# Patient Record
Sex: Female | Born: 1970
Health system: Southern US, Community
[De-identification: ages and names within clinical notes are randomized; demographics above are authoritative.]

## PROBLEM LIST (undated history)

## (undated) DIAGNOSIS — F419 Anxiety disorder, unspecified: Secondary | ICD-10-CM

## (undated) DIAGNOSIS — M797 Fibromyalgia: Secondary | ICD-10-CM

## (undated) DIAGNOSIS — B2790 Infectious mononucleosis, unspecified without complication: Secondary | ICD-10-CM

## (undated) DIAGNOSIS — Z8616 Personal history of COVID-19: Secondary | ICD-10-CM

## (undated) DIAGNOSIS — D849 Immunodeficiency, unspecified: Secondary | ICD-10-CM

## (undated) DIAGNOSIS — Z9889 Other specified postprocedural states: Secondary | ICD-10-CM

## (undated) DIAGNOSIS — N83209 Unspecified ovarian cyst, unspecified side: Secondary | ICD-10-CM

## (undated) DIAGNOSIS — I809 Phlebitis and thrombophlebitis of unspecified site: Secondary | ICD-10-CM

## (undated) DIAGNOSIS — A498 Other bacterial infections of unspecified site: Secondary | ICD-10-CM

## (undated) DIAGNOSIS — A6920 Lyme disease, unspecified: Secondary | ICD-10-CM

## (undated) DIAGNOSIS — J45909 Unspecified asthma, uncomplicated: Secondary | ICD-10-CM

## (undated) DIAGNOSIS — E079 Disorder of thyroid, unspecified: Secondary | ICD-10-CM

## (undated) DIAGNOSIS — N3010 Interstitial cystitis (chronic) without hematuria: Secondary | ICD-10-CM

## (undated) DIAGNOSIS — R011 Cardiac murmur, unspecified: Secondary | ICD-10-CM

## (undated) DIAGNOSIS — R112 Nausea with vomiting, unspecified: Secondary | ICD-10-CM

## (undated) DIAGNOSIS — I10 Essential (primary) hypertension: Secondary | ICD-10-CM

## (undated) DIAGNOSIS — Z77120 Contact with and (suspected) exposure to mold (toxic): Secondary | ICD-10-CM

## (undated) DIAGNOSIS — K219 Gastro-esophageal reflux disease without esophagitis: Secondary | ICD-10-CM

## (undated) DIAGNOSIS — R0602 Shortness of breath: Secondary | ICD-10-CM

## (undated) DIAGNOSIS — I499 Cardiac arrhythmia, unspecified: Secondary | ICD-10-CM

## (undated) DIAGNOSIS — E039 Hypothyroidism, unspecified: Secondary | ICD-10-CM

## (undated) HISTORY — DX: Contact with and (suspected) exposure to mold (toxic): Z77.120

## (undated) HISTORY — DX: Disorder of thyroid, unspecified: E07.9

## (undated) HISTORY — DX: Infectious mononucleosis, unspecified without complication: B27.90

## (undated) HISTORY — DX: Cardiac arrhythmia, unspecified: I49.9

## (undated) HISTORY — DX: Phlebitis and thrombophlebitis of unspecified site: I80.9

## (undated) HISTORY — PX: CHOLECYSTECTOMY: SHX55

## (undated) HISTORY — DX: Unspecified asthma, uncomplicated: J45.909

## (undated) HISTORY — DX: Immunodeficiency, unspecified: D84.9

## (undated) HISTORY — PX: BLADDER SURGERY: SHX569

## (undated) HISTORY — DX: Other bacterial infections of unspecified site: A49.8

## (undated) HISTORY — DX: Cardiac murmur, unspecified: R01.1

## (undated) HISTORY — DX: Anxiety disorder, unspecified: F41.9

## (undated) HISTORY — DX: Essential (primary) hypertension: I10

## (undated) HISTORY — DX: Gastro-esophageal reflux disease without esophagitis: K21.9

## (undated) HISTORY — PX: OTHER SURGICAL HISTORY: SHX169

## (undated) HISTORY — PX: PELVIC LAPAROSCOPY: SHX162

## (undated) HISTORY — DX: Personal history of COVID-19: Z86.16

## (undated) HISTORY — DX: Interstitial cystitis (chronic) without hematuria: N30.10

## (undated) HISTORY — DX: Unspecified ovarian cyst, unspecified side: N83.209

---

## 2000-02-23 ENCOUNTER — Other Ambulatory Visit: Admission: RE | Admit: 2000-02-23 | Discharge: 2000-02-23 | Payer: Self-pay | Admitting: Internal Medicine

## 2000-02-23 ENCOUNTER — Encounter (INDEPENDENT_AMBULATORY_CARE_PROVIDER_SITE_OTHER): Payer: Self-pay | Admitting: Specialist

## 2000-03-03 ENCOUNTER — Emergency Department (HOSPITAL_COMMUNITY): Admission: EM | Admit: 2000-03-03 | Discharge: 2000-03-03 | Payer: Self-pay | Admitting: Emergency Medicine

## 2000-03-03 ENCOUNTER — Encounter: Payer: Self-pay | Admitting: Emergency Medicine

## 2000-03-20 ENCOUNTER — Ambulatory Visit (HOSPITAL_BASED_OUTPATIENT_CLINIC_OR_DEPARTMENT_OTHER): Admission: RE | Admit: 2000-03-20 | Discharge: 2000-03-20 | Payer: Self-pay | Admitting: *Deleted

## 2000-05-21 ENCOUNTER — Encounter: Payer: Self-pay | Admitting: Surgery

## 2000-05-21 ENCOUNTER — Encounter: Admission: RE | Admit: 2000-05-21 | Discharge: 2000-05-21 | Payer: Self-pay | Admitting: Surgery

## 2001-08-26 ENCOUNTER — Ambulatory Visit (HOSPITAL_COMMUNITY): Admission: RE | Admit: 2001-08-26 | Discharge: 2001-08-26 | Payer: Self-pay | Admitting: Oral & Maxillofacial Surgery

## 2001-08-26 ENCOUNTER — Encounter: Payer: Self-pay | Admitting: Oral & Maxillofacial Surgery

## 2001-11-08 ENCOUNTER — Ambulatory Visit (HOSPITAL_BASED_OUTPATIENT_CLINIC_OR_DEPARTMENT_OTHER): Admission: RE | Admit: 2001-11-08 | Discharge: 2001-11-08 | Payer: Self-pay | Admitting: Family Medicine

## 2002-07-30 ENCOUNTER — Other Ambulatory Visit: Admission: RE | Admit: 2002-07-30 | Discharge: 2002-07-30 | Payer: Self-pay | Admitting: Obstetrics and Gynecology

## 2002-08-04 ENCOUNTER — Encounter: Payer: Self-pay | Admitting: Family Medicine

## 2002-08-04 LAB — CONVERTED CEMR LAB

## 2003-03-30 ENCOUNTER — Ambulatory Visit (HOSPITAL_BASED_OUTPATIENT_CLINIC_OR_DEPARTMENT_OTHER): Admission: RE | Admit: 2003-03-30 | Discharge: 2003-03-30 | Payer: Self-pay | Admitting: Otolaryngology

## 2003-03-30 ENCOUNTER — Encounter (INDEPENDENT_AMBULATORY_CARE_PROVIDER_SITE_OTHER): Payer: Self-pay | Admitting: Specialist

## 2004-03-14 ENCOUNTER — Encounter: Payer: Self-pay | Admitting: Internal Medicine

## 2004-12-02 ENCOUNTER — Ambulatory Visit: Payer: Self-pay | Admitting: Internal Medicine

## 2004-12-06 ENCOUNTER — Ambulatory Visit: Payer: Self-pay | Admitting: Internal Medicine

## 2004-12-07 ENCOUNTER — Ambulatory Visit: Payer: Self-pay | Admitting: Family Medicine

## 2004-12-08 ENCOUNTER — Encounter: Admission: RE | Admit: 2004-12-08 | Discharge: 2004-12-08 | Payer: Self-pay | Admitting: Family Medicine

## 2004-12-22 ENCOUNTER — Ambulatory Visit (HOSPITAL_COMMUNITY): Admission: RE | Admit: 2004-12-22 | Discharge: 2004-12-22 | Payer: Self-pay | Admitting: Family Medicine

## 2005-06-26 ENCOUNTER — Ambulatory Visit: Payer: Self-pay | Admitting: Family Medicine

## 2005-08-30 ENCOUNTER — Ambulatory Visit: Payer: Self-pay | Admitting: Internal Medicine

## 2005-09-05 ENCOUNTER — Ambulatory Visit: Payer: Self-pay | Admitting: Internal Medicine

## 2005-10-16 ENCOUNTER — Ambulatory Visit: Payer: Self-pay | Admitting: Internal Medicine

## 2005-11-14 ENCOUNTER — Ambulatory Visit: Payer: Self-pay | Admitting: Family Medicine

## 2005-12-15 ENCOUNTER — Ambulatory Visit: Payer: Self-pay | Admitting: Internal Medicine

## 2005-12-20 ENCOUNTER — Ambulatory Visit: Payer: Self-pay | Admitting: Internal Medicine

## 2006-04-04 ENCOUNTER — Ambulatory Visit: Payer: Self-pay | Admitting: Family Medicine

## 2006-05-03 ENCOUNTER — Ambulatory Visit: Payer: Self-pay | Admitting: Family Medicine

## 2006-05-07 ENCOUNTER — Ambulatory Visit: Payer: Self-pay | Admitting: Family Medicine

## 2006-05-08 ENCOUNTER — Ambulatory Visit: Payer: Self-pay | Admitting: Family Medicine

## 2006-05-22 ENCOUNTER — Ambulatory Visit: Payer: Self-pay | Admitting: Family Medicine

## 2006-07-20 ENCOUNTER — Ambulatory Visit: Payer: Self-pay | Admitting: Family Medicine

## 2006-08-30 ENCOUNTER — Ambulatory Visit: Payer: Self-pay | Admitting: Urology

## 2006-08-31 ENCOUNTER — Ambulatory Visit: Payer: Self-pay | Admitting: Urology

## 2006-09-10 ENCOUNTER — Other Ambulatory Visit: Admission: RE | Admit: 2006-09-10 | Discharge: 2006-09-10 | Payer: Self-pay | Admitting: Obstetrics and Gynecology

## 2006-10-18 ENCOUNTER — Ambulatory Visit (HOSPITAL_COMMUNITY): Admission: RE | Admit: 2006-10-18 | Discharge: 2006-10-18 | Payer: Self-pay | Admitting: Surgery

## 2006-11-30 ENCOUNTER — Encounter: Payer: Self-pay | Admitting: Family Medicine

## 2006-11-30 DIAGNOSIS — IMO0001 Reserved for inherently not codable concepts without codable children: Secondary | ICD-10-CM | POA: Insufficient documentation

## 2006-11-30 DIAGNOSIS — J45909 Unspecified asthma, uncomplicated: Secondary | ICD-10-CM | POA: Insufficient documentation

## 2006-11-30 DIAGNOSIS — B369 Superficial mycosis, unspecified: Secondary | ICD-10-CM | POA: Insufficient documentation

## 2006-11-30 DIAGNOSIS — R519 Headache, unspecified: Secondary | ICD-10-CM | POA: Insufficient documentation

## 2006-11-30 DIAGNOSIS — R5382 Chronic fatigue, unspecified: Secondary | ICD-10-CM | POA: Insufficient documentation

## 2006-11-30 DIAGNOSIS — E785 Hyperlipidemia, unspecified: Secondary | ICD-10-CM | POA: Insufficient documentation

## 2006-11-30 DIAGNOSIS — N3010 Interstitial cystitis (chronic) without hematuria: Secondary | ICD-10-CM | POA: Insufficient documentation

## 2006-11-30 DIAGNOSIS — K589 Irritable bowel syndrome without diarrhea: Secondary | ICD-10-CM | POA: Insufficient documentation

## 2006-11-30 DIAGNOSIS — K219 Gastro-esophageal reflux disease without esophagitis: Secondary | ICD-10-CM | POA: Insufficient documentation

## 2006-11-30 DIAGNOSIS — F410 Panic disorder [episodic paroxysmal anxiety] without agoraphobia: Secondary | ICD-10-CM | POA: Insufficient documentation

## 2006-11-30 DIAGNOSIS — G9332 Myalgic encephalomyelitis/chronic fatigue syndrome: Secondary | ICD-10-CM | POA: Insufficient documentation

## 2006-11-30 DIAGNOSIS — R51 Headache: Secondary | ICD-10-CM | POA: Insufficient documentation

## 2007-03-11 ENCOUNTER — Encounter (INDEPENDENT_AMBULATORY_CARE_PROVIDER_SITE_OTHER): Payer: Self-pay | Admitting: Surgery

## 2007-03-11 ENCOUNTER — Ambulatory Visit (HOSPITAL_COMMUNITY): Admission: RE | Admit: 2007-03-11 | Discharge: 2007-03-12 | Payer: Self-pay | Admitting: Surgery

## 2007-03-11 ENCOUNTER — Encounter: Payer: Self-pay | Admitting: Family Medicine

## 2007-03-19 ENCOUNTER — Telehealth (INDEPENDENT_AMBULATORY_CARE_PROVIDER_SITE_OTHER): Payer: Self-pay | Admitting: *Deleted

## 2007-04-29 ENCOUNTER — Ambulatory Visit: Payer: Self-pay | Admitting: Internal Medicine

## 2007-09-05 HISTORY — PX: TONSILLECTOMY: SUR1361

## 2007-09-30 ENCOUNTER — Other Ambulatory Visit: Admission: RE | Admit: 2007-09-30 | Discharge: 2007-09-30 | Payer: Self-pay | Admitting: Obstetrics and Gynecology

## 2007-12-05 DIAGNOSIS — D126 Benign neoplasm of colon, unspecified: Secondary | ICD-10-CM | POA: Insufficient documentation

## 2008-08-31 ENCOUNTER — Ambulatory Visit (HOSPITAL_BASED_OUTPATIENT_CLINIC_OR_DEPARTMENT_OTHER): Admission: RE | Admit: 2008-08-31 | Discharge: 2008-08-31 | Payer: Self-pay | Admitting: Otolaryngology

## 2008-11-30 ENCOUNTER — Ambulatory Visit: Payer: Self-pay | Admitting: Obstetrics and Gynecology

## 2008-11-30 ENCOUNTER — Encounter: Payer: Self-pay | Admitting: Obstetrics and Gynecology

## 2008-11-30 ENCOUNTER — Other Ambulatory Visit: Admission: RE | Admit: 2008-11-30 | Discharge: 2008-11-30 | Payer: Self-pay | Admitting: Obstetrics and Gynecology

## 2009-01-02 DIAGNOSIS — B2790 Infectious mononucleosis, unspecified without complication: Secondary | ICD-10-CM

## 2009-01-02 HISTORY — DX: Infectious mononucleosis, unspecified without complication: B27.90

## 2009-01-03 ENCOUNTER — Ambulatory Visit: Payer: Self-pay | Admitting: Diagnostic Radiology

## 2009-01-03 ENCOUNTER — Encounter: Payer: Self-pay | Admitting: Emergency Medicine

## 2009-01-04 ENCOUNTER — Inpatient Hospital Stay (HOSPITAL_COMMUNITY): Admission: EM | Admit: 2009-01-04 | Discharge: 2009-01-08 | Payer: Self-pay | Admitting: Internal Medicine

## 2009-01-08 ENCOUNTER — Encounter: Payer: Self-pay | Admitting: Physician Assistant

## 2009-01-22 ENCOUNTER — Encounter: Admission: RE | Admit: 2009-01-22 | Discharge: 2009-01-22 | Payer: Self-pay | Admitting: Internal Medicine

## 2009-02-02 ENCOUNTER — Encounter: Payer: Self-pay | Admitting: *Deleted

## 2009-02-18 ENCOUNTER — Encounter (INDEPENDENT_AMBULATORY_CARE_PROVIDER_SITE_OTHER): Payer: Self-pay | Admitting: *Deleted

## 2009-03-09 ENCOUNTER — Encounter: Payer: Self-pay | Admitting: Physician Assistant

## 2009-03-10 ENCOUNTER — Encounter: Payer: Self-pay | Admitting: Infectious Diseases

## 2009-03-10 DIAGNOSIS — B2790 Infectious mononucleosis, unspecified without complication: Secondary | ICD-10-CM | POA: Insufficient documentation

## 2009-03-10 DIAGNOSIS — R000 Tachycardia, unspecified: Secondary | ICD-10-CM | POA: Insufficient documentation

## 2009-03-11 ENCOUNTER — Encounter: Admission: RE | Admit: 2009-03-11 | Discharge: 2009-03-11 | Payer: Self-pay | Admitting: Allergy

## 2009-04-13 ENCOUNTER — Telehealth: Payer: Self-pay | Admitting: Internal Medicine

## 2009-04-15 ENCOUNTER — Encounter: Payer: Self-pay | Admitting: Internal Medicine

## 2009-04-15 ENCOUNTER — Ambulatory Visit: Payer: Self-pay | Admitting: Gastroenterology

## 2009-04-15 DIAGNOSIS — R1013 Epigastric pain: Secondary | ICD-10-CM | POA: Insufficient documentation

## 2009-04-15 DIAGNOSIS — R110 Nausea: Secondary | ICD-10-CM | POA: Insufficient documentation

## 2009-04-15 LAB — CONVERTED CEMR LAB
Lipase: 14 units/L (ref 11.0–59.0)
Tissue Transglutaminase Ab, IgA: 0.5 units (ref <7)

## 2009-04-19 ENCOUNTER — Ambulatory Visit: Payer: Self-pay | Admitting: Infectious Diseases

## 2009-04-19 LAB — CONVERTED CEMR LAB
ALT: 13 units/L (ref 0–35)
AST: 14 units/L (ref 0–37)
Albumin: 3.6 g/dL (ref 3.5–5.2)
Alkaline Phosphatase: 56 units/L (ref 39–117)
Anti Nuclear Antibody(ANA): NEGATIVE
BUN: 11 mg/dL (ref 6–23)
Basophils Absolute: 0 10*3/uL (ref 0.0–0.1)
Basophils Relative: 0 % (ref 0–1)
CO2: 24 meq/L (ref 19–32)
Calcium: 8.8 mg/dL (ref 8.4–10.5)
Chloride: 107 meq/L (ref 96–112)
Creatinine, Ser: 0.67 mg/dL (ref 0.40–1.20)
EBV NA IgG: 0.24
EBV VCA IgG: 4.08 — ABNORMAL HIGH
EBV VCA IgM: 0.14
Eosinophils Absolute: 0.3 10*3/uL (ref 0.0–0.7)
Eosinophils Relative: 3 % (ref 0–5)
Glucose, Bld: 90 mg/dL (ref 70–99)
HCT: 36.4 % (ref 36.0–46.0)
Hemoglobin: 11.2 g/dL — ABNORMAL LOW (ref 12.0–15.0)
Lymphocytes Relative: 28 % (ref 12–46)
Lymphs Abs: 2.9 10*3/uL (ref 0.7–4.0)
MCHC: 30.8 g/dL (ref 30.0–36.0)
MCV: 82.9 fL (ref >=78.0)
Monocytes Absolute: 0.5 10*3/uL (ref 0.1–1.0)
Monocytes Relative: 5 % (ref 3–12)
Neutro Abs: 6.5 10*3/uL (ref 1.7–7.7)
Neutrophils Relative %: 64 % (ref 43–77)
Platelets: 353 10*3/uL (ref 150–400)
Potassium: 3.8 meq/L (ref 3.5–5.3)
RBC: 4.39 M/uL (ref 3.87–5.11)
RDW: 14.8 % (ref 11.5–15.5)
Sed Rate: 13 mm/hr (ref 0–22)
Sodium: 142 meq/L (ref 135–145)
TSH: 0.892 microintl units/mL (ref 0.350–4.5)
Total Bilirubin: 0.3 mg/dL (ref 0.3–1.2)
Total Protein: 6.9 g/dL (ref 6.0–8.3)
WBC: 10.2 10*3/uL (ref 4.0–10.5)

## 2009-04-22 ENCOUNTER — Ambulatory Visit (HOSPITAL_COMMUNITY): Admission: RE | Admit: 2009-04-22 | Discharge: 2009-04-22 | Payer: Self-pay | Admitting: Gastroenterology

## 2009-04-27 ENCOUNTER — Telehealth: Payer: Self-pay | Admitting: Physician Assistant

## 2009-04-28 ENCOUNTER — Encounter: Payer: Self-pay | Admitting: Physician Assistant

## 2009-04-30 ENCOUNTER — Encounter: Payer: Self-pay | Admitting: Internal Medicine

## 2009-04-30 ENCOUNTER — Telehealth: Payer: Self-pay | Admitting: Internal Medicine

## 2009-04-30 ENCOUNTER — Ambulatory Visit: Payer: Self-pay | Admitting: Internal Medicine

## 2009-05-03 ENCOUNTER — Encounter: Payer: Self-pay | Admitting: Internal Medicine

## 2009-05-04 ENCOUNTER — Telehealth: Payer: Self-pay | Admitting: Internal Medicine

## 2009-05-06 ENCOUNTER — Telehealth (INDEPENDENT_AMBULATORY_CARE_PROVIDER_SITE_OTHER): Payer: Self-pay | Admitting: *Deleted

## 2009-05-12 ENCOUNTER — Ambulatory Visit: Payer: Self-pay | Admitting: Infectious Diseases

## 2009-05-12 DIAGNOSIS — G4700 Insomnia, unspecified: Secondary | ICD-10-CM | POA: Insufficient documentation

## 2009-06-15 ENCOUNTER — Ambulatory Visit: Payer: Self-pay | Admitting: Internal Medicine

## 2009-06-15 DIAGNOSIS — K3184 Gastroparesis: Secondary | ICD-10-CM | POA: Insufficient documentation

## 2009-06-22 ENCOUNTER — Telehealth: Payer: Self-pay | Admitting: Internal Medicine

## 2009-06-30 ENCOUNTER — Telehealth: Payer: Self-pay | Admitting: Internal Medicine

## 2009-07-12 ENCOUNTER — Ambulatory Visit: Payer: Self-pay | Admitting: Urology

## 2009-09-05 ENCOUNTER — Ambulatory Visit: Payer: Self-pay | Admitting: Diagnostic Radiology

## 2009-09-05 ENCOUNTER — Emergency Department (HOSPITAL_BASED_OUTPATIENT_CLINIC_OR_DEPARTMENT_OTHER): Admission: EM | Admit: 2009-09-05 | Discharge: 2009-09-05 | Payer: Self-pay | Admitting: Emergency Medicine

## 2009-09-28 ENCOUNTER — Encounter: Payer: Self-pay | Admitting: Internal Medicine

## 2010-01-05 ENCOUNTER — Encounter: Payer: Self-pay | Admitting: Internal Medicine

## 2010-02-09 ENCOUNTER — Ambulatory Visit: Payer: Self-pay | Admitting: Obstetrics and Gynecology

## 2010-02-09 ENCOUNTER — Other Ambulatory Visit: Admission: RE | Admit: 2010-02-09 | Discharge: 2010-02-09 | Payer: Self-pay | Admitting: Obstetrics and Gynecology

## 2010-02-10 ENCOUNTER — Encounter: Payer: Self-pay | Admitting: Internal Medicine

## 2010-02-16 ENCOUNTER — Ambulatory Visit: Payer: Self-pay | Admitting: Obstetrics and Gynecology

## 2010-05-26 ENCOUNTER — Encounter: Payer: Self-pay | Admitting: Internal Medicine

## 2010-06-22 ENCOUNTER — Ambulatory Visit: Payer: Self-pay | Admitting: Obstetrics and Gynecology

## 2010-08-01 ENCOUNTER — Encounter: Payer: Self-pay | Admitting: Internal Medicine

## 2010-10-04 NOTE — Letter (Signed)
Summary: Recall Colonoscopy Letter  [facility]  [address] [place], [place] [address]   Phone: [phone]  Fax: [phone]      February 18, 2009 MRN: [id number]   JOURDIN GENS [address] [place], [place]  [address]   Dear Ms. WESTERHOLD,   According to your medical record, it is time for you to schedule a Colonoscopy. The American Cancer Society recommends this procedure as a method to detect early colon cancer. Patients with a family history of colon cancer, or a personal history of colon polyps or inflammatory bowel disease are at increased risk.  This letter has beeen generated based on the recommendations made at the time of your procedure. If you feel that in your particular situation this may no longer apply, please contact our office.  Please call our office at [phone] to schedule this appointment or to update your records at your earliest convenience.  Thank you for cooperating with [place] to provide you with the very best care possible.   Sincerely,  Wilhemina Bonito. Marina Goodell, M.D.  [facility] [phone]

## 2010-10-04 NOTE — Op Note (Signed)
Summary: Operative Report  Operative Report   Imported By: Eleonore Chiquito 03/27/2007 14:04:25  _____________________________________________________________________  External Attachment:    Type:   Image     Comment:   External Document

## 2010-10-04 NOTE — Letter (Signed)
Summary: [facility]  GI/[facility]   Imported By: Lester Lajas 06/09/2010 08:40:59  _____________________________________________________________________  External Attachment:    Type:   Image     Comment:   External Document

## 2010-10-04 NOTE — Procedures (Signed)
Summary: Endoscopy   EGD  Procedure date:  04/30/2009  Findings:      Location: [facility]   OPERATIVE PROCEDURE REPORT  PATIENT: [name], [name] MR#: [id number] BIRTHDATE:  [birth year]  GENDER:  female  ENDOSCOPIST:  Wilhemina Bonito. Eda Keys, MD ASSISTANTMarylene Land, RN  PROCEDURE DATE:  04/30/2009  PROCEDURE: EGD with biopsy ASA CLASS:  Class II INDICATIONS: 1) nausea and vomiting ; 2)adbominal pain  MEDICATIONS:  Fentanyl 100 mcg IV, Versed 10 mg IV TOPICAL ANESTHETIC:  Exactacain Spray  DESCRIPTION OF PROCEDURE:   After the risks benefits and alternatives of the procedure were thoroughly explained, informed consent was obtained.  The LB [id number] endoscope was introduced through the mouth and advanced to the second portion of the duodenum, without limitations.  The instrument was slowly withdrawn as the mucosa was fully examined. <<PROCEDUREIMAGES>>          <<OLD IMAGES>>  The upper, middle, and distal third of the esophagus were carefully inspected and no abnormalities were noted. The z-line was well seen at the GEJ. The endoscope was pushed into the fundus which was normal including a retroflexed view. The antrum,gastric body, first and second part of the duodenum were unremarkable except for mild erythema of the distal antrum. Bx taken..    Retroflexed views revealed no abnormalities.    The scope was then withdrawn from the patient and the procedure terminated.  COMPLICATIONS:  None  IMPRESSION: 1) Normal EGD  2) Gerd  3) Distal gastritis 4) Gastroparesis  RECOMMENDATIONS: 1) continue current meds 2) Refill phenergan 25mg ; #60; 1/2 - 1 po q6 hrs prn (6 refills) 3) Refill zofran 4mg  ; #90 ; 1 po q 4 hrs prn; (6 refills) 4) Small meals 5) Office follow up in 6-8 weeks   _______________________________ Wilhemina Bonito. Eda Keys, MD      CC: Beth Murphy. Renne Crigler, M.D.; The Patient     REPORT OF SURGICAL PATHOLOGY   Case #: [id number] Patient Name:  [name], [name]. Office Chart Number:  N/A [id number] MRN: [id number] Pathologist: Ferd Hibbs. Colonel Bald, MD DOB/Age  May 30, 1971 (Age: 40)    Gender: F Date Taken:  04/30/2009 Date Received: 04/30/2009   FINAL DIAGNOSIS   ***MICROSCOPIC EXAMINATION AND DIAGNOSIS***   STOMACH, BIOPSY: - REACTIVE GASTROPATHY.   - THERE IS NO EVIDENCE OF "EOSINOPHILIC GASTRITIS", HELICOBACTER PYLORI, INTESTINAL  METAPLASIA, DYSPLASIA, OR MALIGNANCY. - SEE COMMENT.    COMMENT This pattern can be associated with nonsteroidal anti-inflammatory drugs (NSAIDS), alcohol or with other causes of chemical gastropathy.  Helicobacter pylori are not identified with Warthin-Starry stain. The control stained appropriately.   gdt Date Reported:  05/03/2009     Ferd Hibbs. Colonel Bald, MD *** Electronically Signed Out By [facility] ***    May 03, 2009 MRN: [id number]   [name] [address] [place], [place]  [address]   [name],  I am pleased to inform you that the biopsies taken during your recent endoscopic examination did not show infection or any significant problem with the lining of the stomach  Additional information/recommendations:    __ Continue with the treatment plan as outlined on the day of your      exam.     Please call us if you are having persistent problems or have questions about your condition that have not been fully answered at this time.  Sincerely,  Hilarie Fredrickson MD  This letter has been electronically signed by your physician. This report was created from  the original endoscopy report, which was reviewed and signed by the above listed endoscopist.

## 2010-10-04 NOTE — Progress Notes (Signed)
Summary: Test results   Phone Note Call from Patient Call back at Home Phone [phone]   Call For: DR Kenon Delashmit Reason for Call: Lab or Test Results Summary of Call: Had procedure last week. Is sick-wonders if we have any results or advice so she can feel better. Initial call taken by: Leanor Kail [facility],  May 04, 2009 1:10 PM  Follow-up for Phone Call        Still has burning pain in epigastric area and remains very nauseated. Asking if symptoms related to gastroparesis and what med. canshe take? Follow-up by: Teryl Lucy RN,  May 04, 2009 2:19 PM  Additional Follow-up for Phone Call Additional follow up Details #1::        Biopsies were negative. I sent a letter out yesterday. She is on a number of medications as outlined on her endoscopy report. She is to continue on those medications and followup in 6-8 weeks. She is to use smaller meals more frequently. No other therapy is advised at this time. Additional Follow-up by: Hilarie Fredrickson MD,  May 04, 2009 2:44 PM    Additional Follow-up for Phone Call Additional follow up Details #2::    Discussed Dr.Shulamit Donofrio's recommedations with pt. Follow-up by: Teryl Lucy RN,  May 04, 2009 3:07 PM

## 2010-10-04 NOTE — Procedures (Signed)
Summary: Gastroenterology Colon  Gastroenterology [facility]   Imported By: Christie Nottingham 12/06/2007 08:31:24  _____________________________________________________________________  External Attachment:    Type:   Image     Comment:   External Document

## 2010-10-04 NOTE — Assessment & Plan Note (Signed)
Summary: FOLLOWUP AFTER EGD    History of Present Illness Visit Type: Initial Visit Primary GI MD: Yancey Flemings MD Primary Provider: Dory Peru Requesting Provider: n/a Chief Complaint: Patient following up after EGD. She is following the gastroparesis diet and it is helping but still having severe diarrhea and nausea. States that she is having anywhere from 6-15 loose stools per day, she can not take her meds for the diarrhea becuase it is slow moving and her gastroparesis will not allow them to work.  History of Present Illness:   40 her old white female with diarrhea predominant irritable bowel syndrome, GERD, hyperlipidemia, fibromyalgia, anxiety/depression, exercise-induced asthma, and prior cholecystectomy. She is accompanied by her mother.She was seen by the GI physician assistant 2 months ago for abdominal pain and nausea. Laboratory workup was negative. Gastric emptying scan was performed August 19. This revealed 93% retention at one hour and 72% retention at 2 hours. Upper endoscopy was performed August 27. This was normal. Mild erythema of the gastric antrum noted. Biopsies were obtained and revealed no significant pathologic abnormality she was continued on proton pump inhibitor as well as p.r.n. Phenergan and p.r.n. Zofran. Small more frequent meals recommended. Follow up at this time scheduled. Because of the gastroparesis, her Levbid was held. She reports worsening diarrhea since. She's had no vomiting since last March. She continues with intermittent nausea. She is accompanied by her mother.   GI Review of Systems    Reports abdominal pain, acid reflux, bloating, and  nausea.     Location of  Abdominal pain: generalized.    Denies belching, chest pain, dysphagia with liquids, dysphagia with solids, heartburn, loss of appetite, vomiting, vomiting blood, weight loss, and  weight gain.      Reports diarrhea and  irritable bowel syndrome.     Denies anal fissure, black tarry  stools, change in bowel habit, constipation, diverticulosis, fecal incontinence, heme positive stool, hemorrhoids, jaundice, light color stool, liver problems, rectal bleeding, and  rectal pain.    Current Medications (verified): 1)  Lexapro 10 Mg Tabs (Escitalopram Oxalate) .... One Tablet By Mouth Once Daily 2)  Yasmin 28 3-0.03 Mg Tabs (Drospirenone-Ethinyl Estradiol) .... As Directed 3)  Nexium 40 Mg Cpdr (Esomeprazole Magnesium) .... One Tablet By Mouth Two Times A Day 4)  Zyrtec Allergy 10 Mg Caps (Cetirizine Hcl) .... One Tablet By Mouth Once Daily 5)  Vitamin D3 400 Unit Tabs (Cholecalciferol) .... One Tablet By Mouth Two Times A Day 6)  Probiotic Formula  Caps (Probiotic Product) .... One Tablet By Mouth Once Daily 7)  Flexeril 10 Mg Tabs (Cyclobenzaprine Hcl) .... One Tablet By Mouth At Bedtime 8)  Xanax Xr 0.5 Mg Xr24h-Tab (Alprazolam) .... Two Times A Day 9)  Transderm-Scop 1.5 Mg Pt72 (Scopolamine Base) .... Change Every 72 Hours 10)  Promethazine Hcl 25 Mg Tabs (Promethazine Hcl) .... Take 1/2 To 1 Tab Every 6 Hours As Needed For Nausea 11)  Promethazine Hcl 25 Mg Tabs (Promethazine Hcl) .... 1/2 - 1 By Mouth Q6h As Needed 12)  Zofran 4 Mg Tabs (Ondansetron Hcl) .Marland Kitchen.. 1 By Mouth Q4h As Needed 13)  Coats Aloe Vera  Liqd (Aloe) 14)  Ginger 500 Mg Caps (Ginger (Zingiber Officinalis)) .... Take 3 Tabs By Mouth Once Daily 15)  L-Arginine  Powd (Arginine) .... Mix 5 Mg Powder in Water Drink Once A Day 16)  Tylenol 325 Mg Tabs (Acetaminophen) .... Take 4-6 Per Day As Needed 17)  Trimethoprim 100 Mg Tabs (Trimethoprim) .Marland KitchenMarland KitchenMarland Kitchen  Take One By Mouth Once Daily  Allergies (verified): 1)  ! Vancomycin 2)  ! Penicillin 3)  ! Sulfa 4)  ! Doxycycline 5)  ! * Tamiflu 6)  ! Benadryl 7)  ! Morphine  Past History:  Past Medical History: Asthma- exercise induced INFECTIOUS MONONUCLEOSIS/HOSPITALIZED 5/10 WITH PNEUMONIA AND HEPATITIS HX LYME DISEASE ADENOMATOUS COLON POLYPS(LAST COLON 2008  DRORR -REPORT REQUESTED) GERD IBS Headache Hyperlipidemia note-hx of panic disorder, not hospitalized (that was an error) Anemia Fibromyalgia Irritable Bowel Syndrome Urinary Tract Infection  Past Surgical History: Reviewed history from 04/15/2009 and no changes required. Ovarian cyst- drained (1990) Dysplastic nevus removal (03/1999) Pilonidal cyst removal (03/2000) Colonoscopy-ADENOMATOUS  polyp (02/2000) Colonoscopy- neg (03/2004)  EGD- GERD (09/2005) ? ovarian cyst (08/2006) CCY 7/08 Tonsillectomy 08/31/09 Cholecystectomy7/08  Family History: skin ca in grandparents Family History of Stomach Cancer:great grandfather paternal  No FH of Colon Cancer Family History of Diabetes: mother Family History of Prostate Cancer: paternal grandfather Family History of Breast Cancer:paternal grandmother, maternal aunt Family History of Pancreatic Cancer:maternal grandmother Family History of Heart Disease: paternal grandfathermaternal grandmother Family History of Irritable Bowel Syndrome:mother  Social History: Reviewed history from 04/15/2009 and no changes required. 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  Alcohol Use - no Illicit Drug Use - no Patient gets regular exercise.  Vital Signs:  Patient profile:   40 year old female Height:      60 inches Weight:      179 pounds Pulse rate:   140 / minute Pulse rhythm:   regular BP sitting:   150 / 90  (right arm) Cuff size:   regular  Vitals Entered By: Harlow Mares CMA Duncan Dull) (June 15, 2009 2:21 PM)  Physical Exam  General:  Well developed, well nourished, no acute distress. Head:  Normocephalic and atraumatic. Eyes:  PERRLA, no icterus. Nose:  No deformity, discharge,  or lesions. Mouth:  No deformity or lesions, dentition normal. Lungs:  Clear throughout to auscultation. Heart:  Regular rate and rhythm; no murmurs, rubs,  or bruits. Abdomen:  obese,Soft, nontender and nondistended. No masses,  hepatosplenomegaly or hernias noted. Normal bowel sounds.no succussion splash Pulses:  regular and 96 Extremities:  no edema Neurologic:  Alert and  oriented x4;  grossly normal neurologically. Skin:  Intact without significant lesions or rashes. Psych:  Alert and cooperative. Normal mood and affect.   Impression & Recommendations:  Problem # 1:  NAUSEA (ICD-787.02) Chronic nausea without vomiting or weight loss. Abnormal solid-phase gastric emptying scan consistent with gastroparesis. Etiology may be medication related or idiopathic or combination. Possible functional overlay contributing to nausea as well.  Plan: #1. Continue antiemetics p.r.n. Phenergan refilled per her request excellent  #2. Continue smaller more frequent meals #3. Offer the opportunity to be seen at [facility], division of gastroenterology, motility disorder section. She is interested. We will refer to Dr. Iantha Fallen Koch/colleagues  Problem # 2:  GERD (ICD-530.81) ongoing. Continue PPI therapy, reflux precautions with attention to weight loss.Negative endoscopy as noted  Problem # 3:  Hx of IRRITABLE BOWEL SYNDROME (ICD-564.1) diarrhea predominant irritable bowel syndrome. Prior history of postcholecystectomy diarrhea. Plan to treat her with Colestid 1 g b.i.d. This has worked in the past.  Other Orders: [facility])  Patient Instructions: 1)  Rx. for Colestid 1 gram by mouth two times a day #60 x 11 RFs. 2)  Rx. for Phenergan 25 mg 1 q 6 hours as needed #60 NRFs. 3)  Nexium samples given to  patient to take 1 by mouth once daily 4)  Will make referral to [facility] Dr. Gerri Lins  for Gastroparesis. 5)  The medication list was reviewed and reconciled.  All changed / newly prescribed medications were explained.  A complete medication list was provided to the patient / caregiver. 6)  Copy: Dr. Merri Brunette, Dr. Beverly Gust Prescriptions: PROMETHAZINE HCL 25 MG TABS  (PROMETHAZINE HCL) 1 tablet by mouth q 6 hours as needed  #60 x 0   Entered by:   Milford Cage NCMA   Authorized by:   Hilarie Fredrickson MD   Signed by:   Milford Cage NCMA on 06/15/2009   Method used:   Electronically to        [facility]  [address] (retail)       [address]       [place], [place]  [address]       Ph: [phone] or [phone]       Fax: [phone]   RxID:   [id number] COLESTID 1 GM TABS (COLESTIPOL HCL) 1 tablet by mouth two times a day  #60 x 11   Entered by:   Milford Cage NCMA   Authorized by:   Hilarie Fredrickson MD   Signed by:   Milford Cage NCMA on 06/15/2009   Method used:   [facility]  [id number] [facility]       [address]       [place], [place]  [address]       Ph: [phone] or [phone]       Fax: [phone]   RxID:   [id number]   Appended Document: Orders Update    Clinical Lists Changes  Orders: Added new Test order of [facility]) - Signed

## 2010-10-04 NOTE — Consult Note (Signed)
Summary: G[facility]enterology/[facility]  Gastroenterology[facility]   Imported By: Lester Brown Deer 01/28/2010 08:37:02  _____________________________________________________________________  External Attachment:    Type:   Image     Comment:   External Document

## 2010-10-04 NOTE — Assessment & Plan Note (Signed)
Summary: 3 week checkup/ch   Referring Provider:  n/a Primary Provider:  Dory Peru  CC:  Headache X 1 week  no relied with tylenol  and having problems falling asleep  at night   2. Ov with Dr Marina Goodell 8-27 .  History of Present Illness: 40 yo F with hx of hospitalization in May 2010 with hepatitis, thrombocytopenia and was diagnosed with acute mono. She had monospot+, CMV IgG/M -, RMSF -. BCx -.  She had a UTI with Kleb pneumonia as well as CXR consistent with atypical pneumonia. She was treated with levaquin.   States she had Lyme in 2007 which went on forever because they wouldn't  test for it. Was eventually treated with amoxicillin. Contracted from being in [place] 8-07.  2009 had multiple illnesses which culminated in tonsilectomy 08-31-08.   See in ID clinic in August with non-specific syndrome- mild temp, ? night sweats, myalgias, fatigue.  All lab testing negative. Had EGD showing non-specific gastritis, also swallowing study showing gastroparesis.  Cont to have bad headache and difficulty sleeping. still having low grade temps (99.5) occas night sweats but improving.   Preventive Screening-Counseling & Management  Alcohol-Tobacco     Alcohol drinks/day: 0     Smoking Status: never   Updated Prior Medication List: LEXAPRO 10 MG TABS (ESCITALOPRAM OXALATE) one tablet by mouth once daily YASMIN 28 3-0.03 MG TABS (DROSPIRENONE-ETHINYL ESTRADIOL) as directed NEXIUM 40 MG CPDR (ESOMEPRAZOLE MAGNESIUM) one tablet by mouth two times a day ZYRTEC ALLERGY 10 MG CAPS (CETIRIZINE HCL) one tablet by mouth once daily VITAMIN D3 400 UNIT TABS (CHOLECALCIFEROL) one tablet by mouth two times a day PROBIOTIC FORMULA  CAPS (PROBIOTIC PRODUCT) one tablet by mouth once daily FLEXERIL 10 MG TABS (CYCLOBENZAPRINE HCL) one tablet by mouth at bedtime XANAX XR 0.5 MG XR24H-TAB (ALPRAZOLAM) two times a day TRANSDERM-SCOP 1.5 MG PT72 (SCOPOLAMINE BASE) Change every 72 hours PROMETHAZINE HCL 25 MG  TABS (PROMETHAZINE HCL) Take 1/2 to 1 tab every 6 hours as needed for nausea PROMETHAZINE HCL 25 MG TABS (PROMETHAZINE HCL) 1/2 - 1 by mouth q6h as needed ZOFRAN 4 MG TABS (ONDANSETRON HCL) 1 by mouth q4h as needed COATS ALOE VERA  LIQD (ALOE)   Current Allergies: ! VANCOMYCIN ! PENICILLIN ! SULFA ! DOXYCYCLINE ! * TAMIFLU ! BENADRYL Past History:  Past medical, surgical, family and social histories (including risk factors) reviewed, and no changes noted (except as noted below).  Past Medical History: Reviewed history from 04/15/2009 and no changes required. Asthma- exercisE induced INFECTIOUS MONONUCLEOSIS/HOSPITALIZED 5/10 WITH PNEUMONIA AND HEPATITIS HX LYME DISEASE ADENOMATOUS COLON POLYPS(LAST COLON 2008 DRORR -REPORT REQUESTED) GERD IBS Headache Hyperlipidemia note-hx of panic disorder, not hospitalized (that was an error)  Past Surgical History: Reviewed history from 04/15/2009 and no changes required. Ovarian cyst- drained (1990) Dysplastic nevus removal (03/1999) Pilonidal cyst removal (03/2000) Colonoscopy-ADENOMATOUS  polyp (02/2000) Colonoscopy- neg (03/2004)  EGD- GERD (09/2005) ? ovarian cyst (08/2006) CCY 7/08 Tonsillectomy 08/31/09 Cholecystectomy7/08  Family History: Reviewed history from 04/15/2009 and no changes required. skin ca in grandparents Family History of Stomach Cancer:great grandfather No FH of Colon Cancer: Family History of Diabetes: mother  Social History: Reviewed history from 04/15/2009 and no changes required. 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  Alcohol Use - no Illicit Drug Use - no Patient gets regular exercise.  Review of Systems       recently had diflucan for thrush in mouth. completed 1 week ago. lost 6 #. now on soft-diet.  Vital Signs:  Patient profile:   40 year old female Height:      60 inches Weight:      182.3 pounds BMI:     35.73 BSA:     1.80 Temp:     97.6 degrees F oral Pulse rate:    128 / minute BP sitting:   141 / 98  (left arm)  Vitals Entered By: Tomasita Morrow RN (May 12, 2009 11:29 AM) CC: Headache X 1 week  no relied with tylenol , having problems falling asleep  at night   2. Ov with Dr Marina Goodell 8-27  Pain Assessment Patient in pain? yes     Location: headache  Intensity: 7 Type: aching Onset of pain  X 2 weeks  Nutritional Status BMI of > 30 = obese Nutritional Status Detail soft liquid diet   Have you ever been in a relationship where you felt threatened, hurt or afraid?No  Domestic Violence Intervention none  Does patient need assistance? Functional Status Self care Ambulation Normal   Physical Exam  General:  well-developed, well-nourished, and well-hydrated.   Eyes:  pupils equal, pupils round, and pupils reactive to light.   Mouth:  pharynx pink and moist and no exudates.   Neck:  no masses.   Lungs:  normal respiratory effort and normal breath sounds.   Heart:  normal rate, regular rhythm, and no murmur.   Abdomen:  mild diffuse tenderness. soft, normal bowel sounds, no guarding, and no rebound tenderness.     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 her other serologies are negative. have asked her to call if she has temps . 100.8 and will have her back prn .   Orders: Est. Patient Level IV ([address])  Problem # 2:  INSOMNIA (ICD-780.52)  has tried [place]. will give her a trial of resotril.  Her updated medication list for this problem includes:    Restoril 15 Mg Caps (Temazepam) .Marland Kitchen... At bedtime as needed insomnia  Orders: Est. Patient Level IV ([address])  Medications Added to Medication List This Visit: 1)  Xanax Xr 0.5 Mg Xr24h-tab (Alprazolam) .... Two times a day 2)  Coats Aloe Vera Liqd (Aloe) 3)  Restoril 15 Mg Caps (Temazepam) .... At bedtime as needed insomnia Prescriptions: RESTORIL 15 MG  CAPS (TEMAZEPAM) at bedtime as needed insomnia  #20 x 1   Entered and Authorized by:   Johny Sax MD   Signed by:   Johny Sax MD on 05/12/2009   Method used:   Print then Give to Patient   RxID:   [id number]

## 2010-10-04 NOTE — Letter (Signed)
Summary: Discharge Summary  Discharge Summary   Imported By: Lowry Ram [facility] 04/16/2009 08:01:04  _____________________________________________________________________  External Attachment:    Type:   Image     Comment:   External Document

## 2010-10-04 NOTE — Progress Notes (Signed)
Summary: results   Phone Note Call from Patient Call back at Home Phone [phone]   Caller: Patient Call For: perry Reason for Call: Talk to Nurse, Lab or Test Results Summary of Call: Patient wants test results form last week Initial call taken by: Tawni Levy,  April 27, 2009 3:18 PM  Follow-up for Phone Call        Gave pt Gastric Emptying scan results.  Pt is not feeling well at all.  Her epigstric area is sore.  She is nauseous .  Asked for [place] to refill her promethazine 25 mg.  Can we do this?  She is ready for her EGD on Fri 04-30-09.  Follow-up by: Joselyn Glassman,  April 28, 2009 9:08 AM  Additional Follow-up for Phone Call Additional follow up Details #1::        Yes we can refill the phenergan 12.5-25 mg q 6 hours as needed.#50/0 refill Additional Follow-up by: Sammuel Cooper PA-c,  April 28, 2009 11:28 AM

## 2010-10-04 NOTE — Miscellaneous (Signed)
Summary: refill phenergan and zofran  Clinical Lists Changes  Medications: Added new medication of PROMETHAZINE HCL 25 MG TABS (PROMETHAZINE HCL) 1/2 - 1 by mouth q6h as needed - Signed Added new medication of ZOFRAN 4 MG TABS (ONDANSETRON HCL) 1 by mouth q4h as needed - Signed Rx of PROMETHAZINE HCL 25 MG TABS (PROMETHAZINE HCL) 1/2 - 1 by mouth q6h as needed;  #60 x 6;  Signed;  Entered by: Eual Fines RN;  Authorized by: Hilarie Fredrickson MD;  Method used: [facility]  [address] [place], [place]  [address], Ph: [phone] or [phone], Fax: [phone] Rx of ZOFRAN 4 MG TABS (ONDANSETRON HCL) 1 by mouth q4h as needed;  #90 x 6;  Signed;  Entered by: Eual Fines RN;  Authorized by: Hilarie Fredrickson MD;  Method used: [facility]ically [facility]  [address], [place], [place]  [address], Ph: [phone] or [phone], Fax: [phone] Observations: Added new observation of ALLERGY REV: Done (04/30/2009 9:23)    Prescriptions: ZOFRAN 4 MG TABS (ONDANSETRON HCL) 1 by mouth q4h as needed  #90 x 6   Entered by:   Eual Fines RN   Authorized by:   Hilarie Fredrickson MD   Signed by:   Eual Fines RN on 04/30/2009   Method used:   Electronically to        [facility] (retail)       [address]       [place], [place]  [address]       Ph: [phone] or [phone]       Fax: [phone]   RxID:   [id number] PROMETHAZINE HCL 25 MG TABS (PROMETHAZINE HCL) 1/2 - 1 by mouth q6h as needed  #60 x 6   Entered by:   Eual Fines RN   Authorized by:   Hilarie Fredrickson MD   Signed by:   Eual Fines RN on 04/30/2009   Method used:   Electronically to        [facility] (retail)       [address]       [place], [place]  [address]       Ph: [phone] or [phone]       Fax: [phone]   RxID:   [id number]

## 2010-10-04 NOTE — Procedures (Signed)
Summary: Gastroenterology EGD  Gastroenterology EGD   Imported By: Christie Nottingham 12/06/2007 08:31:55  _____________________________________________________________________  External Attachment:    Type:   Image     Comment:   External Document

## 2010-10-04 NOTE — Miscellaneous (Signed)
Summary: RX Promethazine  Clinical Lists Changes  Medications: Added new medication of PROMETHAZINE HCL 25 MG TABS (PROMETHAZINE HCL) Take 1/2 to 1 tab every 6 hours as needed for nausea - Signed Rx of PROMETHAZINE HCL 25 MG TABS (PROMETHAZINE HCL) Take 1/2 to 1 tab every 6 hours as needed for nausea;  #50 x 0;  Signed;  Entered by: Lowry Ram NCMA;  Authorized by: Sammuel Cooper PA-c;  Method used: [facility]ically [facility]  [address], [place], [place]  [address], Ph: [phone] or [phone], Fax: [phone]    Prescriptions: PROMETHAZINE HCL 25 MG TABS (PROMETHAZINE HCL) Take 1/2 to 1 tab every 6 hours as needed for nausea  #50 x 0   Entered by:   Lowry Ram NCMA   Authorized by:   Sammuel Cooper PA-c   Signed by:   Lowry Ram NCMA on 04/28/2009   Method used:   Electronically to        [facility] (retail)       [address]       [place], [place]  [address]       Ph: [phone] or [phone]       Fax: [phone]   RxID:   [id number]

## 2010-10-04 NOTE — Progress Notes (Signed)
Summary: ? re appt   Phone Note Call from Patient Call back at [phone]   Caller: Patient Call For: Marina Goodell Reason for Call: Talk to Nurse Summary of Call: Patient wants to know status on appt at [facility] Initial call taken by: Tawni Levy,  June 22, 2009 3:43 PM  Follow-up for Phone Call         message left for pt. that info was sent on 06/16/09 and they do not have Jan.2011 schedule yet and they will call us and her when appt. is made. Follow-up by: Teryl Lucy RN,  June 23, 2009 8:31 AM

## 2010-10-04 NOTE — Assessment & Plan Note (Signed)
Summary: nausea/abd.pain    History of Present Illness Visit Type: Follow-up Visit Primary GI MD: Yancey Flemings MD Primary Provider: Dory Peru Requesting Provider: n/a Chief Complaint: abdominal pain with nausea neg for vomiting but is taking phenergan History of Present Illness:   40 YO FEMALE KNOWN TO DR Marina Goodell WITH HX OF GERD AND IBS. SHE IS S/P CHOLECYSTECTOMY7/08.SHE ALSO HAS HX OF ADENOMATOUS COLON POLYPS/2000; SHE HAD FOLLOW UP COLONOSCOPY IN 2005 WHICH  WAS NORMAL.SHE WAS IN A DRUG STUDY THROUGH DR ORRS OFFICE  FOR IBS AND HAD ACOLONOSCOPY AS PART OF THAT IN 2008 REPORTED AS NORMAL.SHE HAS HAD A ROUGH YEAR. SHE STARTED WITH SEVERAL THROAT INFECTIONS IN THE FALL,ENDED UP HAVING A TONSILLECTOMY 12/09. SHE SAYS SHE NEVER REALLY GO T COMPLETELY BETTER AFTER THAT AND WAS HOSPITALIZED IN 5/10 WITH MONO,ASSOCIATED WITH AN ATYPICAL PNEUMONIA AND HEPATITS, SHE HAS ALSO HAD A PERSISTENT TACHYCARDIA WITH THE ILLNESS. SHE HAD A UTI DURING THAT ADMIT AS WELL. SINCE PRIOR TO THE DIAGNOSIS OF MONO SHE DEVELOPED NAUSEA WHICH HAS PROGRESSIVELY WORSENED TO THE POINT SHE HAS BEEN NAUSEATED 24 HOURS A DAY AND HAVING REGULAR VOMITING,AND INCREASED REFLUX SXS. NO WT LOSS. SHE HAS SUBXYPHOID DISCOMFORT,PRESSURE,NO DYSHPHAGIA OR ODYNOPHAGIA. SHE HAS BEEN ON NEXIUM 40 MG TWICE DAILY OVER THE PAST WEEK,USING MYLANTA AS NEEDED,AND PHENERGAN AND A SCOPALAMINE PATCH.Marland KitchenWITH ADDITION OF THIS REGIMEN SHE IS NOT VOMITING. NO REAL EARLY SATIETY SXS.HAD BEEN ON RELAFEN A WHILE BACK WHICH WAS STOPPED,NO OTHER NSAIDS.   GI Review of Systems    Reports abdominal pain, acid reflux, bloating, heartburn, nausea, and  vomiting.     Location of  Abdominal pain: epigastric area.    Denies chest pain, dysphagia with liquids, dysphagia with solids, loss of appetite, vomiting blood, and  weight loss.      Reports irritable bowel syndrome.     Denies anal fissure, black tarry stools, change in bowel habit, constipation, diarrhea,  diverticulosis, fecal incontinence, heme positive stool, hemorrhoids, jaundice, light color stool, liver problems, rectal bleeding, and  rectal pain.    Current Medications (verified): 1)  Levbid 0.375 Mg  Tb12 (Hyoscyamine Sulfate) .Marland Kitchen.. 1 By Mouth Two Times A Day As Needed Abd Cramping 2)  Lexapro 10 Mg Tabs (Escitalopram Oxalate) .... One Tablet By Mouth Once Daily 3)  Ativan(Dosage Unknown) .... Three Tablets By Mouth Daily 4)  Yasmin 28 3-0.03 Mg Tabs (Drospirenone-Ethinyl Estradiol) .... As Directed 5)  Nexium 40 Mg Cpdr (Esomeprazole Magnesium) .... One Tablet By Mouth Two Times A Day 6)  Zyrtec Allergy 10 Mg Caps (Cetirizine Hcl) .... One Tablet By Mouth Once Daily 7)  Vitamin D3 400 Unit Tabs (Cholecalciferol) .... One Tablet By Mouth Two Times A Day 8)  Probiotic Formula  Caps (Probiotic Product) .... One Tablet By Mouth Once Daily 9)  Flexeril 10 Mg Tabs (Cyclobenzaprine Hcl) .... One Tablet By Mouth At Bedtime 10)  Promethazine Hcl 25 Mg Tabs (Promethazine Hcl) .... One Tablet By Mouth Two Times A Day 11)  Xanax 1 Mg Tabs (Alprazolam) .... One Tablet By Mouth At Bedtime 12)  Transderm-Scop 1.5 Mg Pt72 (Scopolamine Base) .... Change Every 72 Hours  Allergies (verified): 1)  ! Vancomycin 2)  ! Penicillin 3)  ! Sulfa 4)  ! Doxycycline 5)  ! * Tamiflu 6)  ! Benadryl  Past History:  Past Medical History: Asthma- exercisE induced INFECTIOUS MONONUCLEOSIS/HOSPITALIZED 5/10 WITH PNEUMONIA AND HEPATITIS HX LYME DISEASE ADENOMATOUS COLON POLYPS(LAST COLON 2008 DR[facility]R -REPORT REQUESTED) GERD IBS Headache Hyperlipidemia note-hx  of panic disorder, not hospitalized (that was an error)  Past Surgical History: Ovarian cyst- drained (1990) Dysplastic nevus removal (03/1999) Pilonidal cyst removal (03/2000) Colonoscopy-ADENOMATOUS  polyp (02/2000) Colonoscopy- neg (03/2004)  EGD- GERD (09/2005) ? ovarian cyst (08/2006) CCY 7/08 Tonsillectomy 08/31/09 Cholecystectomy7/08   Family History: skin ca in grandparents Family History of Stomach Cancer:great grandfather No FH of Colon Cancer: Family History of Diabetes: mother  Social History: Occupation: Runner, broadcasting/film/video Patient has never smoked.  Alcohol Use - no Illicit Drug Use - no Patient gets regular exercise. Drug Use:  no Does Patient Exercise:  yes  Review of Systems       The patient complains of allergy/sinus, arthritis/joint pain, fatigue, fever, shortness of breath, sleeping problems, sore throat, and urination changes/pain.  The patient denies anemia, anxiety-new, back pain, blood in urine, breast changes/lumps, change in vision, confusion, cough, coughing up blood, depression-new, fainting, headaches-new, hearing problems, heart murmur, heart rhythm changes, itching, menstrual pain, muscle pains/cramps, night sweats, nosebleeds, pregnancy symptoms, skin rash, swelling of feet/legs, swollen lymph glands, thirst - excessive , urination - excessive , urine leakage, vision changes, and voice change.         UTI  Vital Signs:  Patient profile:   40 year old female Height:      60 inches Weight:      182 pounds BMI:     35.67 BSA:     1.79 Pulse rate:   120 / minute Pulse rhythm:   regular BP sitting:   100 / 80  (left arm)  Vitals Entered By: Merri Ray CMA (AAMA) (April 15, 2009 1:33 PM)  Physical Exam  General:  Well developed, well nourished, no acute distress. Head:  Normocephalic and atraumatic. Eyes:  PERRLA, no icterus. Lungs:  Clear throughout to auscultation. Heart:  Regular rate and rhythm; no murmurs, rubs,  or bruits. Abdomen:  SOFT,MILD TENDERNESSEPIGASTRIUM,AND OVER SPLENIC TIP,NO MASS OR HSM FELT,BS+ Rectal:  NOT DONE Msk:  TENDER OVER LEFT LOWE RIB MARGIN Extremities:  No clubbing, cyanosis, edema or deformities noted. Neurologic:  Alert and  oriented x4;  grossly normal neurologically. Psych:  Alert and cooperative. Normal mood and affect.   Impression &  Recommendations:  Problem # 1:  NAUSEA (ICD-787.02) Assessment New 40 YO FEMALE WITH SEVERAL MONTH HX OF NAUSEA,INTERMITTENT VOMITING,,EPIGASTRIC DISCOMFORT SUSPECT A POST VIRAL GASTROPARESIS,R/O GASTRITIS,PUD,EXACERBATION OF GERD  CONTINUE TWICE DAILY NEXIUM STOP EVENING DOSE OF PRILOSEC CONTINUE PHENERGAN AND SCOPALAMINE TRIAL OF STEP3  GASTROPARESIS DIET SCHEDULE FOR GASTRIC EMPTYING SCAN SCHEDULE FOR EGD WITH DR PERRY PT HAS AN APPT WITH DR HATCHER FOR ID CONSULT NEXT WEEK AS WELL. CELIAC PANEL,LIPASE ,OTHER LABS NORMAL 7/10. Orders: T-Sprue Panel (Celiac Disease Aby Eval) (83516x3/[address]) EGD (EGD) TLB-Lipase (83690-LIPASE)  Problem # 2:  ADENOMATOUS COLONIC POLYP (ICD-211.3) Assessment: Comment Only WILL OBTAIN DR ORRS REPORT FROM 2008,CONSIDER FOLLOW UP AT FIVE YEAR INTERVAL.  Problem # 3:  GERD (ICD-530.81) Assessment: Comment Only  Orders: T-Sprue Panel (Celiac Disease Aby Eval) (83516x3/[id number]) EGD (EGD) TLB-Lipase (83690-LIPASE)  Patient Instructions: 1)  Please go to our basement level lab. 2)  We have scheduled the Gastric Emptying scan for 04-22-09 at 9AM.  3)  Directions provided. 4)  Follow the Gastroparesis Step 3 diet. 5)  We scheduled the Endo with Dr. Marina Goodell for 04-30-09 at [facility]. 6)  Endoscopy and conscous sedation brochure provided. 7)  Copy sent to : Merri [place], MD 8)  The medication list was reviewed and reconciled.  All changed / newly prescribed medications  were explained.  A complete medication list was provided to the patient / caregiver.

## 2010-10-04 NOTE — Letter (Signed)
Summary: [facility]  [facility]   Imported By: Maryln Gottron 01/11/2010 15:33:57  _____________________________________________________________________  External Attachment:    Type:   Image     Comment:   External Document

## 2010-11-20 LAB — COMPREHENSIVE METABOLIC PANEL
ALT: 11 U/L (ref 0–35)
AST: 43 U/L — ABNORMAL HIGH (ref 0–37)
Albumin: 4.6 g/dL (ref 3.5–5.2)
Alkaline Phosphatase: 65 U/L (ref 39–117)
BUN: 6 mg/dL (ref 6–23)
CO2: 24 mEq/L (ref 19–32)
Calcium: 9.2 mg/dL (ref 8.4–10.5)
Chloride: 100 mEq/L (ref 96–112)
Creatinine, Ser: 0.8 mg/dL (ref 0.4–1.2)
GFR calc Af Amer: >60 mL/min (ref >60)
GFR calc non Af Amer: >60 mL/min (ref >60)
Glucose, Bld: 102 mg/dL — ABNORMAL HIGH (ref 70–99)
Potassium: 4.1 mEq/L (ref 3.5–5.1)
Sodium: 143 mEq/L (ref 135–145)
Total Bilirubin: 0.8 mg/dL (ref 0.3–1.2)
Total Protein: 8.6 g/dL — ABNORMAL HIGH (ref 6.0–8.3)

## 2010-11-20 LAB — CBC
HCT: 39.5 % (ref 36.0–46.0)
Hemoglobin: 13 g/dL (ref 12.0–15.0)
MCHC: 32.9 g/dL (ref 30.0–36.0)
MCV: 81.6 fL (ref 78.0–100.0)
Platelets: 309 10*3/uL (ref 150–400)
RBC: 4.84 MIL/uL (ref 3.87–5.11)
RDW: 12.9 % (ref 11.5–15.5)
WBC: 6.9 10*3/uL (ref 4.0–10.5)

## 2010-11-20 LAB — URINALYSIS, ROUTINE W REFLEX MICROSCOPIC
Bilirubin Urine: NEGATIVE
Glucose, UA: NEGATIVE mg/dL
Ketones, ur: NEGATIVE mg/dL
Leukocytes, UA: NEGATIVE
Nitrite: NEGATIVE
Protein, ur: NEGATIVE mg/dL
Specific Gravity, Urine: 1.018 (ref 1.005–1.030)
Urobilinogen, UA: 0.2 mg/dL (ref 0.0–1.0)
pH: 7 (ref 5.0–8.0)

## 2010-11-20 LAB — DIFFERENTIAL
Basophils Absolute: 0 10*3/uL (ref 0.0–0.1)
Basophils Relative: 0 % (ref 0–1)
Eosinophils Absolute: 0.1 10*3/uL (ref 0.0–0.7)
Eosinophils Relative: 2 % (ref 0–5)
Lymphocytes Relative: 11 % — ABNORMAL LOW (ref 12–46)
Lymphs Abs: 0.8 10*3/uL (ref 0.7–4.0)
Monocytes Absolute: 0.6 10*3/uL (ref 0.1–1.0)
Monocytes Relative: 8 % (ref 3–12)
Neutro Abs: 5.4 10*3/uL (ref 1.7–7.7)
Neutrophils Relative %: 79 % — ABNORMAL HIGH (ref 43–77)

## 2010-11-20 LAB — CULTURE, BLOOD (ROUTINE X 2)
Culture: NO GROWTH
Culture: NO GROWTH

## 2010-11-20 LAB — URINE MICROSCOPIC-ADD ON

## 2010-11-20 LAB — PREGNANCY, URINE: Preg Test, Ur: NEGATIVE

## 2010-12-13 LAB — IRON AND TIBC
Iron: 41 ug/dL — ABNORMAL LOW (ref 42–135)
Saturation Ratios: 12 % — ABNORMAL LOW (ref 20–55)
TIBC: 333 ug/dL (ref 250–470)
UIBC: 292 ug/dL

## 2010-12-13 LAB — CBC
HCT: 28.2 % — ABNORMAL LOW (ref 36.0–46.0)
HCT: 30.7 % — ABNORMAL LOW (ref 36.0–46.0)
HCT: 31.1 % — ABNORMAL LOW (ref 36.0–46.0)
HCT: 31.4 % — ABNORMAL LOW (ref 36.0–46.0)
HCT: 41.4 % (ref 36.0–46.0)
Hemoglobin: 10.4 g/dL — ABNORMAL LOW (ref 12.0–15.0)
Hemoglobin: 10.5 g/dL — ABNORMAL LOW (ref 12.0–15.0)
Hemoglobin: 10.5 g/dL — ABNORMAL LOW (ref 12.0–15.0)
Hemoglobin: 9.9 g/dL — ABNORMAL LOW (ref 12.0–15.0)
Hemoglobin: 13.2 g/dL (ref 12.0–15.0)
MCHC: 33.5 g/dL (ref 30.0–36.0)
MCHC: 33.8 g/dL (ref 30.0–36.0)
MCHC: 33.8 g/dL (ref 30.0–36.0)
MCHC: 35 g/dL (ref 30.0–36.0)
MCHC: 31.8 g/dL (ref 30.0–36.0)
MCV: 80.1 fL (ref 78.0–100.0)
MCV: 81.1 fL (ref 78.0–100.0)
MCV: 81.1 fL (ref 78.0–100.0)
MCV: 81.4 fL (ref 78.0–100.0)
MCV: 81 fL (ref 78.0–100.0)
Platelets: 130 10*3/uL — ABNORMAL LOW (ref 150–400)
Platelets: 181 10*3/uL (ref 150–400)
Platelets: 80 10*3/uL — ABNORMAL LOW (ref 150–400)
Platelets: 91 10*3/uL — ABNORMAL LOW (ref 150–400)
Platelets: 112 10*3/uL — ABNORMAL LOW (ref 150–400)
RBC: 3.52 MIL/uL — ABNORMAL LOW (ref 3.87–5.11)
RBC: 3.77 MIL/uL — ABNORMAL LOW (ref 3.87–5.11)
RBC: 3.83 MIL/uL — ABNORMAL LOW (ref 3.87–5.11)
RBC: 3.87 MIL/uL (ref 3.87–5.11)
RBC: 5.11 MIL/uL (ref 3.87–5.11)
RDW: 13.6 % (ref 11.5–15.5)
RDW: 13.8 % (ref 11.5–15.5)
RDW: 14.2 % (ref 11.5–15.5)
RDW: 15 % (ref 11.5–15.5)
RDW: 13.3 % (ref 11.5–15.5)
WBC: 5 10*3/uL (ref 4.0–10.5)
WBC: 8.5 10*3/uL (ref 4.0–10.5)
WBC: 8.7 10*3/uL (ref 4.0–10.5)
WBC: 9.9 10*3/uL (ref 4.0–10.5)
WBC: 5 10*3/uL (ref 4.0–10.5)

## 2010-12-13 LAB — URINALYSIS, ROUTINE W REFLEX MICROSCOPIC
Glucose, UA: NEGATIVE mg/dL
Hgb urine dipstick: NEGATIVE
Ketones, ur: 15 mg/dL — AB
Nitrite: POSITIVE — AB
Protein, ur: 100 mg/dL — AB
Specific Gravity, Urine: 1.03 (ref 1.005–1.030)
Urobilinogen, UA: 1 mg/dL (ref 0.0–1.0)
pH: 6 (ref 5.0–8.0)

## 2010-12-13 LAB — URINE MICROSCOPIC-ADD ON

## 2010-12-13 LAB — FERRITIN: Ferritin: 29 ng/mL (ref 10–291)

## 2010-12-13 LAB — DIFFERENTIAL
Basophils Absolute: 0 10*3/uL (ref 0.0–0.1)
Basophils Absolute: 0.1 10*3/uL (ref 0.0–0.1)
Basophils Absolute: 0 10*3/uL (ref 0.0–0.1)
Basophils Relative: 0 % (ref 0–1)
Basophils Relative: 1 % (ref 0–1)
Basophils Relative: 1 % (ref 0–1)
Eosinophils Absolute: 0.2 10*3/uL (ref 0.0–0.7)
Eosinophils Absolute: 0.2 10*3/uL (ref 0.0–0.7)
Eosinophils Absolute: 0.1 10*3/uL (ref 0.0–0.7)
Eosinophils Relative: 2 % (ref 0–5)
Eosinophils Relative: 3 % (ref 0–5)
Eosinophils Relative: 1 % (ref 0–5)
Lymphocytes Relative: 54 % — ABNORMAL HIGH (ref 12–46)
Lymphocytes Relative: 57 % — ABNORMAL HIGH (ref 12–46)
Lymphocytes Relative: 47 % — ABNORMAL HIGH (ref 12–46)
Lymphs Abs: 2.5 10*3/uL (ref 0.7–4.0)
Lymphs Abs: 4.9 10*3/uL — ABNORMAL HIGH (ref 0.7–4.0)
Lymphs Abs: 2.3 10*3/uL (ref 0.7–4.0)
Monocytes Absolute: 0.5 10*3/uL (ref 0.1–1.0)
Monocytes Absolute: 0.9 10*3/uL (ref 0.1–1.0)
Monocytes Absolute: 0.7 10*3/uL (ref 0.1–1.0)
Monocytes Relative: 10 % (ref 3–12)
Monocytes Relative: 9 % (ref 3–12)
Monocytes Relative: 15 % — ABNORMAL HIGH (ref 3–12)
Neutro Abs: 1.7 10*3/uL (ref 1.7–7.7)
Neutro Abs: 2.7 10*3/uL (ref 1.7–7.7)
Neutro Abs: 1.9 10*3/uL (ref 1.7–7.7)
Neutrophils Relative %: 31 % — ABNORMAL LOW (ref 43–77)
Neutrophils Relative %: 33 % — ABNORMAL LOW (ref 43–77)
Neutrophils Relative %: 37 % — ABNORMAL LOW (ref 43–77)

## 2010-12-13 LAB — POCT CARDIAC MARKERS
CKMB, poc: <1 ng/mL — ABNORMAL LOW (ref 1.0–8.0)
Myoglobin, poc: 35.9 ng/mL (ref 12–200)
Troponin i, poc: <0.05 ng/mL (ref 0.00–0.09)

## 2010-12-13 LAB — COMPREHENSIVE METABOLIC PANEL
ALT: 59 U/L — ABNORMAL HIGH (ref 0–35)
ALT: 69 U/L — ABNORMAL HIGH (ref 0–35)
ALT: 93 U/L — ABNORMAL HIGH (ref 0–35)
ALT: 96 U/L — ABNORMAL HIGH (ref 0–35)
ALT: 130 U/L — ABNORMAL HIGH (ref 0–35)
AST: 106 U/L — ABNORMAL HIGH (ref 0–37)
AST: 107 U/L — ABNORMAL HIGH (ref 0–37)
AST: 57 U/L — ABNORMAL HIGH (ref 0–37)
AST: 69 U/L — ABNORMAL HIGH (ref 0–37)
AST: 161 U/L — ABNORMAL HIGH (ref 0–37)
Albumin: 2.1 g/dL — ABNORMAL LOW (ref 3.5–5.2)
Albumin: 2.2 g/dL — ABNORMAL LOW (ref 3.5–5.2)
Albumin: 2.3 g/dL — ABNORMAL LOW (ref 3.5–5.2)
Albumin: 2.4 g/dL — ABNORMAL LOW (ref 3.5–5.2)
Albumin: 3.8 g/dL (ref 3.5–5.2)
Alkaline Phosphatase: 250 U/L — ABNORMAL HIGH (ref 39–117)
Alkaline Phosphatase: 265 U/L — ABNORMAL HIGH (ref 39–117)
Alkaline Phosphatase: 268 U/L — ABNORMAL HIGH (ref 39–117)
Alkaline Phosphatase: 274 U/L — ABNORMAL HIGH (ref 39–117)
Alkaline Phosphatase: 404 U/L — ABNORMAL HIGH (ref 39–117)
BUN: 1 mg/dL — ABNORMAL LOW (ref 6–23)
BUN: 2 mg/dL — ABNORMAL LOW (ref 6–23)
BUN: 3 mg/dL — ABNORMAL LOW (ref 6–23)
BUN: 6 mg/dL (ref 6–23)
BUN: 7 mg/dL (ref 6–23)
CO2: 25 mEq/L (ref 19–32)
CO2: 26 mEq/L (ref 19–32)
CO2: 26 mEq/L (ref 19–32)
CO2: 28 mEq/L (ref 19–32)
CO2: 29 mEq/L (ref 19–32)
Calcium: 7.9 mg/dL — ABNORMAL LOW (ref 8.4–10.5)
Calcium: 8 mg/dL — ABNORMAL LOW (ref 8.4–10.5)
Calcium: 8 mg/dL — ABNORMAL LOW (ref 8.4–10.5)
Calcium: 8.4 mg/dL (ref 8.4–10.5)
Calcium: 9 mg/dL (ref 8.4–10.5)
Chloride: 103 mEq/L (ref 96–112)
Chloride: 104 mEq/L (ref 96–112)
Chloride: 107 mEq/L (ref 96–112)
Chloride: 108 mEq/L (ref 96–112)
Chloride: 99 mEq/L (ref 96–112)
Creatinine, Ser: 0.66 mg/dL (ref 0.4–1.2)
Creatinine, Ser: 0.66 mg/dL (ref 0.4–1.2)
Creatinine, Ser: 0.68 mg/dL (ref 0.4–1.2)
Creatinine, Ser: 0.76 mg/dL (ref 0.4–1.2)
Creatinine, Ser: 0.7 mg/dL (ref 0.4–1.2)
GFR calc Af Amer: >60 mL/min (ref >60)
GFR calc Af Amer: >60 mL/min (ref >60)
GFR calc Af Amer: >60 mL/min (ref >60)
GFR calc Af Amer: >60 mL/min (ref >60)
GFR calc Af Amer: >60 mL/min (ref >60)
GFR calc non Af Amer: >60 mL/min (ref >60)
GFR calc non Af Amer: >60 mL/min (ref >60)
GFR calc non Af Amer: >60 mL/min (ref >60)
GFR calc non Af Amer: >60 mL/min (ref >60)
GFR calc non Af Amer: >60 mL/min (ref >60)
Glucose, Bld: 103 mg/dL — ABNORMAL HIGH (ref 70–99)
Glucose, Bld: 135 mg/dL — ABNORMAL HIGH (ref 70–99)
Glucose, Bld: 86 mg/dL (ref 70–99)
Glucose, Bld: 93 mg/dL (ref 70–99)
Glucose, Bld: 130 mg/dL — ABNORMAL HIGH (ref 70–99)
Potassium: 3.3 mEq/L — ABNORMAL LOW (ref 3.5–5.1)
Potassium: 3.4 mEq/L — ABNORMAL LOW (ref 3.5–5.1)
Potassium: 3.5 mEq/L (ref 3.5–5.1)
Potassium: 3.9 mEq/L (ref 3.5–5.1)
Potassium: 3.8 mEq/L (ref 3.5–5.1)
Sodium: 136 mEq/L (ref 135–145)
Sodium: 137 mEq/L (ref 135–145)
Sodium: 139 mEq/L (ref 135–145)
Sodium: 142 mEq/L (ref 135–145)
Sodium: 139 mEq/L (ref 135–145)
Total Bilirubin: 1.2 mg/dL (ref 0.3–1.2)
Total Bilirubin: 1.6 mg/dL — ABNORMAL HIGH (ref 0.3–1.2)
Total Bilirubin: 2.6 mg/dL — ABNORMAL HIGH (ref 0.3–1.2)
Total Bilirubin: 2.7 mg/dL — ABNORMAL HIGH (ref 0.3–1.2)
Total Bilirubin: 3.4 mg/dL — ABNORMAL HIGH (ref 0.3–1.2)
Total Protein: 5.3 g/dL — ABNORMAL LOW (ref 6.0–8.3)
Total Protein: 5.4 g/dL — ABNORMAL LOW (ref 6.0–8.3)
Total Protein: 5.6 g/dL — ABNORMAL LOW (ref 6.0–8.3)
Total Protein: 5.8 g/dL — ABNORMAL LOW (ref 6.0–8.3)
Total Protein: 8.1 g/dL (ref 6.0–8.3)

## 2010-12-13 LAB — CULTURE, BLOOD (ROUTINE X 2)
Culture: NO GROWTH
Culture: NO GROWTH
Culture: NO GROWTH
Culture: NO GROWTH

## 2010-12-13 LAB — FOLATE: Folate: 12.6 ng/mL

## 2010-12-13 LAB — LEGIONELLA ANTIGEN, URINE: Legionella Antigen, Urine: NEGATIVE

## 2010-12-13 LAB — RETICULOCYTES
RBC.: 3.89 MIL/uL (ref 3.87–5.11)
Retic Count, Absolute: 62.2 10*3/uL (ref 19.0–186.0)
Retic Ct Pct: 1.6 % (ref 0.4–3.1)

## 2010-12-13 LAB — URINE CULTURE
Colony Count: 75000
Special Requests: NEGATIVE

## 2010-12-13 LAB — HEPATITIS PANEL, ACUTE
HCV Ab: NEGATIVE
Hep A IgM: NEGATIVE
Hep B C IgM: NEGATIVE
Hepatitis B Surface Ag: NEGATIVE

## 2010-12-13 LAB — CMV ABS, IGG+IGM (CYTOMEGALOVIRUS)
CMV IgM: <8 AU/mL (ref <30.0)
Cytomegalovirus Ab-IgG: <0.2 U/mL (ref <0.4)

## 2010-12-13 LAB — ROCKY MTN SPOTTED FVR AB, IGM-BLOOD: RMSF IgM: 0.14 IV (ref 0.00–0.89)

## 2010-12-13 LAB — STREP PNEUMONIAE URINARY ANTIGEN: Strep Pneumo Urinary Antigen: NEGATIVE

## 2010-12-13 LAB — MONONUCLEOSIS SCREEN: Mono Screen: POSITIVE — AB

## 2010-12-13 LAB — ROCKY MTN SPOTTED FVR AB, IGG-BLOOD: RMSF IgG: 0.14 IV

## 2010-12-13 LAB — PATHOLOGIST SMEAR REVIEW

## 2010-12-13 LAB — VITAMIN B12: Vitamin B-12: 422 pg/mL (ref 211–911)

## 2011-01-17 NOTE — Discharge Summary (Signed)
[name], [name] NO.:  [id number]   MEDICAL RECORD NO.:  [id number]          PATIENT TYPE:  INP   LOCATION:  1521                         FACILITY:  [facility]   PHYSICIAN:  Hillery Aldo, M.D.   DATE OF BIRTH:  01-21-1971   DATE OF ADMISSION:  01/04/2009  DATE OF DISCHARGE:  01/08/2009                               DISCHARGE SUMMARY   ADDENDUM:   PRIMARY CARE PHYSICIAN:  Dr. Merri [place].   DISCHARGE DIAGNOSES:  1. Acute infectious mononucleosis.  2. Atypical pneumonia.  3. Hepatitis secondary to acute mononucleosis.  4. Thrombocytopenia, resolved.  5. Irritable bowel syndrome.  6. Gastroesophageal reflux disease.  7. Generalized anxiety disorder.  8. Hypokalemia.  9. Normocytic anemia, iron deficiency.  10.Severe headache.  11.Candidal vulvovaginitis.  12.Klebsiella pneumonia urinary tract infection.  13.Sepsis, likely due to urinary pathogen, blood cultures negative.  14.Hypertension.   DISCHARGE MEDICATIONS:  1. Cymbalta 60 mg p.o. daily.  2. Xanax XR 0.5 mg p.o. daily.  3. Phenergan 25 mg p.o. b.i.d. p.r.n. nausea.  4. Hyoscyamine 0.125 mg p.o. daily.  5. Zyrtec 10 mg p.o. daily.  6. Zofran 4 mg p.o. q.6 hours p.r.n. nausea/vomiting.  7. Azithromycin 250 mg p.o. daily x4 more days.  8. Levaquin 750 mg p.o. daily x4 more days.  9. Vicodin 5/500 one tablet q.6 hours p.r.n. pain.  10.Diflucan 100 mg daily and q. week p.r.n. vulvovaginitis.   CONSULTATIONS:  None.   For brief admission HPI, procedures and diagnostic studies, and hospital  course through Jan 05, 2009, please see my previously dictated discharge  summary.   ADDITIONAL PROCEDURES AND DIAGNOSTIC STUDIES:  Chest x-ray on Jan 07, 2009 showed no significant change.  Bibasilar atelectasis versus  infiltrates.  Bilateral effusions.   DISCHARGE LABORATORY VALUES:  [place] Spotted Fever titers were  negative.  Anemia panel showed an iron level of 41, TIBC 333, 12%  saturation,  vitamin B12 - 422, serum folate 12.6, ferritin 29.  Sodium  was 142, potassium 3.9, chloride 108, bicarb 28, BUN 1, creatinine 0.66,  glucose 86, total bilirubin 1.2, alkaline phosphatase 265, AST 57, ALT  59, total protein 5.8, albumin 2.2, calcium 8.4.  White blood cell count  was 9.9, hemoglobin at 10.4, hematocrit 30.7, platelets 181.   HOSPITAL COURSE BY PROBLEM:  1. Infectious mononucleosis:  The patient did have a presenting      illness consistent with infectious mononucleosis which was      confirmed with a monospot.  At this time, she has been afebrile      since Jan 06, 2009 but continues to have episodes of diaphoresis.      The patient has been advised regarding the importance of avoiding      heavy lifting and the importance of rest in her recovery.  2. Atypical pneumonia:  The patient did have some hypoxia and chest      radiograph findings consistent with an atypical pneumonia.  She was      initially started on azithromycin.  Levaquin was added due to      ongoing temperature spikes.  She will complete a total course of      therapy of 7 days.  3. Acute hepatitis:  The patient's liver function studies have begun      to trend down.  Her bilirubin reached a high value of 2.7.  Her      other liver function studies are also trending down.  4. Sepsis/Klebsiella pneumonia urinary tract infection:  The patient's      ongoing symptoms were likely due to sepsis from a urinary source.      Cultures did confirm Klebsiella pneumonia.  This organism was found      to be quinolone sensitive.  5. Thrombocytopenia:  This likely was reflective of acute splenomegaly      from mononucleosis versus sepsis.  Her discharge platelet count is      normal.  6. History of irritable bowel syndrome:  The patient has been stable.  7. Gastroesophageal reflux disease:  The patient was maintained on      proton pump inhibitor therapy.  8. Anxiety:  The patient was maintained on her usual dose of  Xanax.      She did require Valium at bedtime to help with sleep.  9. Hypokalemia:  The patient was appropriately repleted.  10.Normocytic anemia:  The patient did have an anemia panel done which      does show low iron and is consistent with a menstruating female.  11.Headache:  The patient did develop a severe headache and there was      some concern for mononucleosis, aseptic meningitis versus the      possibility of [facility] Spotted Fever given her known history      of Lyme disease in the past.  The patient could not recall any      specific tick exposures and did not have any rash.  Nevertheless,      [facility] Spotted Fever titers were sent and were negative.      The patient's headache resolved with supportive measures.  12.Hypertension:  The patient has developed some elevated systolic      blood pressures in the mid 90s.  She has no known history of      hypertensive disease.  The patient is instructed to follow up with      Dr. Renne Crigler for a recheck of her blood pressure to ensure that her      blood pressure has normalized.  If not, the patient may need to be      started on antihypertensive medications.   DISPOSITION:  The patient is medically stable and will be discharged.  She is encouraged to follow up with Dr. Renne Crigler early next week and at  that time, she should have a repeat of her liver function studies and  electrolytes as well as her blood counts to ensure that they remain  stable.  She should have a check of her blood pressure to ensure that it  is stable as well.  Her discharge blood pressure is 136/96.  If she  remains elevated, Dr. Renne Crigler should consider treatment with an  antihypertensive at that time.   Time spent coordinating care for discharge and discharge instructions  equals approximately 35 minutes.      Hillery Aldo, M.D.  Electronically Signed     CR/MEDQ  D:  01/08/2009  T:  01/09/2009  Job:  [id number]   cc:   Soyla Murphy. Renne Crigler, M.D.   Fax: [phone]

## 2011-01-17 NOTE — Op Note (Signed)
NAME:  [name], [name]               ACCOUNT NO.:  [id number]   MEDICAL RECORD NO.:  [id number]          PATIENT TYPE:  OIB   LOCATION:  1538                         [facility]ACILITY:  [facility]   PHYSICIAN:  Daniel L. Gottsegen, M.D.DATE OF BIRTH:  01-10-1971   DATE OF PROCEDURE:  03/11/2007  DATE OF DISCHARGE:                               OPERATIVE REPORT   PREOPERATIVE DIAGNOSIS:  Left paratubal cyst.   POSTOPERATIVE DIAGNOSIS:  Serous cyst adenofibroma of left ovary and  corpus luteal cyst of left ovary.   OPERATION:  Laparoscopy with multiple ovarian cystectomies.   SURGEON:  Daniel L. Eda Paschal, M.D.   FIRST ASSISTANT:  Dr. Daphine Deutscher.   FINDINGS:  At the time of laparoscopy, the patient had a normal mobile  uterus.  There was nothing abnormal in the cul-de-sac.  Right fallopian  tube and ovary were completely normal.  Left adnexa was enlarged by what  initially appeared to be a paraovarian cyst, near the fimbriated end of  the left fallopian tube.  The patient had normal fimbria.  The fimbria  themselves were slightly adherent to the mesentery of the sigmoid colon.  Once this area had been opened there were excrescences present on the  inside of this cyst as well as a serous fluid that drained from the  cyst.   PROCEDURE:  After adequate general endotracheal anesthesia the patient  was placed in the dorsal lithotomy position, prepped and draped in the  usual sterile manner.  A Foley catheter was inserted into the patient's  bladder.  A Hulka catheter was inserted into the uterus Dr. Daphine Deutscher  placed the trocars so that they would be appropriately placed for him  when he did his laparoscopic cholecystectomy.  At this point I inspected  the pelvis; and all the abnormal findings were limited to the left  adnexa.  The left adnexal cyst which initially was thought to be a  cystic paratubal cyst was grasped.  Using sharp scissors it was entered.  Serous fluid drained, and then as the cyst was  opened more to see how  much of it could be removed without injuring the tube, excrescences were  encountered on the inner part of the wall.  As a result of this, a  portion of the cyst wall was excised and sent for frozen section.  While  we are waiting for the frozen section the entire cyst could be dissected  free from both the tube and the ovary.  Bipolar coagulation was used to  stop all bleeding.  Copious irrigation was done with sterile saline; and  at this point the frozen section came back a benign serous cyst  adenofibroma of the left ovary.   There was now an adjacent small 1 to 1.5 cm cyst to it.  It was felt  that this also should be excised while we were there.  It was excised by  sharp dissection.  It was placed in an Endopouch because it appeared to  be more solid than the other lesion; and we did not want to tear it in  removing it; and then it  was removed through the Endopouch through one  of the lower incisions.  It was sent for pathology and frozen section  came back benign corpus luteal cyst.  The area was carefully inspected  all bleeding was controlled with bipolar.  All cul-de-sac fluid was  removed.  The ovary itself appeared to come together nicely; and it was  felt that the patient would have a nice normal left ovary.  An attempt  was not made to free up the fimbria of the tube from the mesentery for  fear of causing either more adhesions or bleeding.  It was felt that it  was so situated that normal oviductal transfer could take place.   At this point in the procedure Dr. Eda Paschal scrubbed out and Dr. Daphine Deutscher  did his laparoscopic cholecystectomy which is dictated in a separate  operative note.      Daniel L. Eda Paschal, M.D.  Electronically Signed     DLG/MEDQ  D:  03/11/2007  T:  03/11/2007  Job:  [id number]

## 2011-01-17 NOTE — Discharge Summary (Signed)
[name], [name] NO.:  [id number]   MEDICAL RECORD NO.:  [id number]          PATIENT TYPE:  INP   LOCATION:  1521                         FACILITY:  [facility]   PHYSICIAN:  Hillery Aldo, M.D.   DATE OF BIRTH:  [birth year]   DATE OF ADMISSION:  01/04/2009  DATE OF DISCHARGE:  01/05/2009                               DISCHARGE SUMMARY   PRIMARY CARE PHYSICIAN:  Soyla Murphy. Renne Crigler, M.D.   DISCHARGE DIAGNOSES:  1. Acute mononucleosis.  2. Hepatitis secondary to acute mononucleosis.  3. Thrombocytopenia likely due to splenomegaly.  4. Irritable bowel syndrome.  5. Gastroesophageal reflux disease.  6. History of generalized anxiety disorder.  7. Hypokalemia.  8. Normocytic anemia.   DISCHARGE MEDICATIONS:  1. Cymbalta 60 mg p.o. daily.  2. Xanax XR 0.5 mg p.o. daily.  3. Phenergan 25 mg p.o. b.i.d. p.r.n. nausea.  4. Hyoscyamine 0.125 mg p.o. daily.  5. Zyrtec 10 mg p.o. daily.  6. Zofran 4 mg p.o. every 6 hours p.r.n. nausea/vomiting.  7. Azithromycin 250 mg daily x4 days.  8. Vicodin 5/500 one tablet every 6 hours p.r.n. pain.   CONSULTATIONS:  None.   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  She had been seen several times at an urgent care  center and was treated with an outpatient course of amoxicillin.  She  reported fevers up to 102.5 and subsequently presented to the ER for  evaluation.  Upon initial presentation, she was noted to have evidence  of acute hepatitis and atypical pneumonia on chest radiography and,  therefore, was admitted for further evaluation and workup.  For full  details, please see the dictated report done by Dr. Lovell Sheehan.   PROCEDURES AND DIAGNOSTIC STUDIES.:  Chest x-ray on Jan 03, 2009, showed  low lung volumes with small bilateral  pleural effusions and bibasilar  patchy opacities.  Favor acute bilateral pulmonary infection including  viral and atypical etiologies.   DISCHARGE LABORATORY VALUES:  Sodium was 139, potassium 3.4 (repleted),  chloride 107, bicarbonate 25, BUN 2, creatinine 0.66, glucose 103,  calcium 8.0, total bilirubin 2.6, alkaline phosphatase 274, AST 107, ALT  96, total protein 5.4, albumin 2.3.  White blood cell count was 8.5,  hemoglobin 10.5, hematocrit 31.1, and platelets 91,000.   HOSPITAL COURSE BY PROBLEM:  1. Acute mononucleosis:  Given the patient's history of present      illness and exposure to school children, there was a high index of      suspicion for infectious mononucleosis.  A Monospot screen was      subsequently checked and returned positive.  At this point, care is      supportive with an emphasis on rest, adequate fluid and nutritional      intake, and no heavy lifting given her likely liver and spleen      involvement.  2. Atypical pneumonia:  Given the patient's fever and findings on  chest radiography, the patient was empirically started on      azithromycin.  We will continue azithromycin for an additional 4      days of treatment.  3. Acute hepatitis:  The patient's abnormal liver function studies      were likely due to mononucleosis with liver and she likely also has      spleen involvement.  At this point, the patient has been advised to      do no heavy lifting for the next several weeks and to return to her      primary care physician on a weekly basis for routine laboratory      testing to ensure that her liver function studies are improving      over time.  4. Thrombocytopenia:  This is most likely due to splenic sequestration      and splenomegaly related to acute mononucleosis.  The patient has      not had any signs of active bleeding.  5. History of irritable bowel syndrome:  The patient will continue on      her usual home medications including  hyoscyamine.  6. Gastroesophageal reflux disease:  The patient was maintained on      proton pump inhibitor therapy.  7. Anxiety:  The patient was maintained on her usual dose of Xanax.  8. Hypokalemia:  The patient was appropriately repleted prior to      discharge.  9. Normocytic anemia:  The patient is a 40 year old menstruating      female and no further diagnostic workup is indicated at this time.   DISPOSITION:  The patient is medically stable for discharge home.  She  is instructed to follow up with Dr. Renne Crigler within one for repeat of the  lab tests, to maintain a low level of activity and rest, and to stay out  of work for the next several weeks until cleared to return by Dr. Renne Crigler.  She is to do no heavy lifting over 5 pounds until cleared to do so by  Dr. Renne Crigler.   Time spent coordinating care for discharge and discharge instructions  equals 35 minutes.      Hillery Aldo, M.D.  Electronically Signed     CR/MEDQ  D:  01/05/2009  T:  01/05/2009  Job:  [id number]   cc:   Soyla Murphy. Renne Crigler, M.D.  Fax: [phone]

## 2011-01-17 NOTE — H&P (Signed)
NAME:  [name], ROMITO NO.:  [id number]   MEDICAL RECORD NO.:  [id number]          PATIENT TYPE:  INP   LOCATION:  1521                         FACILITY:  [facility]   PHYSICIAN:  Della Goo, M.D. DATE OF BIRTH:  08-01-1971   DATE OF ADMISSION:  01/03/2009  DATE OF DISCHARGE:                              HISTORY & PHYSICAL   PRIMARY CARE PHYSICIAN:  Soyla Murphy. Renne Crigler, MD.   CHIEF COMPLAINT:  Fever, chills.   HISTORY OF PRESENT ILLNESS:  This is a 40 year old female, who presented  to the emergency department at the [facility] for evaluation  secondary to continued illness for the past 2 weeks.  She states that  she has had fever, chills, myalgias, nausea.  She states that she had  been seen multiple times at an urgent care center and had been placed on  amoxicillin initially and given a diagnosis of a viral illness.  She  states that her symptoms continued to worsen, and she has had fevers up  to 102.5.  She states that she returned to the Urgent Care Center in  [place] and was given a prescription for Relenza therapy 2 days ago.  She states that she has continued to have symptoms and went to a  different urgent care center where she was advised to go to the  emergency department.   The patient does report 2 weeks ago being bitten by an insect behind her  right ear.  She does not know what type of insect and reports that this  area had swollen up.  She states that the area did come down.  She  denies any discomfort at the area currently.  She does state that she  did have a history of Lyme disease diagnosed 2 years ago.   PAST MEDICAL HISTORY:  Significant for:  1. Irritable bowel syndrome.  2. Gastroesophageal reflux disease.  3. Past history of Lyme disease.  4. Vitamin D deficiency.  5. Anxiety disorder.   PAST SURGICAL HISTORY:  1. History of a tonsillectomy.  2. History of a cholecystectomy.   MEDICATIONS:  At this time include  hyoscyamine, Cymbalta, Xanax Extended  Release, and Phenergan p.r.n.  Medications she had been prescribed at  the urgent care centers had been amoxicillin then Relenza and Tylenol  with Codeine.   Patient states that she has allergies to:  1. PENICILLIN, which causes swelling.  2. SULFA, which causes itching.  3. DOXYCYCLINE, which causes nausea and vomiting.  4. TAMIFLU.  5. BENADRYL THERAPY.   SOCIAL HISTORY:  The patient is a nonsmoker, nondrinker.  She denies any  illicit drug usage.   FAMILY HISTORY:  Positive for coronary artery disease in her maternal  grandfather, positive for hypertension in her mother, positive for  diabetes in her mother and her maternal grandmother, and positive for  cancer.  Father had pancreatic cancer.   REVIEW OF SYSTEMS:  Pertinents are mentioned above.  All other systems  are negative.   PHYSICAL EXAMINATION FINDINGS:  GENERAL:  This is a morbidly obese, 80-  year-old female in discomfort, but  no acute distress.  VITAL SIGNS:  Her temperature is 97.6, blood pressure 111/83, heart rate  132, respirations 20, O2 saturation is 100% on room air.  HEENT:  Normocephalic, atraumatic.  There is no scleral icterus, pupils  are equally round and reactive to light,  extraocular movements are  intact, funduscopic benign.  Nares are patent bilaterally.  Oropharynx  is clear.  NECK:  Supple, full range of motion, no thyromegaly, adenopathy, or  jugulovenous distention.  CARDIOVASCULAR:  Tachycardiac rate, rhythm, no  murmurs, gallops, rubs.  LUNGS:  Clear to auscultation bilaterally.  ABDOMEN:  Positive bowel sounds, soft, nontender, and nondistended.  EXTREMITIES:  Without cyanosis, clubbing, or edema.  NEUROLOGIC:  Examination nonfocal.   LABORATORY STUDIES:  White blood cell count 5.0, hemoglobin 13.2,  hematocrit 41.4, platelets 112, neutrophils 37%, lymphocytes 47%.  Sodium 139, potassium 3.8, chloride 99, bicarb 29, BUN 7, creatinine  0.7, and  glucose 130.  Point-of-care cardiac markers with a myoglobin of  35.9, CK-MB less than 1.0, troponin less than 0.05.  Chest x-ray reveals  decreased lung volumes and bilateral pleural effusions consistent with  an atypical pneumonia.   ASSESSMENT:  A 40 year old female being admitted with:  1. Atypical pneumonia.  2. Thrombocytopenia.  3. Nausea.   PLAN:  Patient will be admitted and placed on empiric antibiotic  therapy.  Cultures have been done at the emergency department.  Vancomycin therapy had been started; however, patient suffered redness  and itching.  It is not clear whether this is an allergic reaction  versus redman syndrome from the rapid administration of vancomycin  therapy, which is not an allergic reaction..  No further vancomycin  therapy will be given at this time .  Patient also has an ALLERGY TO  BENADRYL THERAPY and IV Pepcid therapy has been ordered for the possible  allergic reaction.  Azithromycin therapy has been ordered now IV for  coverage of an atypical pneumonia and patient will continue on Relenza  therapy as well in the event that this is possibly H1N1 flu.  The  patient has been placed on droplet precautions.  Her regular medications  will be continued, and IV fluids have been ordered for fluid  resuscitation.  Antiemetics have been ordered, and patient's liver  function tests will be monitored.      Della Goo, M.D.  Electronically Signed     HJ/MEDQ  D:  01/04/2009  T:  01/04/2009  Job:  [id number]

## 2011-01-17 NOTE — Assessment & Plan Note (Signed)
[facility]                         GASTROENTEROLOGY OFFICE NOTE   NAME:JOHNSONSheelah, Ritacco                      MRN:          [id number]  DATE:04/29/2007                            DOB:          07/07/1971    REASON FOR EVALUATION:  1. Diarrhea.  2. Reflux disease.  3. Requests medication refill.   HISTORY:  This is a 40 year old female with a history of reflux disease  and irritable bowel syndrome.  She was initially evaluated in this  office August 30, 2005 for abdominal pain, nausea, vomiting, and  diarrhea.  She was last seen in the office October 16, 2005.  At that  time her symptoms were improved on Nexium and Reglan p.r.n.  Upper  endoscopy in January of 2007 was normal.  She also has a history of  adenomatous colon polyps.  Her last colonoscopy in July of 2005 was  normal.  Followup in 5 years recommended.  Since being seen in February  of 2007, the patient tells me that she was diagnosed with Lyme disease.  She was treated with several rounds of antibiotics.  At one point she  was sent for a urologic evaluation and was found to have ovarian cysts.  She was also having some problems with chest pain and felt this may  represent gallbladder disease.  She requested to be seen by Dr. Daphine Deutscher  who performed laparoscopic cholecystectomy concurrent with multiple  ovarian cystectomies by Dr. Eda Paschal on March 11, 2007.  Post-  cholecystectomy, the patient has had severe diarrhea up to 10 times per  day.  She saw Dr. Daphine Deutscher and was placed on Questran 4 g daily.  She does  not like the taste, though the diarrhea has improved to 2-3 times per  day which is near her baseline.  She is off Nexium for unclear reasons.  She has nausea intermittently.  She requests a refill of Nexium and  Reglan.   CURRENT MEDICATIONS:  1. Acidophilus.  2. Lexapro 10 mg daily.  3. Cholestyramine 4 g daily.  4. Alprazolam 0.5 mg p.r.n.   PHYSICAL EXAMINATION:  GENERAL:   Well-appearing female in no acute  distress.  VITAL SIGNS:  Blood pressure 122/72, heart rate 76, weight 179.2 pounds  (no change).  HEENT:  Sclerae anicteric.  Conjunctivae pink.  Oral mucosa is intact.  No adenopathy.  LUNGS:  Clear.  HEART:  Regular.  ABDOMEN:  Soft, nontender, nondistended, with good bowel sounds.  Or  organomegaly, mass, or hernia.  Recent surgical incisions are well-  healed.   IMPRESSION:  1. Bile salt-induced diarrhea post-cholecystectomy.  Improved on      Questran.  2. Gastroesophageal reflux disease.  Some symptoms off Nexium.  3. Irritable bowel.  Stable.   RECOMMENDATIONS:  1. Resume Nexium 40 mg daily.  2. Reglan 10 mg q.6h. p.r.n.  3. Change from Questran to Colestid tablets 1 g b.i.d.  Adjust as      needed.  4. GI followup p.r.n.     Wilhemina Bonito. Marina Goodell, MD  Electronically Signed    JNP/MedQ  DD: 04/29/2007  DT: 04/30/2007  Job #: [id number]   cc:   Marne A. Milinda Antis, MD  Rande Brunt. Eda Paschal, M.D.  Th[place] Park Daphine Deutscher, MD

## 2011-01-17 NOTE — Op Note (Signed)
NAMECASSIOPEIA, FLORENTINO               ACCOUNT NO.:  [id number]   MEDICAL RECORD NO.:  [id number]          PATIENT TYPE:  OIB   LOCATION:  1538                         FACILITY:  [facility]   PHYSICIAN:  Thornton Park. Daphine Deutscher, MD  DATE OF BIRTH:  20-Sep-1970   DATE OF PROCEDURE:  03/11/2007  DATE OF DISCHARGE:                               OPERATIVE REPORT   PREOPERATIVE DIAGNOSIS:  Right upper quadrant abdominal pain, biliary  dyskinesia.   POSTOPERATIVE DIAGNOSIS:  Right upper quadrant abdominal pain, biliary  dyskinesia.   PROCEDURE:  Laparoscopic cholecystectomy with intraoperative  cholangiogram.   SURGEON:  Dr. Daphine Deutscher   ASSISTANT:  Leonie Man, M.D.   ANESTHESIA:  General.   SPECIMEN:  Gallbladder.   DESCRIPTION OF PROCEDURE:  The patient is a 36-year lady known we had  previously placed an umbilical trocar using a Hassan technique, two 5  laterally and Daniel L. Gottsegen, M.D. had done a left oophorectomy.  We added two more trocars in the upper abdomen and then we grasped  gallbladder elevated.  Calot's triangle was dissected free and critical  view was achieved.  A clip was placed on the gallbladder and the cystic  duct was incised and a Reddick catheter inserted.  There was small  cystic duct on the cholangiogram which went down into the small common  duct with retrograde filling intrahepatically and then free flow in the  duodenum.  No stones were seen.  The cystic duct was triple clipped,  divided. The cystic artery was triple clipped, divided. The gallbladder  was removed the gallbladder bed with hook electrocautery and placed in a  bag and brought out through the umbilicus.  We then  irrigated gallbladder bed.  No bleeding or bile leaks noted.  The wounds  were injected with 0.5% Marcaine and the umbilical site was repaired  under laparoscopic vision with a single 0 Vicryl.  The wounds were  closed 4-0 Vicryl, Benzoin, and Steri-Strips.  The patient was taken to  recovery room in satisfactory condition.      Th[place] Park Daphine Deutscher, MD  Electronically Signed     MBM/MEDQ  D:  03/11/2007  T:  03/12/2007  Job:  [id number]   cc:   Rande Brunt. Eda Paschal, M.D.  Fax: [phone]   Marne A. Tower, MD  [address] [place], [place] [address]

## 2011-01-17 NOTE — Op Note (Signed)
NAME:  [name], [name]               ACCOUNT NO.:  [id number]   MEDICAL RECORD NO.:  [id number]          PATIENT TYPE:  AMB   LOCATION:  DSC                          FACILITY:  [facility]   PHYSICIAN:  Zola Button T. Lazarus Salines, M.D. DATE OF BIRTH:  [birth year]   DATE OF PROCEDURE:  08/31/2008  DATE OF DISCHARGE:                               OPERATIVE REPORT   PREOPERATIVE DIAGNOSIS:  Chronic cryptic tonsillitis with tonsillolith  debris.   POSTOPERATIVE DIAGNOSIS:  Chronic cryptic tonsillitis with tonsillolith  debris.   PROCEDURE PERFORMED:  Tonsillectomy.   SURGEON:  Gloris Manchester. Lazarus Salines, MD   ANESTHESIA:  General orotracheal.   BLOOD LOSS:  None.   COMPLICATIONS:  None.   FINDINGS:  1+ embedded slightly fibrotic cryptic tonsils.  Normal soft  palate.  No significant residual adenoids.  Clear anterior nose.   PROCEDURE:  With the patient in a comfortable supine position, general  orotracheal anesthesia was induced without difficulty.  At an  appropriate level, the table was turned 90 degrees and the patient  placed in Trendelenburg.  A clean preparation and draping was  accomplished.  Taking care to protect lips, teeth, and endotracheal  tube, the Crowe-Davis mouth gag was introduced, expanded for  visualization, and suspended from the Mayo stand in the standard  fashion.  The findings were as described above.  Palate retractor and  mirror were used to visualize the nasopharynx with the findings as  described above.  The anterior nose was examined with a nasal speculum  with the findings as described above.  Xylocaine 0.5% with 1:200,000  epinephrine, 8 mL total was infiltrated into the peritonsillar planes  for intraoperative hemostasis.  Several minutes were allowed for this to  take effect.   Beginning on the left side, the tonsil was grasped and retracted  medially.  The mucosa overlying the anterior and superior poles was  coagulated and then cut down to the capsule of the tonsil.   Using the  cautery tip as a blunt dissector, lysing fibrous bands, and coagulating  crossing vessels as identified, the tonsil was dissected from its  muscular fossa from anterior to posterior and from inferior to superior.  The tonsil was removed in its entirety as determined by examination of  both tonsil and fossa.  A small additional quantity of cautery rendered  the fossa hemostatic.  After completing the left side, the right side  was done in identical fashion.  The right tonsil was slightly more  cryptic and slightly more fibrotic.   After completing both tonsillectomies, the oropharynx was observed to be  hemostatic.  At this point, the mouth gag was relaxed for several  minutes.  Upon re-expansion, hemostasis was persistent.  At this point,  the procedure was completed.  The mouth gag was relaxed and removed.  The dental status was intact.  The patient was returned to Anesthesia,  awakened, extubated, and transferred to recovery in stable condition.   COMMENT:  A 40 year old white female with a long history of cryptic  tonsillolith debris, halitosis and recurrent infections was the  indication for today's procedure.  Anticipated routine postoperative  recovery with attention to elevation, analgesia, antibiosis, hydration,  and observation for bleeding, emesis, or airway compromise.  Given  geographic distance, we will observe her 23 hours observation.      Gloris Manchester. Lazarus Salines, M.D.  Electronically Signed     KTW/MEDQ  D:  08/31/2008  T:  08/31/2008  Job:  [id number]   cc:   Soyla Murphy. Renne Crigler, M.D.

## 2011-01-20 NOTE — Op Note (Signed)
[facility]  Patient:    [name], [name]                      MRN: [id number] Proc. Date: 03/20/00 Adm. Date:  [id number] Attending:  Katha Cabal CC:         Marlise Eves, P.A. at [facility]                           Operative Report  CCS NUMBER:  [phone]  PREOPERATIVE DIAGNOSIS:  Discolored mass in the left groin, history of dysplastic nevi removed from periumbilical region.  POSTOPERATIVE DIAGNOSIS:  Probable "pilonidal" cyst, left groin.  PROCEDURE:  Excision of mass, left groin.  SURGEON:  Thorn[place] Park. Daphine Deutscher, M.D.  ANESTHESIA:  MAC.  DESCRIPTION OF PROCEDURE:  The patient was taken to OR #3 on March 20, 2000, and given some intravenous sedation, the area we had previously marked and infiltrated with 1% lidocaine.  I excised skin over the underlying mass and created small flaps above and below, and then went down and excised this mass in toto.  As I got it out, it appeared that there was some granulation tissue around it which I removed.  I found some hair which was consistent with a little pilonidal-type cyst.  I removed all this.  I sent the mass and what was the dark part appeared to be an area that involved hair.  It was concern that this was a superficial node, maybe carrying _______ from a nearby dysplastic nevus.  This was all sent for permanent section.  The wound was closed with 4-0 Vicryl and 5-0 Vicryl subcuticularly with Benzoin and Steri-Strips.  The patient tolerated the procedure well.  She will be given some Vicodin for pain and will be followed in the office in about two weeks. DD:  03/20/00 TD:  03/20/00 Job: [id number]

## 2011-01-20 NOTE — Assessment & Plan Note (Signed)
[facility]                                   ON-CALL NOTE   NAME:JOHNSONSanika, [name]                      MRN:          [id number]  DATE:05/07/2006                            DOB:          10-Dec-1970    TIME OF INTERACTION:  10:42 a.m.   PHONE NUMBER:  [phone].   OBJECTIVE:  The patient has a bladder infection and was seen Thursday at  [facility].  Had frequency with difficulty urinating but also has stomach  virus, fever to 100 or more, vomiting, diarrhea, and abdominal cramping,  with urgency and frequency.  Was found to have a urinary infection and was  treated for that with Levaquin 500 a day.  Since that time, her fever  improved but never went away.  Her nausea continues.  Her vomiting has  stopped.  Her diarrhea has improved.  Frequency continues with urination,  although she does not hurt as much and does not feel significantly better.   ASSESSMENT:  Urinary tract infection with what appears to be acute  gastroenteritis, possibly resistant to fluoroquinolone.   PLAN:  Come in to be seen at the office now.   PRIMARY CARE PHYSICIAN:  Marne A. Milinda Antis, M.D.  Home office is [facility].                                   Arta Silence, MD   RNS/MedQ  DD:  05/07/2006  DT:  05/07/2006  Job #:  [id number]
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
## 2011-02-15 ENCOUNTER — Other Ambulatory Visit (HOSPITAL_COMMUNITY)
Admission: RE | Admit: 2011-02-15 | Discharge: 2011-02-15 | Disposition: A | Discharge status: Home / Self Care | Payer: BC Managed Care – PPO | Source: Ambulatory Visit | Attending: Obstetrics and Gynecology | Admitting: Obstetrics and Gynecology

## 2011-02-15 ENCOUNTER — Ambulatory Visit (INDEPENDENT_AMBULATORY_CARE_PROVIDER_SITE_OTHER): Payer: BC Managed Care – PPO | Admitting: Obstetrics and Gynecology

## 2011-02-15 ENCOUNTER — Other Ambulatory Visit: Payer: Self-pay | Admitting: Obstetrics and Gynecology

## 2011-02-15 ENCOUNTER — Other Ambulatory Visit: Payer: BC Managed Care – PPO

## 2011-02-15 DIAGNOSIS — N854 Malposition of uterus: Secondary | ICD-10-CM

## 2011-02-15 DIAGNOSIS — Z13220 Encounter for screening for lipoid disorders: Secondary | ICD-10-CM

## 2011-02-15 DIAGNOSIS — R82998 Other abnormal findings in urine: Secondary | ICD-10-CM

## 2011-02-15 DIAGNOSIS — Z124 Encounter for screening for malignant neoplasm of cervix: Secondary | ICD-10-CM | POA: Insufficient documentation

## 2011-02-15 DIAGNOSIS — D3910 Neoplasm of uncertain behavior of unspecified ovary: Secondary | ICD-10-CM

## 2011-02-15 DIAGNOSIS — N8300 Follicular cyst of ovary, unspecified side: Secondary | ICD-10-CM

## 2011-02-15 DIAGNOSIS — Z01419 Encounter for gynecological examination (general) (routine) without abnormal findings: Secondary | ICD-10-CM

## 2011-05-18 ENCOUNTER — Ambulatory Visit (INDEPENDENT_AMBULATORY_CARE_PROVIDER_SITE_OTHER): Payer: BC Managed Care – PPO | Admitting: Obstetrics and Gynecology

## 2011-05-18 ENCOUNTER — Other Ambulatory Visit: Payer: Self-pay | Admitting: Obstetrics and Gynecology

## 2011-05-18 ENCOUNTER — Inpatient Hospital Stay
Admission: RE | Admit: 2011-05-18 | Discharge: 2011-05-18 | Disposition: A | Discharge status: Home / Self Care | Payer: BC Managed Care – PPO | Source: Ambulatory Visit | Attending: Obstetrics and Gynecology | Admitting: Obstetrics and Gynecology

## 2011-05-18 DIAGNOSIS — D3910 Neoplasm of uncertain behavior of unspecified ovary: Secondary | ICD-10-CM

## 2011-05-18 DIAGNOSIS — N83201 Unspecified ovarian cyst, right side: Secondary | ICD-10-CM

## 2011-05-18 DIAGNOSIS — M545 Low back pain, unspecified: Secondary | ICD-10-CM

## 2011-05-18 DIAGNOSIS — N83209 Unspecified ovarian cyst, unspecified side: Secondary | ICD-10-CM

## 2011-05-18 DIAGNOSIS — N949 Unspecified condition associated with female genital organs and menstrual cycle: Secondary | ICD-10-CM

## 2011-05-18 NOTE — Progress Notes (Signed)
The patient came back today for further followup of her ovarian cysts. On ultrasound she has an anteverted uterus with a very small fibroid. Her endometrial echo is 6.3 mm. On her right ovary there is a solid mass of 1.5 cm,  there is calcification in the wall of the cyst.there is no blood flow to the cyst. There is some fluid surrounding the ovary. This cyst was present in June and was slightly larger at 1.8 cm. On her left ovary she has a 1.5 cm solid mass with calcification associated with that. There is no blood flow to this cyst either. This cyst was also present in June and is unchanged in size. Patient CA 125 in June was normal. Patient does have some lower abdominal discomfort which is not severe. She is seeing a urologist did not think it was urological. Patient had a laparoscopy for a hemorrhagic cyst when she was 18. I did a laparoscopy on her in 2008 and removed bilateral serous cyst adenofibroma's which were benign. I have sent the patient to Dr. Noland Fordyce for second opinion. He sure her last November and recommended observation and not surgery.  Assessment: Bilateral ovarian cysts most consistent with dermoids, unchanged  Plan: I told the patient and her father I favored expectant management. I told her some the risk of her having surgery again. These include need for laparotomy if we cannot do it laparoscopically. I think she favors having surgery in spite of this. I suggested she also consider seeing Dr. Noland Fordyce again for a second opinion. She will think it over and if she decides to watch it she was putting computer recall for 6 months.

## 2011-06-09 LAB — POCT HEMOGLOBIN-HEMACUE: Hemoglobin: 12.1 g/dL (ref 12.0–15.0)

## 2011-06-20 LAB — HCG, SERUM, QUALITATIVE: Preg, Serum: NEGATIVE

## 2011-06-20 LAB — URINALYSIS, ROUTINE W REFLEX MICROSCOPIC
Bilirubin Urine: NEGATIVE
Glucose, UA: NEGATIVE
Hgb urine dipstick: NEGATIVE
Ketones, ur: NEGATIVE
Nitrite: NEGATIVE
Protein, ur: NEGATIVE
Specific Gravity, Urine: 1.021
Urobilinogen, UA: 0.2
pH: 7

## 2011-06-20 LAB — CBC
HCT: 37.6
Hemoglobin: 12.7
MCHC: 33.8
MCV: 81.7
Platelets: 261
RBC: 4.6
RDW: 13.9
WBC: 7.6

## 2011-11-12 ENCOUNTER — Other Ambulatory Visit: Payer: Self-pay | Admitting: Obstetrics and Gynecology

## 2012-01-10 ENCOUNTER — Encounter: Payer: Self-pay | Admitting: *Deleted

## 2012-01-31 ENCOUNTER — Other Ambulatory Visit: Payer: Self-pay | Admitting: Obstetrics and Gynecology

## 2012-02-16 ENCOUNTER — Encounter: Payer: Self-pay | Admitting: Gynecology

## 2012-02-16 DIAGNOSIS — A498 Other bacterial infections of unspecified site: Secondary | ICD-10-CM | POA: Insufficient documentation

## 2012-02-22 ENCOUNTER — Ambulatory Visit (INDEPENDENT_AMBULATORY_CARE_PROVIDER_SITE_OTHER): Payer: Self-pay | Admitting: Obstetrics and Gynecology

## 2012-02-22 ENCOUNTER — Ambulatory Visit (INDEPENDENT_AMBULATORY_CARE_PROVIDER_SITE_OTHER): Payer: Self-pay

## 2012-02-22 ENCOUNTER — Other Ambulatory Visit: Payer: Self-pay | Admitting: Obstetrics and Gynecology

## 2012-02-22 ENCOUNTER — Encounter: Payer: Self-pay | Admitting: Obstetrics and Gynecology

## 2012-02-22 DIAGNOSIS — N8300 Follicular cyst of ovary, unspecified side: Secondary | ICD-10-CM

## 2012-02-22 DIAGNOSIS — R1031 Right lower quadrant pain: Secondary | ICD-10-CM

## 2012-02-22 DIAGNOSIS — R1903 Right lower quadrant abdominal swelling, mass and lump: Secondary | ICD-10-CM

## 2012-02-22 DIAGNOSIS — N838 Other noninflammatory disorders of ovary, fallopian tube and broad ligament: Secondary | ICD-10-CM

## 2012-02-22 DIAGNOSIS — N83209 Unspecified ovarian cyst, unspecified side: Secondary | ICD-10-CM

## 2012-02-22 DIAGNOSIS — D3910 Neoplasm of uncertain behavior of unspecified ovary: Secondary | ICD-10-CM

## 2012-02-22 DIAGNOSIS — R1032 Left lower quadrant pain: Secondary | ICD-10-CM

## 2012-02-22 DIAGNOSIS — N839 Noninflammatory disorder of ovary, fallopian tube and broad ligament, unspecified: Secondary | ICD-10-CM

## 2012-02-22 NOTE — Progress Notes (Signed)
Patient came back today for followup ultrasound. On ultrasound today her uterus is retroverted. Her endometrial echo is 3.9 mm. She is on continuous birth control pills. On her right ovary there is a solid mass of 1.4 cm which is negative for PFD. We have been watching this and has not changed over 3 ultrasounds other than getting a little smaller. Her left ovary is difficult to image but the mass previously seen is  Gone. The patient initially had seen Dr. Noland Fordyce who had recommended observation. Now that she is amenorrheic most of the pain she was having before is gone and she is much happier. We've reassured her today. I offered to send her back for continued second opinion but she declined. She actually will see me next week for annual exam. We will probably  repeat her ultrasound in 6 months but we will discuss that next week.

## 2012-02-27 ENCOUNTER — Ambulatory Visit (INDEPENDENT_AMBULATORY_CARE_PROVIDER_SITE_OTHER): Payer: Self-pay | Admitting: Obstetrics and Gynecology

## 2012-02-27 ENCOUNTER — Encounter: Payer: Self-pay | Admitting: Obstetrics and Gynecology

## 2012-02-27 VITALS — BP 126/76 | Ht 61.0 in | Wt 206.0 lb

## 2012-02-27 DIAGNOSIS — N946 Dysmenorrhea, unspecified: Secondary | ICD-10-CM

## 2012-02-27 DIAGNOSIS — N83209 Unspecified ovarian cyst, unspecified side: Secondary | ICD-10-CM

## 2012-02-27 DIAGNOSIS — Z01419 Encounter for gynecological examination (general) (routine) without abnormal findings: Secondary | ICD-10-CM

## 2012-02-27 MED ORDER — DROSPIRENONE-ETHINYL ESTRADIOL 3-0.03 MG PO TABS: ORAL_TABLET | ORAL | Status: DC

## 2012-02-27 NOTE — Progress Notes (Signed)
The patient came back to see me today for an annual GYN exam. We have seen her last week for ultrasound followup. Please see that encounter. She remains on generic Yasmin which she takes continuously to avoid cycles. She is very happy on this regime. She no longer has dysmenorrhea or heavy periods. She is having no pelvic pain. She is having no vaginal bleeding. She is not sexually active. She has always had normal Pap smears. Her last Pap smear was June of 2012. She's never had a mammogram. She does her lab work elsewhere.  Physical examination:Kim Julian Reil present. HEENT within normal limits. Neck: Thyroid not large. No masses. Supraclavicular nodes: not enlarged. Breasts: Examined in both sitting and lying  position. No skin changes and no masses. Abdomen: Soft no guarding rebound or masses or hernia. Pelvic: External: Within normal limits. BUS: Within normal limits. Vaginal:within normal limits. Good estrogen effect. No evidence of cystocele rectocele or enterocele. Cervix: clean. Uterus: Normal size and shape. Adnexa: No masses. Rectovaginal exam: Confirmatory and negative. Extremities: Within normal limits.  Assessment: Persistent right ovarian cyst. Dysmenorrhea and menorrhagia controlled with continuous birth control pills.  Plan: Continue birth control pills as above. Mammogram at 42. Discussed a higher risk of DVT with Yasmin. Offered to change  her birth control pills. Patient has had trouble with many other birth control pills and declined. Ultrasound recall 6 months.

## 2012-02-28 LAB — URINALYSIS W MICROSCOPIC + REFLEX CULTURE
Bacteria, UA: NONE SEEN
Bilirubin Urine: NEGATIVE
Casts: NONE SEEN
Glucose, UA: NEGATIVE mg/dL
Hgb urine dipstick: NEGATIVE
Ketones, ur: NEGATIVE mg/dL
Leukocytes, UA: NEGATIVE
Nitrite: NEGATIVE
Protein, ur: NEGATIVE mg/dL
Specific Gravity, Urine: 1.03 (ref 1.005–1.030)
Urobilinogen, UA: 0.2 mg/dL (ref 0.0–1.0)
pH: 6 (ref 5.0–8.0)

## 2012-02-29 LAB — URINE CULTURE: Colony Count: 3000

## 2012-03-04 ENCOUNTER — Other Ambulatory Visit: Payer: BC Managed Care – PPO

## 2012-03-04 ENCOUNTER — Ambulatory Visit: Payer: BC Managed Care – PPO | Admitting: Obstetrics and Gynecology

## 2012-08-26 ENCOUNTER — Ambulatory Visit (INDEPENDENT_AMBULATORY_CARE_PROVIDER_SITE_OTHER): Payer: BC Managed Care – PPO

## 2012-08-26 ENCOUNTER — Ambulatory Visit (INDEPENDENT_AMBULATORY_CARE_PROVIDER_SITE_OTHER): Payer: BC Managed Care – PPO | Admitting: Obstetrics and Gynecology

## 2012-08-26 ENCOUNTER — Other Ambulatory Visit: Payer: Self-pay | Admitting: Obstetrics and Gynecology

## 2012-08-26 DIAGNOSIS — D259 Leiomyoma of uterus, unspecified: Secondary | ICD-10-CM

## 2012-08-26 DIAGNOSIS — N839 Noninflammatory disorder of ovary, fallopian tube and broad ligament, unspecified: Secondary | ICD-10-CM

## 2012-08-26 DIAGNOSIS — D3910 Neoplasm of uncertain behavior of unspecified ovary: Secondary | ICD-10-CM

## 2012-08-26 DIAGNOSIS — N854 Malposition of uterus: Secondary | ICD-10-CM

## 2012-08-26 DIAGNOSIS — N8300 Follicular cyst of ovary, unspecified side: Secondary | ICD-10-CM

## 2012-08-26 DIAGNOSIS — N83209 Unspecified ovarian cyst, unspecified side: Secondary | ICD-10-CM

## 2012-08-26 DIAGNOSIS — O34519 Maternal care for incarceration of gravid uterus, unspecified trimester: Secondary | ICD-10-CM

## 2012-08-26 DIAGNOSIS — D252 Subserosal leiomyoma of uterus: Secondary | ICD-10-CM

## 2012-08-26 NOTE — Patient Instructions (Signed)
Follow up ultrasound in 6 months.

## 2012-08-26 NOTE — Progress Notes (Signed)
Patient came to see me today for further followup of a right ovarian cyst. This cyst was first diagnosed on ultrasound in 2011. At that time it was solid and was 1.7 cm. It was smooth thin wall and was avascular. I told the patient that we should watch it. She saw her Dr. Soledad Gerlach at [facility] who agreed.Since then she's had four followup ultrasounds without significant change. Sometimes it'll measure a little larger but never greater than 2 cm. The last several ultrasounds there appears to be a cystic component. On ultrasound today her uterus continues to show small fibroids. Her endometrial echo is 4.4 mm. Her left ovary is normal except for microcalcifications. On her right ovary there is a 1.6 cm cyst which appears to have both a cystic and solid component. It is avascular. It is thin walled. Her cul-de-sac remains free of fluid.  We discussed this in detail with her mother present. I still think it is probably nonsuspicious. I offered to send her back to [facility] for repeat second opinion but she declined. She is asymptomatic. We decided to just do another ultrasound in 6 months.

## 2012-09-04 DIAGNOSIS — A498 Other bacterial infections of unspecified site: Secondary | ICD-10-CM

## 2012-09-04 HISTORY — DX: Other bacterial infections of unspecified site: A49.8

## 2013-01-09 ENCOUNTER — Other Ambulatory Visit: Payer: Self-pay | Admitting: Gynecology

## 2013-01-09 DIAGNOSIS — N83209 Unspecified ovarian cyst, unspecified side: Secondary | ICD-10-CM

## 2013-01-09 DIAGNOSIS — D259 Leiomyoma of uterus, unspecified: Secondary | ICD-10-CM

## 2013-01-16 ENCOUNTER — Other Ambulatory Visit: Payer: Self-pay | Admitting: Obstetrics and Gynecology

## 2013-02-26 ENCOUNTER — Encounter: Payer: Self-pay | Admitting: Gynecology

## 2013-02-26 ENCOUNTER — Ambulatory Visit (INDEPENDENT_AMBULATORY_CARE_PROVIDER_SITE_OTHER): Payer: BC Managed Care – PPO | Admitting: Gynecology

## 2013-02-26 ENCOUNTER — Ambulatory Visit (INDEPENDENT_AMBULATORY_CARE_PROVIDER_SITE_OTHER): Payer: BC Managed Care – PPO

## 2013-02-26 DIAGNOSIS — N83201 Unspecified ovarian cyst, right side: Secondary | ICD-10-CM | POA: Insufficient documentation

## 2013-02-26 DIAGNOSIS — N838 Other noninflammatory disorders of ovary, fallopian tube and broad ligament: Secondary | ICD-10-CM

## 2013-02-26 DIAGNOSIS — N83209 Unspecified ovarian cyst, unspecified side: Secondary | ICD-10-CM

## 2013-02-26 DIAGNOSIS — D252 Subserosal leiomyoma of uterus: Secondary | ICD-10-CM

## 2013-02-26 DIAGNOSIS — D259 Leiomyoma of uterus, unspecified: Secondary | ICD-10-CM

## 2013-02-26 DIAGNOSIS — N839 Noninflammatory disorder of ovary, fallopian tube and broad ligament, unspecified: Secondary | ICD-10-CM

## 2013-02-26 NOTE — Patient Instructions (Addendum)
Ovarian Cyst  The ovaries are small organs that are on each side of the uterus. The ovaries are the organs that produce the female hormones, estrogen and progesterone. An ovarian cyst is a sac filled with fluid that can vary in its size. It is normal for a small cyst to form in women who are in the childbearing age and who have menstrual periods. This type of cyst is called a follicle cyst that becomes an ovulation cyst (corpus luteum cyst) after it produces the women's egg. It later goes away on its own if the woman does not become pregnant. There are other kinds of ovarian cysts that may cause problems and may need to be treated. The most serious problem is a cyst with cancer. It should be noted that menopausal women who have an ovarian cyst are at a higher risk of it being a cancer cyst. They should be evaluated very quickly, thoroughly and followed closely. This is especially true in menopausal women because of the high rate of ovarian cancer in women in menopause.  CAUSES AND TYPES OF OVARIAN CYSTS:   FUNCTIONAL CYST: The follicle/corpus luteum cyst is a functional cyst that occurs every month during ovulation with the menstrual cycle. They go away with the next menstrual cycle if the woman does not get pregnant. Usually, there are no symptoms with a functional cyst.   ENDOMETRIOMA CYST: This cyst develops from the lining of the uterus tissue. This cyst gets in or on the ovary. It grows every month from the bleeding during the menstrual period. It is also called a "chocolate cyst" because it becomes filled with blood that turns brown. This cyst can cause pain in the lower abdomen during intercourse and with your menstrual period.   CYSTADENOMA CYST: This cyst develops from the cells on the outside of the ovary. They usually are not cancerous. They can get very big and cause lower abdomen pain and pain with intercourse. This type of cyst can twist on itself, cut off its blood supply and cause severe pain. It  also can easily rupture and cause a lot of pain.   DERMOID CYST: This type of cyst is sometimes found in both ovaries. They are found to have different kinds of body tissue in the cyst. The tissue includes skin, teeth, hair, and/or cartilage. They usually do not have symptoms unless they get very big. Dermoid cysts are rarely cancerous.   POLYCYSTIC OVARY: This is a rare condition with hormone problems that produces many small cysts on both ovaries. The cysts are follicle-like cysts that never produce an egg and become a corpus luteum. It can cause an increase in body weight, infertility, acne, increase in body and facial hair and lack of menstrual periods or rare menstrual periods. Many women with this problem develop type 2 diabetes. The exact cause of this problem is unknown. A polycystic ovary is rarely cancerous.   THECA LUTEIN CYST: Occurs when too much hormone (human chorionic gonadotropin) is produced and over-stimulates the ovaries to produce an egg. They are frequently seen when doctors stimulate the ovaries for invitro-fertilization (test tube babies).   LUTEOMA CYST: This cyst is seen during pregnancy. Rarely it can cause an obstruction to the birth canal during labor and delivery. They usually go away after delivery.  SYMPTOMS    Pelvic pain or pressure.   Pain during sexual intercourse.   Increasing girth (swelling) of the abdomen.   Abnormal menstrual periods.   Increasing pain with menstrual periods.     You stop having menstrual periods and you are not pregnant.  DIAGNOSIS   The diagnosis can be made during:   Routine or annual pelvic examination (common).   Ultrasound.   X-ray of the pelvis.   CT Scan.   MRI.   Blood tests.  TREATMENT    Treatment may only be to follow the cyst monthly for 2 to 3 months with your caregiver. Many go away on their own, especially functional cysts.   May be aspirated (drained) with a long needle with ultrasound, or by laparoscopy (inserting a tube into  the pelvis through a small incision).   The whole cyst can be removed by laparoscopy.   Sometimes the cyst may need to be removed through an incision in the lower abdomen.   Hormone treatment is sometimes used to help dissolve certain cysts.   Birth control pills are sometimes used to help dissolve certain cysts.  HOME CARE INSTRUCTIONS   Follow your caregiver's advice regarding:   Medicine.   Follow up visits to evaluate and treat the cyst.   You may need to come back or make an appointment with another caregiver, to find the exact cause of your cyst, if your caregiver is not a gynecologist.   Get your yearly and recommended pelvic examinations and Pap tests.   Let your caregiver know if you have had an ovarian cyst in the past.  SEEK MEDICAL CARE IF:    Your periods are late, irregular, they stop, or are painful.   Your stomach (abdomen) or pelvic pain does not go away.   Your stomach becomes larger or swollen.   You have pressure on your bladder or trouble emptying your bladder completely.   You have painful sexual intercourse.   You have feelings of fullness, pressure, or discomfort in your stomach.   You lose weight for no apparent reason.   You feel generally ill.   You become constipated.   You lose your appetite.   You develop acne.   You have an increase in body and facial hair.   You are gaining weight, without changing your exercise and eating habits.   You think you are pregnant.  SEEK IMMEDIATE MEDICAL CARE IF:    You have increasing abdominal pain.   You feel sick to your stomach (nausea) and/or vomit.   You develop a fever that comes on suddenly.   You develop abdominal pain during a bowel movement.   Your menstrual periods become heavier than usual.  Document Released: 08/21/2005 Document Revised: 11/13/2011 Document Reviewed: 06/24/2009  [employer] Patient Information 2014 [employer].

## 2013-02-26 NOTE — Progress Notes (Signed)
The patient is a 42 year old who is asked to followup with an ultrasound today by my previous partner Dr. Eda Paschal  who has  retired. This patient has a long-standing history of over the course of the past 3 years of a persistent right ovarian cyst that has fluctuated in size but never greater than 2 cm. There has never been in any of the previous ultrasounds  Any mention of blood flow to the cyst. Back in June of 2012 she had a normal CA 125. Patient had a laparoscopy for a hemorrhagic cyst when she was 18. Dr. Eda Paschal had done a laparoscopy on her in 2008 and removed bilateral serous cyst adenofibroma's which were benign. She had been referred to Dr. Lindaann Pascal GYN oncologist at [facility] for second opinion and he recommended observation and no surgery.  Patient is asymptomatic. She is on generic Yasmin   continuous for menstrual cramps and heavy periods and currently not menstruating. Her last Pap smear was normal in 2012. Patient will be scheduling  her first mammogram in  the next 2 weeks. Review of her record indicated she had colon polyps several years ago and her last colonoscopy with this year was normal she is followed by Dr. Adriana Simas gastroenterologist at [facility].  Patient was seen by Dr.Gottsegen  and again in  December 2013 and review of his note is as follows: "This cyst was first diagnosed on ultrasound in 2011. At that time it was solid and was 1.7 cm. It was smooth thin wall and was avascular. I told the patient that we should watch it. She saw her Dr. Soledad Gerlach at [facility] who agreed.Since then she's had four followup ultrasounds without significant change. Sometimes it'll measure a little larger but never greater than 2 cm. The last several ultrasounds there appears to be a cystic component. On ultrasound today her uterus continues to show small fibroids. Her endometrial echo is 4.4 mm. Her left ovary is normal except for microcalcifications. On her right ovary there is a  1.6 cm cyst which appears to have both a cystic and solid component. It is avascular. It is thin walled. Her cul-de-sac remains free of fluid."  She had been offered to return back to GYN oncologist for follow up decided to proceed with an ultrasound in 6 months which she had done today here in our office. Ultrasound today demonstrated the following: Uterus measures 7.0 x 4.3 x 4.5 cm with endometrial stripe of 2.3 mm. A small subserous fibroid measuring 14 x 16 mm was noted. Right ovary continued presence of solid/cystic mass measuring 19 x 19 x 18 mm average size 19 mm with calcification measuring 6 mm. Increased slightly in size from last ultrasound this time a feeder vessel was noted going to the mass. Left ovary appeared to be normal no fluid in the cul-de-sac.  I discussed with the patient the previous ultrasound scans but the only change this time that color flow Doppler demonstrated a feeder vessel going to the mass which made me uncomfortable and I have recommended that she get a second opinion with a different oncologist and we will make arrangements with the [facility] oncology group who will have access to this ultrasound report and these notes. We will obtain a CA 125 today its limitations were discussed with the patient. Once again patient then totally asymptomatic. We'll await further recommendation from a different oncologist.

## 2013-02-27 ENCOUNTER — Telehealth: Payer: Self-pay | Admitting: *Deleted

## 2013-02-27 LAB — CA 125: CA 125: 16.9 U/mL (ref 0.0–30.2)

## 2013-02-27 NOTE — Telephone Encounter (Signed)
Appointment 03/28/13 @ 2:30 pm

## 2013-02-27 NOTE — Telephone Encounter (Signed)
Victorino Dike, please schedule appointment for this patient with the [facility] GYN oncology group for consultation in reference to persistent right ovarian cyst. Please see my last entry in Epic    I spoke with Lady Gary at [facility] and she faxed a referral form to me and I sent that along with notes and labs. Katrina will contact me with time and date.

## 2013-03-03 ENCOUNTER — Other Ambulatory Visit: Payer: Self-pay | Admitting: Gynecology

## 2013-03-03 DIAGNOSIS — N83209 Unspecified ovarian cyst, unspecified side: Secondary | ICD-10-CM

## 2013-03-03 DIAGNOSIS — N838 Other noninflammatory disorders of ovary, fallopian tube and broad ligament: Secondary | ICD-10-CM

## 2013-03-28 ENCOUNTER — Ambulatory Visit: Payer: BC Managed Care – PPO | Attending: Gynecology | Admitting: Gynecology

## 2013-03-28 ENCOUNTER — Encounter: Payer: Self-pay | Admitting: Gynecology

## 2013-03-28 VITALS — BP 120/80 | HR 64 | Temp 98.2°F | Resp 16

## 2013-03-28 DIAGNOSIS — N83209 Unspecified ovarian cyst, unspecified side: Secondary | ICD-10-CM

## 2013-03-28 DIAGNOSIS — Z79899 Other long term (current) drug therapy: Secondary | ICD-10-CM | POA: Insufficient documentation

## 2013-03-28 DIAGNOSIS — I10 Essential (primary) hypertension: Secondary | ICD-10-CM | POA: Insufficient documentation

## 2013-03-28 NOTE — Patient Instructions (Signed)
Please contact Dr. Lily Peer to schedule surgery.

## 2013-03-28 NOTE — Progress Notes (Signed)
Consult Note: Gyn-Onc   [name] 42 y.o. female  Chief Complaint  Patient presents with  . Ovarian Cyst    New Consult     Assessment : Persistent ovarian cyst with nodules, calcification, and new evidence of vascularity.  Plan: I lengthy discussion with the patient and her mother regarding the ultrasound findings and CA 125 results. It's my opinion that this is a benign ovarian neoplasm probably similar to other neoplasms and she's had removed previously. In my opinion the ovarian cyst could be followed with serial ultrasounds every 6 months. On the other hand surgical removal the cyst would be reasonable should the patient have worsening symptoms or remaining psychologically uncomfortable with a surveillance program. On further discussion, it is clear that the patient does not like to worry about the presence of the cyst and they have continued ultrasound examinations and therefore would prefer to have surgical removal. This seems perfectly reasonable to me.  The patient's records are reviewed in detail.  Patient returned to the care of Dr. Lily Rush to schedule surgery.   HPI: 42 year old white female seen in consultation the request of Dr. Lily Rush regarding management of a persistent ovarian cyst. According to records the patient has had this cyst for approximately 3 years. Throughout that time she has had a series of ultrasounds which show minimal changes in fluctuation in size the cyst. A CA 125 was obtained in June 2014 which was 16 units per mL. The most recent ultrasound shows the cyst to be solid and cystic measuring 19 x 19 x 18 mm with some calcifications. Color flow Doppler shows a "feeder" vessel to the mass.  The patient admits to having intermittent right lower quadrant pain. She has been under suppression with continuous oral contraceptives.  She has a past history of a removal of a hemorrhagic cyst that she was 42 years of age. Further, she underwent laparoscopy in  2008 removing bilateral serous cyst adenofibromas.  Review of Systems:10 point review of systems is negative except as noted in interval history.   Vitals: Blood pressure 120/80, pulse 64, temperature 98.2 F (36.8 C), temperature source Oral, resp. rate 16, last menstrual period 04/05/2011.  Physical Exam: General : The patient is a healthy woman in no acute distress.   Abdominal and pelvic exam are deferred.  Lower extremities: No edema or varicosities. Normal range of motion      Allergies  Allergen Reactions  . Allegra (Fexofenadine Hcl)   . Diphenhydramine Hcl   . Doxycycline   . Morphine     REACTION: insomnia  . Oseltamivir Phosphate   . Other     NUT ALLERGY  . Penicillins   . Sulfonamide Derivatives   . Vancomycin     Past Medical History  Diagnosis Date  . Arrhythmia   . Mononucleosis 5-10  . Ovarian cyst     Serous Cystadenofibroma-Left ovary  . Clostridium difficile infection   . Mold contact confirmed     Toxic Mold syndrome  . Immune deficiency disorder   . Anxiety   . Asthma   . GERD (gastroesophageal reflux disease)   . Hypertension   . Thyroid disease     Past Surgical History  Procedure Laterality Date  . Tonsillectomy  2009  . Fecal transplant    . Groin cyst    . Nose cyst    . Pelvic laparoscopy      DL Ovarian cystectomy-Left  . Cholecystectomy      Current Outpatient Prescriptions  Medication Sig Dispense Refill  . ALPRAZolam (XANAX) 1 MG tablet Take 1 mg by mouth at bedtime as needed.      . bisoprolol (ZEBETA) 10 MG tablet Take 10 mg by mouth daily.      . Cholecalciferol (VITAMIN D PO) Take by mouth.      . citalopram (CELEXA) 40 MG tablet Take 40 mg by mouth daily.      . cyclobenzaprine (FLEXERIL) 10 MG tablet Take 10 mg by mouth at bedtime.      . fentaNYL (DURAGESIC - DOSED MCG/HR) 25 MCG/HR Place 1 patch onto the skin every 3 (three) days.      Marland Kitchen HYOSCYAMINE PO Take by mouth.      . OCELLA 3-0.03 MG tablet TAKE ONE  ACTIVE PILL CONTINUOUSLY WITH NO BREAK.  84 tablet  4  . oxyCODONE-acetaminophen (PERCOCET) 5-325 MG per tablet Take 0.5 tablets by mouth daily.      . pantoprazole (PROTONIX) 20 MG tablet Take 20 mg by mouth daily.      . Probiotic Product (ALIGN PO) Take by mouth as needed.      . prochlorperazine (COMPAZINE) 10 MG tablet Take 10 mg by mouth every 6 (six) hours as needed.      . promethazine (PHENERGAN) 25 MG tablet Take 25 mg by mouth every 6 (six) hours as needed.      . thyroid (ARMOUR) 30 MG tablet Take 30 mg by mouth daily before breakfast.      . vitamin B-12 (CYANOCOBALAMIN) 500 MCG tablet Take 500 mcg by mouth daily.      Marland Kitchen esomeprazole (NEXIUM) 40 MG capsule Take 40 mg by mouth as needed.      Marland Kitchen LORazepam (ATIVAN) 0.5 MG tablet Take 0.5 mg by mouth as needed.      . mupirocin nasal ointment (BACTROBAN) 2 % Place into the nose 2 (two) times daily. Use one-half of tube in each nostril twice daily for five (5) days. After application, press sides of nose together and gently massage.      . Naltrexone HCl (DEPADE PO) Take by mouth. 1.25mg   daily       No current facility-administered medications for this visit.    History   Social History  . Marital Status: Single    Spouse Name: N/A    Number of Children: N/A  . Years of Education: N/A   Occupational History  . Not on file.   Social History Main Topics  . Smoking status: Never Smoker   . Smokeless tobacco: Never Used  . Alcohol Use: No  . Drug Use: No  . Sexually Active: No   Other Topics Concern  . Not on file   Social History Narrative  . No narrative on file    Family History  Problem Relation Age of Onset  . Hypertension Mother   . Diabetes Mother   . Breast cancer Maternal Aunt     Age 40  . Pancreatic cancer Maternal Grandmother   . Heart disease Maternal Grandfather   . Breast cancer Paternal Grandmother     Age 26      CLARKE-PEARSON,Beth Zidek L, MD 03/28/2013, 3:53 PM

## 2013-05-01 ENCOUNTER — Encounter: Payer: Self-pay | Admitting: Gynecology

## 2013-05-01 ENCOUNTER — Ambulatory Visit (INDEPENDENT_AMBULATORY_CARE_PROVIDER_SITE_OTHER): Payer: BC Managed Care – PPO | Admitting: Gynecology

## 2013-05-01 VITALS — BP 128/86

## 2013-05-01 DIAGNOSIS — N83201 Unspecified ovarian cyst, right side: Secondary | ICD-10-CM

## 2013-05-01 DIAGNOSIS — R102 Pelvic and perineal pain: Secondary | ICD-10-CM

## 2013-05-01 DIAGNOSIS — N83209 Unspecified ovarian cyst, unspecified side: Secondary | ICD-10-CM

## 2013-05-01 DIAGNOSIS — Z01818 Encounter for other preprocedural examination: Secondary | ICD-10-CM

## 2013-05-01 DIAGNOSIS — N949 Unspecified condition associated with female genital organs and menstrual cycle: Secondary | ICD-10-CM

## 2013-05-01 NOTE — Progress Notes (Signed)
[name] is an 42 y.o. female. Patient was to see the office on 02/26/2013 before [name] had been referred to the GYN oncologist Dr. Kerri Perches. My last office note as follows:  "the patient is a 42 year old who was seen on June 25 for ultrasound.This patient has a long-standing history of over the course of the past 3 years of a persistent right ovarian cyst that has fluctuated in size but never greater than 2 cm. There has never been in any of the previous ultrasounds Any mention of blood flow to the cyst. Back in June of 2012 [name] had a normal CA 125. Patient had a laparoscopy for a hemorrhagic cyst when [name] was 18. Dr. Eda Paschal had done a laparoscopy on her in 2008 and removed bilateral serous cyst adenofibroma's which were benign. [name] had been referred to Dr. Hazle Coca  GYN oncologist at [facility] for second opinion and he recommended observation and no surgery.  Patient has been asymptomatic and has been on generic Yasmin although at times [name] does feel some right lower quadrant point and twinging sensations. [name] takes the oral contraceptive pill continuously and is not menstruating. Her Pap smears have been normal the last one in 2012.Review of her record indicated [name] had colon polyps several years ago and her last colonoscopy with this year was normal [name] is followed by Dr. Adriana Simas gastroenterologist at [facility]. "  Office visit with my partner Dr. Eda Paschal in December of 2013 he had described the following:  "This cyst was first diagnosed on ultrasound in 2011. At that time it was solid and was 1.7 cm. It was smooth thin wall and was avascular. I told the patient that we should watch it. [name] saw her Dr. Soledad Gerlach at [facility] who agreed.Since then [name]'s had four followup ultrasounds without significant change. Sometimes it'll measure a little larger but never greater than 2 cm. The last several ultrasounds there appears to be a cystic component. On ultrasound today  her uterus continues to show small fibroids. Her endometrial echo is 4.4 mm. Her left ovary is normal except for microcalcifications. On her right ovary there is a 1.6 cm cyst which appears to have both a cystic and solid component. It is avascular. It is thin walled. Her cul-de-sac remains free of fluid."  followup ultrasound in our office on 02/26/2013 for followup comparison to previous ultrasound of 6 month prior as follows: Uterus measures 7.0 x 4.3 x 4.5 cm with endometrial stripe of 2.3 mm. A small subserous fibroid measuring 14 x 16 mm was noted. Right ovary continued presence of solid/cystic mass measuring 19 x 19 x 18 mm average size 19 mm with calcification measuring 6 mm. Increased slightly in size from last ultrasound this time a feeder vessel was noted going to the mass. Left ovary appeared to be normal no fluid in the cul-de-sac.  Patient had a normal C1 25 and an office visit.  Patient was referred to [facility] GYN oncologists Dr. Kerri Perches for a second opinion and according from his last office note as follows:  "Persistent ovarian cyst with nodules, calcification, and new evidence of vascularity. lengthy discussion with the patient and her mother regarding the ultrasound findings and CA 125 results. It's my opinion that this is a benign ovarian neoplasm probably similar to other neoplasms and [name]'s had removed previously. In my opinion the ovarian cyst could be followed with serial ultrasounds every 6 months. On the other hand surgical removal the  cyst would be reasonable should the patient have worsening symptoms or remaining psychologically uncomfortable with a surveillance program. On further discussion, it is clear that the patient does not like to worry about the presence of the cyst and they have continued ultrasound examinations and therefore would prefer to have surgical removal. This seems perfectly reasonable to me."  Patient presented today to schedule her right ovary and  cystectomy possible right oophorectomy possible right salpingo-oophorectomy.[name] is decided that [name] does not want to wait any further or watching.  Patient with prior history of laparoscopic left ovarian cystectomy versus adenofibroma  Pertinent Gynecological History: Menses: No menses Bleeding: no menses Contraception: OCP (estrogen/progesterone) DES exposure: denies Blood transfusions: none Sexually transmitted diseases: no past history Previous GYN Procedures: Diagnostic laparoscopy and left ovarian cystectomy  Last mammogram: Niece to schedule Date: needs to schedule Last pap: normal Date: 2012 OB History: G0, P0   Menstrual History: Menarche age: 11  No LMP recorded. Patient is not currently having periods (Reason: Oral contraceptives).    Past Medical History  Diagnosis Date  . Arrhythmia   . Mononucleosis 5-10  . Ovarian cyst     Serous Cystadenofibroma-Left ovary  . Clostridium difficile infection   . Mold contact confirmed     Toxic Mold syndrome  . Immune deficiency disorder   . Anxiety   . Asthma   . GERD (gastroesophageal reflux disease)   . Hypertension   . Thyroid disease     Past Surgical History  Procedure Laterality Date  . Tonsillectomy  2009  . Fecal transplant    . Groin cyst    . Nose cyst    . Pelvic laparoscopy      DL Ovarian cystectomy-Left  . Cholecystectomy      Family History  Problem Relation Age of Onset  . Hypertension Mother   . Diabetes Mother   . Breast cancer Maternal Aunt     Age 43  . Pancreatic cancer Maternal Grandmother   . Heart disease Maternal Grandfather   . Breast cancer Paternal Grandmother     Age 63    Social History:  reports that [name] has never smoked. [name] has never used smokeless tobacco. [name] reports that [name] does not drink alcohol or use illicit drugs.  Allergies:  Allergies  Allergen Reactions  . Allegra [Fexofenadine Hcl]   . Diphenhydramine Hcl   . Doxycycline   . Morphine     REACTION:  insomnia  . Oseltamivir Phosphate   . Other     NUT ALLERGY  . Penicillins   . Sulfonamide Derivatives   . Vancomycin      (Not in a hospital admission)  REVIEW OF SYSTEMS: A ROS was performed and pertinent positives and negatives are included in the history.  GENERAL: No fevers or chills. HEENT: No change in vision, no earache, sore throat or sinus congestion. NECK: No pain or stiffness. CARDIOVASCULAR: No chest pain or pressure. No palpitations. PULMONARY: No shortness of breath, cough or wheeze. GASTROINTESTINAL: No abdominal pain, nausea, vomiting or diarrhea, melena or bright red blood per rectum. GENITOURINARY: No urinary frequency, urgency, hesitancy or dysuria. MUSCULOSKELETAL: No joint or muscle pain, no back pain, no recent trauma. DERMATOLOGIC: No rash, no itching, no lesions. ENDOCRINE: No polyuria, polydipsia, no heat or cold intolerance. No recent change in weight. HEMATOLOGICAL: No anemia or easy bruising or bleeding. NEUROLOGIC: No headache, seizures, numbness, tingling or weakness. PSYCHIATRIC: No depression, no loss of interest in normal activity or change in sleep  pattern.     Blood pressure 128/86.  Physical Exam:  HEENT:unremarkable Neck:Supple, midline, no thyroid megaly, no carotid bruits Lungs:  Clear to auscultation no rhonchi's or wheezes Heart:Regular rate and rhythm, no murmurs or gallops Breast Exam:not examine Abdomen:soft nontender no rebound or guarding Pelvic:BUSwithin normal limits Vagina:no lesions or discharge Cervix:no lesions or discharge Uterus:anteverted normal size shape and consistency Adnexa:no masses or tenderness Extremities: No cords, no edema Rectal:not examine  No results found for this or any previous visit (from the past 24 hour(s)).  No results found.  Assessment/Plan: Persistent right ovarian cyst with nodules, calcification and new evidence of vascularity will be scheduled to undergo a right ovarian cystectomy possible right  oophorectomy possible right salpingo-oophorectomy. Patient returned to the office a few days before her surgery to compare with previous scans 3 months prior to make sure that there has not been any resolution to this ovarian cyst.                        Patient was counseled as to the risk of surgery to include the following:  1. Infection (prohylactic antibiotics will be administered)  2. DVT/Pulmonary Embolism (prophylactic pneumo compression stockings will be used)  3.Trauma to internal organs requiring additional surgical procedure to repair any injury to     Internal organs requiring perhaps additional hospitalization days.  4.Hemmorhage requiring transfusion and blood products which carry risks such as   anaphylactic reaction, hepatitis and AIDS  Patient had received literature information on the procedure scheduled and all her questions were answered and fully accepts all risk.  [facility] HMD1:48 PMTD@  Gearlene Godsil H 05/01/2013, 1:36 PM

## 2013-05-01 NOTE — Patient Instructions (Signed)
Ovarian Cyst The ovaries are small organs that are on each side of the uterus. The ovaries are the organs that produce the female hormones, estrogen and progesterone. An ovarian cyst is a sac filled with fluid that can vary in its size. It is normal for a small cyst to form in women who are in the childbearing age and who have menstrual periods. This type of cyst is called a follicle cyst that becomes an ovulation cyst (corpus luteum cyst) after it produces the women's egg. It later goes away on its own if the woman does not become pregnant. There are other kinds of ovarian cysts that may cause problems and may need to be treated. The most serious problem is a cyst with cancer. It should be noted that menopausal women who have an ovarian cyst are at a higher risk of it being a cancer cyst. They should be evaluated very quickly, thoroughly and followed closely. This is especially true in menopausal women because of the high rate of ovarian cancer in women in menopause. CAUSES AND TYPES OF OVARIAN CYSTS:  FUNCTIONAL CYST: The follicle/corpus luteum cyst is a functional cyst that occurs every month during ovulation with the menstrual cycle. They go away with the next menstrual cycle if the woman does not get pregnant. Usually, there are no symptoms with a functional cyst.  ENDOMETRIOMA CYST: This cyst develops from the lining of the uterus tissue. This cyst gets in or on the ovary. It grows every month from the bleeding during the menstrual period. It is also called a "chocolate cyst" because it becomes filled with blood that turns brown. This cyst can cause pain in the lower abdomen during intercourse and with your menstrual period.  CYSTADENOMA CYST: This cyst develops from the cells on the outside of the ovary. They usually are not cancerous. They can get very big and cause lower abdomen pain and pain with intercourse. This type of cyst can twist on itself, cut off its blood supply and cause severe pain. It  also can easily rupture and cause a lot of pain.  DERMOID CYST: This type of cyst is sometimes found in both ovaries. They are found to have different kinds of body tissue in the cyst. The tissue includes skin, teeth, hair, and/or cartilage. They usually do not have symptoms unless they get very big. Dermoid cysts are rarely cancerous.  POLYCYSTIC OVARY: This is a rare condition with hormone problems that produces many small cysts on both ovaries. The cysts are follicle-like cysts that never produce an egg and become a corpus luteum. It can cause an increase in body weight, infertility, acne, increase in body and facial hair and lack of menstrual periods or rare menstrual periods. Many women with this problem develop type 2 diabetes. The exact cause of this problem is unknown. A polycystic ovary is rarely cancerous.  THECA LUTEIN CYST: Occurs when too much hormone (human chorionic gonadotropin) is produced and over-stimulates the ovaries to produce an egg. They are frequently seen when doctors stimulate the ovaries for invitro-fertilization (test tube babies).  LUTEOMA CYST: This cyst is seen during pregnancy. Rarely it can cause an obstruction to the birth canal during labor and delivery. They usually go away after delivery. SYMPTOMS   Pelvic pain or pressure.  Pain during sexual intercourse.  Increasing girth (swelling) of the abdomen.  Abnormal menstrual periods.  Increasing pain with menstrual periods.  You stop having menstrual periods and you are not pregnant. DIAGNOSIS  The diagnosis can   be made during:  Routine or annual pelvic examination (common).  Ultrasound.  X-ray of the pelvis.  CT Scan.  MRI.  Blood tests. TREATMENT   Treatment may only be to follow the cyst monthly for 2 to 3 months with your caregiver. Many go away on their own, especially functional cysts.  May be aspirated (drained) with a long needle with ultrasound, or by laparoscopy (inserting a tube into  the pelvis through a small incision).  The whole cyst can be removed by laparoscopy.  Sometimes the cyst may need to be removed through an incision in the lower abdomen.  Hormone treatment is sometimes used to help dissolve certain cysts.  Birth control pills are sometimes used to help dissolve certain cysts. HOME CARE INSTRUCTIONS  Follow your caregiver's advice regarding:  Medicine.  Follow up visits to evaluate and treat the cyst.  You may need to come back or make an appointment with another caregiver, to find the exact cause of your cyst, if your caregiver is not a gynecologist.  Get your yearly and recommended pelvic examinations and Pap tests.  Let your caregiver know if you have had an ovarian cyst in the past. SEEK MEDICAL CARE IF:   Your periods are late, irregular, they stop, or are painful.  Your stomach (abdomen) or pelvic pain does not go away.  Your stomach becomes larger or swollen.  You have pressure on your bladder or trouble emptying your bladder completely.  You have painful sexual intercourse.  You have feelings of fullness, pressure, or discomfort in your stomach.  You lose weight for no apparent reason.  You feel generally ill.  You become constipated.  You lose your appetite.  You develop acne.  You have an increase in body and facial hair.  You are gaining weight, without changing your exercise and eating habits.  You think you are pregnant. SEEK IMMEDIATE MEDICAL CARE IF:   You have increasing abdominal pain.  You feel sick to your stomach (nausea) and/or vomit.  You develop a fever that comes on suddenly.  You develop abdominal pain during a bowel movement.  Your menstrual periods become heavier than usual. Document Released: 08/21/2005 Document Revised: 11/13/2011 Document Reviewed: 06/24/2009 [facility] Patient Information 2014 [place], [place]. Diagnostic Laparoscopy Laparoscopy is a surgical procedure. It is used to diagnose  and treat diseases inside the belly(abdomen). It is usually a brief, common, and relatively simple procedure. The laparoscopeis a thin, lighted, pencil-sized instrument. It is like a telescope. It is inserted into your abdomen through a small cut (incision). Your caregiver can look at the organs inside your body through this instrument. He or she can see if there is anything abnormal. Laparoscopy can be done either in a hospital or outpatient clinic. You may be given a mild sedative to help you relax before the procedure. Once in the operating room, you will be given a drug to make you sleep (general anesthesia). Laparoscopy usually lasts less than 1 hour. After the procedure, you will be monitored in a recovery area until you are stable and doing well. Once you are home, it will take 2 to 3 days to fully recover. RISKS AND COMPLICATIONS  Laparoscopy has relatively few risks. Your caregiver will discuss the risks with you before the procedure. Some problems that can occur include:  Infection.  Bleeding.  Damage to other organs.  Anesthetic side effects. PROCEDURE Once you receive anesthesia, your surgeon inflates the abdomen with a harmless gas (carbon dioxide). This makes the organs  easier to see. The laparoscope is inserted into the abdomen through a small incision. This allows your surgeon to see into the abdomen. Other small instruments are also inserted into the abdomen through other small openings. Many surgeons attach a video camera to the laparoscope to enlarge the view. During a diagnostic laparoscopy, the surgeon may be looking for inflammation, infection, or cancer. Your surgeon may take tissue samples(biopsies). The samples are sent to a specialist in looking at cells and tissue samples (pathologist). The pathologist examines them under a microscope. Biopsies can help to diagnose or confirm a disease. AFTER THE PROCEDURE   The gas is released from inside the abdomen.  The incisions  are closed with stitches (sutures). Because these incisions are small (usually less than 1/2 inch), there is usually minimal discomfort after the procedure. There may be some mild discomfort in the throat. This is from the tube placed in the throat while you were sleeping. You may have some mild abdominal discomfort. There may also be discomfort from the instrument placement incisions in the abdomen.  The recovery time is shortened as long as there are no complications.  You will rest in a recovery room until stable and doing well. As long as there are no complications, you may be allowed to go home. FINDING OUT THE RESULTS OF YOUR TEST Not all test results are available during your visit. If your test results are not back during the visit, make an appointment with your caregiver to find out the results. Do not assume everything is normal if you have not heard from your caregiver or the medical facility. It is important for you to follow up on all of your test results. HOME CARE INSTRUCTIONS   Take all medicines as directed.  Only take over-the-counter or prescription medicines for pain, discomfort, or fever as directed by your caregiver.  Resume daily activities as directed.  Showers are preferred over baths.  You may resume sexual activities in 1 week or as directed.  Do not drive while taking narcotics. SEEK MEDICAL CARE IF:   There is increasing abdominal pain.  There is new pain in the shoulders (shoulder strap areas).  You feel lightheaded or faint.  You have the chills.  You or your child has an oral temperature above 102 F (38.9 C).  There is pus-like (purulent) drainage from any of the wounds.  You are unable to pass gas or have a bowel movement.  You feel sick to your stomach (nauseous) or throw up (vomit). MAKE SURE YOU:   Understand these instructions.  Will watch your condition.  Will get help right away if you are not doing well or get worse. Document  Released: 11/27/2000 Document Revised: 11/13/2011 Document Reviewed: 08/21/2007 [facility] Patient Information 2014 [place], [place].

## 2013-05-13 ENCOUNTER — Telehealth: Payer: Self-pay

## 2013-05-13 NOTE — Telephone Encounter (Signed)
I spoke with patient regarding scheduling her Lap R Ov Cystectomy.  She requested it to be after 06/03/13.  I scheduled her for 06/12/13 at `1:00pm at [facility].  U/S scheduled 06/09/13 per Dr. Glenetta Hew.  Patient knows to expect [facility] to be calling to schedule pre op at hospital as well.

## 2013-05-30 ENCOUNTER — Encounter (HOSPITAL_COMMUNITY): Payer: Self-pay | Admitting: Pharmacist

## 2013-06-04 ENCOUNTER — Encounter (HOSPITAL_COMMUNITY)
Admission: RE | Admit: 2013-06-04 | Discharge: 2013-06-04 | Disposition: A | Discharge status: Home / Self Care | Payer: BC Managed Care – PPO | Source: Ambulatory Visit | Attending: Gynecology | Admitting: Gynecology

## 2013-06-04 ENCOUNTER — Encounter (HOSPITAL_COMMUNITY): Payer: Self-pay

## 2013-06-04 DIAGNOSIS — Z01818 Encounter for other preprocedural examination: Secondary | ICD-10-CM | POA: Insufficient documentation

## 2013-06-04 DIAGNOSIS — Z01812 Encounter for preprocedural laboratory examination: Secondary | ICD-10-CM | POA: Insufficient documentation

## 2013-06-04 HISTORY — DX: Fibromyalgia: M79.7

## 2013-06-04 HISTORY — DX: Lyme disease, unspecified: A69.20

## 2013-06-04 HISTORY — DX: Shortness of breath: R06.02

## 2013-06-04 HISTORY — DX: Other specified postprocedural states: Z98.890

## 2013-06-04 HISTORY — DX: Hypothyroidism, unspecified: E03.9

## 2013-06-04 HISTORY — DX: Other specified postprocedural states: R11.2

## 2013-06-04 LAB — BASIC METABOLIC PANEL
BUN: 10 mg/dL (ref 6–23)
CO2: 29 mEq/L (ref 19–32)
Calcium: 8.8 mg/dL (ref 8.4–10.5)
Chloride: 102 mEq/L (ref 96–112)
Creatinine, Ser: 0.73 mg/dL (ref 0.50–1.10)
GFR calc Af Amer: >90 mL/min (ref >90)
GFR calc non Af Amer: >90 mL/min (ref >90)
Glucose, Bld: 98 mg/dL (ref 70–99)
Potassium: 3.9 mEq/L (ref 3.5–5.1)
Sodium: 139 mEq/L (ref 135–145)

## 2013-06-04 LAB — URINALYSIS, ROUTINE W REFLEX MICROSCOPIC
Bilirubin Urine: NEGATIVE
Glucose, UA: NEGATIVE mg/dL
Ketones, ur: NEGATIVE mg/dL
Leukocytes, UA: NEGATIVE
Nitrite: NEGATIVE
Protein, ur: NEGATIVE mg/dL
Specific Gravity, Urine: >1.03 — ABNORMAL HIGH (ref 1.005–1.030)
Urobilinogen, UA: 0.2 mg/dL (ref 0.0–1.0)
pH: 6 (ref 5.0–8.0)

## 2013-06-04 LAB — CBC
HCT: 37.8 % (ref 36.0–46.0)
Hemoglobin: 12.3 g/dL (ref 12.0–15.0)
MCH: 28.3 pg (ref 26.0–34.0)
MCHC: 32.5 g/dL (ref 30.0–36.0)
MCV: 87.1 fL (ref 78.0–100.0)
Platelets: 274 10*3/uL (ref 150–400)
RBC: 4.34 MIL/uL (ref 3.87–5.11)
RDW: 12.8 % (ref 11.5–15.5)
WBC: 9.1 10*3/uL (ref 4.0–10.5)

## 2013-06-04 LAB — URINE MICROSCOPIC-ADD ON

## 2013-06-04 NOTE — Patient Instructions (Signed)
Your procedure is scheduled on:06/12/13  Enter through the Main Entrance at :1130 am Pick up desk phone and dial [phone] and inform us of your arrival.  Please call [phone] if you have any problems the morning of surgery.  Remember: Do not eat food after midnight:WED. Clear liquids are ok until:9am Thursday am   You may brush your teeth the morning of surgery.  Take these meds the morning of surgery with a sip of water: usual am meds  but no vitamins. Bring inhaler to hospital  DO NOT wear jewelry, eye make-up, lipstick,body lotion, or dark fingernail polish.  (Polished toes are ok) You may wear deodorant.  If you are to be admitted after surgery, leave suitcase in car until your room has been assigned. Patients discharged on the day of surgery will not be allowed to drive home. Wear loose fitting, comfortable clothes for your ride home.

## 2013-06-08 NOTE — H&P (Signed)
[name] is an 42 y.o. female. Patient was to see the office on 02/26/2013 before she had been referred to the GYN oncologist Dr. Kerri Rush. My last office note as follows:  "the patient is a 42 year old who was seen on June 25 for ultrasound.This patient has a long-standing history of over the course of the past 3 years of a persistent right ovarian cyst that has fluctuated in size but never greater than 2 cm. There has never been in any of the previous ultrasounds Any mention of blood flow to the cyst. Back in June of 2012 she had a normal CA 125. Patient had a laparoscopy for a hemorrhagic cyst when she was 18. Dr. Eda Rush had done a laparoscopy on her in 2008 and removed bilateral serous cyst adenofibroma's which were benign. She had been referred to Dr. Hazle Rush GYN oncologist at [facility] for second opinion and he recommended observation and no surgery.  Patient has been asymptomatic and has been on generic Yasmin although at times she does feel some right lower quadrant point and twinging sensations. She takes the oral contraceptive pill continuously and is not menstruating. Her Pap smears have been normal the last one in 2012.Review of her record indicated she had colon polyps several years ago and her last colonoscopy with this year was normal she is followed by Dr. Adriana Rush gastroenterologist at [facility]. "  Office visit with my partner Dr. Eda Rush in December of 2013 he had described the following:  "This cyst was first diagnosed on ultrasound in 2011. At that time it was solid and was 1.7 cm. It was smooth thin wall and was avascular. I told the patient that we should watch it. She saw her Dr. Soledad Rush at [facility] who agreed.Since then she's had four followup ultrasounds without significant change. Sometimes it'll measure a little larger but never greater than 2 cm. The last several ultrasounds there appears to be a cystic component. On ultrasound today her  uterus continues to show small fibroids. Her endometrial echo is 4.4 mm. Her left ovary is normal except for microcalcifications. On her right ovary there is a 1.6 cm cyst which appears to have both a cystic and solid component. It is avascular. It is thin walled. Her cul-de-sac remains free of fluid."  followup ultrasound in our office on 02/26/2013 for followup comparison to previous ultrasound of 6 month prior as follows:  Uterus measures 7.0 x 4.3 x 4.5 cm with endometrial stripe of 2.3 mm. A small subserous fibroid measuring 14 x 16 mm was noted. Right ovary continued presence of solid/cystic mass measuring 19 x 19 x 18 mm average size 19 mm with calcification measuring 6 mm. Increased slightly in size from last ultrasound this time a feeder vessel was noted going to the mass. Left ovary appeared to be normal no fluid in the cul-de-sac.  Patient had a normal C1 25 and an office visit.  Patient was referred to [facility] GYN oncologists Dr. Kerri Rush for a second opinion and according from his last office note as follows:  "Persistent ovarian cyst with nodules, calcification, and new evidence of vascularity. lengthy discussion with the patient and her mother regarding the ultrasound findings and CA 125 results. It's my opinion that this is a benign ovarian neoplasm probably similar to other neoplasms and she's had removed previously. In my opinion the ovarian cyst could be followed with serial ultrasounds every 6 months. On the other hand surgical removal the  cyst would be reasonable should the patient have worsening symptoms or remaining psychologically uncomfortable with a surveillance program. On further discussion, it is clear that the patient does not like to worry about the presence of the cyst and they have continued ultrasound examinations and therefore would prefer to have surgical removal. This seems perfectly reasonable to me."  Patient presented today to schedule her right ovary and  cystectomy possible right oophorectomy possible right salpingo-oophorectomy.she is decided that she does not want to wait any further or watching.  Patient with prior history of laparoscopic left ovarian cystectomy versus adenofibroma  Pertinent Gynecological History:  Menses: No menses  Bleeding: no menses  Contraception: OCP (estrogen/progesterone)  DES exposure: denies  Blood transfusions: none  Sexually transmitted diseases: no past history  Previous GYN Procedures: Diagnostic laparoscopy and left ovarian cystectomy  Last mammogram: Niece to schedule Date: needs to schedule  Last pap: normal Date: 2012  OB History: G0, P0  Menstrual History:  Menarche age: 84  No LMP recorded. Patient is not currently having periods (Reason: Oral contraceptives).  Past Medical History   Diagnosis  Date   .  Arrhythmia    .  Mononucleosis  5-10   .  Ovarian cyst      Serous Cystadenofibroma-Left ovary   .  Clostridium difficile infection    .  Mold contact confirmed      Toxic Mold syndrome   .  Immune deficiency disorder    .  Anxiety    .  Asthma    .  GERD (gastroesophageal reflux disease)    .  Hypertension    .  Thyroid disease     Past Surgical History   Procedure  Laterality  Date   .  Tonsillectomy   2009   .  Fecal transplant     .  Groin cyst     .  Nose cyst     .  Pelvic laparoscopy       DL Ovarian cystectomy-Left   .  Cholecystectomy      Family History   Problem  Relation  Age of Onset   .  Hypertension  Mother    .  Diabetes  Mother    .  Breast cancer  Maternal Aunt      Age 9   .  Pancreatic cancer  Maternal Grandmother    .  Heart disease  Maternal Grandfather    .  Breast cancer  Paternal Grandmother      Age 1   Social History: reports that she has never smoked. She has never used smokeless tobacco. She reports that she does not drink alcohol or use illicit drugs.  Allergies:  Allergies   Allergen  Reactions   .  Allegra [Fexofenadine Hcl]    .   Diphenhydramine Hcl    .  Doxycycline    .  Morphine      REACTION: insomnia   .  Oseltamivir Phosphate    .  Other      NUT ALLERGY   .  Penicillins    .  Sulfonamide Derivatives    .  Vancomycin    (Not in a hospital admission)  REVIEW OF SYSTEMS: A ROS was performed and pertinent positives and negatives are included in the history.  GENERAL: No fevers or chills. HEENT: No change in vision, no earache, sore throat or sinus congestion. NECK: No pain or stiffness. CARDIOVASCULAR: No chest pain or pressure. No palpitations. PULMONARY: No  shortness of breath, cough or wheeze. GASTROINTESTINAL: No abdominal pain, nausea, vomiting or diarrhea, melena or bright red blood per rectum. GENITOURINARY: No urinary frequency, urgency, hesitancy or dysuria. MUSCULOSKELETAL: No joint or muscle pain, no back pain, no recent trauma. DERMATOLOGIC: No rash, no itching, no lesions. ENDOCRINE: No polyuria, polydipsia, no heat or cold intolerance. No recent change in weight. HEMATOLOGICAL: No anemia or easy bruising or bleeding. NEUROLOGIC: No headache, seizures, numbness, tingling or weakness. PSYCHIATRIC: No depression, no loss of interest in normal activity or change in sleep pattern.  Blood pressure 128/86.  Physical Exam:  HEENT:unremarkable  Neck:Supple, midline, no thyroid megaly, no carotid bruits  Lungs: Clear to auscultation no rhonchi's or wheezes  Heart:Regular rate and rhythm, no murmurs or gallops  Breast Exam:not examine  Abdomen:soft nontender no rebound or guarding  Pelvic:BUSwithin normal limits  Vagina:no lesions or discharge  Cervix:no lesions or discharge  Uterus:anteverted normal size shape and consistency  Adnexa:no masses or tenderness  Extremities: No cords, no edema  Rectal:not examine  No results found for this or any previous visit (from the past 24 hour(s)).  No results found.  Assessment/Plan:  Persistent right ovarian cyst with nodules, calcification and new evidence of  vascularity will be scheduled to undergo a right ovarian cystectomy possible right oophorectomy possible right salpingo-oophorectomy. Patient returned to the office a few days before her surgery to compare with previous scans 3 months prior to make sure that there has not been any resolution to this ovarian cyst.  Patient was counseled as to the risk of surgery to include the following:  1. Infection (prohylactic antibiotics will be administered)  2. DVT/Pulmonary Embolism (prophylactic pneumo compression stockings will be used)  3.Trauma to internal organs requiring additional surgical procedure to repair any injury to  Internal organs requiring perhaps additional hospitalization days.  4.Hemmorhage requiring transfusion and blood products which carry risks such as anaphylactic reaction, hepatitis and AIDS  Patient had received literature information on the procedure scheduled and all her questions were answered and fully accepts all risk.

## 2013-06-09 ENCOUNTER — Ambulatory Visit (INDEPENDENT_AMBULATORY_CARE_PROVIDER_SITE_OTHER): Payer: BC Managed Care – PPO | Admitting: Gynecology

## 2013-06-09 ENCOUNTER — Ambulatory Visit (INDEPENDENT_AMBULATORY_CARE_PROVIDER_SITE_OTHER): Payer: BC Managed Care – PPO

## 2013-06-09 DIAGNOSIS — N838 Other noninflammatory disorders of ovary, fallopian tube and broad ligament: Secondary | ICD-10-CM

## 2013-06-09 DIAGNOSIS — R1031 Right lower quadrant pain: Secondary | ICD-10-CM

## 2013-06-09 DIAGNOSIS — N83201 Unspecified ovarian cyst, right side: Secondary | ICD-10-CM

## 2013-06-09 DIAGNOSIS — N949 Unspecified condition associated with female genital organs and menstrual cycle: Secondary | ICD-10-CM

## 2013-06-09 DIAGNOSIS — N8300 Follicular cyst of ovary, unspecified side: Secondary | ICD-10-CM

## 2013-06-09 DIAGNOSIS — Z01818 Encounter for other preprocedural examination: Secondary | ICD-10-CM

## 2013-06-09 DIAGNOSIS — N839 Noninflammatory disorder of ovary, fallopian tube and broad ligament, unspecified: Secondary | ICD-10-CM

## 2013-06-09 DIAGNOSIS — R102 Pelvic and perineal pain: Secondary | ICD-10-CM

## 2013-06-09 DIAGNOSIS — N83209 Unspecified ovarian cyst, unspecified side: Secondary | ICD-10-CM

## 2013-06-09 MED ORDER — MEDROXYPROGESTERONE ACETATE 10 MG PO TABS: 10.0000 mg | ORAL_TABLET | Freq: Every day | ORAL | Status: DC

## 2013-06-09 NOTE — Patient Instructions (Addendum)
Ovarian Cyst The ovaries are small organs that are on each side of the uterus. The ovaries are the organs that produce the female hormones, estrogen and progesterone. An ovarian cyst is a sac filled with fluid that can vary in its size. It is normal for a small cyst to form in women who are in the childbearing age and who have menstrual periods. This type of cyst is called a follicle cyst that becomes an ovulation cyst (corpus luteum cyst) after it produces the women's egg. It later goes away on its own if the woman does not become pregnant. There are other kinds of ovarian cysts that may cause problems and may need to be treated. The most serious problem is a cyst with cancer. It should be noted that menopausal women who have an ovarian cyst are at a higher risk of it being a cancer cyst. They should be evaluated very quickly, thoroughly and followed closely. This is especially true in menopausal women because of the high rate of ovarian cancer in women in menopause. CAUSES AND TYPES OF OVARIAN CYSTS:  FUNCTIONAL CYST: The follicle/corpus luteum cyst is a functional cyst that occurs every month during ovulation with the menstrual cycle. They go away with the next menstrual cycle if the woman does not get pregnant. Usually, there are no symptoms with a functional cyst.  ENDOMETRIOMA CYST: This cyst develops from the lining of the uterus tissue. This cyst gets in or on the ovary. It grows every month from the bleeding during the menstrual period. It is also called a "chocolate cyst" because it becomes filled with blood that turns brown. This cyst can cause pain in the lower abdomen during intercourse and with your menstrual period.  CYSTADENOMA CYST: This cyst develops from the cells on the outside of the ovary. They usually are not cancerous. They can get very big and cause lower abdomen pain and pain with intercourse. This type of cyst can twist on itself, cut off its blood supply and cause severe pain. It  also can easily rupture and cause a lot of pain.  DERMOID CYST: This type of cyst is sometimes found in both ovaries. They are found to have different kinds of body tissue in the cyst. The tissue includes skin, teeth, hair, and/or cartilage. They usually do not have symptoms unless they get very big. Dermoid cysts are rarely cancerous.  POLYCYSTIC OVARY: This is a rare condition with hormone problems that produces many small cysts on both ovaries. The cysts are follicle-like cysts that never produce an egg and become a corpus luteum. It can cause an increase in body weight, infertility, acne, increase in body and facial hair and lack of menstrual periods or rare menstrual periods. Many women with this problem develop type 2 diabetes. The exact cause of this problem is unknown. A polycystic ovary is rarely cancerous.  THECA LUTEIN CYST: Occurs when too much hormone (human chorionic gonadotropin) is produced and over-stimulates the ovaries to produce an egg. They are frequently seen when doctors stimulate the ovaries for invitro-fertilization (test tube babies).  LUTEOMA CYST: This cyst is seen during pregnancy. Rarely it can cause an obstruction to the birth canal during labor and delivery. They usually go away after delivery. SYMPTOMS   Pelvic pain or pressure.  Pain during sexual intercourse.  Increasing girth (swelling) of the abdomen.  Abnormal menstrual periods.  Increasing pain with menstrual periods.  You stop having menstrual periods and you are not pregnant. DIAGNOSIS  The diagnosis can   be made during:  Routine or annual pelvic examination (common).  Ultrasound.  X-ray of the pelvis.  CT Scan.  MRI.  Blood tests. TREATMENT   Treatment may only be to follow the cyst monthly for 2 to 3 months with your caregiver. Many go away on their own, especially functional cysts.  May be aspirated (drained) with a long needle with ultrasound, or by laparoscopy (inserting a tube into  the pelvis through a small incision).  The whole cyst can be removed by laparoscopy.  Sometimes the cyst may need to be removed through an incision in the lower abdomen.  Hormone treatment is sometimes used to help dissolve certain cysts.  Birth control pills are sometimes used to help dissolve certain cysts. HOME CARE INSTRUCTIONS  Follow your caregiver's advice regarding:  Medicine.  Follow up visits to evaluate and treat the cyst.  You may need to come back or make an appointment with another caregiver, to find the exact cause of your cyst, if your caregiver is not a gynecologist.  Get your yearly and recommended pelvic examinations and Pap tests.  Let your caregiver know if you have had an ovarian cyst in the past. SEEK MEDICAL CARE IF:   Your periods are late, irregular, they stop, or are painful.  Your stomach (abdomen) or pelvic pain does not go away.  Your stomach becomes larger or swollen.  You have pressure on your bladder or trouble emptying your bladder completely.  You have painful sexual intercourse.  You have feelings of fullness, pressure, or discomfort in your stomach.  You lose weight for no apparent reason.  You feel generally ill.  You become constipated.  You lose your appetite.  You develop acne.  You have an increase in body and facial hair.  You are gaining weight, without changing your exercise and eating habits.  You think you are pregnant. SEEK IMMEDIATE MEDICAL CARE IF:   You have increasing abdominal pain.  You feel sick to your stomach (nausea) and/or vomit.  You develop a fever that comes on suddenly.  You develop abdominal pain during a bowel movement.  Your menstrual periods become heavier than usual. Document Released: 08/21/2005 Document Revised: 11/13/2011 Document Reviewed: 06/24/2009 [facility] Patient Information 2014 [place], [place]. Diagnostic Laparoscopy Laparoscopy is a surgical procedure. It is used to diagnose  and treat diseases inside the belly(abdomen). It is usually a brief, common, and relatively simple procedure. The laparoscopeis a thin, lighted, pencil-sized instrument. It is like a telescope. It is inserted into your abdomen through a small cut (incision). Your caregiver can look at the organs inside your body through this instrument. He or she can see if there is anything abnormal. Laparoscopy can be done either in a hospital or outpatient clinic. You may be given a mild sedative to help you relax before the procedure. Once in the operating room, you will be given a drug to make you sleep (general anesthesia). Laparoscopy usually lasts less than 1 hour. After the procedure, you will be monitored in a recovery area until you are stable and doing well. Once you are home, it will take 2 to 3 days to fully recover. RISKS AND COMPLICATIONS  Laparoscopy has relatively few risks. Your caregiver will discuss the risks with you before the procedure. Some problems that can occur include:  Infection.  Bleeding.  Damage to other organs.  Anesthetic side effects. PROCEDURE Once you receive anesthesia, your surgeon inflates the abdomen with a harmless gas (carbon dioxide). This makes the organs  easier to see. The laparoscope is inserted into the abdomen through a small incision. This allows your surgeon to see into the abdomen. Other small instruments are also inserted into the abdomen through other small openings. Many surgeons attach a video camera to the laparoscope to enlarge the view. During a diagnostic laparoscopy, the surgeon may be looking for inflammation, infection, or cancer. Your surgeon may take tissue samples(biopsies). The samples are sent to a specialist in looking at cells and tissue samples (pathologist). The pathologist examines them under a microscope. Biopsies can help to diagnose or confirm a disease. AFTER THE PROCEDURE   The gas is released from inside the abdomen.  The incisions  are closed with stitches (sutures). Because these incisions are small (usually less than 1/2 inch), there is usually minimal discomfort after the procedure. There may be some mild discomfort in the throat. This is from the tube placed in the throat while you were sleeping. You may have some mild abdominal discomfort. There may also be discomfort from the instrument placement incisions in the abdomen.  The recovery time is shortened as long as there are no complications.  You will rest in a recovery room until stable and doing well. As long as there are no complications, you may be allowed to go home. FINDING OUT THE RESULTS OF YOUR TEST Not all test results are available during your visit. If your test results are not back during the visit, make an appointment with your caregiver to find out the results. Do not assume everything is normal if you have not heard from your caregiver or the medical facility. It is important for you to follow up on all of your test results. HOME CARE INSTRUCTIONS   Take all medicines as directed.  Only take over-the-counter or prescription medicines for pain, discomfort, or fever as directed by your caregiver.  Resume daily activities as directed.  Showers are preferred over baths.  You may resume sexual activities in 1 week or as directed.  Do not drive while taking narcotics. SEEK MEDICAL CARE IF:   There is increasing abdominal pain.  There is new pain in the shoulders (shoulder strap areas).  You feel lightheaded or faint.  You have the chills.  You or your child has an oral temperature above 102 F (38.9 C).  There is pus-like (purulent) drainage from any of the wounds.  You are unable to pass gas or have a bowel movement.  You feel sick to your stomach (nauseous) or throw up (vomit). MAKE SURE YOU:   Understand these instructions.  Will watch your condition.  Will get help right away if you are not doing well or get worse. Document  Released: 11/27/2000 Document Revised: 11/13/2011 Document Reviewed: 08/21/2007 [facility] Patient Information 2014 [place], [place].

## 2013-06-09 NOTE — Progress Notes (Signed)
The [name] presented to the office today for final preoperative consultation and ultrasound before her planned laparoscopic right oophorectomy possible right salpingo-oophorectomy for persistent right ovarian cyst with calcification and feeder vessel. Her history is as follows:  [name] was to see the office on 02/26/2013 before she had been referred to the GYN oncologist Dr. Kerri Perches. My last office note as follows:   "the [name] is a 42 year old who was seen on June 25 for ultrasound.This [name] has a long-standing history of over the course of the past 3 years of a persistent right ovarian cyst that has fluctuated in size but never greater than 2 cm. There has never been in any of the previous ultrasounds Any mention of blood flow to the cyst. Back in June of 2012 she had a normal CA 125. [name] had a laparoscopy for a hemorrhagic cyst when she was 18. Dr. Eda Paschal had done a laparoscopy on her in 2008 and removed bilateral serous cyst adenofibroma's which were benign. She had been referred to Dr. Hazle Coca GYN oncologist at [facility] for second opinion and he recommended observation and no surgery.   [name] has been asymptomatic and has been on generic Yasmin although at times she does feel some right lower quadrant point and twinging sensations. She takes the oral contraceptive pill continuously and is not menstruating. She has stopped her oral contraceptive pill now for over a month.Her Pap smears have been normal the last one in 2012.Review of her record indicated she had colon polyps several years ago and her last colonoscopy with this year was normal she is followed by Dr. Adriana Simas gastroenterologist at [facility]. "  Office visit with my partner Dr. Eda Paschal in December of 2013 he had described the following:   "This cyst was first diagnosed on ultrasound in 2011. At that time it was solid and was 1.7 cm. It was smooth thin wall and was avascular. I told the [name]  that we should watch it. She saw her Dr. Soledad Gerlach at [facility] who agreed.Since then she's had four followup ultrasounds without significant change. Sometimes it'll measure a little larger but never greater than 2 cm. The last several ultrasounds there appears to be a cystic component. On ultrasound today her uterus continues to show small fibroids. Her endometrial echo is 4.4 mm. Her left ovary is normal except for microcalcifications. On her right ovary there is a 1.6 cm cyst which appears to have both a cystic and solid component. It is avascular. It is thin walled. Her cul-de-sac remains free of fluid."   followup ultrasound in our office on 02/26/2013 for followup comparison to previous ultrasound of 6 month prior as follows:   Uterus measures 7.0 x 4.3 x 4.5 cm with endometrial stripe of 2.3 mm. A small subserous fibroid measuring 14 x 16 mm was noted. Right ovary continued presence of solid/cystic mass measuring 19 x 19 x 18 mm average size 19 mm with calcification measuring 6 mm. Increased slightly in size from last ultrasound this time a feeder vessel was noted going to the mass. Left ovary appeared to be normal no fluid in the cul-de-sac.  [name] had a normal C1 25 and an office visit.  [name] was referred to [facility] GYN oncologists Dr. Kerri Perches for a second opinion and according from his last office note as follows:   "Persistent ovarian cyst with nodules, calcification, and new evidence of vascularity. lengthy discussion with the [name] and her mother regarding the ultrasound  findings and CA 125 results. It's my opinion that this is a benign ovarian neoplasm probably similar to other neoplasms and she's had removed previously. In my opinion the ovarian cyst could be followed with serial ultrasounds every 6 months. On the other hand surgical removal the cyst would be reasonable should the [name] have worsening symptoms or remaining psychologically uncomfortable with a surveillance  program. On further discussion, it is clear that the [name] does not like to worry about the presence of the cyst and they have continued ultrasound examinations and therefore would prefer to have surgical removal. This seems perfectly reasonable to me."   The [name]'s ultrasound today as follows: Uterus measures 8.0 x 4.5 x 4.2 cm with endometrial stripe 11 for 1 mm. Right ovary with solid focus with wall calcification measuring 19 x 17 x 15 mm every size 17 mm. Arterial feeder vessel with blood flow seen in the mass once again. No significant change inside from previous scan. Left ovary with 2 echo-free follicles less than 20 mm. Minimal fluid seen the cul-de-sac.   [name] with prior history of laparoscopic bilateral cystectomy .  Pertinent Gynecological History:  Menses: No menses  Bleeding: no menses  Contraception: OCP (estrogen/progesterone)  DES exposure: denies  Blood transfusions: none  Sexually transmitted diseases: no past history  Previous GYN Procedures: Diagnostic laparoscopy and left ovarian cystectomy  Last mammogram: Niece to schedule Date: needs to schedule  Last pap: normal Date: 2012  OB History: G0, P0  Menstrual History:  Menarche age: 55  No LMP recorded. [name] is not currently having periods (Reason: Oral contraceptives).  Past Medical History   Diagnosis  Date   .  Arrhythmia    .  Mononucleosis  5-10   .  Ovarian cyst      Serous Cystadenofibroma-Left ovary   .  Clostridium difficile infection    .  Mold contact confirmed      Toxic Mold syndrome   .  Immune deficiency disorder    .  Anxiety    .  Asthma    .  GERD (gastroesophageal reflux disease)    .  Hypertension    .  Thyroid disease     Past Surgical History   Procedure  Laterality  Date   .  Tonsillectomy   2009   .  Fecal transplant     .  Groin cyst     .  Nose cyst     .  Pelvic laparoscopy       DL Ovarian cystectomy-Left   .  Cholecystectomy      Family History   Problem   Relation  Age of Onset   .  Hypertension  Mother    .  Diabetes  Mother    .  Breast cancer  Maternal Aunt      Age 27   .  Pancreatic cancer  Maternal Grandmother    .  Heart disease  Maternal Grandfather    .  Breast cancer  Paternal Grandmother      Age 60   Social History: reports that she has never smoked. She has never used smokeless tobacco. She reports that she does not drink alcohol or use illicit drugs.  Allergies:  Allergies   Allergen  Reactions   .  Allegra [Fexofenadine Hcl]    .  Diphenhydramine Hcl    .  Doxycycline    .  Morphine      REACTION: insomnia   .  Oseltamivir Phosphate    .  Other      NUT ALLERGY   .  Penicillins    .  Sulfonamide Derivatives    .  Vancomycin    (Not in a hospital admission)  REVIEW OF SYSTEMS: A ROS was performed and pertinent positives and negatives are included in the history.  GENERAL: No fevers or chills. HEENT: No change in vision, no earache, sore throat or sinus congestion. NECK: No pain or stiffness. CARDIOVASCULAR: No chest pain or pressure. No palpitations. PULMONARY: No shortness of breath, cough or wheeze. GASTROINTESTINAL: No abdominal pain, nausea, vomiting or diarrhea, melena or bright red blood per rectum. GENITOURINARY: No urinary frequency, urgency, hesitancy or dysuria. MUSCULOSKELETAL: No joint or muscle pain, no back pain, no recent trauma. DERMATOLOGIC: No rash, no itching, no lesions. ENDOCRINE: No polyuria, polydipsia, no heat or cold intolerance. No recent change in weight. HEMATOLOGICAL: No anemia or easy bruising or bleeding. NEUROLOGIC: No headache, seizures, numbness, tingling or weakness. PSYCHIATRIC: No depression, no loss of interest in normal activity or change in sleep pattern.  Blood pressure 128/86.  Physical Exam:  HEENT:unremarkable  Neck:Supple, midline, no thyroid megaly, no carotid bruits  Lungs: Clear to auscultation no rhonchi's or wheezes  Heart:Regular rate and rhythm, no murmurs or gallops   Breast Exam:not examine  Abdomen:soft nontender no rebound or guarding  Pelvic:BUSwithin normal limits  Vagina:no lesions or discharge  Cervix:no lesions or discharge  Uterus:anteverted normal size shape and consistency  Adnexa:no masses or tenderness  Extremities: No cords, no edema  Rectal:not examine  No results found for this or any previous visit (from the past 24 hour(s)).  No results found.  Assessment/Plan:  Persistent right ovarian cyst with nodules, calcification and new evidence of vascularity will be scheduled to undergo a right ovarian cystectomy possible right oophorectomy possible right salpingo-oophorectomy. [name] returned to the office a few days before her surgery to compare with previous scans 3 months prior to make sure that there has not been any resolution to this ovarian cyst.  [name] was counseled as to the risk of surgery to include the following:  1. Infection (prohylactic antibiotics will be administered)  2. DVT/Pulmonary Embolism (prophylactic pneumo compression stockings will be used)  3.Trauma to internal organs requiring additional surgical procedure to repair any injury to  Internal organs requiring perhaps additional hospitalization days.  4.Hemmorhage requiring transfusion and blood products which carry risks such as anaphylactic reaction, hepatitis and AIDS  [name] had received literature information on the procedure scheduled and all her questions were answered and fully accepts all risk.

## 2013-06-11 MED ORDER — GENTAMICIN SULFATE 40 MG/ML IJ SOLN
INTRAVENOUS | Status: AC
  Administered 2013-06-12: 100 mL via INTRAVENOUS
  Filled 2013-06-11: qty 8.25

## 2013-06-12 ENCOUNTER — Ambulatory Visit (HOSPITAL_COMMUNITY)
Admission: RE | Admit: 2013-06-12 | Discharge: 2013-06-12 | Disposition: A | Discharge status: Home / Self Care | Payer: BC Managed Care – PPO | Source: Ambulatory Visit | Attending: Gynecology | Admitting: Gynecology

## 2013-06-12 ENCOUNTER — Ambulatory Visit (HOSPITAL_COMMUNITY): Payer: BC Managed Care – PPO | Admitting: Anesthesiology

## 2013-06-12 ENCOUNTER — Encounter (HOSPITAL_COMMUNITY): Payer: Self-pay | Admitting: Anesthesiology

## 2013-06-12 ENCOUNTER — Encounter (HOSPITAL_COMMUNITY): Payer: BC Managed Care – PPO | Admitting: Anesthesiology

## 2013-06-12 ENCOUNTER — Encounter (HOSPITAL_COMMUNITY)
Admission: RE | Disposition: A | Discharge status: Home / Self Care | Payer: Self-pay | Source: Ambulatory Visit | Attending: Gynecology

## 2013-06-12 DIAGNOSIS — N803 Endometriosis of pelvic peritoneum, unspecified: Secondary | ICD-10-CM | POA: Insufficient documentation

## 2013-06-12 DIAGNOSIS — N83209 Unspecified ovarian cyst, unspecified side: Secondary | ICD-10-CM | POA: Insufficient documentation

## 2013-06-12 DIAGNOSIS — N949 Unspecified condition associated with female genital organs and menstrual cycle: Secondary | ICD-10-CM

## 2013-06-12 DIAGNOSIS — N8000 Endometriosis of the uterus, unspecified: Secondary | ICD-10-CM

## 2013-06-12 DIAGNOSIS — Z9889 Other specified postprocedural states: Secondary | ICD-10-CM

## 2013-06-12 DIAGNOSIS — N854 Malposition of uterus: Secondary | ICD-10-CM | POA: Insufficient documentation

## 2013-06-12 HISTORY — PX: LAPAROSCOPY: SHX197

## 2013-06-12 HISTORY — PX: OOPHORECTOMY: SHX6387

## 2013-06-12 LAB — PREGNANCY, URINE: Preg Test, Ur: NEGATIVE

## 2013-06-12 SURGERY — LAPAROSCOPY OPERATIVE
Anesthesia: General | Site: Abdomen | Laterality: Right | Wound class: Clean Contaminated

## 2013-06-12 MED ORDER — LIDOCAINE HCL (CARDIAC) 20 MG/ML IV SOLN
INTRAVENOUS | Status: AC
  Filled 2013-06-12: qty 5

## 2013-06-12 MED ORDER — HEPARIN SODIUM (PORCINE) 5000 UNIT/ML IJ SOLN
INTRAMUSCULAR | Status: AC
  Filled 2013-06-12: qty 1

## 2013-06-12 MED ORDER — LIDOCAINE HCL (CARDIAC) 20 MG/ML IV SOLN
INTRAVENOUS | Status: DC | PRN
  Administered 2013-06-12: 30 mg via INTRAVENOUS

## 2013-06-12 MED ORDER — KETOROLAC TROMETHAMINE 30 MG/ML IJ SOLN
INTRAMUSCULAR | Status: AC
  Filled 2013-06-12: qty 1

## 2013-06-12 MED ORDER — METOCLOPRAMIDE HCL 10 MG PO TABS: 10.0000 mg | ORAL_TABLET | Freq: Three times a day (TID) | ORAL | Status: DC

## 2013-06-12 MED ORDER — NEOSTIGMINE METHYLSULFATE 1 MG/ML IJ SOLN
INTRAMUSCULAR | Status: AC
  Filled 2013-06-12: qty 1

## 2013-06-12 MED ORDER — PROPOFOL 10 MG/ML IV EMUL
INTRAVENOUS | Status: AC
  Filled 2013-06-12: qty 20

## 2013-06-12 MED ORDER — GLYCOPYRROLATE 0.2 MG/ML IJ SOLN
INTRAMUSCULAR | Status: DC | PRN
  Administered 2013-06-12: 0.6 mg via INTRAVENOUS

## 2013-06-12 MED ORDER — FENTANYL CITRATE 0.05 MG/ML IJ SOLN
INTRAMUSCULAR | Status: AC
  Filled 2013-06-12: qty 5

## 2013-06-12 MED ORDER — HYDROXYZINE HCL 50 MG/ML IM SOLN
25.0000 mg | INTRAMUSCULAR | Status: DC | PRN
  Filled 2013-06-12: qty 0.5

## 2013-06-12 MED ORDER — ROCURONIUM BROMIDE 100 MG/10ML IV SOLN
INTRAVENOUS | Status: DC | PRN
  Administered 2013-06-12: 2.5 mg via INTRAVENOUS
  Administered 2013-06-12: 37.5 mg via INTRAVENOUS

## 2013-06-12 MED ORDER — BUPIVACAINE HCL (PF) 0.25 % IJ SOLN
INTRAMUSCULAR | Status: AC
  Filled 2013-06-12: qty 30

## 2013-06-12 MED ORDER — ONDANSETRON HCL 4 MG/2ML IJ SOLN
INTRAMUSCULAR | Status: AC
  Filled 2013-06-12: qty 2

## 2013-06-12 MED ORDER — BUPIVACAINE HCL (PF) 0.25 % IJ SOLN
INTRAMUSCULAR | Status: DC | PRN
  Administered 2013-06-12: 10 mL

## 2013-06-12 MED ORDER — MEPERIDINE HCL 25 MG/ML IJ SOLN: 6.2500 mg | INTRAMUSCULAR | Status: DC | PRN

## 2013-06-12 MED ORDER — GLYCOPYRROLATE 0.2 MG/ML IJ SOLN
INTRAMUSCULAR | Status: AC
  Filled 2013-06-12: qty 2

## 2013-06-12 MED ORDER — MIDAZOLAM HCL 2 MG/2ML IJ SOLN
INTRAMUSCULAR | Status: DC | PRN
  Administered 2013-06-12: 2 mg via INTRAVENOUS

## 2013-06-12 MED ORDER — KETOROLAC TROMETHAMINE 30 MG/ML IJ SOLN
INTRAMUSCULAR | Status: DC | PRN
  Administered 2013-06-12: 30 mg via INTRAVENOUS

## 2013-06-12 MED ORDER — HEPARIN SODIUM (PORCINE) 5000 UNIT/ML IJ SOLN
INTRAMUSCULAR | Status: DC | PRN
  Administered 2013-06-12: 5000 [IU]

## 2013-06-12 MED ORDER — DEXAMETHASONE SODIUM PHOSPHATE 10 MG/ML IJ SOLN
INTRAMUSCULAR | Status: AC
  Filled 2013-06-12: qty 1

## 2013-06-12 MED ORDER — LACTATED RINGERS IR SOLN
Status: DC | PRN
  Administered 2013-06-12: 3000 mL

## 2013-06-12 MED ORDER — LACTATED RINGERS IV SOLN
INTRAVENOUS | Status: DC
  Administered 2013-06-12 (×2): via INTRAVENOUS

## 2013-06-12 MED ORDER — FENTANYL CITRATE 0.05 MG/ML IJ SOLN
INTRAMUSCULAR | Status: DC | PRN
  Administered 2013-06-12: 50 ug via INTRAVENOUS
  Administered 2013-06-12 (×2): 100 ug via INTRAVENOUS

## 2013-06-12 MED ORDER — DEXAMETHASONE SODIUM PHOSPHATE 10 MG/ML IJ SOLN
INTRAMUSCULAR | Status: DC | PRN
  Administered 2013-06-12: 10 mg via INTRAVENOUS

## 2013-06-12 MED ORDER — FENTANYL CITRATE 0.05 MG/ML IJ SOLN
INTRAMUSCULAR | Status: AC
  Administered 2013-06-12: 50 ug via INTRAVENOUS
  Filled 2013-06-12: qty 2

## 2013-06-12 MED ORDER — PROMETHAZINE HCL 25 MG/ML IJ SOLN
6.2500 mg | INTRAMUSCULAR | Status: DC | PRN
  Administered 2013-06-12: 6.25 mg via INTRAVENOUS

## 2013-06-12 MED ORDER — ONDANSETRON HCL 4 MG/2ML IJ SOLN
INTRAMUSCULAR | Status: DC | PRN
  Administered 2013-06-12: 4 mg via INTRAMUSCULAR
  Administered 2013-06-12: 2 mg via INTRAMUSCULAR

## 2013-06-12 MED ORDER — PROPOFOL 10 MG/ML IV BOLUS
INTRAVENOUS | Status: DC | PRN
  Administered 2013-06-12: 180 mg via INTRAVENOUS

## 2013-06-12 MED ORDER — PROMETHAZINE HCL 25 MG/ML IJ SOLN
INTRAMUSCULAR | Status: AC
  Administered 2013-06-12: 6.25 mg via INTRAVENOUS
  Filled 2013-06-12: qty 1

## 2013-06-12 MED ORDER — HYDROXYZINE HCL 25 MG PO TABS
25.0000 mg | ORAL_TABLET | Freq: Once | ORAL | Status: AC
  Administered 2013-06-12: 25 mg via ORAL
  Filled 2013-06-12: qty 1

## 2013-06-12 MED ORDER — ROCURONIUM BROMIDE 50 MG/5ML IV SOLN
INTRAVENOUS | Status: AC
  Filled 2013-06-12: qty 1

## 2013-06-12 MED ORDER — NEOSTIGMINE METHYLSULFATE 1 MG/ML IJ SOLN
INTRAMUSCULAR | Status: DC | PRN
  Administered 2013-06-12: 3 mg via INTRAVENOUS

## 2013-06-12 MED ORDER — MIDAZOLAM HCL 2 MG/2ML IJ SOLN
INTRAMUSCULAR | Status: AC
  Filled 2013-06-12: qty 2

## 2013-06-12 MED ORDER — FENTANYL CITRATE 0.05 MG/ML IJ SOLN
25.0000 ug | INTRAMUSCULAR | Status: DC | PRN
  Administered 2013-06-12: 50 ug via INTRAVENOUS
  Administered 2013-06-12: 25 ug via INTRAVENOUS

## 2013-06-12 SURGICAL SUPPLY — 31 items
ADH SKN CLS APL DERMABOND .7 (GAUZE/BANDAGES/DRESSINGS) ×2
BAG SPEC RTRVL LRG 6X4 10 (ENDOMECHANICALS) ×2
BARRIER ADHS 3X4 INTERCEED (GAUZE/BANDAGES/DRESSINGS) IMPLANT
BRR ADH 4X3 ABS CNTRL BYND (GAUZE/BANDAGES/DRESSINGS)
CABLE HIGH FREQUENCY MONO STRZ (ELECTRODE) IMPLANT
CLOTH BEACON ORANGE TIMEOUT ST (SAFETY) ×3 IMPLANT
COVER MAYO STAND STRL (DRAPES) ×3 IMPLANT
DERMABOND ADVANCED (GAUZE/BANDAGES/DRESSINGS) ×1
DERMABOND ADVANCED .7 DNX12 (GAUZE/BANDAGES/DRESSINGS) IMPLANT
FILTER SMOKE EVAC LAPAROSHD (FILTER) ×3 IMPLANT
GLOVE BIOGEL PI IND STRL 8 (GLOVE) ×2 IMPLANT
GLOVE BIOGEL PI INDICATOR 8 (GLOVE) ×1
GLOVE ECLIPSE 7.5 STRL STRAW (GLOVE) ×6 IMPLANT
GOWN PREVENTION PLUS LG XLONG (DISPOSABLE) ×3 IMPLANT
GOWN STRL REIN XL XLG (GOWN DISPOSABLE) ×9 IMPLANT
NS IRRIG 1000ML POUR BTL (IV SOLUTION) ×3 IMPLANT
PACK LAPAROSCOPY BASIN (CUSTOM PROCEDURE TRAY) ×3 IMPLANT
POUCH SPECIMEN RETRIEVAL 10MM (ENDOMECHANICALS) ×1 IMPLANT
PROTECTOR NERVE ULNAR (MISCELLANEOUS) ×3 IMPLANT
SCALPEL HARMONIC ACE (MISCELLANEOUS) ×1 IMPLANT
SET IRRIG TUBING LAPAROSCOPIC (IRRIGATION / IRRIGATOR) ×3 IMPLANT
SOLUTION ELECTROLUBE (MISCELLANEOUS) ×2 IMPLANT
SUT VIC AB 3-0 PS2 18 (SUTURE) ×3
SUT VIC AB 3-0 PS2 18XBRD (SUTURE) ×2 IMPLANT
SUT VICRYL 0 UR6 27IN ABS (SUTURE) ×3 IMPLANT
TOWEL OR 17X24 6PK STRL BLUE (TOWEL DISPOSABLE) ×6 IMPLANT
TRAY FOLEY CATH 14FR (SET/KITS/TRAYS/PACK) ×3 IMPLANT
TROCAR XCEL NON-BLD 11X100MML (ENDOMECHANICALS) ×3 IMPLANT
TROCAR XCEL NON-BLD 5MMX100MML (ENDOMECHANICALS) ×3 IMPLANT
TROCAR XCEL OPT SLVE 5M 100M (ENDOMECHANICALS) ×3 IMPLANT
WARMER LAPAROSCOPE (MISCELLANEOUS) ×3 IMPLANT

## 2013-06-12 NOTE — Anesthesia Postprocedure Evaluation (Signed)
  Anesthesia Post-op Note  Patient: [name]  Procedure(s) Performed: Procedure(s): LAPAROSCOPY OPERATIVE  fulgeration of endometriosis, right salpingooophorectomy (Right) OOPHORECTOMY;POSS RIGHT SALPING OOPHORECTOMY (Right) Patient is awake and responsive. Pain and nausea are reasonably well controlled. Vital signs are stable and clinically acceptable. Oxygen saturation is clinically acceptable. There are no apparent anesthetic complications at this time. Patient is ready for discharge.

## 2013-06-12 NOTE — Interval H&P Note (Signed)
History and Physical Interval Note:  06/12/2013 12:41 PM  [name]  has presented today for surgery, with the diagnosis of RIGHT OVARIAN CYST   The various methods of treatment have been discussed with the patient and family. After consideration of risks, benefits and other options for treatment, the patient has consented to  Procedure(s): LAPAROSCOPY OPERATIVE RIGHT OVARIAN CYSTECTOMY (Right) OOPHORECTOMY;POSS RIGHT SALPING OOPHORECTOMY (Right) as a surgical intervention .  The patient's history has been reviewed, patient examined, no change in status, stable for surgery.  I have reviewed the patient's chart and labs.  Questions were answered to the patient's satisfaction.     Ok Edwards

## 2013-06-12 NOTE — Anesthesia Preprocedure Evaluation (Signed)
Anesthesia Evaluation  Patient identified by MRN, date of birth, ID band Patient awake    Reviewed: Allergy & Precautions, H&P , NPO status , Patient's Chart, lab work & pertinent test results  History of Anesthesia Complications (+) PONV and history of anesthetic complications  Airway Mallampati: III TM Distance: >3 FB Neck ROM: Full    Dental no notable dental hx. (+) Teeth Intact   Pulmonary shortness of breath, asthma ,  breath sounds clear to auscultation  Pulmonary exam normal       Cardiovascular hypertension, Pt. on medications and Pt. on home beta blockers + dysrhythmias Rhythm:Regular Rate:Normal     Neuro/Psych  Headaches, PSYCHIATRIC DISORDERS Anxiety Chronic Fatigue syndrom  Neuromuscular disease    GI/Hepatic GERD-  Medicated and Controlled,IBS   Endo/Other  Hypothyroidism Morbid obesity  Renal/GU   negative genitourinary   Musculoskeletal  (+) Fibromyalgia -  Abdominal (+) + obese,   Peds  Hematology Lyme's Disease Infectious Mononucleosis   Anesthesia Other Findings   Reproductive/Obstetrics Right Ovarian Cyst                            Anesthesia Physical Anesthesia Plan  ASA: III  Anesthesia Plan: General   Post-op Pain Management:    Induction: Intravenous  Airway Management Planned: Oral ETT  Additional Equipment:   Intra-op Plan:   Post-operative Plan: Extubation in OR  Informed Consent: I have reviewed the patients History and Physical, chart, labs and discussed the procedure including the risks, benefits and alternatives for the proposed anesthesia with the patient or authorized representative who has indicated his/her understanding and acceptance.   Dental advisory given  Plan Discussed with: Anesthesiologist, CRNA and Surgeon  Anesthesia Plan Comments:         Anesthesia Quick Evaluation

## 2013-06-12 NOTE — Transfer of Care (Signed)
Immediate Anesthesia Transfer of Care Note  Patient: [name]  Procedure(s) Performed: Procedure(s): LAPAROSCOPY OPERATIVE  fulgeration of endometriosis, right salpingooophorectomy (Right) OOPHORECTOMY;POSS RIGHT SALPING OOPHORECTOMY (Right)  Patient Location: PACU  Anesthesia Type:General  Level of Consciousness: awake and sedated  Airway & Oxygen Therapy: Patient connected to nasal cannula oxygen  Post-op Assessment: Report given to PACU RN and Post -op Vital signs reviewed and stable  Post vital signs: Reviewed and stable  Complications: No apparent anesthesia complications

## 2013-06-12 NOTE — Op Note (Signed)
06/12/2013  3:05 PM  PATIENT:  [name]  42 y.o. female  PRE-OPERATIVE DIAGNOSIS:  RIGHT OVARIAN CYST   POST-OPERATIVE DIAGNOSIS: Right ovarian mass, endometriosis  PROCEDURE:  Procedure(s): LAPAROSCOPY OPERATIVE  fulgeration of endometriosis, right salpingooophorectomy OOPHORECTOMY;POSS RIGHT SALPING OOPHORECTOMY Pelvic washings  SURGEON:  Surgeon(s): Ok Edwards, MD Dara Lords, MD  ANESTHESIA:   general  FINDINGS:right ovarywith nodular excrescent was slight inflammation of the right fallopian tube. Normal left fallopian tube site of recent ovulation left ovary. Endometriotic implants scattered 2 different areas of the cul-de-sac anterior and posterior as well as pelvic sidewalls. Smooth liver surface. Appendix not visualized.  DESCRIPTION OF OPERATION:the patient was taken to the operating room where she underwent successful general endotracheal anesthesia. A timeout was undertaken to identify the patient in the procedure to be undertaken. The properly marked right side had been performed prior to bringing the patient to the operating room. Patient received 2 g of Cefotan for prophylaxis. Patient had PAS stockings for DVT prophylaxis. The abdomen vagina and perineum were prepped and draped in usual sterile fashion. Exam under anesthesia demonstrated an anteverted uterus. A Hulka tenaculum was placed for manipulation during laparoscopic procedure. A Foley catheter was inserted to monitor urinary output. After the drapes were in place a small semilunar incision was made in the infraumbilical region. A 10/11 mm Optiview trocar was inserted into the peritoneal cavity. A pneumoperitoneum was established with approximately 3 L of carbon dioxide. 2 additional 5 mm ports were made on the patient's right and left lower abdomen under laparoscopic guidance. Patient was then placed in Trendelenburg position. Systematic inspection of the pelvic cavity demonstrated the above findings.  The right tube and ovary was placed under tension but prior to doing this pelvic washings were obtained for cytological evaluation. The right ureter was identified. The right tube and ovary were placed under tension and the right infundibulopelvic pelvic ligament was coaptated and transected near the ovary. The right utero-ovarian ligament and proximal fallopian tube were coaptated and transected with the harmonic scalpel as well. The remaining mesosalpinx was coaptated and transected freeing the entire right tube and ovary. An Endopouch was utilized to put the specimen and then retrieved through the umbilicus and submitted for histological evaluation. The scattered endometriotic implants were individually cauterized. Sponge count needle count were correct. Instruments were removed as was a pneumoperitoneum. Patient was leveled off from the Trendelenburg position. The subumbilical fascia was closed with a figure-of-eight of 0 Vicryl suture and the subcutaneous tissue was reapproximated with 3-0 Vicryl suture. The skin was reapproximated with Dermabond glue as was the right lower port site. The left lower port site because of some mild bleeding 2 interrupted sutures of 3-0 Vicryl suture were utilized. 0.25% Marcaine was infiltrated in all 3 port sites were a total of 9 cc. The Hulka tenaculum was then removed. The Foley was removed. The patient was transferred to recovery room stable vital signs. Blood loss was minimal. Patient received Toradol 30 mg IV in route to the recovery room.  ESTIMATED BLOOD LOSS: * No blood loss amount entered *   Intake/Output Summary (Last 24 hours) at 06/12/13 1505 Last data filed at 06/12/13 1438  Gross per 24 hour  Intake   1700 ml  Output     50 ml  Net   1650 ml     BLOOD ADMINISTERED:none   LOCAL MEDICATIONS USED:  MARCAINE   0.25% subcutaneous incision ports total 9 cc  SPECIMEN:  Source of Specimen:  Pelvic washings, right tube and ovary  DISPOSITION OF  SPECIMEN:  PATHOLOGY  COUNTS:  YES  PLAN OF CARE: Transfer to PACU  [facility] HMD3:05 PMTD@  Note: This dictation was prepared with  Dragon/digital dictation along withSmart phrase technology. Any transcriptional errors that result from this process are unintentional.

## 2013-06-13 ENCOUNTER — Encounter (HOSPITAL_COMMUNITY): Payer: Self-pay | Admitting: Gynecology

## 2013-06-16 ENCOUNTER — Telehealth: Payer: Self-pay

## 2013-06-16 ENCOUNTER — Other Ambulatory Visit: Payer: Self-pay | Admitting: Gynecology

## 2013-06-16 MED ORDER — CIPROFLOXACIN HCL 250 MG PO TABS: 250.0000 mg | ORAL_TABLET | Freq: Two times a day (BID) | ORAL | Status: DC

## 2013-06-16 NOTE — Telephone Encounter (Signed)
Patient informed Rx in. 

## 2013-06-16 NOTE — Telephone Encounter (Signed)
Ciprofloxacin 250 mg twice a day x7 days. Office visit if symptoms continue

## 2013-06-16 NOTE — Telephone Encounter (Signed)
Had LAP RSO with Dr. Glenetta Hew last Thursday.  She said she had a catheter at surgery.  She now is complaining of burning with urination and has had post op UTI's with past surgery.  She is asking if you could just prescribe something for her or does she need to come in?  Please note allergies to include sulfonamides and penicillin.

## 2013-06-27 ENCOUNTER — Encounter: Payer: Self-pay | Admitting: Gynecology

## 2013-06-27 ENCOUNTER — Ambulatory Visit (INDEPENDENT_AMBULATORY_CARE_PROVIDER_SITE_OTHER): Payer: BC Managed Care – PPO | Admitting: Gynecology

## 2013-06-27 VITALS — BP 126/84

## 2013-06-27 DIAGNOSIS — Z09 Encounter for follow-up examination after completed treatment for conditions other than malignant neoplasm: Secondary | ICD-10-CM

## 2013-06-27 DIAGNOSIS — N390 Urinary tract infection, site not specified: Secondary | ICD-10-CM

## 2013-06-27 LAB — URINALYSIS W MICROSCOPIC + REFLEX CULTURE
Casts: NONE SEEN
Crystals: NONE SEEN
Glucose, UA: NEGATIVE mg/dL
Ketones, ur: NEGATIVE mg/dL
Leukocytes, UA: NEGATIVE
Nitrite: NEGATIVE
Protein, ur: NEGATIVE mg/dL
Specific Gravity, Urine: >1.03 — ABNORMAL HIGH (ref 1.005–1.030)
Urobilinogen, UA: 0.2 mg/dL (ref 0.0–1.0)
pH: 5.5 (ref 5.0–8.0)

## 2013-06-27 MED FILL — Heparin Sodium (Porcine) Inj 5000 Unit/ML: INTRAMUSCULAR | Qty: 1 | Status: AC

## 2013-06-27 NOTE — Progress Notes (Signed)
Patient presented to the office today for her postop appointment. Patient is status post laparoscopic right salpingo-oophorectomy along with fulguration of endometriotic implants on 06/12/2013.  Intraoperative findings: Right ovary with nodular excrescence was slight inflammation of the right fallopian tube. Normal left fallopian tube and site of recent left ovarian ovulation. Endometriotic implants were scattered at 2 different areas along the cul-de-sac anterior and posterior as well as pelvic sidewall.  Pathology report: Diagnosis Ovary and fallopian tube, right - BENIGN SEROUS CYSTADENOFIBROMA (1.5 CM) - BENIGN OVARIAN TISSUE WITH ENDOMETRIOSIS, ENDOSALPINGIOSIS, AND MULTIPLE FOLLICLE CYSTS. - NO ATYPIA OR MALIGNANCY. - BENIGN FALLOPIAN TUBAL TISSUE WITH PARATUBAL CYSTS, NO ATYPIA OR MALIGNANCY.  Exam: Abdomen: Soft nontender no rebound or guarding Incisional ports intact Pelvic: Urethra Skene was within normal limits Vagina: No lesions or discharge Cervix: No lesions or discharge Uterus anteverted normal size shape and consistency Adnexa: No probable mass or tenderness Rectal exam not done  Patient had informed me that recently she was treated at an urgent care for UTI with Cipro 250 mg twice a day for 3 days. She wanted to have her urine check she was asymptomatic otherwise.  Assessment/plan: Patient status post laparoscopic right salpingo-oophorectomy doing well scheduled to return back next year for her annual exam or when necessary. Urinalysis was obtained today which was negative. Patient started her oral contraceptive pills for cycle regulation.

## 2013-07-03 ENCOUNTER — Telehealth: Payer: Self-pay | Admitting: Oncology

## 2013-07-03 ENCOUNTER — Telehealth: Payer: Self-pay | Admitting: Internal Medicine

## 2013-07-03 NOTE — Telephone Encounter (Signed)
Pt called to schedule np appt 11/17 @ 1:30 w/Dr. Arbutus Ped Referring Tammy Worrell Dx-Chronic inc'd wbc and abn lab valves Welcome packet mailed.

## 2013-07-03 NOTE — Telephone Encounter (Signed)
Called pt to schedule np appt no able to leave message mailbox was full will try back.

## 2013-07-07 ENCOUNTER — Telehealth: Payer: Self-pay | Admitting: Internal Medicine

## 2013-07-07 NOTE — Telephone Encounter (Signed)
C/D 07/07/13 for appt. 07/21/13

## 2013-07-09 ENCOUNTER — Telehealth: Payer: Self-pay | Admitting: Hematology & Oncology

## 2013-07-09 NOTE — Telephone Encounter (Signed)
Pt called left message said her MD suggested she see Dr. Myna Hidalgo. She has appointment on 11-17 with Dr. Arbutus Ped. She wanted to know if she could be seen sooner by Dr. Myna Hidalgo I tried calling her voice mail is full and home number not working

## 2013-07-11 ENCOUNTER — Telehealth: Payer: Self-pay | Admitting: Hematology & Oncology

## 2013-07-11 NOTE — Telephone Encounter (Signed)
Per MD review tried to call pt MD said we can see her after 11-17 no answer or voice mail

## 2013-07-21 ENCOUNTER — Other Ambulatory Visit: Payer: Self-pay | Admitting: *Deleted

## 2013-07-21 ENCOUNTER — Encounter: Payer: Self-pay | Admitting: Internal Medicine

## 2013-07-21 ENCOUNTER — Ambulatory Visit (HOSPITAL_BASED_OUTPATIENT_CLINIC_OR_DEPARTMENT_OTHER): Payer: BC Managed Care – PPO

## 2013-07-21 ENCOUNTER — Other Ambulatory Visit (HOSPITAL_BASED_OUTPATIENT_CLINIC_OR_DEPARTMENT_OTHER): Payer: BC Managed Care – PPO | Admitting: Lab

## 2013-07-21 ENCOUNTER — Telehealth: Payer: Self-pay | Admitting: Internal Medicine

## 2013-07-21 ENCOUNTER — Ambulatory Visit (HOSPITAL_BASED_OUTPATIENT_CLINIC_OR_DEPARTMENT_OTHER): Payer: BC Managed Care – PPO | Admitting: Internal Medicine

## 2013-07-21 VITALS — BP 116/81 | HR 79 | Temp 98.6°F | Resp 19 | Ht 61.0 in | Wt 198.4 lb

## 2013-07-21 DIAGNOSIS — D72819 Decreased white blood cell count, unspecified: Secondary | ICD-10-CM

## 2013-07-21 DIAGNOSIS — D72829 Elevated white blood cell count, unspecified: Secondary | ICD-10-CM

## 2013-07-21 DIAGNOSIS — Z8619 Personal history of other infectious and parasitic diseases: Secondary | ICD-10-CM

## 2013-07-21 DIAGNOSIS — A0472 Enterocolitis due to Clostridium difficile, not specified as recurrent: Secondary | ICD-10-CM

## 2013-07-21 LAB — COMPREHENSIVE METABOLIC PANEL (CC13)
ALT: 13 U/L (ref 0–55)
AST: 17 U/L (ref 5–34)
Albumin: 3.4 g/dL — ABNORMAL LOW (ref 3.5–5.0)
Alkaline Phosphatase: 76 U/L (ref 40–150)
Anion Gap: 11 mEq/L (ref 3–11)
BUN: 11.2 mg/dL (ref 7.0–26.0)
CO2: 23 mEq/L (ref 22–29)
Calcium: 9.8 mg/dL (ref 8.4–10.4)
Chloride: 106 mEq/L (ref 98–109)
Creatinine: 0.8 mg/dL (ref 0.6–1.1)
Glucose: 83 mg/dl (ref 70–140)
Potassium: 4.3 mEq/L (ref 3.5–5.1)
Sodium: 140 mEq/L (ref 136–145)
Total Bilirubin: 0.41 mg/dL (ref 0.20–1.20)
Total Protein: 7.7 g/dL (ref 6.4–8.3)

## 2013-07-21 LAB — CBC WITH DIFFERENTIAL/PLATELET
BASO%: 0.6 % (ref 0.0–2.0)
Basophils Absolute: 0.1 10*3/uL (ref 0.0–0.1)
EOS%: 6.5 % (ref 0.0–7.0)
Eosinophils Absolute: 0.7 10*3/uL — ABNORMAL HIGH (ref 0.0–0.5)
HCT: 39.6 % (ref 34.8–46.6)
HGB: 13 g/dL (ref 11.6–15.9)
LYMPH%: 34.8 % (ref 14.0–49.7)
MCH: 28.5 pg (ref 25.1–34.0)
MCHC: 32.8 g/dL (ref 31.5–36.0)
MCV: 86.8 fL (ref 79.5–101.0)
MONO#: 0.6 10*3/uL (ref 0.1–0.9)
MONO%: 4.9 % (ref 0.0–14.0)
NEUT#: 6.1 10*3/uL (ref 1.5–6.5)
NEUT%: 53.2 % (ref 38.4–76.8)
Platelets: 333 10*3/uL (ref 145–400)
RBC: 4.57 10*6/uL (ref 3.70–5.45)
RDW: 13.3 % (ref 11.2–14.5)
WBC: 11.4 10*3/uL — ABNORMAL HIGH (ref 3.9–10.3)
lymph#: 4 10*3/uL — ABNORMAL HIGH (ref 0.9–3.3)

## 2013-07-21 LAB — LACTATE DEHYDROGENASE (CC13): LDH: 128 U/L (ref 125–245)

## 2013-07-21 NOTE — Patient Instructions (Signed)
Continue on observation for now with repeat CBC in 6 weeks.

## 2013-07-21 NOTE — Progress Notes (Signed)
[facility] [phone]   [phone]  CONSULT NOTE  REFERRING PHYSICIAN: Dr. Reynaldo Minium  REASON FOR CONSULTATION:  42 years old white female with persistent leukocytosis  HPI [name] is a 41 y.o. female was past medical history significant for multiple medical problems including hypertension, Lyme disease,Bell's palsy, mononucleosis with pneumonia and ICU admission, urinary tract infection, C. difficile status post several medications and currently on Dificid as well as history of ovarian cystic disease status post right oophorectomy and Epstein-Barr virus infection. The patient was seen recently by her primary care physician and was noted to have persistent leukocytosis. Her CBC on 05/07/2013 showed white blood count of 12.1, hemoglobin 13.3, hematocrit 39.5 and platelets count of 415, 000. The CBC on a 02/13/2013, showed normal white blood count of 10.6, hemoglobin 13.1, hematocrit 38.4 and platelets count of 315,000. The patient was referred to me today for further evaluation and recommendation regarding her persistent leukocytosis.  She continues to suffer from C. Difficile. She was treated in the past was Flagyl and vancomycin and currently on Dificid. She also underwent fecal transplant from her father which did help for a few days and the patient had recurrence of her symptoms. Since starting Dificid, her diarrhea is about 5 times a day compared to 10 - 15 times a day previously. She is feeling fine today except for fatigue and aching pain all over her body with flulike symptoms. She denied having any significant weight loss but continues to have night sweats that has been going on for years the patient also has mild nausea and currently on Compazine and Phenergan as needed basis. She denied having any significant chest pain, shortness breath, cough or hemoptysis. She has no fever or chills. Her family history significant for a mother with  diabetes mellitus, coronary artery disease and stroke, father with benign prostatic hypertrophy, paternal grandmother with breast cancer and paternal grandfather with prostate cancer. The patient is single and has no children. 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 She has no history of smoking, alcohol or drug abuse.  HPI  Past Medical History  Diagnosis Date  . Mononucleosis 5-10  . Ovarian cyst     Serous Cystadenofibroma-Left ovary  . Clostridium difficile infection   . Mold contact confirmed     Toxic Mold syndrome  . Immune deficiency disorder   . Anxiety   . Asthma   . GERD (gastroesophageal reflux disease)   . Hypertension   . Thyroid disease   . PONV (postoperative nausea and vomiting)   . Arrhythmia     heart races when pt in pain  . Lyme disease   . Hypothyroidism   . Shortness of breath     on exertion  . Fibromyalgia     Past Surgical History  Procedure Laterality Date  . Tonsillectomy  2009  . Fecal transplant    . Groin cyst    . Nose cyst    . Pelvic laparoscopy      DL Ovarian cystectomy-Left  . Cholecystectomy    . Laparoscopy Right 06/12/2013    Procedure: LAPAROSCOPY OPERATIVE  fulgeration of endometriosis, right salpingooophorectomy;  Surgeon: Ok Edwards, MD;  Location: [facility];  Service: Gynecology;  Laterality: Right;  . Oophorectomy Right 06/12/2013    Procedure: OOPHORECTOMY;POSS RIGHT SALPING OOPHORECTOMY;  Surgeon: Ok Edwards, MD;  Location: [facility];  Service: Gynecology;  Laterality: Right;    Family History  Problem Relation Age of Onset  . Hypertension Mother   . Diabetes Mother   . Breast cancer Maternal Aunt     Age 40  . Pancreatic cancer Maternal Grandmother   . Heart disease Maternal Grandfather   . Breast cancer Paternal Grandmother     Age 5    Social History History  Substance Use Topics  . Smoking status: Never Smoker   . Smokeless tobacco: Never Used  . Alcohol Use: No    Allergies    Allergen Reactions  . Allegra [Fexofenadine Hcl] Shortness Of Breath and Rash  . Penicillins Itching and Swelling    Can tolerate Amoxicillin OK.  . Doxycycline   . Morphine     REACTION: insomnia  . Oseltamivir Phosphate Itching and Nausea And Vomiting  . Other     NUT ALLERGY  . Vancomycin     Red man's Syndrome.  . Diphenhydramine Hcl Palpitations and Rash  . Sulfonamide Derivatives Rash    Current Outpatient Prescriptions  Medication Sig Dispense Refill  . ALPRAZolam (XANAX) 1 MG tablet Take 1 mg by mouth at bedtime as needed.      . bisoprolol (ZEBETA) 10 MG tablet Take 10 mg by mouth daily.      . Cholecalciferol (VITAMIN D PO) Take 1 tablet by mouth daily.       . citalopram (CELEXA) 40 MG tablet Take 40 mg by mouth daily.      . cyclobenzaprine (FLEXERIL) 10 MG tablet Take 10 mg by mouth at bedtime.      . drospirenone-ethinyl estradiol (YASMIN,ZARAH,SYEDA) 3-0.03 MG tablet Take 1 tablet by mouth daily.      . fentaNYL (DURAGESIC - DOSED MCG/HR) 25 MCG/HR Place 1 patch onto the skin every other day.       . fidaxomicin (DIFICID) 200 MG TABS tablet Take 200 mg by mouth 2 (two) times daily. X 10 days, started 07/17/13.  For C-Dif      . HYOSCYAMINE PO Take 0.125 mg by mouth daily.       . metoCLOPramide (REGLAN) 10 MG tablet Take 1 tablet (10 mg total) by mouth 3 (three) times daily with meals.  30 tablet  1  . oxyCODONE-acetaminophen (PERCOCET) 5-325 MG per tablet Take 0.5 tablets by mouth daily.      . pantoprazole (PROTONIX) 20 MG tablet Take 20 mg by mouth daily.      . Probiotic Product (ALIGN PO) Take by mouth as needed.      . prochlorperazine (COMPAZINE) 10 MG tablet Take 10 mg by mouth every 6 (six) hours as needed.      . promethazine (PHENERGAN) 25 MG tablet Take 25 mg by mouth every 6 (six) hours as needed.      Marland Kitchen scopolamine (TRANSDERM-SCOP) 1.5 MG Place 1 patch onto the skin once.      . thyroid (ARMOUR) 30 MG tablet Take 30 mg by mouth daily before breakfast.       . valACYclovir (VALTREX) 500 MG tablet Take 500 mg by mouth 2 (two) times daily.      . vitamin B-12 (CYANOCOBALAMIN) 500 MCG tablet Take 500 mcg by mouth daily.       No current facility-administered medications for this visit.    Review of Systems  Constitutional: positive for fatigue Eyes: negative Ears, nose, mouth, throat, and face: negative Respiratory: negative Cardiovascular: negative Gastrointestinal: positive for diarrhea and nausea Genitourinary:negative Integument/breast: negative Hematologic/lymphatic: negative Musculoskeletal:negative Neurological: negative Behavioral/Psych: negative Endocrine: negative Allergic/Immunologic: negative  Physical Exam  WUJ:WJXBJ, healthy, no distress, well nourished and well developed SKIN: skin color, texture, turgor are normal, no rashes or significant lesions HEAD: Normocephalic, No masses, lesions, tenderness or abnormalities EYES: normal, PERRLA EARS: External ears normal, Canals clear OROPHARYNX:no exudate, no erythema and lips, buccal mucosa, and tongue normal  NECK: supple, no adenopathy, no JVD LYMPH:  no palpable lymphadenopathy, no hepatosplenomegaly BREAST:not examined LUNGS: clear to auscultation , and palpation HEART: regular rate & rhythm, no murmurs and no gallops ABDOMEN:abdomen soft, non-tender, normal bowel sounds and no masses or organomegaly BACK: Back symmetric, no curvature. EXTREMITIES:no edema, no skin discoloration  NEURO: alert & oriented x 3 with fluent speech, no focal motor/sensory deficits  PERFORMANCE STATUS: ECOG 1  LABORATORY DATA: Lab Results  Component Value Date   WBC 11.4* 07/21/2013   HGB 13.0 07/21/2013   HCT 39.6 07/21/2013   MCV 86.8 07/21/2013   PLT 333 07/21/2013      Chemistry      Component Value Date/Time   NA 140 07/21/2013 1354   NA 139 06/04/2013 1120   K 4.3 07/21/2013 1354   K 3.9 06/04/2013 1120   CL 102 06/04/2013 1120   CO2 23 07/21/2013 1354   CO2 29  06/04/2013 1120   BUN 11.2 07/21/2013 1354   BUN 10 06/04/2013 1120   CREATININE 0.8 07/21/2013 1354   CREATININE 0.73 06/04/2013 1120      Component Value Date/Time   CALCIUM 9.8 07/21/2013 1354   CALCIUM 8.8 06/04/2013 1120   ALKPHOS 76 07/21/2013 1354   ALKPHOS 65 09/05/2009 0122   AST 17 07/21/2013 1354   AST 43* 09/05/2009 0122   ALT 13 07/21/2013 1354   ALT 11 09/05/2009 0122   BILITOT 0.41 07/21/2013 1354   BILITOT 0.8 09/05/2009 0122       RADIOGRAPHIC STUDIES: No results found.  ASSESSMENT: This is a very pleasant 42 years old white female with mild leukocytosis most likely reactive in nature or secondary to current C. difficile infection, but myeloproliferative disorder is not completely excluded especially with her previous positive history of Epstein-Barr viral infection.   PLAN: I have a lengthy discussion with the patient and her father today about her current disease status and treatment options. I discussed the etiology of leukocytosis with the patient including her current infection. I recommended for her to continue on observation for now with repeat CBC in 6 weeks. If she continues to have persistent leukocytosis after resolution of C. Difficile, I would consider ordering molecular study to rule out CML or other myeloproliferative disorder. The patient will continue her routine followup visit with her primary care physician and her gastroenterologist as scheduled. She was advised to call immediately if she has any concerning symptoms in the interval. The patient voices understanding of current disease status and treatment options and is in agreement with the current care plan.  All questions were answered. The patient knows to call the clinic with any problems, questions or concerns. We can certainly see the patient much sooner if necessary.  Thank you so much for allowing me to participate in the care of [name]. I will continue to follow up the patient with you  and assist in her care.  I spent 40 minutes counseling the patient face to face. The total time spent in the appointment was 55 minutes.  Chanette Demo K. 07/21/2013, 3:20 PM

## 2013-07-21 NOTE — Progress Notes (Signed)
Checked in new patient with no financial issues. She has not been to [place]. °

## 2013-07-21 NOTE — Telephone Encounter (Signed)
Worked 11/17 POF mae appts AVS and CAL given to pt shh

## 2013-09-09 ENCOUNTER — Encounter (INDEPENDENT_AMBULATORY_CARE_PROVIDER_SITE_OTHER): Payer: Self-pay

## 2013-09-09 ENCOUNTER — Ambulatory Visit (HOSPITAL_BASED_OUTPATIENT_CLINIC_OR_DEPARTMENT_OTHER): Payer: BC Managed Care – PPO | Admitting: Internal Medicine

## 2013-09-09 ENCOUNTER — Encounter: Payer: Self-pay | Admitting: Internal Medicine

## 2013-09-09 ENCOUNTER — Other Ambulatory Visit (HOSPITAL_BASED_OUTPATIENT_CLINIC_OR_DEPARTMENT_OTHER): Payer: BC Managed Care – PPO

## 2013-09-09 VITALS — BP 137/95 | HR 78 | Temp 97.9°F | Resp 18 | Ht 61.0 in | Wt 199.2 lb

## 2013-09-09 DIAGNOSIS — D72829 Elevated white blood cell count, unspecified: Secondary | ICD-10-CM

## 2013-09-09 DIAGNOSIS — Z87898 Personal history of other specified conditions: Secondary | ICD-10-CM

## 2013-09-09 LAB — CBC WITH DIFFERENTIAL/PLATELET
BASO%: 0.6 % (ref 0.0–2.0)
Basophils Absolute: 0.1 10*3/uL (ref 0.0–0.1)
EOS%: 4.1 % (ref 0.0–7.0)
Eosinophils Absolute: 0.4 10*3/uL (ref 0.0–0.5)
HCT: 40.9 % (ref 34.8–46.6)
HGB: 13.9 g/dL (ref 11.6–15.9)
LYMPH%: 33.2 % (ref 14.0–49.7)
MCH: 30.3 pg (ref 25.1–34.0)
MCHC: 34 g/dL (ref 31.5–36.0)
MCV: 89.1 fL (ref 79.5–101.0)
MONO#: 0.5 10*3/uL (ref 0.1–0.9)
MONO%: 5.7 % (ref 0.0–14.0)
NEUT#: 5.4 10*3/uL (ref 1.5–6.5)
NEUT%: 56.4 % (ref 38.4–76.8)
Platelets: 302 10*3/uL (ref 145–400)
RBC: 4.59 10*6/uL (ref 3.70–5.45)
RDW: 13.6 % (ref 11.2–14.5)
WBC: 9.6 10*3/uL (ref 3.9–10.3)
lymph#: 3.2 10*3/uL (ref 0.9–3.3)

## 2013-09-09 LAB — COMPREHENSIVE METABOLIC PANEL (CC13)
ALT: 18 U/L (ref 0–55)
AST: 19 U/L (ref 5–34)
Albumin: 3.5 g/dL (ref 3.5–5.0)
Alkaline Phosphatase: 80 U/L (ref 40–150)
Anion Gap: 11 mEq/L (ref 3–11)
BUN: 11 mg/dL (ref 7.0–26.0)
CO2: 27 mEq/L (ref 22–29)
Calcium: 9.9 mg/dL (ref 8.4–10.4)
Chloride: 104 mEq/L (ref 98–109)
Creatinine: 0.9 mg/dL (ref 0.6–1.1)
Glucose: 95 mg/dl (ref 70–140)
Potassium: 4.3 mEq/L (ref 3.5–5.1)
Sodium: 142 mEq/L (ref 136–145)
Total Bilirubin: 0.53 mg/dL (ref 0.20–1.20)
Total Protein: 7.9 g/dL (ref 6.4–8.3)

## 2013-09-09 LAB — LACTATE DEHYDROGENASE (CC13): LDH: 129 U/L (ref 125–245)

## 2013-09-09 NOTE — Progress Notes (Signed)
[place] [phone]   [phone]  OFFICE PROGRESS NOTE  Horatio Pel, MD [address] [place] [address]  DIAGNOSIS: Reactive leukocytosis, most likely secondary to C. difficile, resolved.  PRIOR THERAPY: None  CURRENT THERAPY: None  INTERVAL HISTORY: [name] 43 y.o. female returns to the clinic today for followup visit accompanied by her father. The patient is feeling fine today with no specific complaints. She was recently tested for the C. difficile and that result was negative. She is feeling much better today. She denied having any significant weight loss or night sweats. The patient denied having any chest pain, shortness breath, cough or hemoptysis. She denied having any palpable lymphadenopathy. She has repeat CBC performed earlier today and she is here for evaluation of her condition.  MEDICAL HISTORY: Past Medical History  Diagnosis Date  . Mononucleosis 5-10  . Ovarian cyst     Serous Cystadenofibroma-Left ovary  . Clostridium difficile infection   . Mold contact confirmed     Toxic Mold syndrome  . Immune deficiency disorder   . Anxiety   . Asthma   . GERD (gastroesophageal reflux disease)   . Hypertension   . Thyroid disease   . PONV (postoperative nausea and vomiting)   . Arrhythmia     heart races when pt in pain  . Lyme disease   . Hypothyroidism   . Shortness of breath     on exertion  . Fibromyalgia     ALLERGIES:  is allergic to allegra; penicillins; doxycycline; morphine; oseltamivir phosphate; other; vancomycin; diphenhydramine hcl; and sulfonamide derivatives.  MEDICATIONS:  Current Outpatient Prescriptions  Medication Sig Dispense Refill  . ALPRAZolam (XANAX) 1 MG tablet Take 1 mg by mouth at bedtime as needed.      . bisoprolol (ZEBETA) 10 MG tablet Take 10 mg by mouth daily.      . Cholecalciferol (VITAMIN D PO) Take 1 tablet by mouth daily.       . citalopram  (CELEXA) 40 MG tablet Take 40 mg by mouth daily.      . cyclobenzaprine (FLEXERIL) 10 MG tablet Take 10 mg by mouth at bedtime.      . drospirenone-ethinyl estradiol (YASMIN,ZARAH,SYEDA) 3-0.03 MG tablet Take 1 tablet by mouth daily.      . fentaNYL (DURAGESIC - DOSED MCG/HR) 25 MCG/HR Place 1 patch onto the skin every other day.       . fidaxomicin (DIFICID) 200 MG TABS tablet Take 200 mg by mouth 2 (two) times daily. X 10 days, started 07/17/13.  For C-Dif      . HYOSCYAMINE PO Take 0.125 mg by mouth daily.       . metoCLOPramide (REGLAN) 10 MG tablet Take 1 tablet (10 mg total) by mouth 3 (three) times daily with meals.  30 tablet  1  . oxyCODONE-acetaminophen (PERCOCET) 5-325 MG per tablet Take 0.5 tablets by mouth daily.      . pantoprazole (PROTONIX) 20 MG tablet Take 20 mg by mouth daily.      . Probiotic Product (ALIGN PO) Take by mouth as needed.      . prochlorperazine (COMPAZINE) 10 MG tablet Take 10 mg by mouth every 6 (six) hours as needed.      . promethazine (PHENERGAN) 25 MG tablet Take 25 mg by mouth every 6 (six) hours as needed.      Marland Kitchen scopolamine (TRANSDERM-SCOP) 1.5 MG Place 1 patch onto the skin once.      Marland Kitchen  thyroid (ARMOUR) 30 MG tablet Take 30 mg by mouth daily before breakfast.      . valACYclovir (VALTREX) 500 MG tablet Take 500 mg by mouth 2 (two) times daily.      . vitamin B-12 (CYANOCOBALAMIN) 500 MCG tablet Take 500 mcg by mouth daily.       No current facility-administered medications for this visit.    SURGICAL HISTORY:  Past Surgical History  Procedure Laterality Date  . Tonsillectomy  2009  . Fecal transplant    . Groin cyst    . Nose cyst    . Pelvic laparoscopy      DL Ovarian cystectomy-Left  . Cholecystectomy    . Laparoscopy Right 06/12/2013    Procedure: LAPAROSCOPY OPERATIVE  fulgeration of endometriosis, right salpingooophorectomy;  Surgeon: Terrance Mass, MD;  Location: [facility];  Service: Gynecology;  Laterality: Right;  . Oophorectomy  Right 06/12/2013    Procedure: OOPHORECTOMY;POSS RIGHT [place];  Surgeon: Terrance Mass, MD;  Location: [facility];  Service: Gynecology;  Laterality: Right;    REVIEW OF SYSTEMS:  A comprehensive review of systems was negative.   PHYSICAL EXAMINATION: General appearance: alert, cooperative and no distress Head: Normocephalic, without obvious abnormality, atraumatic Neck: no adenopathy, no JVD, supple, symmetrical, trachea midline and thyroid not enlarged, symmetric, no tenderness/mass/nodules Lymph nodes: Cervical, supraclavicular, and axillary nodes normal. Resp: clear to auscultation bilaterally Back: symmetric, no curvature. ROM normal. No CVA tenderness. Cardio: regular rate and rhythm, S1, S2 normal, no murmur, click, rub or gallop GI: soft, non-tender; bowel sounds normal; no masses,  no organomegaly Extremities: extremities normal, atraumatic, no cyanosis or edema  ECOG PERFORMANCE STATUS: 0 - Asymptomatic  Blood pressure 137/95, pulse 78, temperature 97.9 F (36.6 C), temperature source Oral, resp. rate 18, height 5\' 1"  (1.549 m), weight 199 lb 3.2 oz (90.357 kg), SpO2 98.00%.  LABORATORY DATA: Lab Results  Component Value Date   WBC 9.6 09/09/2013   HGB 13.9 09/09/2013   HCT 40.9 09/09/2013   MCV 89.1 09/09/2013   PLT 302 09/09/2013      Chemistry      Component Value Date/Time   NA 140 07/21/2013 1354   NA 139 06/04/2013 1120   K 4.3 07/21/2013 1354   K 3.9 06/04/2013 1120   CL 102 06/04/2013 1120   CO2 23 07/21/2013 1354   CO2 29 06/04/2013 1120   BUN 11.2 07/21/2013 1354   BUN 10 06/04/2013 1120   CREATININE 0.8 07/21/2013 1354   CREATININE 0.73 06/04/2013 1120      Component Value Date/Time   CALCIUM 9.8 07/21/2013 1354   CALCIUM 8.8 06/04/2013 1120   ALKPHOS 76 07/21/2013 1354   ALKPHOS 65 09/05/2009 0122   AST 17 07/21/2013 1354   AST 43* 09/05/2009 0122   ALT 13 07/21/2013 1354   ALT 11 09/05/2009 0122   BILITOT 0.41 07/21/2013 1354   BILITOT 0.8 09/05/2009  0122       RADIOGRAPHIC STUDIES: No results found.  ASSESSMENT AND PLAN: This is a very pleasant 43 years old white female who was evaluated for persistent lymphocytosis and that was thought to be reactive in nature secondary to persistent C. difficile which is currently improved in the last test was negative. Her CBC today was normal with normal white blood count. I discussed the lab result and give a copy of the report to the patient. I recommended for her to continue her routine observation and followup visit with her primary care physician.  I don't see a need for any further evaluation or intervention in her case. I would be happy to see her in the future if there is any concerning hematologic or oncologic issues. The patient voices understanding of current disease status and treatment options and is in agreement with the current care plan. All questions were answered. The patient knows to call the clinic with any problems, questions or concerns. We can certainly see the patient much sooner if necessary.

## 2013-10-08 ENCOUNTER — Encounter: Payer: Self-pay | Admitting: Gynecology

## 2013-10-08 ENCOUNTER — Ambulatory Visit (INDEPENDENT_AMBULATORY_CARE_PROVIDER_SITE_OTHER): Payer: BC Managed Care – PPO | Admitting: Gynecology

## 2013-10-08 DIAGNOSIS — R102 Pelvic and perineal pain: Secondary | ICD-10-CM

## 2013-10-08 DIAGNOSIS — N921 Excessive and frequent menstruation with irregular cycle: Secondary | ICD-10-CM

## 2013-10-08 DIAGNOSIS — N949 Unspecified condition associated with female genital organs and menstrual cycle: Secondary | ICD-10-CM

## 2013-10-08 DIAGNOSIS — N938 Other specified abnormal uterine and vaginal bleeding: Secondary | ICD-10-CM

## 2013-10-08 LAB — CBC WITH DIFFERENTIAL/PLATELET
Basophils Absolute: 0 10*3/uL (ref 0.0–0.1)
Basophils Relative: 0 % (ref 0–1)
Eosinophils Absolute: 0.3 10*3/uL (ref 0.0–0.7)
Eosinophils Relative: 3 % (ref 0–5)
HCT: 42.8 % (ref 36.0–46.0)
Hemoglobin: 14.3 g/dL (ref 12.0–15.0)
Lymphocytes Relative: 30 % (ref 12–46)
Lymphs Abs: 3.8 10*3/uL (ref 0.7–4.0)
MCH: 29.6 pg (ref 26.0–34.0)
MCHC: 33.4 g/dL (ref 30.0–36.0)
MCV: 88.6 fL (ref 78.0–100.0)
Monocytes Absolute: 0.7 10*3/uL (ref 0.1–1.0)
Monocytes Relative: 6 % (ref 3–12)
Neutro Abs: 7.7 10*3/uL (ref 1.7–7.7)
Neutrophils Relative %: 61 % (ref 43–77)
Platelets: 419 10*3/uL — ABNORMAL HIGH (ref 150–400)
RBC: 4.83 MIL/uL (ref 3.87–5.11)
RDW: 12.9 % (ref 11.5–15.5)
WBC: 12.5 10*3/uL — ABNORMAL HIGH (ref 4.0–10.5)

## 2013-10-08 MED ORDER — NORETHIN ACE-ETH ESTRAD-FE 1-20 MG-MCG PO TABS: ORAL_TABLET | ORAL | Status: DC

## 2013-10-08 NOTE — Patient Instructions (Signed)
Endometrial Biopsy Endometrial biopsy is a procedure in which a tissue sample is taken from inside the uterus. The tissue sample is then looked at under a microscope to see if the tissue is normal or abnormal. The endometrium is the lining of the uterus. This procedure helps determine where you are in your menstrual cycle and how hormone levels are affecting the lining of the uterus. This procedure may also be used to evaluate uterine bleeding or to diagnose endometrial cancer, tuberculosis, polyps, or inflammatory conditions.  LET YOUR HEALTH CARE PROVIDER KNOW ABOUT:  Any allergies you have.  All medicines you are taking, including vitamins, herbs, eye drops, creams, and over-the-counter medicines.  Previous problems you or members of your family have had with the use of anesthetics.  Any blood disorders you have.  Previous surgeries you have had.  Medical conditions you have.  Possibility of pregnancy. RISKS AND COMPLICATIONS Generally, this is a safe procedure. However, as with any procedure, complications can occur. Possible complications include:  Bleeding.  Pelvic infection.  Puncture of the uterine wall with the biopsy device (rare). BEFORE THE PROCEDURE   Keep a record of your menstrual cycles as directed by your health care provider. You may need to schedule your procedure for a specific time in your cycle.  You may want to bring a sanitary pad to wear home after the procedure.  Arrange for someone to drive you home after the procedure if you will be given a medicine to help you relax (sedative). PROCEDURE   You may be given a sedative to relax you.  You will lie on an exam table with your feet and legs supported as in a pelvic exam.  Your health care provider will insert an instrument (speculum) into your vagina to see your cervix.  Your cervix will be cleansed with an antiseptic solution. A medicine (local anesthetic) will be used to numb the cervix.  A forceps  instrument (tenaculum) will be used to hold your cervix steady for the biopsy.  A thin, rodlike instrument (uterine sound) will be inserted through your cervix to determine the length of your uterus and the location where the biopsy sample will be removed.  A thin, flexible tube (catheter) will be inserted through your cervix and into the uterus. The catheter is used to collect the biopsy sample from your endometrial tissue.  The catheter and speculum will then be removed, and the tissue sample will be sent to a lab for examination. AFTER THE PROCEDURE  You will rest in a recovery area until you are ready to go home.  You may have mild cramping and a small amount of vaginal bleeding for a few days after the procedure. This is normal.  Make sure you find out how to get your test results. Document Released: 12/22/2004 Document Revised: 04/23/2013 Document Reviewed: 02/05/2013 [employer] Patient Information 2014 [employer].  

## 2013-10-08 NOTE — Addendum Note (Signed)
Addended by: Alen Blew on: 10/08/2013 03:05 PM   Modules accepted: Orders

## 2013-10-08 NOTE — Progress Notes (Addendum)
   Patient presented to the office today with a complaint of breakthrough bleeding on her oral contraceptive pill. Review of direct indicated in October 2014 she had a right salpingo-oophorectomy for a benign serous cystadenofibroma. She was to jump started on her menstrual cycle with Provera which she took in October and then started the [facility]. She stated that she had a normal cycle 4 days in November but since December she had been spotting on a weekly basis and that she had a heavy cycle in January still continues spotting. She is overweight.  Exam: Abdomen: Soft nontender no rebound or guarding Pelvic: The urethra Skene was within normal limits Vagina: Some menstrual blood was noted Cervix: No gross lesions on inspection Bimanual exam: Uterus slightly anteverted no palpable mass or tenderness adnexa with no palpable mass or tenderness Rectal exam: Not done  Due to patient's age we decided to proceed with endometrial biopsy today. The patient was counseled and her cervix was cleansed with Betadine solution. A sterile Pipelle was introduced into the uterine cavity and tissue was submitted for histological evaluation. Of note patient is not sexually active.  A CBC was ordered resulting in some of this dictation. The plan will be to discontinue this current oral contraceptive pill that has  despirinone as a progestin and an estrogen content 30 mcg of ethinyl  estradiol. Because of her age she should be on a 20 mcg estrogen pill. She is on an oral contraceptive pill for cycle control as well as for cyst suppression. We are going to start her on the 20 mcg Junel 1/20 and have her take a continuous whereby she would withdrawal every 3 months. Once again the risks benefits and pros and cons of oral contraceptive pills were discussed. She is a nonsmoker. If this regimen does not work we discussed proceeding with with a Mirena IUD. Literature information was provided. Patient has been followed by Dr.  Earlie Server for her persistent lymphocytosis that was thought to be reactive in nature secondary to a persistent C. difficile which according to his last office note has improved. Her recent CBC at his office with normal with normal white count back in January of this year.

## 2013-10-20 ENCOUNTER — Other Ambulatory Visit (HOSPITAL_COMMUNITY): Payer: Self-pay | Admitting: Nurse Practitioner

## 2013-10-20 DIAGNOSIS — A6920 Lyme disease, unspecified: Secondary | ICD-10-CM

## 2013-10-22 ENCOUNTER — Ambulatory Visit (HOSPITAL_COMMUNITY): Admission: RE | Admit: 2013-10-22 | Payer: BC Managed Care – PPO | Source: Ambulatory Visit

## 2013-10-27 ENCOUNTER — Ambulatory Visit (HOSPITAL_COMMUNITY)
Admission: RE | Admit: 2013-10-27 | Discharge: 2013-10-27 | Disposition: A | Discharge status: Home / Self Care | Payer: BC Managed Care – PPO | Source: Ambulatory Visit | Attending: Nurse Practitioner | Admitting: Nurse Practitioner

## 2013-10-27 ENCOUNTER — Other Ambulatory Visit (HOSPITAL_COMMUNITY): Payer: Self-pay | Admitting: Nurse Practitioner

## 2013-10-27 DIAGNOSIS — A6920 Lyme disease, unspecified: Secondary | ICD-10-CM | POA: Insufficient documentation

## 2013-10-27 NOTE — Procedures (Signed)
Interventional Radiology Procedure Note  Procedure: Placement of a 40 cm left basilic vein PICC.  Catheter tip at the cavoatrial junction and ready for use.  Complications: None Recommendations: - Routine line care  Signed,  Criselda Peaches, MD [facility]

## 2013-11-20 ENCOUNTER — Other Ambulatory Visit (HOSPITAL_COMMUNITY): Payer: Self-pay | Admitting: Nurse Practitioner

## 2013-11-20 ENCOUNTER — Ambulatory Visit (HOSPITAL_COMMUNITY)
Admission: RE | Admit: 2013-11-20 | Discharge: 2013-11-20 | Disposition: A | Discharge status: Home / Self Care | Payer: BC Managed Care – PPO | Source: Ambulatory Visit | Attending: Nurse Practitioner | Admitting: Nurse Practitioner

## 2013-11-20 DIAGNOSIS — A6920 Lyme disease, unspecified: Secondary | ICD-10-CM

## 2013-11-20 DIAGNOSIS — Y849 Medical procedure, unspecified as the cause of abnormal reaction of the patient, or of later complication, without mention of misadventure at the time of the procedure: Secondary | ICD-10-CM | POA: Insufficient documentation

## 2013-11-20 DIAGNOSIS — T82898A Other specified complication of vascular prosthetic devices, implants and grafts, initial encounter: Secondary | ICD-10-CM | POA: Insufficient documentation

## 2013-11-20 DIAGNOSIS — Z792 Long term (current) use of antibiotics: Secondary | ICD-10-CM | POA: Insufficient documentation

## 2013-11-20 MED ORDER — LIDOCAINE HCL 1 % IJ SOLN
INTRAMUSCULAR | Status: AC
  Filled 2013-11-20: qty 20

## 2013-11-20 MED ORDER — HEPARIN SOD (PORK) LOCK FLUSH 100 UNIT/ML IV SOLN: 500.0000 [IU] | Freq: Once | INTRAVENOUS | Status: DC

## 2013-11-20 MED ORDER — HEPARIN SOD (PORK) LOCK FLUSH 100 UNIT/ML IV SOLN
INTRAVENOUS | Status: AC
  Filled 2013-11-20: qty 5

## 2013-11-20 NOTE — Procedures (Signed)
Successful exchange of left arm single lumen power injectable PICC secondary to malfunction with no blood return. Length 40cm Tip at lower SVC/RA No complications Ready for use.  Tsosie Billing PA-C Interventional Radiology  11/20/13  4:33 PM

## 2013-11-30 ENCOUNTER — Encounter (HOSPITAL_BASED_OUTPATIENT_CLINIC_OR_DEPARTMENT_OTHER): Payer: Self-pay | Admitting: Emergency Medicine

## 2013-11-30 ENCOUNTER — Emergency Department (HOSPITAL_BASED_OUTPATIENT_CLINIC_OR_DEPARTMENT_OTHER)
Admission: EM | Admit: 2013-11-30 | Discharge: 2013-11-30 | Disposition: A | Discharge status: Home / Self Care | Payer: BC Managed Care – PPO | Attending: Emergency Medicine | Admitting: Emergency Medicine

## 2013-11-30 DIAGNOSIS — Z880 Allergy status to penicillin: Secondary | ICD-10-CM | POA: Insufficient documentation

## 2013-11-30 DIAGNOSIS — E039 Hypothyroidism, unspecified: Secondary | ICD-10-CM | POA: Insufficient documentation

## 2013-11-30 DIAGNOSIS — Z95828 Presence of other vascular implants and grafts: Secondary | ICD-10-CM

## 2013-11-30 DIAGNOSIS — Z8739 Personal history of other diseases of the musculoskeletal system and connective tissue: Secondary | ICD-10-CM | POA: Insufficient documentation

## 2013-11-30 DIAGNOSIS — F411 Generalized anxiety disorder: Secondary | ICD-10-CM | POA: Insufficient documentation

## 2013-11-30 DIAGNOSIS — M79609 Pain in unspecified limb: Secondary | ICD-10-CM | POA: Insufficient documentation

## 2013-11-30 DIAGNOSIS — J45909 Unspecified asthma, uncomplicated: Secondary | ICD-10-CM | POA: Insufficient documentation

## 2013-11-30 DIAGNOSIS — Z79899 Other long term (current) drug therapy: Secondary | ICD-10-CM | POA: Insufficient documentation

## 2013-11-30 DIAGNOSIS — Z8742 Personal history of other diseases of the female genital tract: Secondary | ICD-10-CM | POA: Insufficient documentation

## 2013-11-30 DIAGNOSIS — G8918 Other acute postprocedural pain: Secondary | ICD-10-CM | POA: Insufficient documentation

## 2013-11-30 DIAGNOSIS — I10 Essential (primary) hypertension: Secondary | ICD-10-CM | POA: Insufficient documentation

## 2013-11-30 DIAGNOSIS — Z8619 Personal history of other infectious and parasitic diseases: Secondary | ICD-10-CM | POA: Insufficient documentation

## 2013-11-30 DIAGNOSIS — Z9889 Other specified postprocedural states: Secondary | ICD-10-CM | POA: Insufficient documentation

## 2013-11-30 DIAGNOSIS — K219 Gastro-esophageal reflux disease without esophagitis: Secondary | ICD-10-CM | POA: Insufficient documentation

## 2013-11-30 MED ORDER — MUPIROCIN CALCIUM 2 % EX CREA: 1.0000 "application " | TOPICAL_CREAM | Freq: Two times a day (BID) | CUTANEOUS | Status: DC

## 2013-11-30 NOTE — Discharge Instructions (Signed)
PICC Home Guide A peripherally inserted central catheter (PICC) is a long, thin, flexible tube that is inserted into a vein in the upper arm. It is a form of intravenous (IV) access. It is considered to be a "central" line because the tip of the PICC ends in a large vein in your chest. This large vein is called the superior vena cava (SVC). The PICC tip ends in the SVC because there is a lot of blood flow in the SVC. This allows medicines and IV fluids to be quickly distributed throughout the body. The PICC is inserted using a sterile technique by a specially trained nurse or physician. After the PICC is inserted, a chest X-ray exam is done to be sure it is in the correct place.  A PICC may be placed for different reasons, such as:  To give medicines and liquid nutrition that can only be given through a central line. Examples are:  Certain antibiotic treatments.  Chemotherapy.  Total parenteral nutrition (TPN).  To take frequent blood samples.  To give IV fluids and blood products.  If there is difficulty placing a peripheral intravenous (PIV) catheter. If taken care of properly, a PICC can remain in place for several months. A PICC can also allow a person to go home from the hospital early. Medicine and PICC care can be managed at home by a family member or home health care team. WHAT PROBLEMS CAN HAPPEN WHEN I HAVE A PICC? Problems with a PICC can occasionally occur. These may include:  A blood clot (thrombus) forming in or at the tip of the PICC. This can cause the PICC to become clogged. A clot-dissolving medicine called tissue plasminogen activator (tPA) can be given through the PICC to help break up the clot.  Inflammation of the vein (phlebitis) in which the PICC is placed. Signs of inflammation may include redness, pain at the insertion site, red streaks, or being able to feel a "cord" in the vein where the PICC is located.  Infection in the PICC or at the insertion site. Signs of  infection may include fever, chills, redness, swelling, or pus drainage from the PICC insertion site.  PICC movement (malposition). The PICC tip may move from its original position due to excessive physical activity, forceful coughing, sneezing, or vomiting.  A break or cut in the PICC. It is important to not use scissors near the PICC.  Nerve or tendon irritation or injury during PICC insertion. WHAT SHOULD I KEEP IN MIND ABOUT ACTIVITIES WHEN I HAVE A PICC?  You may bend your arm and move it freely. If your PICC is near or at the bend of your elbow, avoid activity with repeated motion at the elbow.  Rest at home for the remainder of the day following PICC line insertion.  Avoid lifting heavy objects as instructed by your health care provider.  Avoid using a crutch with the arm on the same side as your PICC. You may need to use a walker. WHAT SHOULD I KNOW ABOUT MY PICC DRESSING?  Keep your PICC bandage (dressing) clean and dry to prevent infection.  Ask your health care provider when you may shower. Ask your health care provider to teach you how to wrap the PICC when you do take a shower.  Change the PICC dressing as instructed by your health care provider.  Change your PICC dressing if it becomes loose or wet. WHAT SHOULD I KNOW ABOUT PICC CARE?  Check the PICC insertion site daily for   leakage, redness, swelling, or pain.  Do not take a bath, swim, or use hot tubs when you have a PICC. Cover PICC line with clear plastic wrap and tape to keep it dry while showering.  Flush the PICC as directed by your health care provider. Let your health care provider know right away if the PICC is difficult to flush or does not flush. Do not use force to flush the PICC.  Do not use a syringe that is less than 10 mL to flush the PICC.  Never pull or tug on the PICC.  Avoid blood pressure checks on the arm with the PICC.  Keep your PICC identification card with you at all times.  Do not  take the PICC out yourself. Only a trained clinical professional should remove the PICC. SEEK IMMEDIATE MEDICAL CARE IF:  Your PICC is accidently pulled all the way out. If this happens, cover the insertion site with a bandage or gauze dressing. Do not throw the PICC away. Your health care provider will need to inspect it.  Your PICC was tugged or pulled and has partially come out. Do not  push the PICC back in.  There is any type of drainage, redness, or swelling where the PICC enters the skin.  You cannot flush the PICC, it is difficult to flush, or the PICC leaks around the insertion site when it is flushed.  You hear a "flushing" sound when the PICC is flushed.  You have pain, discomfort, or numbness in your arm, shoulder, or jaw on the same side as the PICC.  You feel your heart "racing" or skipping beats.  You notice a hole or tear in the PICC.  You develop chills or a fever. MAKE SURE YOU:   Understand these instructions.  Will watch your condition.  Will get help right away if you are not doing well or get worse. Document Released: 02/25/2003 Document Revised: 06/11/2013 Document Reviewed: 04/28/2013 [employer] Patient Information 2014 [employer].  

## 2013-11-30 NOTE — ED Notes (Signed)
Pt has LUE PICC line for antibiotic therapy r/t lymes disease notice soreness and redness this AM with accompanying pocket of purulent drainage. Sent to ED by her iv therapy nurse for evaluation

## 2013-11-30 NOTE — ED Provider Notes (Signed)
CSN: [id number]     Arrival date & time 11/30/13  2019 History   First MD Initiated Contact with Patient 11/30/13 2040    Scribed for Orpah Greek, *, the patient was seen in room MH04/MH04. This chart was scribed by Denice Bors, ED scribe. Patient's care was started at 8:42 PM  Chief Complaint  Patient presents with  . Arm Pain     (Consider location/radiation/quality/duration/timing/severity/associated sxs/prior Treatment) The history is provided by the patient. No language interpreter was used.   HPI Comments: [name] is a 43 y.o. female who presents to the Emergency Department with PMHx of Lyme's disease complaining of constant moderate left arm pain onset this morning. Describes pain as gradually improving. States left upper extremity PICC line has been replaced 6 weeks ago. States she was sent to ED by IV nurse for evaluation. Describes pain as soreness. Reports associated redness, and purulent drainage. Denies any aggravating or alleviating factors. Denies associated fever.     Past Medical History  Diagnosis Date  . Mononucleosis 5-10  . Ovarian cyst     Serous Cystadenofibroma-Left ovary  . Clostridium difficile infection 2014  . Mold contact confirmed     Toxic Mold syndrome  . Immune deficiency disorder   . Anxiety   . Asthma   . GERD (gastroesophageal reflux disease)   . Hypertension   . Thyroid disease   . PONV (postoperative nausea and vomiting)   . Arrhythmia     heart races when pt in pain  . Lyme disease   . Hypothyroidism   . Shortness of breath     on exertion  . Fibromyalgia    Past Surgical History  Procedure Laterality Date  . Tonsillectomy  2009  . Fecal transplant    . Groin cyst    . Nose cyst    . Pelvic laparoscopy      DL Ovarian cystectomy-Left  . Cholecystectomy    . Laparoscopy Right 06/12/2013    Procedure: LAPAROSCOPY OPERATIVE  fulgeration of endometriosis, right salpingooophorectomy;  Surgeon: Terrance Mass,  MD;  Location: [facility];  Service: Gynecology;  Laterality: Right;  . Oophorectomy Right 06/12/2013    Procedure: OOPHORECTOMY;POSS RIGHT [place];  Surgeon: Terrance Mass, MD;  Location: [facility];  Service: Gynecology;  Laterality: Right;   Family History  Problem Relation Age of Onset  . Hypertension Mother   . Diabetes Mother   . Breast cancer Maternal Aunt     Age 69  . Pancreatic cancer Maternal Grandmother   . Heart disease Maternal Grandfather   . Breast cancer Paternal Grandmother     Age 23   History  Substance Use Topics  . Smoking status: Never Smoker   . Smokeless tobacco: Never Used  . Alcohol Use: No   OB History   Grav Para Term Preterm Abortions TAB SAB Ect Mult Living   0              Review of Systems  Constitutional: Negative for fever.  Musculoskeletal: Positive for myalgias.  Psychiatric/Behavioral: Negative for confusion.      Allergies  Allegra; Penicillins; Doxycycline; Morphine; Oseltamivir phosphate; Other; Vancomycin; Diphenhydramine hcl; and Sulfonamide derivatives  Home Medications   Current Outpatient Rx  Name  Route  Sig  Dispense  Refill  . ALPRAZolam (XANAX) 1 MG tablet   Oral   Take 1 mg by mouth at bedtime as needed.         . bisoprolol (ZEBETA)  10 MG tablet   Oral   Take 10 mg by mouth daily.         . Cholecalciferol (VITAMIN D PO)   Oral   Take 1 tablet by mouth daily.          . citalopram (CELEXA) 40 MG tablet   Oral   Take 40 mg by mouth daily.         . cyclobenzaprine (FLEXERIL) 10 MG tablet   Oral   Take 10 mg by mouth at bedtime.         . fentaNYL (DURAGESIC - DOSED MCG/HR) 25 MCG/HR   Transdermal   Place 1 patch onto the skin every other day.          . fidaxomicin (DIFICID) 200 MG TABS tablet   Oral   Take 200 mg by mouth 2 (two) times daily. X 10 days, started 07/17/13.  For C-Dif         . HYOSCYAMINE PO   Oral   Take 0.125 mg by mouth daily.          .  norethindrone-ethinyl estradiol (JUNEL FE 1/20) 1-20 MG-MCG tablet      Take continuous and withdrawal every three months   3 Package   4   . pantoprazole (PROTONIX) 20 MG tablet   Oral   Take 20 mg by mouth daily.         . Probiotic Product (ALIGN PO)   Oral   Take by mouth as needed.         . prochlorperazine (COMPAZINE) 10 MG tablet   Oral   Take 10 mg by mouth every 6 (six) hours as needed.         . promethazine (PHENERGAN) 25 MG tablet   Oral   Take 25 mg by mouth every 6 (six) hours as needed.         Marland Kitchen scopolamine (TRANSDERM-SCOP) 1.5 MG   Transdermal   Place 1 patch onto the skin once.         . thyroid (ARMOUR) 30 MG tablet   Oral   Take 30 mg by mouth daily before breakfast.         . valACYclovir (VALTREX) 500 MG tablet   Oral   Take 500 mg by mouth 2 (two) times daily.         . vitamin B-12 (CYANOCOBALAMIN) 500 MCG tablet   Oral   Take 500 mcg by mouth daily.          BP 140/81  Pulse 73  Temp(Src) 97.9 F (36.6 C) (Oral)  Resp 18  SpO2 99%  LMP 09/11/2013 Physical Exam  Nursing note and vitals reviewed. Constitutional: She is oriented to person, place, and time. She appears well-developed and well-nourished. No distress.  HENT:  Head: Normocephalic and atraumatic.  Eyes: EOM are normal.  Neck: Neck supple. No tracheal deviation present.  Cardiovascular: Normal rate.   Pulmonary/Chest: Effort normal. No respiratory distress.  Musculoskeletal: Normal range of motion.  Neurological: She is alert and oriented to person, place, and time.  Skin: Skin is warm and dry.  PICC line present the left arm. Entry site in left upper arm has 1-2 mm of erythema without any drainage. No tenderness, fluctuance, induration, warmth.  Psychiatric: She has a normal mood and affect. Her behavior is normal.    ED Course  Procedures (including critical care time)  COORDINATION OF CARE:  Nursing notes reviewed. Vital signs reviewed. Initial pt  interview and examination performed.   8:53 PM-Discussed treatment plan with pt at bedside. Pt agrees with plan.   Treatment plan initiated:Medications - No data to display   Initial diagnostic testing ordered.     Labs Review Labs Reviewed - No data to display Imaging Review No results found.   EKG Interpretation None      MDM   Final diagnoses:  None    Patient presents to the ER for evaluation of possible PICC line problem. Patient has had this particular PICC line in for approximately 10 days. Earlier this morning there was pain around the site and there was some redness with the "pustule". She reports that the testicle popped and now the redness is gone and she feels much better.  Examination reveals slight erythema at the margins of the entry point. This is consistent with local irritation. There is no pus draining from the area, I cannot express any pus out of the tract. There is no overlying tenderness, swelling, fluctuance. At this point I have no concern for any soft tissue infection, and certainly no deeper infections that would require removal of the line.  This is followup with her doctor this week. She can use the line, but we'll watch her closely. If there is any recurrence of the increased swelling she saw earlier, drainage or increasing redness, return to the ER for worsening her doctor.  I personally performed the services described in this documentation, which was scribed in my presence. The recorded information has been reviewed and is accurate.     Orpah Greek, MD 11/30/13 2103

## 2014-01-24 ENCOUNTER — Other Ambulatory Visit: Payer: Self-pay | Admitting: Gynecology

## 2014-02-06 ENCOUNTER — Telehealth: Payer: Self-pay | Admitting: *Deleted

## 2014-02-06 MED ORDER — NORETHIN ACE-ETH ESTRAD-FE 1-20 MG-MCG PO TABS: ORAL_TABLET | ORAL | Status: DC

## 2014-02-06 NOTE — Telephone Encounter (Signed)
Pt has annual scheduled on 03/09/14 requesting refill on birth control,2 pack sent.

## 2014-03-09 ENCOUNTER — Other Ambulatory Visit (HOSPITAL_COMMUNITY)
Admission: RE | Admit: 2014-03-09 | Discharge: 2014-03-09 | Disposition: A | Discharge status: Home / Self Care | Payer: BC Managed Care – PPO | Source: Ambulatory Visit | Attending: Gynecology | Admitting: Gynecology

## 2014-03-09 ENCOUNTER — Encounter: Payer: Self-pay | Admitting: Gynecology

## 2014-03-09 ENCOUNTER — Ambulatory Visit (INDEPENDENT_AMBULATORY_CARE_PROVIDER_SITE_OTHER): Payer: BC Managed Care – PPO | Admitting: Gynecology

## 2014-03-09 VITALS — BP 126/80 | Ht 61.0 in | Wt 202.0 lb

## 2014-03-09 DIAGNOSIS — Z01419 Encounter for gynecological examination (general) (routine) without abnormal findings: Secondary | ICD-10-CM

## 2014-03-09 DIAGNOSIS — Z1151 Encounter for screening for human papillomavirus (HPV): Secondary | ICD-10-CM | POA: Insufficient documentation

## 2014-03-09 MED ORDER — NORETHIN ACE-ETH ESTRAD-FE 1-20 MG-MCG PO TABS: ORAL_TABLET | ORAL | Status: DC

## 2014-03-09 NOTE — Progress Notes (Signed)
[name] May 27, 1971 [id number]   History:    43 y.o.  for annual gyn exam who has been diagnosed with Lyme disease and as a result of the antibiotics that she was on she had developed C. difficile when she is currently under treatment. Her PCP is Dr. Metro Kung who has been doing her blood work. Patient is doing well on her Junel 1/20 oral contraceptive pills. Patient with no prior history of normal Pap smear. She has not had her mammogram yet.  On October of 2014 patient had a laparoscopic right salpingo-oophorectomy along with fulguration of endometriotic implants. Her pathology report and demonstrated benign serous cyst adenofibroma and a benign ovarian tissue with endometriosis, endosalpingosis. No atypia or malignancy. Benign fallopian tube and paratubal cyst with no atypia or malignancy was described. She has done well since her surgery.  Past medical history,surgical history, family history and social history were all reviewed and documented in the EPIC chart.  Gynecologic History No LMP recorded. Patient is not currently having periods (Reason: Other). Contraception: OCP (estrogen/progesterone) Last Pap: 2012. Results were: normal Last mammogram: No prior study. Results were: No prior study  Obstetric History OB History  Gravida Para Term Preterm AB SAB TAB Ectopic Multiple Living  0                  ROS: A ROS was performed and pertinent positives and negatives are included in the history.  GENERAL: No fevers or chills. HEENT: No change in vision, no earache, sore throat or sinus congestion. NECK: No pain or stiffness. CARDIOVASCULAR: No chest pain or pressure. No palpitations. PULMONARY: No shortness of breath, cough or wheeze. GASTROINTESTINAL: No abdominal pain, nausea, vomiting or diarrhea, melena or bright red blood per rectum. GENITOURINARY: No urinary frequency, urgency, hesitancy or dysuria. MUSCULOSKELETAL: No joint or muscle pain, no back pain, no recent trauma.  DERMATOLOGIC: No rash, no itching, no lesions. ENDOCRINE: No polyuria, polydipsia, no heat or cold intolerance. No recent change in weight. HEMATOLOGICAL: No anemia or easy bruising or bleeding. NEUROLOGIC: No headache, seizures, numbness, tingling or weakness. PSYCHIATRIC: No depression, no loss of interest in normal activity or change in sleep pattern.     Exam: chaperone present  BP 126/80  Ht 5\' 1"  (1.549 m)  Wt 202 lb (91.627 kg)  BMI 38.19 kg/m2  Body mass index is 38.19 kg/(m^2).  General appearance : Well developed well nourished female. No acute distress HEENT: Neck supple, trachea midline, no carotid bruits, no thyroidmegaly Lungs: Clear to auscultation, no rhonchi or wheezes, or rib retractions  Heart: Regular rate and rhythm, no murmurs or gallops Breast:Examined in sitting and supine position were symmetrical in appearance, no palpable masses or tenderness,  no skin retraction, no nipple inversion, no nipple discharge, no skin discoloration, no axillary or supraclavicular lymphadenopathy Abdomen: no palpable masses or tenderness, no rebound or guarding Extremities: no edema or skin discoloration or tenderness  Pelvic:  Bartholin, Urethra, Skene Glands: Within normal limits             Vagina: No gross lesions or discharge  Cervix: No gross lesions or discharge  Uterus  anteverted, normal size, shape and consistency, non-tender and mobile  Adnexa  Without masses or tenderness  Anus and perineum  normal   Rectovaginal  normal sphincter tone without palpated masses or tenderness             Hemoccult not indicated     Assessment/Plan:  43 y.o. female for annual  exam who is recovering from C. difficile as a result of antibiotic therapy for lines disease. Pap smear done today. Patient was reminded to schedule her mammogram. We discussed the importance of monthly self breast examination. Her PCP will be drawn her blood work. She will continue on her 20 mcg oral contraceptive  pill.  Note: This dictation was prepared with  Dragon/digital dictation along withSmart phrase technology. Any transcriptional errors that result from this process are unintentional.   Terrance Mass MD, 3:09 PM 03/09/2014

## 2014-03-11 LAB — CYTOLOGY - PAP

## 2014-07-02 ENCOUNTER — Encounter: Payer: Self-pay | Admitting: Internal Medicine

## 2014-07-02 ENCOUNTER — Ambulatory Visit (INDEPENDENT_AMBULATORY_CARE_PROVIDER_SITE_OTHER)
Admission: RE | Admit: 2014-07-02 | Discharge: 2014-07-02 | Disposition: A | Discharge status: Home / Self Care | Payer: BC Managed Care – PPO | Source: Ambulatory Visit | Attending: Internal Medicine | Admitting: Internal Medicine

## 2014-07-02 ENCOUNTER — Encounter (INDEPENDENT_AMBULATORY_CARE_PROVIDER_SITE_OTHER): Payer: Self-pay

## 2014-07-02 ENCOUNTER — Ambulatory Visit (INDEPENDENT_AMBULATORY_CARE_PROVIDER_SITE_OTHER): Payer: BC Managed Care – PPO | Admitting: Internal Medicine

## 2014-07-02 ENCOUNTER — Other Ambulatory Visit (INDEPENDENT_AMBULATORY_CARE_PROVIDER_SITE_OTHER): Payer: BC Managed Care – PPO

## 2014-07-02 VITALS — BP 122/74 | HR 80 | Ht 61.0 in | Wt 207.0 lb

## 2014-07-02 DIAGNOSIS — R0609 Other forms of dyspnea: Secondary | ICD-10-CM | POA: Insufficient documentation

## 2014-07-02 DIAGNOSIS — R0600 Dyspnea, unspecified: Secondary | ICD-10-CM

## 2014-07-02 LAB — CBC WITH DIFFERENTIAL/PLATELET
Basophils Absolute: 0.1 10*3/uL (ref 0.0–0.1)
Basophils Relative: 1 % (ref 0.0–3.0)
Eosinophils Absolute: 0.3 10*3/uL (ref 0.0–0.7)
Eosinophils Relative: 3.5 % (ref 0.0–5.0)
HCT: 40 % (ref 36.0–46.0)
Hemoglobin: 13.4 g/dL (ref 12.0–15.0)
Lymphocytes Relative: 34.8 % (ref 12.0–46.0)
Lymphs Abs: 3.3 10*3/uL (ref 0.7–4.0)
MCHC: 33.4 g/dL (ref 30.0–36.0)
MCV: 89 fl (ref 78.0–100.0)
Monocytes Absolute: 0.4 10*3/uL (ref 0.1–1.0)
Monocytes Relative: 4.7 % (ref 3.0–12.0)
Neutro Abs: 5.3 10*3/uL (ref 1.4–7.7)
Neutrophils Relative %: 56 % (ref 43.0–77.0)
Platelets: 289 10*3/uL (ref 150.0–400.0)
RBC: 4.49 Mil/uL (ref 3.87–5.11)
RDW: 12.9 % (ref 11.5–15.5)
WBC: 9.5 10*3/uL (ref 4.0–10.5)

## 2014-07-02 LAB — BASIC METABOLIC PANEL
BUN: 11 mg/dL (ref 6–23)
CO2: 27 mEq/L (ref 19–32)
Calcium: 8.6 mg/dL (ref 8.4–10.5)
Chloride: 104 mEq/L (ref 96–112)
Creatinine, Ser: 0.9 mg/dL (ref 0.4–1.2)
GFR: 75.97 mL/min (ref >60.00)
Glucose, Bld: 84 mg/dL (ref 70–99)
Potassium: 4 mEq/L (ref 3.5–5.1)
Sodium: 136 mEq/L (ref 135–145)

## 2014-07-02 LAB — BRAIN NATRIURETIC PEPTIDE: Pro B Natriuretic peptide (BNP): 40 pg/mL (ref 0.0–100.0)

## 2014-07-02 NOTE — Patient Instructions (Signed)
Please remember to go to the lab and x-ray department downstairs for your tests - we will call you with the results when they are available.  Weight control is simply a matter of calorie balance which needs to be tilted in your favor by eating less and exercising more.  To get the most out of exercise, you need to be continuously aware that you are short of breath, but never out of breath, for 30 minutes daily. As you improve, it will actually be easier for you to do the same amount of exercise  in  30 minutes so always push to the level where you are short of breath.  If this does not result in gradual weight reduction then I strongly recommend you see a nutritionist with a food diary x 2 weeks so that we can work out a negative calorie balance which is universally effective in steady weight loss programs.  Think of your calorie balance like you do your bank account where in this case you want the balance to go down so you must take in less calories than you burn up.  It's just that simple:  Hard to do, but easy to understand.  Good luck!

## 2014-07-02 NOTE — Assessment & Plan Note (Addendum)
-   07/02/2014  Walked RA x 3 laps @ 185 ft each stopped due to end of study  - spirometry 07/02/2014 > wnl   Except for min mid flows  Symptoms are markedly disproportionate to objective findings and not clear this is a lung problem but pt does appear to have difficult airway management issues. DDX of  difficult airways management all start with A and  include Adherence, Ace Inhibitors, Acid Reflux, Active Sinus Disease, Alpha 1 Antitripsin deficiency, Anxiety masquerading as Airways dz,  ABPA,  allergy(esp in young), Aspiration (esp in elderly), Adverse effects of DPI,  Active smokers, plus two Bs  = Bronchiectasis and Beta blocker use..and one C= CHF  Allergy/asthma:  Spirometry is c/w a h/o asthma (though doesn't explain present symptoms)and issue of role of mold cannot be elucidated at this point so would refer/defer to [facility] eval except to note she did not improve on symbicort despite reported improvement on saba which makes not sense pharmacologically given that foromoterol is equal to or more potent than albuterol in most studies. If dx remains in doubt the best study would be a methacholine challenge test  Anxiety is usually a dx of exclusion but near the top of the list based on the refractory "daily and constant" nature of her symptoms which could not be reproduced objectively during this visit  Pulmonary f/u is prn

## 2014-07-02 NOTE — Progress Notes (Signed)
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 first noted symptoms of sob around 2004 at wt 170 eval by Donneta Romberg initially with poor pfts with allergy to dust/ dogs/ per pt rec saba before and symbicort as maint but 0% better rec allergy shots did not agree to do them but continued the inhalers s benefit then abruptly worse in 2010 p tonsilectomy > dx gastroparesis/ viral hepatitis > referred to [facility] by Henrene Pastor > dx was bacterial overgrowth > fecal transplant at [facility] [place] sees with ongoing nausea and vomiting referred to pulmonary clinic 07/02/2014 by Workers comp   07/02/2014 1st [facility] office visit/ Wert   Chief Complaint  Patient presents with  . Pulmonary Consult    Worker's Comp Case. Pt c/o SOB since 2005. She states that she gets SOB with walking up stairs, walking fast or "any kind of exercise". 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   last known mold exposure 2010 Breathing a little better in cooler weather anything more than walking flat room to room causes sob, can't do one flight of steps  rx advair but did not use am of ov Uses saba a little better if uses right before ex Some cough with activity but not with meals and not noct- actually sleeps fine Last vomited sev months prior to OV     No obvious patterns in  day to day or daytime variabilty or excess or purulent sputum production  or cp or chest tightness, subjective wheeze overt sinus or hb symptoms. No unusual exp hx or h/o childhood pna/ asthma or knowledge of premature birth.  Sleeping ok without nocturnal  or early am exacerbation  of respiratory  c/o's or need for noct saba. Also denies any obvious fluctuation of symptoms with weather or environmental changes or other aggravating or alleviating factors except as outlined above   Current  Medications, Allergies, Complete Past Medical History, Past Surgical History, Family History, and Social History were reviewed in [employer] record.           Review of Systems  Constitutional: Negative for fever, chills and unexpected weight change.  HENT: Negative for congestion, dental problem, ear pain, nosebleeds, postnasal drip, rhinorrhea, sinus pressure, sneezing, sore throat, trouble swallowing and voice change.   Eyes: Negative for visual disturbance.  Respiratory: Positive for shortness of breath. Negative for cough and choking.   Cardiovascular: Negative for chest pain and leg swelling.  Gastrointestinal: Negative for vomiting, abdominal pain and diarrhea.  Genitourinary: Negative for difficulty urinating.  Musculoskeletal: Negative for arthralgias.  Skin: Negative for rash.  Neurological: Negative for tremors, syncope and headaches.  Hematological: Does not bruise/bleed easily.       Objective:   Physical Exam  Obese wf nad  Wt Readings from Last 3 Encounters:  07/02/14 207 lb (93.895 kg)  03/09/14 202 lb (91.627 kg)  09/09/13 199 lb 3.2 oz (90.357 kg)      HEENT: nl dentition, turbinates, and orophanx. Nl external ear canals without cough reflex   NECK :  without JVD/Nodes/TM/ nl carotid upstrokes bilaterally   LUNGS: no acc muscle use, clear to A and P bilaterally without cough on insp or exp maneuvers   CV:  RRR  no s3 or murmur or increase in  P2, no edema   ABD:  soft and nontender with nl excursion in the supine position. No bruits or organomegaly, bowel sounds nl  MS:  warm without deformities, calf tenderness, cyanosis or clubbing  SKIN: warm and dry without lesions    NEURO:  alert, approp, no deficits     CXR  07/02/2014 :  No active cardiopulmonary disease  Lab Results  Component Value Date   WBC 9.5 07/02/2014   HGB 13.4 07/02/2014   HCT 40.0 07/02/2014   MCV 89.0 07/02/2014   PLT 289.0 07/02/2014  No Eos       Assessment & Plan:

## 2014-07-03 DIAGNOSIS — E669 Obesity, unspecified: Secondary | ICD-10-CM | POA: Insufficient documentation

## 2014-07-03 LAB — D-DIMER, QUANTITATIVE: D-Dimer, Quant: 0.59 ug/mL-FEU — ABNORMAL HIGH (ref 0.00–0.48)

## 2014-07-03 NOTE — Progress Notes (Signed)
Quick Note:  ATC pt. VM box is full. WCB. ______

## 2014-07-03 NOTE — Assessment & Plan Note (Signed)
This is her main "pulmonary problem" and needs to be addressed by getting herself into consistent neg cal balance and avoiding repeat steroid exposure.

## 2014-07-03 NOTE — Progress Notes (Signed)
Quick Note:  Spoke with pt and notified of results per Dr. Wert. Pt verbalized understanding and denied any questions.  ______ 

## 2014-07-06 NOTE — Progress Notes (Signed)
Quick Note:  Spoke with pt and notified of results per Dr. Wert. Pt verbalized understanding and denied any questions.  ______ 

## 2014-12-03 ENCOUNTER — Encounter (HOSPITAL_BASED_OUTPATIENT_CLINIC_OR_DEPARTMENT_OTHER): Payer: Self-pay

## 2014-12-03 ENCOUNTER — Emergency Department (HOSPITAL_BASED_OUTPATIENT_CLINIC_OR_DEPARTMENT_OTHER)
Admission: EM | Admit: 2014-12-03 | Discharge: 2014-12-03 | Disposition: A | Discharge status: Home / Self Care | Payer: BC Managed Care – PPO | Attending: Emergency Medicine | Admitting: Emergency Medicine

## 2014-12-03 DIAGNOSIS — Z862 Personal history of diseases of the blood and blood-forming organs and certain disorders involving the immune mechanism: Secondary | ICD-10-CM | POA: Diagnosis not present

## 2014-12-03 DIAGNOSIS — Z880 Allergy status to penicillin: Secondary | ICD-10-CM | POA: Diagnosis not present

## 2014-12-03 DIAGNOSIS — H6091 Unspecified otitis externa, right ear: Secondary | ICD-10-CM

## 2014-12-03 DIAGNOSIS — I10 Essential (primary) hypertension: Secondary | ICD-10-CM | POA: Diagnosis not present

## 2014-12-03 DIAGNOSIS — Z792 Long term (current) use of antibiotics: Secondary | ICD-10-CM | POA: Insufficient documentation

## 2014-12-03 DIAGNOSIS — Z87448 Personal history of other diseases of urinary system: Secondary | ICD-10-CM | POA: Insufficient documentation

## 2014-12-03 DIAGNOSIS — E039 Hypothyroidism, unspecified: Secondary | ICD-10-CM | POA: Insufficient documentation

## 2014-12-03 DIAGNOSIS — H9201 Otalgia, right ear: Secondary | ICD-10-CM | POA: Diagnosis present

## 2014-12-03 DIAGNOSIS — Z8619 Personal history of other infectious and parasitic diseases: Secondary | ICD-10-CM | POA: Diagnosis not present

## 2014-12-03 DIAGNOSIS — F419 Anxiety disorder, unspecified: Secondary | ICD-10-CM | POA: Insufficient documentation

## 2014-12-03 DIAGNOSIS — Z79899 Other long term (current) drug therapy: Secondary | ICD-10-CM | POA: Diagnosis not present

## 2014-12-03 DIAGNOSIS — Z8739 Personal history of other diseases of the musculoskeletal system and connective tissue: Secondary | ICD-10-CM | POA: Diagnosis not present

## 2014-12-03 DIAGNOSIS — K219 Gastro-esophageal reflux disease without esophagitis: Secondary | ICD-10-CM | POA: Diagnosis not present

## 2014-12-03 DIAGNOSIS — J45909 Unspecified asthma, uncomplicated: Secondary | ICD-10-CM | POA: Insufficient documentation

## 2014-12-03 MED ORDER — OXYCODONE-ACETAMINOPHEN 5-325 MG PO TABS
1.0000 | ORAL_TABLET | Freq: Once | ORAL | Status: DC
  Filled 2014-12-03: qty 1

## 2014-12-03 MED ORDER — AMOXICILLIN 500 MG PO CAPS: 1000.0000 mg | ORAL_CAPSULE | Freq: Two times a day (BID) | ORAL | Status: DC

## 2014-12-03 MED ORDER — CIPROFLOXACIN-DEXAMETHASONE 0.3-0.1 % OT SUSP
4.0000 [drp] | Freq: Once | OTIC | Status: AC
  Administered 2014-12-03: 4 [drp] via OTIC
  Filled 2014-12-03: qty 7.5

## 2014-12-03 NOTE — ED Provider Notes (Signed)
CSN: [id number]     Arrival date & time 12/03/14  0821 History   First MD Initiated Contact with Patient 12/03/14 [id number]     Chief Complaint  Patient presents with  . Otalgia     (Consider location/radiation/quality/duration/timing/severity/associated sxs/prior Treatment) HPI  Pt presenting with right ear pain.  She states the pain has been present for the past 2 days.  She has tried cipro drops that she had from a prior ear infection but these have not been helping.  She thinks they may be expired.  She also notes that she has small ear canals.  Has been having some nasal congestion from allergies over the past week as well.  No fever/chills.  No vomiting or systemic symptoms.  No sore throat.  No pain behind ear.  There are no other associated systemic symptoms, there are no other alleviating or modifying factors.   Past Medical History  Diagnosis Date  . Mononucleosis 5-10  . Ovarian cyst     Serous Cystadenofibroma-Left ovary  . Clostridium difficile infection 2014  . Mold contact confirmed     Toxic Mold syndrome  . Immune deficiency disorder   . Anxiety   . Asthma   . GERD (gastroesophageal reflux disease)   . Hypertension   . Thyroid disease   . PONV (postoperative nausea and vomiting)   . Arrhythmia     heart races when pt in pain  . Lyme disease   . Hypothyroidism   . Shortness of breath     on exertion  . Fibromyalgia    Past Surgical History  Procedure Laterality Date  . Tonsillectomy  2009  . Fecal transplant    . Groin cyst    . Nose cyst    . Pelvic laparoscopy      DL Ovarian cystectomy-Left  . Cholecystectomy    . Laparoscopy Right 06/12/2013    Procedure: LAPAROSCOPY OPERATIVE  fulgeration of endometriosis, right salpingooophorectomy;  Surgeon: Terrance Mass, MD;  Location: [facility];  Service: Gynecology;  Laterality: Right;  . Oophorectomy Right 06/12/2013    Procedure: OOPHORECTOMY;POSS RIGHT Syl[place];  Surgeon: Terrance Mass, MD;   Location: [facility];  Service: Gynecology;  Laterality: Right;   Family History  Problem Relation Age of Onset  . Hypertension Mother   . Diabetes Mother   . Breast cancer Maternal Aunt     Age 31  . Pancreatic cancer Maternal Grandmother   . Heart disease Maternal Grandfather   . Breast cancer Paternal Grandmother     Age 78  . Asthma Paternal Grandmother    History  Substance Use Topics  . Smoking status: Never Smoker   . Smokeless tobacco: Never Used  . Alcohol Use: No   OB History    Gravida Para Term Preterm AB TAB SAB Ectopic Multiple Living   0              Review of Systems  ROS reviewed and all otherwise negative except for mentioned in HPI    Allergies  Allegra; Penicillins; Doxycycline; Morphine; Oseltamivir phosphate; Other; Vancomycin; Diphenhydramine hcl; and Sulfonamide derivatives  Home Medications   Prior to Admission medications   Medication Sig Start Date End Date Taking? Authorizing Provider  CefTRIAXone Sodium (ROCEPHIN IV) Inject into the vein 2 (two) times a week.   Yes Historical Provider, MD  albuterol (PROAIR HFA) 108 (90 BASE) MCG/ACT inhaler Inhale 2 puffs into the lungs every 6 (six) hours as needed for wheezing or shortness  of breath.    Historical Provider, MD  ALPRAZolam Duanne Moron) 1 MG tablet Take 1 mg by mouth at bedtime as needed.    Historical Provider, MD  amoxicillin (AMOXIL) 500 MG capsule Take 2 capsules (1,000 mg total) by mouth 2 (two) times daily. 12/03/14   Alfonzo Beers, MD  bisoprolol (ZEBETA) 10 MG tablet Take 10 mg by mouth daily.    Historical Provider, MD  Cholecalciferol (VITAMIN D PO) Take 1 tablet by mouth daily.     Historical Provider, MD  citalopram (CELEXA) 40 MG tablet Take 40 mg by mouth daily.    Historical Provider, MD  cyclobenzaprine (FLEXERIL) 10 MG tablet Take 10 mg by mouth at bedtime.    Historical Provider, MD  fentaNYL (DURAGESIC - DOSED MCG/HR) 25 MCG/HR Place 1 patch onto the skin every other day.      Historical Provider, MD  HYOSCYAMINE PO Take 0.125 mg by mouth daily.     Historical Provider, MD  norethindrone-ethinyl estradiol (JUNEL FE 1/20) 1-20 MG-MCG tablet Take continuous and withdrawal every three months 03/09/14   Terrance Mass, MD  Probiotic Product (ALIGN PO) Take by mouth as needed.    Historical Provider, MD  prochlorperazine (COMPAZINE) 10 MG tablet Take 10 mg by mouth every 6 (six) hours as needed.    Historical Provider, MD  promethazine (PHENERGAN) 25 MG tablet Take 25 mg by mouth every 6 (six) hours as needed.    Historical Provider, MD  thyroid (ARMOUR) 30 MG tablet Take 30 mg by mouth daily before breakfast.    Historical Provider, MD  valACYclovir (VALTREX) 500 MG tablet Take 500 mg by mouth 2 (two) times daily.    Historical Provider, MD  vitamin B-12 (CYANOCOBALAMIN) 500 MCG tablet Take 500 mcg by mouth daily.    Historical Provider, MD   BP 142/101 mmHg  Pulse 97  Temp(Src) 98.4 F (36.9 C) (Oral)  Resp 16  Ht 5\' 1"  (1.549 m)  Wt 207 lb (93.895 kg)  BMI 39.13 kg/m2  SpO2 99%  Vitals reviewed Physical Exam  Physical Examination: General appearance - alert, well appearing, and in no distress Mental status - alert, oriented to person, place, and time Eyes - no conjunctival injection, no scleral icterus Ears - right EAC with swelling and debris in canal, pain with motion of pinna, unable to visualize TM, no mastoid tenderness Mouth - mucous membranes moist, pharynx normal without lesions Neck - supple, tender reactive LAD in right anterior cervical region Chest - clear to auscultation, no wheezes, rales or rhonchi, symmetric air entry Heart - normal rate, regular rhythm, normal S1, S2, no murmurs, rubs, clicks or gallops Extremities - peripheral pulses normal, no pedal edema, no clubbing or cyanosis Skin - normal coloration and turgor, no rashes  ED Course  Procedures (including critical care time) Labs Review Labs Reviewed - No data to display  Imaging  Review No results found.   EKG Interpretation None      MDM   Final diagnoses:  Otitis externa of right ear    Pt with otitis externa of left ear- significant swelling of canal- placed ear wick to help deliver drops.  ciprodex drops.  Will also given amoxicillin po as cannot visualize right TM.   Patient is overall nontoxic and well hydrated in appearance.  Return to the ED with any concerns including vomiting, seizure activity, worsening pain, changes in mental status, or any other alarming symptoms    Alfonzo Beers, MD 12/04/14 1751

## 2014-12-03 NOTE — Discharge Instructions (Signed)
Return to the ED with any concerns including fever/chills, vomiting and not able to keep down liquids or antibiotics, pain behind ear, decreased level of alertness/lethargy, or any other alarming symptoms  You should use 4 drops of ciprodex in the right ear every 4 hours- leave the ear wick in until swelling goes down and/or treatment is complete

## 2014-12-03 NOTE — ED Notes (Signed)
Right ear x 2 days unrelieved after taking relafen and Cipro ear drops.

## 2015-03-17 ENCOUNTER — Other Ambulatory Visit: Payer: Self-pay | Admitting: Gynecology

## 2015-08-03 ENCOUNTER — Other Ambulatory Visit: Payer: Self-pay | Admitting: Gynecology

## 2015-08-03 ENCOUNTER — Telehealth: Payer: Self-pay

## 2015-08-03 MED ORDER — NORETHIN ACE-ETH ESTRAD-FE 1-20 MG-MCG PO TABS: ORAL_TABLET | ORAL | Status: DC

## 2015-08-03 NOTE — Telephone Encounter (Signed)
Patient called in voice mail requesting refill on Junel 1/20-FE birth control pill. She is overdue for CE with last one 03/19/2014 and has CE sched's 10/04/2015. Ok to give refills until 10/04/15 visit?

## 2015-08-03 NOTE — Telephone Encounter (Signed)
Three months supply and no refill until office visit.

## 2015-08-03 NOTE — Telephone Encounter (Signed)
Rx sent 

## 2015-10-04 ENCOUNTER — Ambulatory Visit (INDEPENDENT_AMBULATORY_CARE_PROVIDER_SITE_OTHER): Payer: BLUE CROSS/BLUE SHIELD | Admitting: Gynecology

## 2015-10-04 ENCOUNTER — Other Ambulatory Visit: Payer: Self-pay

## 2015-10-04 ENCOUNTER — Encounter: Payer: Self-pay | Admitting: Gynecology

## 2015-10-04 VITALS — BP 140/88 | Ht 61.0 in | Wt 200.0 lb

## 2015-10-04 DIAGNOSIS — Z1231 Encounter for screening mammogram for malignant neoplasm of breast: Secondary | ICD-10-CM

## 2015-10-04 DIAGNOSIS — Z01419 Encounter for gynecological examination (general) (routine) without abnormal findings: Secondary | ICD-10-CM

## 2015-10-04 MED ORDER — NORETHIN ACE-ETH ESTRAD-FE 1-20 MG-MCG PO TABS: ORAL_TABLET | ORAL | Status: DC

## 2015-10-04 NOTE — Progress Notes (Signed)
[name] [birth year] [id number]   History:    45 y.o.  for annual gyn exam with no complaints today.is Dr. Metro Kung who has been doing her blood work. Patient is doing well on her Junel 1/20 oral contraceptive pills. Patient with no prior history of normal Pap smear. She has not had her mammogram yet.  On October of 2014 patient had a laparoscopic right salpingo-oophorectomy along with fulguration of endometriotic implants. Her pathology report and demonstrated benign serous cyst adenofibroma and a benign ovarian tissue with endometriosis, endosalpingosis. No atypia or malignancy. Benign fallopian tube and paratubal cyst with no atypia or malignancy was described. She has done well since her surgery.  Past medical history,surgical history, family history and social history were all reviewed and documented in the EPIC chart.  Gynecologic History Patient's last menstrual period was 08/16/2015. Contraception: OCP (estrogen/progesterone) Last Pap: 2015. Results were: normal Last mammogram: No previous study. Results were: No previous study  Obstetric History OB History  Gravida Para Term Preterm AB SAB TAB Ectopic Multiple Living  0                  ROS: A ROS was performed and pertinent positives and negatives are included in the history.  GENERAL: No fevers or chills. HEENT: No change in vision, no earache, sore throat or sinus congestion. NECK: No pain or stiffness. CARDIOVASCULAR: No chest pain or pressure. No palpitations. PULMONARY: No shortness of breath, cough or wheeze. GASTROINTESTINAL: No abdominal pain, nausea, vomiting or diarrhea, melena or bright red blood per rectum. GENITOURINARY: No urinary frequency, urgency, hesitancy or dysuria. MUSCULOSKELETAL: No joint or muscle pain, no back pain, no recent trauma. DERMATOLOGIC: No rash, no itching, no lesions. ENDOCRINE: No polyuria, polydipsia, no heat or cold intolerance. No recent change in weight. HEMATOLOGICAL: No anemia or  easy bruising or bleeding. NEUROLOGIC: No headache, seizures, numbness, tingling or weakness. PSYCHIATRIC: No depression, no loss of interest in normal activity or change in sleep pattern.     Exam: chaperone present  BP 140/88 mmHg  Ht 5\' 1"  (1.549 m)  Wt 200 lb (90.719 kg)  BMI 37.81 kg/m2  LMP 08/16/2015  Body mass index is 37.81 kg/(m^2).  General appearance : Well developed well nourished female. No acute distress HEENT: Eyes: no retinal hemorrhage or exudates,  Neck supple, trachea midline, no carotid bruits, no thyroidmegaly Lungs: Clear to auscultation, no rhonchi or wheezes, or rib retractions  Heart: Regular rate and rhythm, no murmurs or gallops Breast:Examined in sitting and supine position were symmetrical in appearance, no palpable masses or tenderness,  no skin retraction, no nipple inversion, no nipple discharge, no skin discoloration, no axillary or supraclavicular lymphadenopathy Abdomen: no palpable masses or tenderness, no rebound or guarding Extremities: no edema or skin discoloration or tenderness  Pelvic:  Bartholin, Urethra, Skene Glands: Within normal limits             Vagina: No gross lesions or discharge  Cervix: No gross lesions or discharge  Uterus  anteverted, normal size, shape and consistency, non-tender and mobile  Adnexa  Without masses or tenderness  Anus and perineum  normal   Rectovaginal  normal sphincter tone without palpated masses or tenderness             Hemoccult not indicated     Assessment/Plan:  45 y.o. female for annual exam was provided with requisition to schedule her first baseline mammogram. We discussed importance of monthly breast exam. Prescription refill for Junel  1/20 oral contraceptive pill was provided. Pap smear not indicated this year. PCP doing her blood work. Vaccines up-to-date.   Terrance Mass MD, 3:08 PM 10/04/2015

## 2015-10-19 ENCOUNTER — Ambulatory Visit
Admission: RE | Admit: 2015-10-19 | Discharge: 2015-10-19 | Disposition: A | Discharge status: Home / Self Care | Payer: BC Managed Care – PPO | Source: Ambulatory Visit

## 2015-10-19 DIAGNOSIS — Z1231 Encounter for screening mammogram for malignant neoplasm of breast: Secondary | ICD-10-CM

## 2015-12-21 DIAGNOSIS — R1012 Left upper quadrant pain: Secondary | ICD-10-CM | POA: Diagnosis not present

## 2015-12-21 DIAGNOSIS — F4310 Post-traumatic stress disorder, unspecified: Secondary | ICD-10-CM | POA: Diagnosis not present

## 2015-12-21 DIAGNOSIS — G894 Chronic pain syndrome: Secondary | ICD-10-CM | POA: Diagnosis not present

## 2015-12-21 DIAGNOSIS — M542 Cervicalgia: Secondary | ICD-10-CM | POA: Diagnosis not present

## 2015-12-21 DIAGNOSIS — R1013 Epigastric pain: Secondary | ICD-10-CM | POA: Diagnosis not present

## 2015-12-21 DIAGNOSIS — F329 Major depressive disorder, single episode, unspecified: Secondary | ICD-10-CM | POA: Diagnosis not present

## 2015-12-28 DIAGNOSIS — E28310 Symptomatic premature menopause: Secondary | ICD-10-CM | POA: Diagnosis not present

## 2015-12-28 DIAGNOSIS — E039 Hypothyroidism, unspecified: Secondary | ICD-10-CM | POA: Diagnosis not present

## 2015-12-28 DIAGNOSIS — R5382 Chronic fatigue, unspecified: Secondary | ICD-10-CM | POA: Diagnosis not present

## 2015-12-28 DIAGNOSIS — D510 Vitamin B12 deficiency anemia due to intrinsic factor deficiency: Secondary | ICD-10-CM | POA: Diagnosis not present

## 2015-12-28 DIAGNOSIS — R799 Abnormal finding of blood chemistry, unspecified: Secondary | ICD-10-CM | POA: Diagnosis not present

## 2015-12-28 DIAGNOSIS — E559 Vitamin D deficiency, unspecified: Secondary | ICD-10-CM | POA: Diagnosis not present

## 2015-12-29 DIAGNOSIS — Z5181 Encounter for therapeutic drug level monitoring: Secondary | ICD-10-CM | POA: Diagnosis not present

## 2015-12-29 DIAGNOSIS — G894 Chronic pain syndrome: Secondary | ICD-10-CM | POA: Diagnosis not present

## 2015-12-29 DIAGNOSIS — Z79899 Other long term (current) drug therapy: Secondary | ICD-10-CM | POA: Diagnosis not present

## 2015-12-29 DIAGNOSIS — M542 Cervicalgia: Secondary | ICD-10-CM | POA: Diagnosis not present

## 2015-12-29 DIAGNOSIS — R1084 Generalized abdominal pain: Secondary | ICD-10-CM | POA: Diagnosis not present

## 2016-01-03 DIAGNOSIS — F4310 Post-traumatic stress disorder, unspecified: Secondary | ICD-10-CM | POA: Diagnosis not present

## 2016-01-03 DIAGNOSIS — G894 Chronic pain syndrome: Secondary | ICD-10-CM | POA: Diagnosis not present

## 2016-01-03 DIAGNOSIS — M4302 Spondylolysis, cervical region: Secondary | ICD-10-CM | POA: Diagnosis not present

## 2016-01-03 DIAGNOSIS — F329 Major depressive disorder, single episode, unspecified: Secondary | ICD-10-CM | POA: Diagnosis not present

## 2016-01-03 DIAGNOSIS — M5410 Radiculopathy, site unspecified: Secondary | ICD-10-CM | POA: Diagnosis not present

## 2016-01-03 DIAGNOSIS — M542 Cervicalgia: Secondary | ICD-10-CM | POA: Diagnosis not present

## 2016-01-06 DIAGNOSIS — M5410 Radiculopathy, site unspecified: Secondary | ICD-10-CM | POA: Diagnosis not present

## 2016-01-06 DIAGNOSIS — M4302 Spondylolysis, cervical region: Secondary | ICD-10-CM | POA: Diagnosis not present

## 2016-01-06 DIAGNOSIS — M542 Cervicalgia: Secondary | ICD-10-CM | POA: Diagnosis not present

## 2016-01-25 DIAGNOSIS — R1084 Generalized abdominal pain: Secondary | ICD-10-CM | POA: Diagnosis not present

## 2016-01-25 DIAGNOSIS — M542 Cervicalgia: Secondary | ICD-10-CM | POA: Diagnosis not present

## 2016-01-25 DIAGNOSIS — G894 Chronic pain syndrome: Secondary | ICD-10-CM | POA: Diagnosis not present

## 2016-02-02 DIAGNOSIS — M7541 Impingement syndrome of right shoulder: Secondary | ICD-10-CM | POA: Diagnosis not present

## 2016-02-16 DIAGNOSIS — M7541 Impingement syndrome of right shoulder: Secondary | ICD-10-CM | POA: Diagnosis not present

## 2016-02-17 ENCOUNTER — Other Ambulatory Visit: Payer: Self-pay | Admitting: Physician Assistant

## 2016-02-17 DIAGNOSIS — M25511 Pain in right shoulder: Secondary | ICD-10-CM

## 2016-02-24 ENCOUNTER — Other Ambulatory Visit: Payer: BC Managed Care – PPO

## 2016-02-25 ENCOUNTER — Ambulatory Visit
Admission: RE | Admit: 2016-02-25 | Discharge: 2016-02-25 | Disposition: A | Discharge status: Home / Self Care | Payer: BC Managed Care – PPO | Source: Ambulatory Visit | Attending: Physician Assistant | Admitting: Physician Assistant

## 2016-02-25 DIAGNOSIS — M25511 Pain in right shoulder: Secondary | ICD-10-CM

## 2016-02-25 DIAGNOSIS — M19011 Primary osteoarthritis, right shoulder: Secondary | ICD-10-CM | POA: Diagnosis not present

## 2016-03-08 DIAGNOSIS — N390 Urinary tract infection, site not specified: Secondary | ICD-10-CM | POA: Diagnosis not present

## 2016-03-08 DIAGNOSIS — M7541 Impingement syndrome of right shoulder: Secondary | ICD-10-CM | POA: Diagnosis not present

## 2016-03-09 DIAGNOSIS — R197 Diarrhea, unspecified: Secondary | ICD-10-CM | POA: Diagnosis not present

## 2016-03-15 DIAGNOSIS — M25511 Pain in right shoulder: Secondary | ICD-10-CM | POA: Diagnosis not present

## 2016-03-20 DIAGNOSIS — M25511 Pain in right shoulder: Secondary | ICD-10-CM | POA: Diagnosis not present

## 2016-03-21 DIAGNOSIS — R1084 Generalized abdominal pain: Secondary | ICD-10-CM | POA: Diagnosis not present

## 2016-03-21 DIAGNOSIS — Z5181 Encounter for therapeutic drug level monitoring: Secondary | ICD-10-CM | POA: Diagnosis not present

## 2016-03-21 DIAGNOSIS — M542 Cervicalgia: Secondary | ICD-10-CM | POA: Diagnosis not present

## 2016-03-21 DIAGNOSIS — G894 Chronic pain syndrome: Secondary | ICD-10-CM | POA: Diagnosis not present

## 2016-03-21 DIAGNOSIS — Z79899 Other long term (current) drug therapy: Secondary | ICD-10-CM | POA: Diagnosis not present

## 2016-03-22 DIAGNOSIS — M25511 Pain in right shoulder: Secondary | ICD-10-CM | POA: Diagnosis not present

## 2016-03-27 DIAGNOSIS — M25511 Pain in right shoulder: Secondary | ICD-10-CM | POA: Diagnosis not present

## 2016-03-29 DIAGNOSIS — M25511 Pain in right shoulder: Secondary | ICD-10-CM | POA: Diagnosis not present

## 2016-04-04 DIAGNOSIS — M25511 Pain in right shoulder: Secondary | ICD-10-CM | POA: Diagnosis not present

## 2016-04-05 DIAGNOSIS — M7541 Impingement syndrome of right shoulder: Secondary | ICD-10-CM | POA: Diagnosis not present

## 2016-04-07 DIAGNOSIS — M25511 Pain in right shoulder: Secondary | ICD-10-CM | POA: Diagnosis not present

## 2016-04-12 DIAGNOSIS — M25511 Pain in right shoulder: Secondary | ICD-10-CM | POA: Diagnosis not present

## 2016-04-17 DIAGNOSIS — M25511 Pain in right shoulder: Secondary | ICD-10-CM | POA: Diagnosis not present

## 2016-04-19 DIAGNOSIS — M25511 Pain in right shoulder: Secondary | ICD-10-CM | POA: Diagnosis not present

## 2016-04-25 DIAGNOSIS — M25511 Pain in right shoulder: Secondary | ICD-10-CM | POA: Diagnosis not present

## 2016-04-26 DIAGNOSIS — M25511 Pain in right shoulder: Secondary | ICD-10-CM | POA: Diagnosis not present

## 2016-05-03 DIAGNOSIS — M25511 Pain in right shoulder: Secondary | ICD-10-CM | POA: Diagnosis not present

## 2016-05-10 DIAGNOSIS — G894 Chronic pain syndrome: Secondary | ICD-10-CM | POA: Diagnosis not present

## 2016-05-10 DIAGNOSIS — M542 Cervicalgia: Secondary | ICD-10-CM | POA: Diagnosis not present

## 2016-05-10 DIAGNOSIS — R1084 Generalized abdominal pain: Secondary | ICD-10-CM | POA: Diagnosis not present

## 2016-05-10 DIAGNOSIS — Z79899 Other long term (current) drug therapy: Secondary | ICD-10-CM | POA: Diagnosis not present

## 2016-05-10 DIAGNOSIS — Z5181 Encounter for therapeutic drug level monitoring: Secondary | ICD-10-CM | POA: Diagnosis not present

## 2016-05-17 DIAGNOSIS — M25511 Pain in right shoulder: Secondary | ICD-10-CM | POA: Diagnosis not present

## 2016-05-22 DIAGNOSIS — R140 Abdominal distension (gaseous): Secondary | ICD-10-CM | POA: Diagnosis not present

## 2016-05-22 DIAGNOSIS — R109 Unspecified abdominal pain: Secondary | ICD-10-CM | POA: Diagnosis not present

## 2016-05-22 DIAGNOSIS — R197 Diarrhea, unspecified: Secondary | ICD-10-CM | POA: Diagnosis not present

## 2016-05-23 DIAGNOSIS — R197 Diarrhea, unspecified: Secondary | ICD-10-CM | POA: Diagnosis not present

## 2016-05-25 DIAGNOSIS — M7541 Impingement syndrome of right shoulder: Secondary | ICD-10-CM | POA: Diagnosis not present

## 2016-06-20 DIAGNOSIS — R933 Abnormal findings on diagnostic imaging of other parts of digestive tract: Secondary | ICD-10-CM | POA: Diagnosis not present

## 2016-06-20 DIAGNOSIS — R140 Abdominal distension (gaseous): Secondary | ICD-10-CM | POA: Diagnosis not present

## 2016-06-20 DIAGNOSIS — R197 Diarrhea, unspecified: Secondary | ICD-10-CM | POA: Diagnosis not present

## 2016-06-20 DIAGNOSIS — A049 Bacterial intestinal infection, unspecified: Secondary | ICD-10-CM | POA: Diagnosis not present

## 2016-07-05 DIAGNOSIS — R1084 Generalized abdominal pain: Secondary | ICD-10-CM | POA: Diagnosis not present

## 2016-07-05 DIAGNOSIS — G894 Chronic pain syndrome: Secondary | ICD-10-CM | POA: Diagnosis not present

## 2016-07-19 DIAGNOSIS — K6389 Other specified diseases of intestine: Secondary | ICD-10-CM | POA: Diagnosis not present

## 2016-07-19 DIAGNOSIS — R110 Nausea: Secondary | ICD-10-CM | POA: Diagnosis not present

## 2016-07-25 DIAGNOSIS — E559 Vitamin D deficiency, unspecified: Secondary | ICD-10-CM | POA: Diagnosis not present

## 2016-07-25 DIAGNOSIS — R5382 Chronic fatigue, unspecified: Secondary | ICD-10-CM | POA: Diagnosis not present

## 2016-07-25 DIAGNOSIS — E039 Hypothyroidism, unspecified: Secondary | ICD-10-CM | POA: Diagnosis not present

## 2016-07-25 DIAGNOSIS — R799 Abnormal finding of blood chemistry, unspecified: Secondary | ICD-10-CM | POA: Diagnosis not present

## 2016-07-25 DIAGNOSIS — D510 Vitamin B12 deficiency anemia due to intrinsic factor deficiency: Secondary | ICD-10-CM | POA: Diagnosis not present

## 2016-07-25 DIAGNOSIS — Z780 Asymptomatic menopausal state: Secondary | ICD-10-CM | POA: Diagnosis not present

## 2016-07-25 DIAGNOSIS — E569 Vitamin deficiency, unspecified: Secondary | ICD-10-CM | POA: Diagnosis not present

## 2016-08-23 DIAGNOSIS — G894 Chronic pain syndrome: Secondary | ICD-10-CM | POA: Diagnosis not present

## 2016-08-23 DIAGNOSIS — Z5181 Encounter for therapeutic drug level monitoring: Secondary | ICD-10-CM | POA: Diagnosis not present

## 2016-08-23 DIAGNOSIS — Z79899 Other long term (current) drug therapy: Secondary | ICD-10-CM | POA: Diagnosis not present

## 2016-08-23 DIAGNOSIS — R1084 Generalized abdominal pain: Secondary | ICD-10-CM | POA: Diagnosis not present

## 2016-08-23 DIAGNOSIS — M542 Cervicalgia: Secondary | ICD-10-CM | POA: Diagnosis not present

## 2016-09-08 ENCOUNTER — Other Ambulatory Visit: Payer: Self-pay | Admitting: Gynecology

## 2016-10-06 ENCOUNTER — Other Ambulatory Visit: Payer: Self-pay | Admitting: Gynecology

## 2016-10-11 ENCOUNTER — Encounter: Payer: BLUE CROSS/BLUE SHIELD | Admitting: Gynecology

## 2016-10-17 DIAGNOSIS — R1084 Generalized abdominal pain: Secondary | ICD-10-CM | POA: Diagnosis not present

## 2016-10-17 DIAGNOSIS — M542 Cervicalgia: Secondary | ICD-10-CM | POA: Diagnosis not present

## 2016-10-17 DIAGNOSIS — G894 Chronic pain syndrome: Secondary | ICD-10-CM | POA: Diagnosis not present

## 2016-10-24 DIAGNOSIS — H04123 Dry eye syndrome of bilateral lacrimal glands: Secondary | ICD-10-CM | POA: Diagnosis not present

## 2016-10-31 ENCOUNTER — Encounter: Payer: BLUE CROSS/BLUE SHIELD | Admitting: Gynecology

## 2016-11-01 DIAGNOSIS — M542 Cervicalgia: Secondary | ICD-10-CM | POA: Diagnosis not present

## 2016-11-01 DIAGNOSIS — G894 Chronic pain syndrome: Secondary | ICD-10-CM | POA: Diagnosis not present

## 2016-11-01 DIAGNOSIS — R1084 Generalized abdominal pain: Secondary | ICD-10-CM | POA: Diagnosis not present

## 2016-11-07 DIAGNOSIS — R1084 Generalized abdominal pain: Secondary | ICD-10-CM | POA: Diagnosis not present

## 2016-11-07 DIAGNOSIS — G894 Chronic pain syndrome: Secondary | ICD-10-CM | POA: Diagnosis not present

## 2016-11-08 DIAGNOSIS — G2581 Restless legs syndrome: Secondary | ICD-10-CM | POA: Diagnosis not present

## 2016-11-13 ENCOUNTER — Encounter: Payer: BLUE CROSS/BLUE SHIELD | Admitting: Gynecology

## 2016-11-23 ENCOUNTER — Encounter: Payer: BLUE CROSS/BLUE SHIELD | Admitting: Gynecology

## 2016-11-29 DIAGNOSIS — Z79899 Other long term (current) drug therapy: Secondary | ICD-10-CM | POA: Diagnosis not present

## 2016-11-29 DIAGNOSIS — R1084 Generalized abdominal pain: Secondary | ICD-10-CM | POA: Diagnosis not present

## 2016-11-29 DIAGNOSIS — M542 Cervicalgia: Secondary | ICD-10-CM | POA: Diagnosis not present

## 2016-11-29 DIAGNOSIS — Z5181 Encounter for therapeutic drug level monitoring: Secondary | ICD-10-CM | POA: Diagnosis not present

## 2016-11-29 DIAGNOSIS — G894 Chronic pain syndrome: Secondary | ICD-10-CM | POA: Diagnosis not present

## 2016-11-30 ENCOUNTER — Encounter: Payer: Self-pay | Admitting: Gynecology

## 2016-11-30 ENCOUNTER — Ambulatory Visit (INDEPENDENT_AMBULATORY_CARE_PROVIDER_SITE_OTHER): Payer: BLUE CROSS/BLUE SHIELD | Admitting: Gynecology

## 2016-11-30 VITALS — BP 120/76 | Ht 61.0 in | Wt 199.0 lb

## 2016-11-30 DIAGNOSIS — Z01419 Encounter for gynecological examination (general) (routine) without abnormal findings: Secondary | ICD-10-CM

## 2016-11-30 DIAGNOSIS — Z0000 Encounter for general adult medical examination without abnormal findings: Secondary | ICD-10-CM | POA: Diagnosis not present

## 2016-11-30 DIAGNOSIS — Z8742 Personal history of other diseases of the female genital tract: Secondary | ICD-10-CM | POA: Diagnosis not present

## 2016-11-30 DIAGNOSIS — Z8619 Personal history of other infectious and parasitic diseases: Secondary | ICD-10-CM | POA: Diagnosis not present

## 2016-11-30 MED ORDER — NORETHIN ACE-ETH ESTRAD-FE 1-20 MG-MCG PO TABS: 1.0000 | ORAL_TABLET | Freq: Every day | ORAL | 4 refills | Status: DC

## 2016-11-30 NOTE — Addendum Note (Signed)
Addended by: Nelva Nay on: 11/30/2016 03:58 PM   Modules accepted: Orders

## 2016-11-30 NOTE — Progress Notes (Addendum)
[name] 01-20-1971 [id number]   History:    46 y.o.  for annual gyn exam with no complaints today. Patient's never been sexually active. Her PCP is Dr.Phfar who has been doing her blood work. Patient is doing well on her Junel 1/20 oral contraceptive pills. Patient with no prior history of normal Pap smear. She has not had her mammogram yet.  On October of 2014 patient had a laparoscopic right salpingo-oophorectomy along with fulguration of endometriotic implants. Her pathology report and demonstrated benign serous cyst adenofibroma and a benign ovarian tissue with endometriosis, endosalpingosis. No atypia or malignancy. Benign fallopian tube and paratubal cyst with no atypia or malignancy was described. She has done well since her surgery.  Past medical history,surgical history, family history and social history were all reviewed and documented in the EPIC chart.  Gynecologic History No LMP recorded. Patient is not currently having periods (Reason: Oral contraceptives). Contraception: OCP (estrogen/progesterone) Last Pap: 2015. Results were: normal Last mammogram: 2017. Results were: normal  Obstetric History OB History  Gravida Para Term Preterm AB Living  0            SAB TAB Ectopic Multiple Live Births                    ROS: A ROS was performed and pertinent positives and negatives are included in the history.  GENERAL: No fevers or chills. HEENT: No change in vision, no earache, sore throat or sinus congestion. NECK: No pain or stiffness. CARDIOVASCULAR: No chest pain or pressure. No palpitations. PULMONARY: No shortness of breath, cough or wheeze. GASTROINTESTINAL: No abdominal pain, nausea, vomiting or diarrhea, melena or bright red blood per rectum. GENITOURINARY: No urinary frequency, urgency, hesitancy or dysuria. MUSCULOSKELETAL: No joint or muscle pain, no back pain, no recent trauma. DERMATOLOGIC: No rash, no itching, no lesions. ENDOCRINE: No polyuria,  polydipsia, no heat or cold intolerance. No recent change in weight. HEMATOLOGICAL: No anemia or easy bruising or bleeding. NEUROLOGIC: No headache, seizures, numbness, tingling or weakness. PSYCHIATRIC: No depression, no loss of interest in normal activity or change in sleep pattern.     Exam: chaperone present  BP 120/76   Ht 5\' 1"  (1.549 m)   Wt 199 lb (90.3 kg)   BMI 37.60 kg/m   Body mass index is 37.6 kg/m.  General appearance : Well developed well nourished female. No acute distress HEENT: Eyes: no retinal hemorrhage or exudates,  Neck supple, trachea midline, no carotid bruits, no thyroidmegaly Lungs: Clear to auscultation, no rhonchi or wheezes, or rib retractions  Heart: Regular rate and rhythm, no murmurs or gallops Breast:Examined in sitting and supine position were symmetrical in appearance, no palpable masses or tenderness,  no skin retraction, no nipple inversion, no nipple discharge, no skin discoloration, no axillary or supraclavicular lymphadenopathy Abdomen: no palpable masses or tenderness, no rebound or guarding Extremities: no edema or skin discoloration or tenderness  Pelvic:  Bartholin, Urethra, Skene Glands: Within normal limits             Vagina: No gross lesions or discharge, narrow, virginal  Cervix: No gross lesions or discharge  Uterus  anteverted, normal size, shape and consistency, non-tender and mobile  Adnexa  Without masses or tenderness  Anus and perineum  normal   Rectovaginal  normal sphincter tone without palpated masses or tenderness             Hemoccult not indicated     Assessment/Plan:  46  y.o. female for annual exam doing well with no complaints. Patient's Pap smear was done today. Prescription refill for oral contraceptive pill provided. PCP is been doing her blood. Patient requesting flu vaccine. PCP has been doing her blood work. Requisition to schedule her mammogram was provided. We discussed importance of monthly breast  exam. Patient's primary care physician started her on Macrobid one day ago for UTI that she has dysuria and bladder spasm saw I'm going to call her and Pyridium 200 mg one by mouth 3 times a day for 3 days. Patient also states that when she is on antibiotic she develops a yeast infection and wanted a prescription for Diflucan iron 150 mg one by mouth which was prescribed today as well.  Terrance Mass MD, 3:47 PM 11/30/2016

## 2016-12-05 DIAGNOSIS — N390 Urinary tract infection, site not specified: Secondary | ICD-10-CM | POA: Diagnosis not present

## 2016-12-05 LAB — PAP IG W/ RFLX HPV ASCU

## 2016-12-07 DIAGNOSIS — Z0001 Encounter for general adult medical examination with abnormal findings: Secondary | ICD-10-CM | POA: Diagnosis not present

## 2016-12-11 DIAGNOSIS — R3915 Urgency of urination: Secondary | ICD-10-CM | POA: Diagnosis not present

## 2016-12-11 DIAGNOSIS — R300 Dysuria: Secondary | ICD-10-CM | POA: Diagnosis not present

## 2016-12-15 DIAGNOSIS — R350 Frequency of micturition: Secondary | ICD-10-CM | POA: Diagnosis not present

## 2016-12-15 DIAGNOSIS — R3989 Other symptoms and signs involving the genitourinary system: Secondary | ICD-10-CM | POA: Diagnosis not present

## 2016-12-15 DIAGNOSIS — R3915 Urgency of urination: Secondary | ICD-10-CM | POA: Diagnosis not present

## 2016-12-19 DIAGNOSIS — Z888 Allergy status to other drugs, medicaments and biological substances status: Secondary | ICD-10-CM | POA: Diagnosis not present

## 2016-12-19 DIAGNOSIS — Z881 Allergy status to other antibiotic agents status: Secondary | ICD-10-CM | POA: Diagnosis not present

## 2016-12-19 DIAGNOSIS — Z880 Allergy status to penicillin: Secondary | ICD-10-CM | POA: Diagnosis not present

## 2016-12-19 DIAGNOSIS — N3010 Interstitial cystitis (chronic) without hematuria: Secondary | ICD-10-CM | POA: Diagnosis not present

## 2016-12-19 DIAGNOSIS — G510 Bell's palsy: Secondary | ICD-10-CM | POA: Diagnosis not present

## 2016-12-19 DIAGNOSIS — G894 Chronic pain syndrome: Secondary | ICD-10-CM | POA: Diagnosis not present

## 2016-12-19 DIAGNOSIS — R3989 Other symptoms and signs involving the genitourinary system: Secondary | ICD-10-CM | POA: Diagnosis not present

## 2016-12-19 DIAGNOSIS — F419 Anxiety disorder, unspecified: Secondary | ICD-10-CM | POA: Diagnosis not present

## 2016-12-19 DIAGNOSIS — A6923 Arthritis due to Lyme disease: Secondary | ICD-10-CM | POA: Diagnosis not present

## 2016-12-19 DIAGNOSIS — Z882 Allergy status to sulfonamides status: Secondary | ICD-10-CM | POA: Diagnosis not present

## 2016-12-19 DIAGNOSIS — Z791 Long term (current) use of non-steroidal anti-inflammatories (NSAID): Secondary | ICD-10-CM | POA: Diagnosis not present

## 2016-12-19 DIAGNOSIS — Z79899 Other long term (current) drug therapy: Secondary | ICD-10-CM | POA: Diagnosis not present

## 2016-12-19 DIAGNOSIS — Z91018 Allergy to other foods: Secondary | ICD-10-CM | POA: Diagnosis not present

## 2016-12-19 DIAGNOSIS — Z885 Allergy status to narcotic agent status: Secondary | ICD-10-CM | POA: Diagnosis not present

## 2016-12-19 DIAGNOSIS — M545 Low back pain: Secondary | ICD-10-CM | POA: Diagnosis not present

## 2016-12-25 DIAGNOSIS — N3010 Interstitial cystitis (chronic) without hematuria: Secondary | ICD-10-CM | POA: Diagnosis not present

## 2016-12-25 DIAGNOSIS — B373 Candidiasis of vulva and vagina: Secondary | ICD-10-CM | POA: Diagnosis not present

## 2016-12-27 DIAGNOSIS — R1084 Generalized abdominal pain: Secondary | ICD-10-CM | POA: Diagnosis not present

## 2016-12-27 DIAGNOSIS — G894 Chronic pain syndrome: Secondary | ICD-10-CM | POA: Diagnosis not present

## 2016-12-27 DIAGNOSIS — M542 Cervicalgia: Secondary | ICD-10-CM | POA: Diagnosis not present

## 2017-01-17 ENCOUNTER — Encounter: Payer: Self-pay | Admitting: Gynecology

## 2017-01-23 DIAGNOSIS — R030 Elevated blood-pressure reading, without diagnosis of hypertension: Secondary | ICD-10-CM | POA: Diagnosis not present

## 2017-01-23 DIAGNOSIS — Z832 Family history of diseases of the blood and blood-forming organs and certain disorders involving the immune mechanism: Secondary | ICD-10-CM | POA: Diagnosis not present

## 2017-01-24 DIAGNOSIS — G894 Chronic pain syndrome: Secondary | ICD-10-CM | POA: Diagnosis not present

## 2017-01-24 DIAGNOSIS — M542 Cervicalgia: Secondary | ICD-10-CM | POA: Diagnosis not present

## 2017-01-24 DIAGNOSIS — R1084 Generalized abdominal pain: Secondary | ICD-10-CM | POA: Diagnosis not present

## 2017-01-31 DIAGNOSIS — Z832 Family history of diseases of the blood and blood-forming organs and certain disorders involving the immune mechanism: Secondary | ICD-10-CM | POA: Diagnosis not present

## 2017-01-31 DIAGNOSIS — Z0000 Encounter for general adult medical examination without abnormal findings: Secondary | ICD-10-CM | POA: Diagnosis not present

## 2017-02-14 DIAGNOSIS — G894 Chronic pain syndrome: Secondary | ICD-10-CM | POA: Diagnosis not present

## 2017-02-14 DIAGNOSIS — R1084 Generalized abdominal pain: Secondary | ICD-10-CM | POA: Diagnosis not present

## 2017-02-14 DIAGNOSIS — M542 Cervicalgia: Secondary | ICD-10-CM | POA: Diagnosis not present

## 2017-02-26 DIAGNOSIS — Z79899 Other long term (current) drug therapy: Secondary | ICD-10-CM | POA: Diagnosis not present

## 2017-02-26 DIAGNOSIS — M542 Cervicalgia: Secondary | ICD-10-CM | POA: Diagnosis not present

## 2017-02-26 DIAGNOSIS — Z5181 Encounter for therapeutic drug level monitoring: Secondary | ICD-10-CM | POA: Diagnosis not present

## 2017-02-26 DIAGNOSIS — R1084 Generalized abdominal pain: Secondary | ICD-10-CM | POA: Diagnosis not present

## 2017-02-26 DIAGNOSIS — G894 Chronic pain syndrome: Secondary | ICD-10-CM | POA: Diagnosis not present

## 2017-03-13 DIAGNOSIS — Z79899 Other long term (current) drug therapy: Secondary | ICD-10-CM | POA: Diagnosis not present

## 2017-03-13 DIAGNOSIS — G894 Chronic pain syndrome: Secondary | ICD-10-CM | POA: Diagnosis not present

## 2017-03-13 DIAGNOSIS — R1084 Generalized abdominal pain: Secondary | ICD-10-CM | POA: Diagnosis not present

## 2017-03-13 DIAGNOSIS — M542 Cervicalgia: Secondary | ICD-10-CM | POA: Diagnosis not present

## 2017-03-13 DIAGNOSIS — Z5181 Encounter for therapeutic drug level monitoring: Secondary | ICD-10-CM | POA: Diagnosis not present

## 2017-03-27 DIAGNOSIS — G894 Chronic pain syndrome: Secondary | ICD-10-CM | POA: Diagnosis not present

## 2017-03-27 DIAGNOSIS — M542 Cervicalgia: Secondary | ICD-10-CM | POA: Diagnosis not present

## 2017-03-27 DIAGNOSIS — R1084 Generalized abdominal pain: Secondary | ICD-10-CM | POA: Diagnosis not present

## 2017-03-29 ENCOUNTER — Telehealth: Payer: Self-pay | Admitting: Cardiovascular Disease

## 2017-03-29 DIAGNOSIS — I10 Essential (primary) hypertension: Secondary | ICD-10-CM | POA: Diagnosis not present

## 2017-03-29 DIAGNOSIS — R011 Cardiac murmur, unspecified: Secondary | ICD-10-CM | POA: Diagnosis not present

## 2017-03-29 NOTE — Telephone Encounter (Signed)
Received records from [facility] for appointment on 04/27/17 with Dr Sallyanne Kuster.  Records put with Dr Croitoru's schedule for 04/27/17. lp

## 2017-04-23 DIAGNOSIS — G894 Chronic pain syndrome: Secondary | ICD-10-CM | POA: Diagnosis not present

## 2017-04-23 DIAGNOSIS — R1084 Generalized abdominal pain: Secondary | ICD-10-CM | POA: Diagnosis not present

## 2017-04-23 DIAGNOSIS — M542 Cervicalgia: Secondary | ICD-10-CM | POA: Diagnosis not present

## 2017-04-24 DIAGNOSIS — N3010 Interstitial cystitis (chronic) without hematuria: Secondary | ICD-10-CM | POA: Diagnosis not present

## 2017-04-27 ENCOUNTER — Ambulatory Visit (INDEPENDENT_AMBULATORY_CARE_PROVIDER_SITE_OTHER): Payer: BLUE CROSS/BLUE SHIELD | Admitting: Cardiovascular Disease

## 2017-04-27 ENCOUNTER — Encounter (INDEPENDENT_AMBULATORY_CARE_PROVIDER_SITE_OTHER): Payer: Self-pay

## 2017-04-27 ENCOUNTER — Encounter: Payer: Self-pay | Admitting: Cardiovascular Disease

## 2017-04-27 VITALS — BP 140/90 | Ht 61.0 in | Wt 192.0 lb

## 2017-04-27 DIAGNOSIS — Z6835 Body mass index (BMI) 35.0-35.9, adult: Secondary | ICD-10-CM | POA: Diagnosis not present

## 2017-04-27 DIAGNOSIS — I10 Essential (primary) hypertension: Secondary | ICD-10-CM

## 2017-04-27 DIAGNOSIS — R011 Cardiac murmur, unspecified: Secondary | ICD-10-CM | POA: Diagnosis not present

## 2017-04-27 DIAGNOSIS — E782 Mixed hyperlipidemia: Secondary | ICD-10-CM | POA: Diagnosis not present

## 2017-04-27 MED ORDER — AMLODIPINE BESYLATE 2.5 MG PO TABS: 2.5000 mg | ORAL_TABLET | Freq: Every day | ORAL | 3 refills | Status: DC

## 2017-04-27 NOTE — Patient Instructions (Addendum)
Dr Sallyanne Kuster has recommended making the following medication changes: 1. START Amlodipine 2.5 mg - take 1 tablet by mouth daily  Your physician has requested that you have an echocardiogram. Echocardiography is a painless test that uses sound waves to create images of your heart. It provides your doctor with information about the size and shape of your heart and how well your heart's chambers and valves are working. This procedure takes approximately one hour. There are no restrictions for this procedure. **This has been ordered to be completed at our [facility] location - [place], [address].  Dr Sallyanne Kuster recommends that you schedule a follow-up appointment first available.  If you need a refill on your cardiac medications before your next appointment, please call your pharmacy.

## 2017-04-27 NOTE — Progress Notes (Signed)
Cardiology Consultation Note:    Date:  04/27/2017   ID:  [name], DOB [birth year], MRN [id number]  PCP:  Deland Pretty, MD  Cardiologist:  Sanda Klein, MD    Referring MD: Deland Pretty, MD   Chief Complaint  Patient presents with  . New Patient (Initial Visit)    heart murmor, HIGH bp, CHEST TIGHTNESS  [name] is a 46 y.o. female who is being seen today for the evaluation of murmur and HTN at the request of Deland Pretty, MD.   History of Present Illness:    [name] is a 46 y.o. female with a hx of Hypertension, hyperlipidemia obesity and a newly diagnosed cardiac murmur.  In her 42s she was found to have elevated cholesterol in the 250 range. She engaged in marked changes in her lifestyle with improved diet and physical activity and was able to bring the cholesterol back down to normal range within about 8 weeks. She is to be an active Human resources officer. She was also diagnosed with hypertension at least 10 years ago, previously well controlled. She does not have diabetes mellitus but does have hypertriglyceridemia. She has never had coronary or vascular disease that she is aware of. Her mother underwent bypass surgery for multivessel CAD just a few weeks ago.  Several years ago she had problems with Lyme disease and exposure to toxic mold and since then her health has deteriorated. She has gained a lot of weight and is now moderately obese. She is much less physically active. She underwent a cholecystectomy which led to problems with diarrhea for which she takes WelChol. She has had a lot of problems with interstitial cystitis. She has also been diagnosed with fibromyalgia and irritable bowel syndrome and has had nephrolithiasis. He is premenopausal but has had a right oophorectomy.  She underwent an echocardiogram in June 2010 read by Dr. Angelena Sole, during her bout with Lyme disease. It was a normal echo. EF 55-60%, no valvular abnormalities. Her  electrocardiogram is normal, with rightward axis.  Past Medical History:  Diagnosis Date  . Anxiety   . Arrhythmia    heart races when pt in pain  . Asthma   . Clostridium difficile infection 2014  . Randell Patient virus infection   . Fibromyalgia   . GERD (gastroesophageal reflux disease)   . Hypertension   . Hypothyroidism   . Immune deficiency disorder ([place])   . Lyme disease   . Mold contact confirmed    Toxic Mold syndrome  . Mononucleosis 5-10  . Ovarian cyst    Serous Cystadenofibroma-Left ovary  . PONV (postoperative nausea and vomiting)   . Shortness of breath    on exertion  . Systolic murmur   . Thyroid disease     Past Surgical History:  Procedure Laterality Date  . CHOLECYSTECTOMY    . FECAL TRANSPLANT    . Groin cyst    . LAPAROSCOPY Right 06/12/2013   Procedure: LAPAROSCOPY OPERATIVE  fulgeration of endometriosis, right salpingooophorectomy;  Surgeon: Terrance Mass, MD;  Location: [facility];  Service: Gynecology;  Laterality: Right;  . Nose cyst    . OOPHORECTOMY Right 06/12/2013   Procedure: OOPHORECTOMY;POSS RIGHT SALPING OOPHORECTOMY;  Surgeon: Terrance Mass, MD;  Location: [facility];  Service: Gynecology;  Laterality: Right;  . PELVIC LAPAROSCOPY     DL Ovarian cystectomy-Left  . TONSILLECTOMY  2009    Current Medications: Current Meds  Medication Sig  . albuterol (PROAIR HFA)  108 (90 BASE) MCG/ACT inhaler Inhale 2 puffs into the lungs every 6 (six) hours as needed for wheezing or shortness of breath.  . ALPRAZolam (XANAX) 1 MG tablet Take 0.5 mg by mouth at bedtime as needed.   . bisoprolol (ZEBETA) 10 MG tablet Take 10 mg by mouth daily.  . CefTRIAXone Sodium (ROCEPHIN IV) Inject into the vein 2 (two) times a week. Reported on 10/04/2015  . Cholecalciferol (VITAMIN D PO) Take 1 tablet by mouth daily.   . citalopram (CELEXA) 40 MG tablet Take 40 mg by mouth daily.  . colesevelam (WELCHOL) 625 MG tablet Take 625 mg by mouth 2 (two) times daily with a  meal.  . diazepam (VALIUM) 10 MG tablet Take 10 mg by mouth every 6 (six) hours as needed for anxiety. VAGINAL SUPPOSITORY  . ELMIRON 100 MG capsule Take 100 mg by mouth daily.  . fluticasone (FLONASE) 50 MCG/ACT nasal spray Place 1 spray into both nostrils daily.  Marland Kitchen HYOSCYAMINE PO Take 0.125 mg by mouth daily.   . Melatonin 10 MG TABS Take by mouth.  . nabumetone (RELAFEN) 500 MG tablet Take 500 mg by mouth daily.  . norethindrone-ethinyl estradiol (JUNEL FE 1/20) 1-20 MG-MCG tablet Take 1 tablet by mouth daily.  . OXYCONTIN 15 MG 12 hr tablet Take 15 mg by mouth every 12 (twelve) hours.  . Probiotic Product (ALIGN PO) Take by mouth as needed.  . prochlorperazine (COMPAZINE) 10 MG tablet Take 10 mg by mouth every 6 (six) hours as needed.  . promethazine (PHENERGAN) 25 MG tablet Take 25 mg by mouth every 6 (six) hours as needed.  Marland Kitchen rOPINIRole (REQUIP) 1 MG tablet Take 1 mg by mouth 3 (three) times daily.  Marland Kitchen scopolamine (TRANSDERM-SCOP) 1 MG/3DAYS Place 1 patch onto the skin every 3 (three) days.  Marland Kitchen telmisartan (MICARDIS) 80 MG tablet Take 80 mg by mouth daily.  Marland Kitchen thyroid (ARMOUR) 30 MG tablet Take 30 mg by mouth daily before breakfast.  . tiaGABine (GABITRIL) 4 MG tablet Take 4 mg by mouth at bedtime.  . vitamin B-12 (CYANOCOBALAMIN) 500 MCG tablet Take 500 mcg by mouth daily.     Allergies:   Allegra [fexofenadine hcl]; Fexofenadine; Oseltamivir; Vancomycin; Diphenhydramine; Sulfamethoxazole; Doxycycline; Other; Diphenhydramine hcl; Morphine; Oseltamivir phosphate; Penicillins; and Sulfonamide derivatives   Social History   Social History  . Marital status: Single    Spouse name: N/A  . Number of children: N/A  . Years of education: N/A   Social History Main Topics  . Smoking status: Never Smoker  . Smokeless tobacco: Never Used  . Alcohol use No  . Drug use: No  . Sexual activity: No   Other Topics Concern  . None   Social History Narrative  . None     Family  History: The patient's family history includes Asthma in her paternal grandmother; Breast cancer in her maternal aunt and paternal grandmother; Diabetes in her mother; Heart disease in her maternal grandfather; Hypertension in her mother; Pancreatic cancer in her maternal grandmother; Pulmonary embolism in her mother. ROS:   Please see the history of present illness.     All other systems reviewed and are negative.  EKGs/Labs/Other Studies Reviewed:    The following studies were reviewed today: Notes from Dr. Shelia Media, recent ECG and labs, echo from 2010  EKG:  EKG is ordered today.  The ekg ordered today demonstrates normal sinus rhythm with minor nonspecific T-wave flattening in the lateral leads  Recent Labs: No results found  for requested labs within last 8760 hours.  Recent Lipid Panel No results found for: CHOL, TRIG, HDL, CHOLHDL, VLDL, LDLCALC, LDLDIRECT  Physical Exam:    VS:  BP 140/90 (BP Location: Left Arm, Patient Position: Sitting, Cuff Size: Large)   Ht 5\' 1"  (1.549 m)   Wt 192 lb (87.1 kg)   BMI 36.28 kg/m     Wt Readings from Last 3 Encounters:  04/27/17 192 lb (87.1 kg)  11/30/16 199 lb (90.3 kg)  10/04/15 200 lb (90.7 kg)     GEN: Out of the obese, Well nourished, well developed in no acute distress HEENT: Normal NECK: No JVD; No carotid bruits LYMPHATICS: No lymphadenopathy CARDIAC: There is a grade 1-2/6 ejection murmur that is heard best at the left lower sternal border but has more of a crescendo decrescendo quality. It does appear to increase with the Valsalva maneuver and disappears with handgrip. RRR, no diastolic murmurs, rubs, gallops RESPIRATORY:  Clear to auscultation without rales, wheezing or rhonchi  ABDOMEN: Soft, non-tender, non-distended MUSCULOSKELETAL:  No edema; No deformity  SKIN: Warm and dry NEUROLOGIC:  Alert and oriented x 3 PSYCHIATRIC:  Normal affect   ASSESSMENT:    1. Heart murmur   2. Essential hypertension   3. Mixed  hyperlipidemia   4. Class 2 severe obesity due to excess calories with serious comorbidity and body mass index (BMI) of 35.0 to 35.9 in adult [facility])    PLAN:    In order of problems listed above:  1. Murmur: Recommend a repeat echocardiogram. The murmur has some features suggestive of functional murmur, but would be useful to exclude mitral valve prolapse or dynamic LV outflow tract obstruction. 2. HTN: SPECT increased weight has a lot to do with the harder to control blood pressure. She is on maximum doses of ARB and beta blocker. Recommend adding a low-dose of amlodipine. Would like to avoid diuretics the cousin of her problems with cystitis. 3. HLP: She reports that her cholesterol is "good" but she has hypertriglyceridemia. This may be related to obesity, but could also be a side effect of treatment with a resin for her biliary salt diarrhea. The low HDL cholesterol is clearly a marker of metabolic syndrome and suggests that she has small dense LDL cholesterol. On the other hand the overall LDL level is quite low. She seems to be genetically quite similar to her mother and is at risk of developing diabetes mellitus and early coronary artery disease, unless we can increase her activity level and make her lose weight. Discussed the need to restrict intake of carbohydrate calories, especially sweets and high glycemic index largest. At this point I would not recommend statin therapy. 4. Obesity seems to be her biggest liability for future complications. Aggressive attempts at weight loss are recommended.   Medication Adjustments/Labs and Tests Ordered: Current medicines are reviewed at length with the patient today.  Concerns regarding medicines are outlined above.  Orders Placed This Encounter  Procedures  . EKG 12-Lead  . ECHOCARDIOGRAM COMPLETE   Meds ordered this encounter  Medications  . amLODipine (NORVASC) 2.5 MG tablet    Sig: Take 1 tablet (2.5 mg total) by mouth daily.    Dispense:   90 tablet    Refill:  3    Signed, Sanda Klein, MD  04/27/2017 2:39 PM    [place]

## 2017-05-01 ENCOUNTER — Ambulatory Visit
Admission: RE | Admit: 2017-05-01 | Discharge: 2017-05-01 | Disposition: A | Discharge status: Home / Self Care | Payer: BLUE CROSS/BLUE SHIELD | Source: Ambulatory Visit | Attending: Cardiovascular Disease | Admitting: Cardiovascular Disease

## 2017-05-01 DIAGNOSIS — R011 Cardiac murmur, unspecified: Secondary | ICD-10-CM | POA: Insufficient documentation

## 2017-05-01 NOTE — Progress Notes (Signed)
*  PRELIMINARY RESULTS* Echocardiogram 2D Echocardiogram has been performed.  [name] 05/01/2017, 12:26 PM

## 2017-05-02 DIAGNOSIS — R278 Other lack of coordination: Secondary | ICD-10-CM | POA: Diagnosis not present

## 2017-05-02 DIAGNOSIS — M6281 Muscle weakness (generalized): Secondary | ICD-10-CM | POA: Diagnosis not present

## 2017-05-02 DIAGNOSIS — R350 Frequency of micturition: Secondary | ICD-10-CM | POA: Diagnosis not present

## 2017-05-02 DIAGNOSIS — M62838 Other muscle spasm: Secondary | ICD-10-CM | POA: Diagnosis not present

## 2017-05-08 DIAGNOSIS — M542 Cervicalgia: Secondary | ICD-10-CM | POA: Diagnosis not present

## 2017-05-08 DIAGNOSIS — G894 Chronic pain syndrome: Secondary | ICD-10-CM | POA: Diagnosis not present

## 2017-05-08 DIAGNOSIS — R1084 Generalized abdominal pain: Secondary | ICD-10-CM | POA: Diagnosis not present

## 2017-05-10 DIAGNOSIS — N390 Urinary tract infection, site not specified: Secondary | ICD-10-CM | POA: Diagnosis not present

## 2017-05-10 DIAGNOSIS — I10 Essential (primary) hypertension: Secondary | ICD-10-CM | POA: Diagnosis not present

## 2017-05-10 DIAGNOSIS — R350 Frequency of micturition: Secondary | ICD-10-CM | POA: Diagnosis not present

## 2017-05-10 DIAGNOSIS — R300 Dysuria: Secondary | ICD-10-CM | POA: Diagnosis not present

## 2017-05-10 DIAGNOSIS — N9412 Deep dyspareunia: Secondary | ICD-10-CM | POA: Diagnosis not present

## 2017-05-10 DIAGNOSIS — M6281 Muscle weakness (generalized): Secondary | ICD-10-CM | POA: Diagnosis not present

## 2017-05-10 DIAGNOSIS — Z0000 Encounter for general adult medical examination without abnormal findings: Secondary | ICD-10-CM | POA: Diagnosis not present

## 2017-05-10 DIAGNOSIS — M62838 Other muscle spasm: Secondary | ICD-10-CM | POA: Diagnosis not present

## 2017-05-14 DIAGNOSIS — M6281 Muscle weakness (generalized): Secondary | ICD-10-CM | POA: Diagnosis not present

## 2017-05-14 DIAGNOSIS — M6289 Other specified disorders of muscle: Secondary | ICD-10-CM | POA: Diagnosis not present

## 2017-05-14 DIAGNOSIS — M62838 Other muscle spasm: Secondary | ICD-10-CM | POA: Diagnosis not present

## 2017-05-14 DIAGNOSIS — R102 Pelvic and perineal pain: Secondary | ICD-10-CM | POA: Diagnosis not present

## 2017-05-16 DIAGNOSIS — M6281 Muscle weakness (generalized): Secondary | ICD-10-CM | POA: Diagnosis not present

## 2017-05-16 DIAGNOSIS — R3915 Urgency of urination: Secondary | ICD-10-CM | POA: Diagnosis not present

## 2017-05-16 DIAGNOSIS — M62838 Other muscle spasm: Secondary | ICD-10-CM | POA: Diagnosis not present

## 2017-05-16 DIAGNOSIS — R3982 Chronic bladder pain: Secondary | ICD-10-CM | POA: Diagnosis not present

## 2017-05-22 DIAGNOSIS — N3010 Interstitial cystitis (chronic) without hematuria: Secondary | ICD-10-CM | POA: Diagnosis not present

## 2017-05-22 DIAGNOSIS — R300 Dysuria: Secondary | ICD-10-CM | POA: Diagnosis not present

## 2017-05-22 DIAGNOSIS — R3915 Urgency of urination: Secondary | ICD-10-CM | POA: Diagnosis not present

## 2017-05-22 DIAGNOSIS — I10 Essential (primary) hypertension: Secondary | ICD-10-CM | POA: Diagnosis not present

## 2017-05-24 DIAGNOSIS — R102 Pelvic and perineal pain: Secondary | ICD-10-CM | POA: Diagnosis not present

## 2017-05-24 DIAGNOSIS — M6289 Other specified disorders of muscle: Secondary | ICD-10-CM | POA: Diagnosis not present

## 2017-05-24 DIAGNOSIS — M6281 Muscle weakness (generalized): Secondary | ICD-10-CM | POA: Diagnosis not present

## 2017-05-24 DIAGNOSIS — M62838 Other muscle spasm: Secondary | ICD-10-CM | POA: Diagnosis not present

## 2017-05-28 DIAGNOSIS — N9412 Deep dyspareunia: Secondary | ICD-10-CM | POA: Diagnosis not present

## 2017-05-28 DIAGNOSIS — M6281 Muscle weakness (generalized): Secondary | ICD-10-CM | POA: Diagnosis not present

## 2017-05-28 DIAGNOSIS — M62838 Other muscle spasm: Secondary | ICD-10-CM | POA: Diagnosis not present

## 2017-05-28 DIAGNOSIS — R3915 Urgency of urination: Secondary | ICD-10-CM | POA: Diagnosis not present

## 2017-05-29 DIAGNOSIS — G894 Chronic pain syndrome: Secondary | ICD-10-CM | POA: Diagnosis not present

## 2017-05-29 DIAGNOSIS — R1084 Generalized abdominal pain: Secondary | ICD-10-CM | POA: Diagnosis not present

## 2017-05-29 DIAGNOSIS — M542 Cervicalgia: Secondary | ICD-10-CM | POA: Diagnosis not present

## 2017-06-04 DIAGNOSIS — R102 Pelvic and perineal pain: Secondary | ICD-10-CM | POA: Diagnosis not present

## 2017-06-04 DIAGNOSIS — M6281 Muscle weakness (generalized): Secondary | ICD-10-CM | POA: Diagnosis not present

## 2017-06-04 DIAGNOSIS — M62838 Other muscle spasm: Secondary | ICD-10-CM | POA: Diagnosis not present

## 2017-06-04 DIAGNOSIS — M6289 Other specified disorders of muscle: Secondary | ICD-10-CM | POA: Diagnosis not present

## 2017-06-05 DIAGNOSIS — M62838 Other muscle spasm: Secondary | ICD-10-CM | POA: Diagnosis not present

## 2017-06-05 DIAGNOSIS — R102 Pelvic and perineal pain: Secondary | ICD-10-CM | POA: Diagnosis not present

## 2017-06-05 DIAGNOSIS — M6281 Muscle weakness (generalized): Secondary | ICD-10-CM | POA: Diagnosis not present

## 2017-06-05 DIAGNOSIS — M6289 Other specified disorders of muscle: Secondary | ICD-10-CM | POA: Diagnosis not present

## 2017-06-12 DIAGNOSIS — Z5181 Encounter for therapeutic drug level monitoring: Secondary | ICD-10-CM | POA: Diagnosis not present

## 2017-06-12 DIAGNOSIS — G894 Chronic pain syndrome: Secondary | ICD-10-CM | POA: Diagnosis not present

## 2017-06-12 DIAGNOSIS — R1084 Generalized abdominal pain: Secondary | ICD-10-CM | POA: Diagnosis not present

## 2017-06-12 DIAGNOSIS — M542 Cervicalgia: Secondary | ICD-10-CM | POA: Diagnosis not present

## 2017-06-12 DIAGNOSIS — Z79899 Other long term (current) drug therapy: Secondary | ICD-10-CM | POA: Diagnosis not present

## 2017-06-14 DIAGNOSIS — M62838 Other muscle spasm: Secondary | ICD-10-CM | POA: Diagnosis not present

## 2017-06-14 DIAGNOSIS — M6289 Other specified disorders of muscle: Secondary | ICD-10-CM | POA: Diagnosis not present

## 2017-06-14 DIAGNOSIS — R3915 Urgency of urination: Secondary | ICD-10-CM | POA: Diagnosis not present

## 2017-06-14 DIAGNOSIS — M6281 Muscle weakness (generalized): Secondary | ICD-10-CM | POA: Diagnosis not present

## 2017-06-19 DIAGNOSIS — M62838 Other muscle spasm: Secondary | ICD-10-CM | POA: Diagnosis not present

## 2017-06-19 DIAGNOSIS — M6281 Muscle weakness (generalized): Secondary | ICD-10-CM | POA: Diagnosis not present

## 2017-06-19 DIAGNOSIS — M6289 Other specified disorders of muscle: Secondary | ICD-10-CM | POA: Diagnosis not present

## 2017-06-19 DIAGNOSIS — R102 Pelvic and perineal pain: Secondary | ICD-10-CM | POA: Diagnosis not present

## 2017-06-21 DIAGNOSIS — M62838 Other muscle spasm: Secondary | ICD-10-CM | POA: Diagnosis not present

## 2017-06-21 DIAGNOSIS — R3982 Chronic bladder pain: Secondary | ICD-10-CM | POA: Diagnosis not present

## 2017-06-21 DIAGNOSIS — R102 Pelvic and perineal pain: Secondary | ICD-10-CM | POA: Diagnosis not present

## 2017-06-21 DIAGNOSIS — M6281 Muscle weakness (generalized): Secondary | ICD-10-CM | POA: Diagnosis not present

## 2017-06-25 DIAGNOSIS — M62838 Other muscle spasm: Secondary | ICD-10-CM | POA: Diagnosis not present

## 2017-06-25 DIAGNOSIS — R102 Pelvic and perineal pain: Secondary | ICD-10-CM | POA: Diagnosis not present

## 2017-06-25 DIAGNOSIS — M6289 Other specified disorders of muscle: Secondary | ICD-10-CM | POA: Diagnosis not present

## 2017-06-25 DIAGNOSIS — M6281 Muscle weakness (generalized): Secondary | ICD-10-CM | POA: Diagnosis not present

## 2017-06-26 DIAGNOSIS — N3010 Interstitial cystitis (chronic) without hematuria: Secondary | ICD-10-CM | POA: Diagnosis not present

## 2017-06-26 DIAGNOSIS — N9489 Other specified conditions associated with female genital organs and menstrual cycle: Secondary | ICD-10-CM | POA: Diagnosis not present

## 2017-06-28 DIAGNOSIS — M62838 Other muscle spasm: Secondary | ICD-10-CM | POA: Diagnosis not present

## 2017-06-28 DIAGNOSIS — R3982 Chronic bladder pain: Secondary | ICD-10-CM | POA: Diagnosis not present

## 2017-06-28 DIAGNOSIS — R102 Pelvic and perineal pain: Secondary | ICD-10-CM | POA: Diagnosis not present

## 2017-06-28 DIAGNOSIS — M6281 Muscle weakness (generalized): Secondary | ICD-10-CM | POA: Diagnosis not present

## 2017-07-03 DIAGNOSIS — R1084 Generalized abdominal pain: Secondary | ICD-10-CM | POA: Diagnosis not present

## 2017-07-03 DIAGNOSIS — G894 Chronic pain syndrome: Secondary | ICD-10-CM | POA: Diagnosis not present

## 2017-07-03 DIAGNOSIS — M542 Cervicalgia: Secondary | ICD-10-CM | POA: Diagnosis not present

## 2017-07-10 DIAGNOSIS — R102 Pelvic and perineal pain: Secondary | ICD-10-CM | POA: Diagnosis not present

## 2017-07-10 DIAGNOSIS — M62838 Other muscle spasm: Secondary | ICD-10-CM | POA: Diagnosis not present

## 2017-07-10 DIAGNOSIS — M6289 Other specified disorders of muscle: Secondary | ICD-10-CM | POA: Diagnosis not present

## 2017-07-10 DIAGNOSIS — M6281 Muscle weakness (generalized): Secondary | ICD-10-CM | POA: Diagnosis not present

## 2017-07-12 DIAGNOSIS — R350 Frequency of micturition: Secondary | ICD-10-CM | POA: Diagnosis not present

## 2017-07-12 DIAGNOSIS — M6289 Other specified disorders of muscle: Secondary | ICD-10-CM | POA: Diagnosis not present

## 2017-07-12 DIAGNOSIS — M62838 Other muscle spasm: Secondary | ICD-10-CM | POA: Diagnosis not present

## 2017-07-12 DIAGNOSIS — M6281 Muscle weakness (generalized): Secondary | ICD-10-CM | POA: Diagnosis not present

## 2017-07-16 DIAGNOSIS — M62838 Other muscle spasm: Secondary | ICD-10-CM | POA: Diagnosis not present

## 2017-07-16 DIAGNOSIS — R350 Frequency of micturition: Secondary | ICD-10-CM | POA: Diagnosis not present

## 2017-07-16 DIAGNOSIS — M6289 Other specified disorders of muscle: Secondary | ICD-10-CM | POA: Diagnosis not present

## 2017-07-16 DIAGNOSIS — M6281 Muscle weakness (generalized): Secondary | ICD-10-CM | POA: Diagnosis not present

## 2017-07-17 DIAGNOSIS — E039 Hypothyroidism, unspecified: Secondary | ICD-10-CM | POA: Diagnosis not present

## 2017-07-17 DIAGNOSIS — R5382 Chronic fatigue, unspecified: Secondary | ICD-10-CM | POA: Diagnosis not present

## 2017-07-17 DIAGNOSIS — E569 Vitamin deficiency, unspecified: Secondary | ICD-10-CM | POA: Diagnosis not present

## 2017-07-17 DIAGNOSIS — Z139 Encounter for screening, unspecified: Secondary | ICD-10-CM | POA: Diagnosis not present

## 2017-07-17 DIAGNOSIS — D510 Vitamin B12 deficiency anemia due to intrinsic factor deficiency: Secondary | ICD-10-CM | POA: Diagnosis not present

## 2017-07-17 DIAGNOSIS — G588 Other specified mononeuropathies: Secondary | ICD-10-CM | POA: Diagnosis not present

## 2017-07-17 DIAGNOSIS — E559 Vitamin D deficiency, unspecified: Secondary | ICD-10-CM | POA: Diagnosis not present

## 2017-07-17 DIAGNOSIS — R799 Abnormal finding of blood chemistry, unspecified: Secondary | ICD-10-CM | POA: Diagnosis not present

## 2017-07-30 DIAGNOSIS — R1084 Generalized abdominal pain: Secondary | ICD-10-CM | POA: Diagnosis not present

## 2017-07-30 DIAGNOSIS — Z79899 Other long term (current) drug therapy: Secondary | ICD-10-CM | POA: Diagnosis not present

## 2017-07-30 DIAGNOSIS — G588 Other specified mononeuropathies: Secondary | ICD-10-CM | POA: Diagnosis not present

## 2017-07-30 DIAGNOSIS — M542 Cervicalgia: Secondary | ICD-10-CM | POA: Diagnosis not present

## 2017-07-30 DIAGNOSIS — Z5181 Encounter for therapeutic drug level monitoring: Secondary | ICD-10-CM | POA: Diagnosis not present

## 2017-07-30 DIAGNOSIS — G894 Chronic pain syndrome: Secondary | ICD-10-CM | POA: Diagnosis not present

## 2017-07-31 DIAGNOSIS — M6289 Other specified disorders of muscle: Secondary | ICD-10-CM | POA: Diagnosis not present

## 2017-07-31 DIAGNOSIS — R102 Pelvic and perineal pain: Secondary | ICD-10-CM | POA: Diagnosis not present

## 2017-07-31 DIAGNOSIS — M6281 Muscle weakness (generalized): Secondary | ICD-10-CM | POA: Diagnosis not present

## 2017-07-31 DIAGNOSIS — M62838 Other muscle spasm: Secondary | ICD-10-CM | POA: Diagnosis not present

## 2017-08-02 DIAGNOSIS — M62838 Other muscle spasm: Secondary | ICD-10-CM | POA: Diagnosis not present

## 2017-08-02 DIAGNOSIS — R102 Pelvic and perineal pain: Secondary | ICD-10-CM | POA: Diagnosis not present

## 2017-08-02 DIAGNOSIS — M6281 Muscle weakness (generalized): Secondary | ICD-10-CM | POA: Diagnosis not present

## 2017-08-02 DIAGNOSIS — M6289 Other specified disorders of muscle: Secondary | ICD-10-CM | POA: Diagnosis not present

## 2017-08-06 ENCOUNTER — Ambulatory Visit (INDEPENDENT_AMBULATORY_CARE_PROVIDER_SITE_OTHER): Payer: BLUE CROSS/BLUE SHIELD | Admitting: Cardiovascular Disease

## 2017-08-06 ENCOUNTER — Encounter: Payer: Self-pay | Admitting: Cardiovascular Disease

## 2017-08-06 VITALS — BP 118/76 | HR 81 | Ht 61.0 in | Wt 200.8 lb

## 2017-08-06 DIAGNOSIS — E6609 Other obesity due to excess calories: Secondary | ICD-10-CM | POA: Diagnosis not present

## 2017-08-06 DIAGNOSIS — Z6837 Body mass index (BMI) 37.0-37.9, adult: Secondary | ICD-10-CM

## 2017-08-06 DIAGNOSIS — E781 Pure hyperglyceridemia: Secondary | ICD-10-CM

## 2017-08-06 DIAGNOSIS — R011 Cardiac murmur, unspecified: Secondary | ICD-10-CM

## 2017-08-06 DIAGNOSIS — I10 Essential (primary) hypertension: Secondary | ICD-10-CM | POA: Diagnosis not present

## 2017-08-06 NOTE — Patient Instructions (Signed)
Dr Croitoru recommends that you schedule a follow-up appointment in 12 months. You will receive a reminder letter in the mail two months in advance. If you don't receive a letter, please call our office to schedule the follow-up appointment.  If you need a refill on your cardiac medications before your next appointment, please call your pharmacy. 

## 2017-08-06 NOTE — Progress Notes (Signed)
Cardiology office note:    Date:  08/07/2017   ID:  [name], DOB [birth year], MRN [id number]  PCP:  Deland Pretty, MD  Cardiologist:  Sanda Klein, MD    Referring MD: Deland Pretty, MD   chief complaint: coronary risk factors   History of Present Illness:    [name] is a 46 y.o. female with a hx of Hypertension, hyperlipidemia, obesity and an innocent cardiac murmur.  Her mother had bypass surgery for multivessel coronary artery disease presenting as syncope roughly a year ago.  She generally feels well.  Although she remains moderately obese she is physically active.  She is limited primarily by joint problems which she attributes to a diagnosis of Lyme disease and previous experience to black mold.  She continues to take WelChol for postcholecystectomy diarrhea.  Echo performed earlier this year, for a systolic murmur, showed no serious valvular abnormality and showed normal left ventricular size and systolic function.  Essentially a normal echo.  She has had a lot of problems with interstitial cystitis. She has also been diagnosed with fibromyalgia and irritable bowel syndrome and has had nephrolithiasis. She is premenopausal but has had a right oophorectomy.  Past Medical History:  Diagnosis Date  . Anxiety   . Arrhythmia    heart races when pt in pain  . Asthma   . Clostridium difficile infection 2014  . Randell Patient virus infection   . Fibromyalgia   . GERD (gastroesophageal reflux disease)   . Hypertension   . Hypothyroidism   . Immune deficiency disorder ([place])   . Lyme disease   . Mold contact confirmed    Toxic Mold syndrome  . Mononucleosis 5-10  . Ovarian cyst    Serous Cystadenofibroma-Left ovary  . PONV (postoperative nausea and vomiting)   . Shortness of breath    on exertion  . Systolic murmur   . Thyroid disease     Past Surgical History:  Procedure Laterality Date  . CHOLECYSTECTOMY    . FECAL TRANSPLANT    . Groin cyst    .  LAPAROSCOPY Right 06/12/2013   Procedure: LAPAROSCOPY OPERATIVE  fulgeration of endometriosis, right salpingooophorectomy;  Surgeon: Terrance Mass, MD;  Location: [facility];  Service: Gynecology;  Laterality: Right;  . Nose cyst    . OOPHORECTOMY Right 06/12/2013   Procedure: OOPHORECTOMY;POSS RIGHT SALPING OOPHORECTOMY;  Surgeon: Terrance Mass, MD;  Location: [facility];  Service: Gynecology;  Laterality: Right;  . PELVIC LAPAROSCOPY     DL Ovarian cystectomy-Left  . TONSILLECTOMY  2009    Current Medications: Current Meds  Medication Sig  . albuterol (PROAIR HFA) 108 (90 BASE) MCG/ACT inhaler Inhale 2 puffs into the lungs every 6 (six) hours as needed for wheezing or shortness of breath.  . ALPRAZolam (XANAX) 1 MG tablet Take 0.5 mg by mouth at bedtime as needed.   Marland Kitchen amLODipine (NORVASC) 2.5 MG tablet Take 2.5 mg by mouth daily.  . bisoprolol (ZEBETA) 10 MG tablet Take 10 mg by mouth daily.  . CefTRIAXone Sodium (ROCEPHIN IV) Inject into the vein 2 (two) times a week. Reported on 10/04/2015  . Cholecalciferol (VITAMIN D PO) Take 1 tablet by mouth daily.   . citalopram (CELEXA) 40 MG tablet Take 40 mg by mouth daily.  . colesevelam (WELCHOL) 625 MG tablet Take 625 mg by mouth 2 (two) times daily with a meal.  . diazepam (VALIUM) 10 MG tablet Take 10 mg by mouth every 6 (six) hours as  needed for anxiety. VAGINAL SUPPOSITORY  . ELMIRON 100 MG capsule Take 100 mg by mouth daily.  . fluticasone (FLONASE) 50 MCG/ACT nasal spray Place 1 spray into both nostrils daily.  Marland Kitchen HYOSCYAMINE PO Take 0.125 mg by mouth daily.   . Melatonin 10 MG TABS Take by mouth.  . nabumetone (RELAFEN) 500 MG tablet Take 500 mg by mouth daily.  . norethindrone-ethinyl estradiol (JUNEL FE 1/20) 1-20 MG-MCG tablet Take 1 tablet by mouth daily.  . OXYCONTIN 15 MG 12 hr tablet Take 15 mg by mouth every 12 (twelve) hours.  . Probiotic Product (ALIGN PO) Take by mouth as needed.  . prochlorperazine (COMPAZINE) 10 MG tablet  Take 10 mg by mouth every 6 (six) hours as needed.  . promethazine (PHENERGAN) 25 MG tablet Take 25 mg by mouth every 6 (six) hours as needed.  Marland Kitchen rOPINIRole (REQUIP) 1 MG tablet Take 1 mg by mouth 3 (three) times daily.  Marland Kitchen scopolamine (TRANSDERM-SCOP) 1 MG/3DAYS Place 1 patch onto the skin every 3 (three) days.  Marland Kitchen telmisartan (MICARDIS) 80 MG tablet Take 80 mg by mouth daily.  Marland Kitchen thyroid (ARMOUR) 30 MG tablet Take 30 mg by mouth daily before breakfast.  . tiaGABine (GABITRIL) 4 MG tablet Take 4 mg by mouth at bedtime.  . vitamin B-12 (CYANOCOBALAMIN) 500 MCG tablet Take 500 mcg by mouth daily.     Allergies:   Allegra [fexofenadine hcl]; Fexofenadine; Oseltamivir; Tamiflu [oseltamivir phosphate]; Vancomycin; Diphenhydramine; Sulfamethoxazole; Doxycycline; Other; Diphenhydramine hcl; Morphine; Oseltamivir phosphate; Penicillins; and Sulfonamide derivatives   Social History   Socioeconomic History  . Marital status: Single    Spouse name: None  . Number of children: None  . Years of education: None  . Highest education level: None  Social Needs  . Financial resource strain: None  . Food insecurity - worry: None  . Food insecurity - inability: None  . Transportation needs - medical: None  . Transportation needs - non-medical: None  Occupational History  . None  Tobacco Use  . Smoking status: Never Smoker  . Smokeless tobacco: Never Used  Substance and Sexual Activity  . Alcohol use: No    Alcohol/week: 0.0 oz  . Drug use: No  . Sexual activity: No    Birth control/protection: Pill  Other Topics Concern  . None  Social History Narrative  . None     Family History: The patient's family history includes Asthma in her paternal grandmother; Breast cancer in her maternal aunt and paternal grandmother; Diabetes in her mother; Heart disease in her maternal grandfather; Hypertension in her mother; Pancreatic cancer in her maternal grandmother; Pulmonary embolism in her mother. ROS:     Please see the history of present illness.     All other systems reviewed and are negative.  EKGs/Labs/Other Studies Reviewed:    The following studies were reviewed today: Echo images from August 2018  EKG:  EKG is ordered today.  The ekg ordered today demonstrates normal sinus rhythm, normal tracing   Physical Exam:    VS:  BP 118/76   Pulse 81   Ht 5\' 1"  (1.549 m)   Wt 200 lb 12.8 oz (91.1 kg)   BMI 37.94 kg/m      Wt Readings from Last 3 Encounters:  08/06/17 200 lb 12.8 oz (91.1 kg)  04/27/17 192 lb (87.1 kg)  11/30/16 199 lb (90.3 kg)      General: Alert, oriented x3, no distress, moderately obese Head: no evidence of trauma, PERRL, EOMI,  no exophtalmos or lid lag, no myxedema, no xanthelasma; normal ears, nose and oropharynx Neck: normal jugular venous pulsations and no hepatojugular reflux; brisk carotid pulses without delay and no carotid bruits Chest: clear to auscultation, no signs of consolidation by percussion or palpation, normal fremitus, symmetrical and full respiratory excursions Cardiovascular: normal position and quality of the apical impulse, regular rhythm, normal first and second heart sounds, grade 1-2/6 ejection murmur that is heard best at the left lower sternal border but has more of a crescendo decrescendo quality, no diastolic murmurs, rubs or gallops Abdomen: no tenderness or distention, no masses by palpation, no abnormal pulsatility or arterial bruits, normal bowel sounds, no hepatosplenomegaly Extremities: no clubbing, cyanosis or edema; 2+ radial, ulnar and brachial pulses bilaterally; 2+ right femoral, posterior tibial and dorsalis pedis pulses; 2+ left femoral, posterior tibial and dorsalis pedis pulses; no subclavian or femoral bruits Neurological: grossly nonfocal Psych: Normal mood and affect  ASSESSMENT:    1. Systolic murmur   2. Essential hypertension   3. Hypertriglyceridemia   4. Class 2 obesity due to excess calories without  serious comorbidity with body mass index (BMI) of 37.0 to 37.9 in adult    PLAN:    In order of problems listed above:  1. Murmur: No evidence of significant cardiac pathology by echo 2. HTN: Excellent blood pressure control.Marland Kitchen  Unfortunately she has gained back she is interested in reducing the number of antihypertensives that she takes.  I think this is achievable if she loses weight over the weight that she lost earlier this year. 3. HLP: We will request her most recent lipid profile from her primary care provider.  Elevated triglycerides may be due to treatment with the resin or due to obesity/insulin resistance. 4. Obesity seems to be her biggest liability for future complications. Aggressive attempts at weight loss are recommended.  If she cannot exercise, she needs to eat even more conscientious with calorie restriction   Medication Adjustments/Labs and Tests Ordered: Current medicines are reviewed at length with the patient today.  Concerns regarding medicines are outlined above.  No orders of the defined types were placed in this encounter.  No orders of the defined types were placed in this encounter.   Signed, Sanda Klein, MD  08/07/2017 4:20 PM    [facility]

## 2017-08-07 DIAGNOSIS — M62838 Other muscle spasm: Secondary | ICD-10-CM | POA: Diagnosis not present

## 2017-08-07 DIAGNOSIS — M6281 Muscle weakness (generalized): Secondary | ICD-10-CM | POA: Diagnosis not present

## 2017-08-07 DIAGNOSIS — R102 Pelvic and perineal pain: Secondary | ICD-10-CM | POA: Diagnosis not present

## 2017-08-07 DIAGNOSIS — M6289 Other specified disorders of muscle: Secondary | ICD-10-CM | POA: Diagnosis not present

## 2017-08-08 DIAGNOSIS — R3982 Chronic bladder pain: Secondary | ICD-10-CM | POA: Diagnosis not present

## 2017-08-08 DIAGNOSIS — M6281 Muscle weakness (generalized): Secondary | ICD-10-CM | POA: Diagnosis not present

## 2017-08-08 DIAGNOSIS — R102 Pelvic and perineal pain: Secondary | ICD-10-CM | POA: Diagnosis not present

## 2017-08-08 DIAGNOSIS — M62838 Other muscle spasm: Secondary | ICD-10-CM | POA: Diagnosis not present

## 2017-08-08 NOTE — Addendum Note (Signed)
Addended by: Vennie Homans on: 08/08/2017 03:23 PM   Modules accepted: Orders

## 2017-08-16 DIAGNOSIS — M6289 Other specified disorders of muscle: Secondary | ICD-10-CM | POA: Diagnosis not present

## 2017-08-16 DIAGNOSIS — M62838 Other muscle spasm: Secondary | ICD-10-CM | POA: Diagnosis not present

## 2017-08-16 DIAGNOSIS — M6281 Muscle weakness (generalized): Secondary | ICD-10-CM | POA: Diagnosis not present

## 2017-08-16 DIAGNOSIS — R350 Frequency of micturition: Secondary | ICD-10-CM | POA: Diagnosis not present

## 2017-08-21 DIAGNOSIS — M6289 Other specified disorders of muscle: Secondary | ICD-10-CM | POA: Diagnosis not present

## 2017-08-21 DIAGNOSIS — R102 Pelvic and perineal pain: Secondary | ICD-10-CM | POA: Diagnosis not present

## 2017-08-21 DIAGNOSIS — M6281 Muscle weakness (generalized): Secondary | ICD-10-CM | POA: Diagnosis not present

## 2017-08-21 DIAGNOSIS — M62838 Other muscle spasm: Secondary | ICD-10-CM | POA: Diagnosis not present

## 2017-08-22 ENCOUNTER — Telehealth: Payer: Self-pay | Admitting: *Deleted

## 2017-08-22 DIAGNOSIS — N926 Irregular menstruation, unspecified: Secondary | ICD-10-CM

## 2017-08-22 NOTE — Telephone Encounter (Signed)
Previous Dr.Fernandez patient) pt takes Junel FE 1/20 takes continuously. Patient has fibromyalgia and started hemp oil to help with pain, which did help, after taking 2 weeks she noticed breakthough bleeding, so she stopped hemp oil. Pt said bleeding started on 08/10/17, bleeds daily, heavy to spotting at times. Pt asked what can she do to stop the bleeding? If you prefer OV with patient let me know and I will relay to her. Please advise

## 2017-08-23 NOTE — Telephone Encounter (Signed)
Needs a visit with me with a pelvic US.  Then Annual/Gyn exam end of January.

## 2017-08-24 NOTE — Telephone Encounter (Signed)
Patient informed, will have front desk schedule. Order placed

## 2017-08-30 DIAGNOSIS — M62838 Other muscle spasm: Secondary | ICD-10-CM | POA: Diagnosis not present

## 2017-08-30 DIAGNOSIS — M6281 Muscle weakness (generalized): Secondary | ICD-10-CM | POA: Diagnosis not present

## 2017-08-30 DIAGNOSIS — R3982 Chronic bladder pain: Secondary | ICD-10-CM | POA: Diagnosis not present

## 2017-08-30 DIAGNOSIS — M6289 Other specified disorders of muscle: Secondary | ICD-10-CM | POA: Diagnosis not present

## 2017-09-05 DIAGNOSIS — M542 Cervicalgia: Secondary | ICD-10-CM | POA: Diagnosis not present

## 2017-09-05 DIAGNOSIS — G894 Chronic pain syndrome: Secondary | ICD-10-CM | POA: Diagnosis not present

## 2017-09-05 DIAGNOSIS — G588 Other specified mononeuropathies: Secondary | ICD-10-CM | POA: Diagnosis not present

## 2017-09-05 DIAGNOSIS — R1084 Generalized abdominal pain: Secondary | ICD-10-CM | POA: Diagnosis not present

## 2017-09-13 DIAGNOSIS — M6289 Other specified disorders of muscle: Secondary | ICD-10-CM | POA: Diagnosis not present

## 2017-09-13 DIAGNOSIS — Z23 Encounter for immunization: Secondary | ICD-10-CM | POA: Diagnosis not present

## 2017-09-13 DIAGNOSIS — R102 Pelvic and perineal pain: Secondary | ICD-10-CM | POA: Diagnosis not present

## 2017-09-13 DIAGNOSIS — M6281 Muscle weakness (generalized): Secondary | ICD-10-CM | POA: Diagnosis not present

## 2017-09-13 DIAGNOSIS — M62838 Other muscle spasm: Secondary | ICD-10-CM | POA: Diagnosis not present

## 2017-09-17 DIAGNOSIS — M6281 Muscle weakness (generalized): Secondary | ICD-10-CM | POA: Diagnosis not present

## 2017-09-17 DIAGNOSIS — M62838 Other muscle spasm: Secondary | ICD-10-CM | POA: Diagnosis not present

## 2017-09-17 DIAGNOSIS — R3915 Urgency of urination: Secondary | ICD-10-CM | POA: Diagnosis not present

## 2017-09-17 DIAGNOSIS — M6289 Other specified disorders of muscle: Secondary | ICD-10-CM | POA: Diagnosis not present

## 2017-09-19 ENCOUNTER — Ambulatory Visit: Payer: BLUE CROSS/BLUE SHIELD | Admitting: Obstetrics & Gynecology

## 2017-09-19 ENCOUNTER — Other Ambulatory Visit: Payer: Self-pay | Admitting: Obstetrics & Gynecology

## 2017-09-19 ENCOUNTER — Ambulatory Visit (INDEPENDENT_AMBULATORY_CARE_PROVIDER_SITE_OTHER): Payer: BLUE CROSS/BLUE SHIELD

## 2017-09-19 ENCOUNTER — Encounter: Payer: Self-pay | Admitting: Obstetrics & Gynecology

## 2017-09-19 VITALS — BP 138/84

## 2017-09-19 DIAGNOSIS — N926 Irregular menstruation, unspecified: Secondary | ICD-10-CM

## 2017-09-19 DIAGNOSIS — N83202 Unspecified ovarian cyst, left side: Secondary | ICD-10-CM | POA: Diagnosis not present

## 2017-09-19 DIAGNOSIS — Z8742 Personal history of other diseases of the female genital tract: Secondary | ICD-10-CM | POA: Diagnosis not present

## 2017-09-19 DIAGNOSIS — N921 Excessive and frequent menstruation with irregular cycle: Secondary | ICD-10-CM | POA: Diagnosis not present

## 2017-09-19 NOTE — Progress Notes (Signed)
    [name] 08-08-1971 [id number]        47 y.o.  G0 virgin  RP: Breakthrough bleeding on birth control pill for pelvic ultrasound  HPI: Well on Junel FE 1/20.  Has had mild breakthrough bleeding recently on it.  Patient has had a laparoscopy right salpingo-oophorectomy with a diagnosis of endometriosis in October 2014.  Past medical history,surgical history, problem list, medications, allergies, family history and social history were all reviewed and documented in the EPIC chart.  Directed ROS with pertinent positives and negatives documented in the history of present illness/assessment and plan.  Exam:  There were no vitals filed for this visit. General appearance:  Normal  Pelvic US today: T/V and T/A images.  Retroflexed homogeneous uterus measuring 7.95 x 4.40 x 3.77 cm.  Endometrial lining is thin and normal at 2.9 mm.  Status post right salpingo-oophorectomy.  Right adnexa is negative.  Left ovary is not visualized.  A small simple echo-free cyst is present in the left adnexa measuring 2.8 x 2.1 x 2.5 cm.  No free fluid in the posterior cul-de-sac.   Assessment/Plan:  47 y.o. G0P0   1. Breakthrough bleeding on birth control pills Pelvic ultrasound today showing a normal thin endometrial lining at 2.9 mm.  No evidence of intra-uterine lesion.  Patient reassured.  Breakthrough bleeding associated with birth control pills.  Decision to continue on same birth control pill and observe.  If worsening breakthrough bleeding, patient will call back to change to a different birth control pill.  2. History of endometriosis No evidence of recurrence.  Small simple cyst in left adnexa is asymptomatic and compatible with an ovarian follicle, no feature of endometrioma.  Continue on low-dose birth control pills.  Counseling on above issues more than 50% for 15 minutes.  47 y.o. G0P0, 4:01 PM 09/19/2017

## 2017-09-23 ENCOUNTER — Encounter: Payer: Self-pay | Admitting: Obstetrics & Gynecology

## 2017-09-23 NOTE — Patient Instructions (Signed)
1. Breakthrough bleeding on birth control pills Pelvic ultrasound today showing a normal thin endometrial lining at 2.9 mm.  No evidence of intra-uterine lesion.  Patient reassured.  Breakthrough bleeding associated with birth control pills.  Decision to continue on same birth control pill and observe.  If worsening breakthrough bleeding, patient will call back to change to a different birth control pill.  2. History of endometriosis No evidence of recurrence.  Small simple cyst in left adnexa is asymptomatic and compatible with an ovarian follicle, no feature of endometrioma.  Continue on low-dose birth control pills.  [name], it was a pleasure meeting you today!  I will see you again at your gynecologic annual exam in March 2019.

## 2017-09-27 DIAGNOSIS — M62838 Other muscle spasm: Secondary | ICD-10-CM | POA: Diagnosis not present

## 2017-09-27 DIAGNOSIS — R102 Pelvic and perineal pain: Secondary | ICD-10-CM | POA: Diagnosis not present

## 2017-09-27 DIAGNOSIS — M6281 Muscle weakness (generalized): Secondary | ICD-10-CM | POA: Diagnosis not present

## 2017-10-02 DIAGNOSIS — M6281 Muscle weakness (generalized): Secondary | ICD-10-CM | POA: Diagnosis not present

## 2017-10-02 DIAGNOSIS — M62838 Other muscle spasm: Secondary | ICD-10-CM | POA: Diagnosis not present

## 2017-10-02 DIAGNOSIS — M6289 Other specified disorders of muscle: Secondary | ICD-10-CM | POA: Diagnosis not present

## 2017-10-08 DIAGNOSIS — R1084 Generalized abdominal pain: Secondary | ICD-10-CM | POA: Diagnosis not present

## 2017-10-08 DIAGNOSIS — G588 Other specified mononeuropathies: Secondary | ICD-10-CM | POA: Diagnosis not present

## 2017-10-08 DIAGNOSIS — G894 Chronic pain syndrome: Secondary | ICD-10-CM | POA: Diagnosis not present

## 2017-10-08 DIAGNOSIS — M542 Cervicalgia: Secondary | ICD-10-CM | POA: Diagnosis not present

## 2017-10-18 DIAGNOSIS — M62838 Other muscle spasm: Secondary | ICD-10-CM | POA: Diagnosis not present

## 2017-10-18 DIAGNOSIS — M6281 Muscle weakness (generalized): Secondary | ICD-10-CM | POA: Diagnosis not present

## 2017-10-18 DIAGNOSIS — M6289 Other specified disorders of muscle: Secondary | ICD-10-CM | POA: Diagnosis not present

## 2017-10-18 DIAGNOSIS — R102 Pelvic and perineal pain: Secondary | ICD-10-CM | POA: Diagnosis not present

## 2017-10-29 DIAGNOSIS — M6281 Muscle weakness (generalized): Secondary | ICD-10-CM | POA: Diagnosis not present

## 2017-10-29 DIAGNOSIS — R3982 Chronic bladder pain: Secondary | ICD-10-CM | POA: Diagnosis not present

## 2017-10-29 DIAGNOSIS — M6289 Other specified disorders of muscle: Secondary | ICD-10-CM | POA: Diagnosis not present

## 2017-10-29 DIAGNOSIS — R102 Pelvic and perineal pain: Secondary | ICD-10-CM | POA: Diagnosis not present

## 2017-11-07 DIAGNOSIS — G588 Other specified mononeuropathies: Secondary | ICD-10-CM | POA: Diagnosis not present

## 2017-11-07 DIAGNOSIS — M542 Cervicalgia: Secondary | ICD-10-CM | POA: Diagnosis not present

## 2017-11-07 DIAGNOSIS — G894 Chronic pain syndrome: Secondary | ICD-10-CM | POA: Diagnosis not present

## 2017-11-07 DIAGNOSIS — Z5181 Encounter for therapeutic drug level monitoring: Secondary | ICD-10-CM | POA: Diagnosis not present

## 2017-11-07 DIAGNOSIS — R1084 Generalized abdominal pain: Secondary | ICD-10-CM | POA: Diagnosis not present

## 2017-11-07 DIAGNOSIS — Z79899 Other long term (current) drug therapy: Secondary | ICD-10-CM | POA: Diagnosis not present

## 2017-11-08 DIAGNOSIS — R3982 Chronic bladder pain: Secondary | ICD-10-CM | POA: Diagnosis not present

## 2017-11-08 DIAGNOSIS — M62838 Other muscle spasm: Secondary | ICD-10-CM | POA: Diagnosis not present

## 2017-11-08 DIAGNOSIS — M6289 Other specified disorders of muscle: Secondary | ICD-10-CM | POA: Diagnosis not present

## 2017-11-08 DIAGNOSIS — M6281 Muscle weakness (generalized): Secondary | ICD-10-CM | POA: Diagnosis not present

## 2017-11-15 DIAGNOSIS — M6281 Muscle weakness (generalized): Secondary | ICD-10-CM | POA: Diagnosis not present

## 2017-11-15 DIAGNOSIS — M6289 Other specified disorders of muscle: Secondary | ICD-10-CM | POA: Diagnosis not present

## 2017-11-15 DIAGNOSIS — R102 Pelvic and perineal pain: Secondary | ICD-10-CM | POA: Diagnosis not present

## 2017-11-15 DIAGNOSIS — M62838 Other muscle spasm: Secondary | ICD-10-CM | POA: Diagnosis not present

## 2017-11-22 DIAGNOSIS — N3010 Interstitial cystitis (chronic) without hematuria: Secondary | ICD-10-CM | POA: Diagnosis not present

## 2017-11-22 DIAGNOSIS — N9489 Other specified conditions associated with female genital organs and menstrual cycle: Secondary | ICD-10-CM | POA: Diagnosis not present

## 2017-11-22 DIAGNOSIS — M6281 Muscle weakness (generalized): Secondary | ICD-10-CM | POA: Diagnosis not present

## 2017-11-22 DIAGNOSIS — R3915 Urgency of urination: Secondary | ICD-10-CM | POA: Diagnosis not present

## 2017-11-22 DIAGNOSIS — M62838 Other muscle spasm: Secondary | ICD-10-CM | POA: Diagnosis not present

## 2017-11-22 DIAGNOSIS — M6289 Other specified disorders of muscle: Secondary | ICD-10-CM | POA: Diagnosis not present

## 2017-11-29 DIAGNOSIS — M6289 Other specified disorders of muscle: Secondary | ICD-10-CM | POA: Diagnosis not present

## 2017-11-29 DIAGNOSIS — M62838 Other muscle spasm: Secondary | ICD-10-CM | POA: Diagnosis not present

## 2017-11-29 DIAGNOSIS — R102 Pelvic and perineal pain: Secondary | ICD-10-CM | POA: Diagnosis not present

## 2017-11-29 DIAGNOSIS — M6281 Muscle weakness (generalized): Secondary | ICD-10-CM | POA: Diagnosis not present

## 2017-12-03 ENCOUNTER — Encounter: Payer: BLUE CROSS/BLUE SHIELD | Admitting: Obstetrics & Gynecology

## 2017-12-04 DIAGNOSIS — R1084 Generalized abdominal pain: Secondary | ICD-10-CM | POA: Diagnosis not present

## 2017-12-04 DIAGNOSIS — G894 Chronic pain syndrome: Secondary | ICD-10-CM | POA: Diagnosis not present

## 2017-12-04 DIAGNOSIS — G588 Other specified mononeuropathies: Secondary | ICD-10-CM | POA: Diagnosis not present

## 2017-12-04 DIAGNOSIS — M542 Cervicalgia: Secondary | ICD-10-CM | POA: Diagnosis not present

## 2017-12-06 DIAGNOSIS — R102 Pelvic and perineal pain: Secondary | ICD-10-CM | POA: Diagnosis not present

## 2017-12-06 DIAGNOSIS — M6281 Muscle weakness (generalized): Secondary | ICD-10-CM | POA: Diagnosis not present

## 2017-12-06 DIAGNOSIS — M62838 Other muscle spasm: Secondary | ICD-10-CM | POA: Diagnosis not present

## 2017-12-06 DIAGNOSIS — M6289 Other specified disorders of muscle: Secondary | ICD-10-CM | POA: Diagnosis not present

## 2017-12-11 DIAGNOSIS — N390 Urinary tract infection, site not specified: Secondary | ICD-10-CM | POA: Diagnosis not present

## 2017-12-11 DIAGNOSIS — R7303 Prediabetes: Secondary | ICD-10-CM | POA: Diagnosis not present

## 2017-12-11 DIAGNOSIS — E039 Hypothyroidism, unspecified: Secondary | ICD-10-CM | POA: Diagnosis not present

## 2017-12-11 DIAGNOSIS — I10 Essential (primary) hypertension: Secondary | ICD-10-CM | POA: Diagnosis not present

## 2017-12-11 DIAGNOSIS — E559 Vitamin D deficiency, unspecified: Secondary | ICD-10-CM | POA: Diagnosis not present

## 2017-12-13 DIAGNOSIS — M62838 Other muscle spasm: Secondary | ICD-10-CM | POA: Diagnosis not present

## 2017-12-13 DIAGNOSIS — M6281 Muscle weakness (generalized): Secondary | ICD-10-CM | POA: Diagnosis not present

## 2017-12-13 DIAGNOSIS — M6289 Other specified disorders of muscle: Secondary | ICD-10-CM | POA: Diagnosis not present

## 2017-12-13 DIAGNOSIS — R3982 Chronic bladder pain: Secondary | ICD-10-CM | POA: Diagnosis not present

## 2017-12-17 DIAGNOSIS — E559 Vitamin D deficiency, unspecified: Secondary | ICD-10-CM | POA: Diagnosis not present

## 2017-12-17 DIAGNOSIS — Z0000 Encounter for general adult medical examination without abnormal findings: Secondary | ICD-10-CM | POA: Diagnosis not present

## 2017-12-18 DIAGNOSIS — M6289 Other specified disorders of muscle: Secondary | ICD-10-CM | POA: Diagnosis not present

## 2017-12-18 DIAGNOSIS — R102 Pelvic and perineal pain: Secondary | ICD-10-CM | POA: Diagnosis not present

## 2017-12-18 DIAGNOSIS — M6281 Muscle weakness (generalized): Secondary | ICD-10-CM | POA: Diagnosis not present

## 2017-12-18 DIAGNOSIS — M62838 Other muscle spasm: Secondary | ICD-10-CM | POA: Diagnosis not present

## 2017-12-20 ENCOUNTER — Other Ambulatory Visit: Payer: Self-pay

## 2017-12-20 MED ORDER — NORETHIN ACE-ETH ESTRAD-FE 1-20 MG-MCG PO TABS: 1.0000 | ORAL_TABLET | Freq: Every day | ORAL | 0 refills | Status: DC

## 2017-12-20 NOTE — Telephone Encounter (Signed)
Has CE scheduled in July.

## 2017-12-26 DIAGNOSIS — G588 Other specified mononeuropathies: Secondary | ICD-10-CM | POA: Diagnosis not present

## 2017-12-26 DIAGNOSIS — M542 Cervicalgia: Secondary | ICD-10-CM | POA: Diagnosis not present

## 2017-12-26 DIAGNOSIS — G894 Chronic pain syndrome: Secondary | ICD-10-CM | POA: Diagnosis not present

## 2017-12-26 DIAGNOSIS — R1084 Generalized abdominal pain: Secondary | ICD-10-CM | POA: Diagnosis not present

## 2017-12-27 DIAGNOSIS — M6289 Other specified disorders of muscle: Secondary | ICD-10-CM | POA: Diagnosis not present

## 2017-12-27 DIAGNOSIS — R102 Pelvic and perineal pain: Secondary | ICD-10-CM | POA: Diagnosis not present

## 2017-12-27 DIAGNOSIS — M6281 Muscle weakness (generalized): Secondary | ICD-10-CM | POA: Diagnosis not present

## 2017-12-27 DIAGNOSIS — M62838 Other muscle spasm: Secondary | ICD-10-CM | POA: Diagnosis not present

## 2018-01-08 DIAGNOSIS — G588 Other specified mononeuropathies: Secondary | ICD-10-CM | POA: Diagnosis not present

## 2018-01-08 DIAGNOSIS — G894 Chronic pain syndrome: Secondary | ICD-10-CM | POA: Diagnosis not present

## 2018-01-08 DIAGNOSIS — M542 Cervicalgia: Secondary | ICD-10-CM | POA: Diagnosis not present

## 2018-01-08 DIAGNOSIS — R1084 Generalized abdominal pain: Secondary | ICD-10-CM | POA: Diagnosis not present

## 2018-01-10 DIAGNOSIS — M6281 Muscle weakness (generalized): Secondary | ICD-10-CM | POA: Diagnosis not present

## 2018-01-10 DIAGNOSIS — R102 Pelvic and perineal pain: Secondary | ICD-10-CM | POA: Diagnosis not present

## 2018-01-10 DIAGNOSIS — M62838 Other muscle spasm: Secondary | ICD-10-CM | POA: Diagnosis not present

## 2018-01-10 DIAGNOSIS — M6289 Other specified disorders of muscle: Secondary | ICD-10-CM | POA: Diagnosis not present

## 2018-02-12 DIAGNOSIS — R1084 Generalized abdominal pain: Secondary | ICD-10-CM | POA: Diagnosis not present

## 2018-02-12 DIAGNOSIS — G894 Chronic pain syndrome: Secondary | ICD-10-CM | POA: Diagnosis not present

## 2018-02-12 DIAGNOSIS — Z79899 Other long term (current) drug therapy: Secondary | ICD-10-CM | POA: Diagnosis not present

## 2018-02-12 DIAGNOSIS — M542 Cervicalgia: Secondary | ICD-10-CM | POA: Diagnosis not present

## 2018-02-12 DIAGNOSIS — G588 Other specified mononeuropathies: Secondary | ICD-10-CM | POA: Diagnosis not present

## 2018-02-12 DIAGNOSIS — Z5181 Encounter for therapeutic drug level monitoring: Secondary | ICD-10-CM | POA: Diagnosis not present

## 2018-02-14 DIAGNOSIS — M6281 Muscle weakness (generalized): Secondary | ICD-10-CM | POA: Diagnosis not present

## 2018-02-14 DIAGNOSIS — M6289 Other specified disorders of muscle: Secondary | ICD-10-CM | POA: Diagnosis not present

## 2018-02-14 DIAGNOSIS — R102 Pelvic and perineal pain: Secondary | ICD-10-CM | POA: Diagnosis not present

## 2018-02-14 DIAGNOSIS — M62838 Other muscle spasm: Secondary | ICD-10-CM | POA: Diagnosis not present

## 2018-02-18 DIAGNOSIS — R1084 Generalized abdominal pain: Secondary | ICD-10-CM | POA: Diagnosis not present

## 2018-02-18 DIAGNOSIS — M542 Cervicalgia: Secondary | ICD-10-CM | POA: Diagnosis not present

## 2018-02-18 DIAGNOSIS — G588 Other specified mononeuropathies: Secondary | ICD-10-CM | POA: Diagnosis not present

## 2018-02-18 DIAGNOSIS — G894 Chronic pain syndrome: Secondary | ICD-10-CM | POA: Diagnosis not present

## 2018-02-25 DIAGNOSIS — E569 Vitamin deficiency, unspecified: Secondary | ICD-10-CM | POA: Diagnosis not present

## 2018-02-25 DIAGNOSIS — E559 Vitamin D deficiency, unspecified: Secondary | ICD-10-CM | POA: Diagnosis not present

## 2018-02-25 DIAGNOSIS — R799 Abnormal finding of blood chemistry, unspecified: Secondary | ICD-10-CM | POA: Diagnosis not present

## 2018-02-25 DIAGNOSIS — E039 Hypothyroidism, unspecified: Secondary | ICD-10-CM | POA: Diagnosis not present

## 2018-02-25 DIAGNOSIS — Z139 Encounter for screening, unspecified: Secondary | ICD-10-CM | POA: Diagnosis not present

## 2018-02-25 DIAGNOSIS — R5382 Chronic fatigue, unspecified: Secondary | ICD-10-CM | POA: Diagnosis not present

## 2018-02-25 DIAGNOSIS — D510 Vitamin B12 deficiency anemia due to intrinsic factor deficiency: Secondary | ICD-10-CM | POA: Diagnosis not present

## 2018-02-28 DIAGNOSIS — M6289 Other specified disorders of muscle: Secondary | ICD-10-CM | POA: Diagnosis not present

## 2018-02-28 DIAGNOSIS — R102 Pelvic and perineal pain: Secondary | ICD-10-CM | POA: Diagnosis not present

## 2018-02-28 DIAGNOSIS — M6281 Muscle weakness (generalized): Secondary | ICD-10-CM | POA: Diagnosis not present

## 2018-03-05 DIAGNOSIS — M542 Cervicalgia: Secondary | ICD-10-CM | POA: Diagnosis not present

## 2018-03-05 DIAGNOSIS — G894 Chronic pain syndrome: Secondary | ICD-10-CM | POA: Diagnosis not present

## 2018-03-05 DIAGNOSIS — R1084 Generalized abdominal pain: Secondary | ICD-10-CM | POA: Diagnosis not present

## 2018-03-05 DIAGNOSIS — G588 Other specified mononeuropathies: Secondary | ICD-10-CM | POA: Diagnosis not present

## 2018-03-12 ENCOUNTER — Encounter: Payer: BLUE CROSS/BLUE SHIELD | Admitting: Obstetrics & Gynecology

## 2018-03-14 DIAGNOSIS — M6289 Other specified disorders of muscle: Secondary | ICD-10-CM | POA: Diagnosis not present

## 2018-03-14 DIAGNOSIS — M62838 Other muscle spasm: Secondary | ICD-10-CM | POA: Diagnosis not present

## 2018-03-14 DIAGNOSIS — R102 Pelvic and perineal pain: Secondary | ICD-10-CM | POA: Diagnosis not present

## 2018-03-14 DIAGNOSIS — M6281 Muscle weakness (generalized): Secondary | ICD-10-CM | POA: Diagnosis not present

## 2018-03-17 ENCOUNTER — Other Ambulatory Visit: Payer: Self-pay | Admitting: Obstetrics & Gynecology

## 2018-03-21 DIAGNOSIS — R3982 Chronic bladder pain: Secondary | ICD-10-CM | POA: Diagnosis not present

## 2018-03-21 DIAGNOSIS — M6281 Muscle weakness (generalized): Secondary | ICD-10-CM | POA: Diagnosis not present

## 2018-03-21 DIAGNOSIS — M6289 Other specified disorders of muscle: Secondary | ICD-10-CM | POA: Diagnosis not present

## 2018-03-25 DIAGNOSIS — G894 Chronic pain syndrome: Secondary | ICD-10-CM | POA: Diagnosis not present

## 2018-03-25 DIAGNOSIS — R1084 Generalized abdominal pain: Secondary | ICD-10-CM | POA: Diagnosis not present

## 2018-03-25 DIAGNOSIS — G588 Other specified mononeuropathies: Secondary | ICD-10-CM | POA: Diagnosis not present

## 2018-03-25 DIAGNOSIS — M542 Cervicalgia: Secondary | ICD-10-CM | POA: Diagnosis not present

## 2018-03-28 DIAGNOSIS — M62838 Other muscle spasm: Secondary | ICD-10-CM | POA: Diagnosis not present

## 2018-03-28 DIAGNOSIS — M6281 Muscle weakness (generalized): Secondary | ICD-10-CM | POA: Diagnosis not present

## 2018-03-28 DIAGNOSIS — M6289 Other specified disorders of muscle: Secondary | ICD-10-CM | POA: Diagnosis not present

## 2018-04-11 DIAGNOSIS — M6281 Muscle weakness (generalized): Secondary | ICD-10-CM | POA: Diagnosis not present

## 2018-04-11 DIAGNOSIS — M62838 Other muscle spasm: Secondary | ICD-10-CM | POA: Diagnosis not present

## 2018-04-11 DIAGNOSIS — M6289 Other specified disorders of muscle: Secondary | ICD-10-CM | POA: Diagnosis not present

## 2018-04-18 DIAGNOSIS — M6281 Muscle weakness (generalized): Secondary | ICD-10-CM | POA: Diagnosis not present

## 2018-04-18 DIAGNOSIS — R350 Frequency of micturition: Secondary | ICD-10-CM | POA: Diagnosis not present

## 2018-04-18 DIAGNOSIS — M62838 Other muscle spasm: Secondary | ICD-10-CM | POA: Diagnosis not present

## 2018-04-18 DIAGNOSIS — M6289 Other specified disorders of muscle: Secondary | ICD-10-CM | POA: Diagnosis not present

## 2018-04-29 ENCOUNTER — Other Ambulatory Visit: Payer: Self-pay | Admitting: Cardiovascular Disease

## 2018-05-09 DIAGNOSIS — M6281 Muscle weakness (generalized): Secondary | ICD-10-CM | POA: Diagnosis not present

## 2018-05-09 DIAGNOSIS — M6289 Other specified disorders of muscle: Secondary | ICD-10-CM | POA: Diagnosis not present

## 2018-05-09 DIAGNOSIS — M62838 Other muscle spasm: Secondary | ICD-10-CM | POA: Diagnosis not present

## 2018-05-09 DIAGNOSIS — R102 Pelvic and perineal pain: Secondary | ICD-10-CM | POA: Diagnosis not present

## 2018-05-15 DIAGNOSIS — R3982 Chronic bladder pain: Secondary | ICD-10-CM | POA: Diagnosis not present

## 2018-05-15 DIAGNOSIS — M6281 Muscle weakness (generalized): Secondary | ICD-10-CM | POA: Diagnosis not present

## 2018-05-15 DIAGNOSIS — R102 Pelvic and perineal pain: Secondary | ICD-10-CM | POA: Diagnosis not present

## 2018-05-15 DIAGNOSIS — M62838 Other muscle spasm: Secondary | ICD-10-CM | POA: Diagnosis not present

## 2018-05-20 DIAGNOSIS — R1084 Generalized abdominal pain: Secondary | ICD-10-CM | POA: Diagnosis not present

## 2018-05-20 DIAGNOSIS — Z5181 Encounter for therapeutic drug level monitoring: Secondary | ICD-10-CM | POA: Diagnosis not present

## 2018-05-20 DIAGNOSIS — M542 Cervicalgia: Secondary | ICD-10-CM | POA: Diagnosis not present

## 2018-05-20 DIAGNOSIS — Z79899 Other long term (current) drug therapy: Secondary | ICD-10-CM | POA: Diagnosis not present

## 2018-05-20 DIAGNOSIS — G894 Chronic pain syndrome: Secondary | ICD-10-CM | POA: Diagnosis not present

## 2018-05-23 DIAGNOSIS — M6281 Muscle weakness (generalized): Secondary | ICD-10-CM | POA: Diagnosis not present

## 2018-05-23 DIAGNOSIS — N9489 Other specified conditions associated with female genital organs and menstrual cycle: Secondary | ICD-10-CM | POA: Diagnosis not present

## 2018-05-23 DIAGNOSIS — G894 Chronic pain syndrome: Secondary | ICD-10-CM | POA: Diagnosis not present

## 2018-05-23 DIAGNOSIS — M62838 Other muscle spasm: Secondary | ICD-10-CM | POA: Diagnosis not present

## 2018-05-23 DIAGNOSIS — R82998 Other abnormal findings in urine: Secondary | ICD-10-CM | POA: Diagnosis not present

## 2018-05-23 DIAGNOSIS — M6289 Other specified disorders of muscle: Secondary | ICD-10-CM | POA: Diagnosis not present

## 2018-05-23 DIAGNOSIS — R102 Pelvic and perineal pain: Secondary | ICD-10-CM | POA: Diagnosis not present

## 2018-05-23 DIAGNOSIS — N3010 Interstitial cystitis (chronic) without hematuria: Secondary | ICD-10-CM | POA: Diagnosis not present

## 2018-05-27 DIAGNOSIS — R350 Frequency of micturition: Secondary | ICD-10-CM | POA: Diagnosis not present

## 2018-05-27 DIAGNOSIS — M62838 Other muscle spasm: Secondary | ICD-10-CM | POA: Diagnosis not present

## 2018-05-27 DIAGNOSIS — M6281 Muscle weakness (generalized): Secondary | ICD-10-CM | POA: Diagnosis not present

## 2018-05-30 ENCOUNTER — Other Ambulatory Visit: Payer: Self-pay | Admitting: Obstetrics & Gynecology

## 2018-05-30 DIAGNOSIS — Z1231 Encounter for screening mammogram for malignant neoplasm of breast: Secondary | ICD-10-CM

## 2018-06-01 ENCOUNTER — Other Ambulatory Visit: Payer: Self-pay | Admitting: Obstetrics & Gynecology

## 2018-06-06 DIAGNOSIS — M6289 Other specified disorders of muscle: Secondary | ICD-10-CM | POA: Diagnosis not present

## 2018-06-06 DIAGNOSIS — R102 Pelvic and perineal pain: Secondary | ICD-10-CM | POA: Diagnosis not present

## 2018-06-06 DIAGNOSIS — M6281 Muscle weakness (generalized): Secondary | ICD-10-CM | POA: Diagnosis not present

## 2018-06-06 DIAGNOSIS — M62838 Other muscle spasm: Secondary | ICD-10-CM | POA: Diagnosis not present

## 2018-06-12 ENCOUNTER — Ambulatory Visit: Payer: BLUE CROSS/BLUE SHIELD | Admitting: Obstetrics & Gynecology

## 2018-06-12 ENCOUNTER — Encounter: Payer: Self-pay | Admitting: Obstetrics & Gynecology

## 2018-06-12 VITALS — BP 130/84 | Ht 62.0 in | Wt 206.0 lb

## 2018-06-12 DIAGNOSIS — N3010 Interstitial cystitis (chronic) without hematuria: Secondary | ICD-10-CM | POA: Diagnosis not present

## 2018-06-12 DIAGNOSIS — Z01419 Encounter for gynecological examination (general) (routine) without abnormal findings: Secondary | ICD-10-CM | POA: Diagnosis not present

## 2018-06-12 DIAGNOSIS — Z3041 Encounter for surveillance of contraceptive pills: Secondary | ICD-10-CM

## 2018-06-12 DIAGNOSIS — E6609 Other obesity due to excess calories: Secondary | ICD-10-CM

## 2018-06-12 DIAGNOSIS — Z6837 Body mass index (BMI) 37.0-37.9, adult: Secondary | ICD-10-CM

## 2018-06-12 MED ORDER — NORETHIN ACE-ETH ESTRAD-FE 1-20 MG-MCG PO TABS: 1.0000 | ORAL_TABLET | Freq: Every day | ORAL | 4 refills | Status: DC

## 2018-06-12 NOTE — Progress Notes (Signed)
[name] 02/24/1971 [id number]   History:    47 y.o. G0 single  RP:  Established patient presenting for annual gyn exam   HPI: Well on Janel 1/20 FE.  No longer having breakthrough bleeding.  No pelvic pain.  Normal vaginal secretions.  [place].  Interstitial cystitis managed with urologist.  Bowel movements normal.  Breasts normal.  Body mass index 37.68.  Health labs with family physician.  Past medical history,surgical history, family history and social history were all reviewed and documented in the EPIC chart.  Gynecologic History No LMP recorded. (Menstrual status: Oral contraceptives). Contraception: abstinence and OCP (estrogen/progesterone) Last Pap: 11/2016. Results were: Negative Last mammogram: 10/2015. Results were: Negative Bone Density: Never Colonoscopy: Never  Obstetric History OB History  Gravida Para Term Preterm AB Living  0            SAB TAB Ectopic Multiple Live Births                ROS: A ROS was performed and pertinent positives and negatives are included in the history.  GENERAL: No fevers or chills. HEENT: No change in vision, no earache, sore throat or sinus congestion. NECK: No pain or stiffness. CARDIOVASCULAR: No chest pain or pressure. No palpitations. PULMONARY: No shortness of breath, cough or wheeze. GASTROINTESTINAL: No abdominal pain, nausea, vomiting or diarrhea, melena or bright red blood per rectum. GENITOURINARY: No urinary frequency, urgency, hesitancy or dysuria. MUSCULOSKELETAL: No joint or muscle pain, no back pain, no recent trauma. DERMATOLOGIC: No rash, no itching, no lesions. ENDOCRINE: No polyuria, polydipsia, no heat or cold intolerance. No recent change in weight. HEMATOLOGICAL: No anemia or easy bruising or bleeding. NEUROLOGIC: No headache, seizures, numbness, tingling or weakness. PSYCHIATRIC: No depression, no loss of interest in normal activity or change in sleep pattern.     Exam:   BP 130/84   Ht 5\' 2"  (1.575 m)    Wt 206 lb (93.4 kg)   BMI 37.68 kg/m   Body mass index is 37.68 kg/m.  General appearance : Well developed well nourished female. No acute distress HEENT: Eyes: no retinal hemorrhage or exudates,  Neck supple, trachea midline, no carotid bruits, no thyroidmegaly Lungs: Clear to auscultation, no rhonchi or wheezes, or rib retractions  Heart: Regular rate and rhythm, no murmurs or gallops Breast:Examined in sitting and supine position were symmetrical in appearance, no palpable masses or tenderness,  no skin retraction, no nipple inversion, no nipple discharge, no skin discoloration, no axillary or supraclavicular lymphadenopathy Abdomen: no palpable masses or tenderness, no rebound or guarding Extremities: no edema or skin discoloration or tenderness  Pelvic: Vulva: Normal             Vagina: No gross lesions or discharge  Cervix: No gross lesions or discharge  Uterus  AV, normal size, shape and consistency, non-tender and mobile  Adnexa  Without masses or tenderness  Anus: Normal   Assessment/Plan:  47 y.o. female for annual exam   1. Well female exam with routine gynecological exam Normal gynecologic exam.  Pap test negative in March 2018.  No indication to repeat the Pap test this year, especially given that the patient is a virgin.  Breast exam normal.  Will schedule screening mammogram now.  Health labs with family physician.   2. Encounter for surveillance of contraceptive pills Well on Janel 1/20 FE.  No contraindication to birth control pills.  Prescription sent to pharmacy.  3. INTERSTITIAL CYSTITIS Managed by urologist.  4. Class 2 obesity due to excess calories without serious comorbidity with body mass index (BMI) of 37.0 to 37.9 in adult Recommend lower calorie/carb diet such as [employer].  Aerobic physical activity 5 times a week and weightlifting every 2 days.  Other orders - norethindrone-ethinyl estradiol (JUNEL FE 1/20) 1-20 MG-MCG tablet; Take 1 tablet  by mouth daily.  Princess Bruins MD, 3:24 PM 06/12/2018

## 2018-06-13 ENCOUNTER — Encounter: Payer: Self-pay | Admitting: Obstetrics & Gynecology

## 2018-06-13 DIAGNOSIS — M6289 Other specified disorders of muscle: Secondary | ICD-10-CM | POA: Diagnosis not present

## 2018-06-13 DIAGNOSIS — M62838 Other muscle spasm: Secondary | ICD-10-CM | POA: Diagnosis not present

## 2018-06-13 DIAGNOSIS — N3010 Interstitial cystitis (chronic) without hematuria: Secondary | ICD-10-CM | POA: Diagnosis not present

## 2018-06-13 DIAGNOSIS — M6281 Muscle weakness (generalized): Secondary | ICD-10-CM | POA: Diagnosis not present

## 2018-06-13 NOTE — Patient Instructions (Signed)
1. Well female exam with routine gynecological exam Normal gynecologic exam.  Pap test negative in March 2018.  No indication to repeat the Pap test this year, especially given that the patient is a virgin.  Breast exam normal.  Will schedule screening mammogram now.  Health labs with family physician.   2. Encounter for surveillance of contraceptive pills Well on Janel 1/20 FE.  No contraindication to birth control pills.  Prescription sent to pharmacy.  3. INTERSTITIAL CYSTITIS Managed by urologist.  4. Class 2 obesity due to excess calories without serious comorbidity with body mass index (BMI) of 37.0 to 37.9 in adult Recommend lower calorie/carb diet such as [employer].  Aerobic physical activity 5 times a week and weightlifting every 2 days.  Other orders - norethindrone-ethinyl estradiol (JUNEL FE 1/20) 1-20 MG-MCG tablet; Take 1 tablet by mouth daily.  [name], it was a pleasure seeing you today!

## 2018-06-18 DIAGNOSIS — E559 Vitamin D deficiency, unspecified: Secondary | ICD-10-CM | POA: Diagnosis not present

## 2018-06-18 DIAGNOSIS — I10 Essential (primary) hypertension: Secondary | ICD-10-CM | POA: Diagnosis not present

## 2018-06-18 DIAGNOSIS — Z23 Encounter for immunization: Secondary | ICD-10-CM | POA: Diagnosis not present

## 2018-06-19 ENCOUNTER — Ambulatory Visit
Admission: RE | Admit: 2018-06-19 | Discharge: 2018-06-19 | Disposition: A | Discharge status: Home / Self Care | Payer: BLUE CROSS/BLUE SHIELD | Source: Ambulatory Visit | Attending: Obstetrics & Gynecology | Admitting: Obstetrics & Gynecology

## 2018-06-19 DIAGNOSIS — Z1231 Encounter for screening mammogram for malignant neoplasm of breast: Secondary | ICD-10-CM

## 2018-06-20 DIAGNOSIS — M62838 Other muscle spasm: Secondary | ICD-10-CM | POA: Diagnosis not present

## 2018-06-20 DIAGNOSIS — R3982 Chronic bladder pain: Secondary | ICD-10-CM | POA: Diagnosis not present

## 2018-06-20 DIAGNOSIS — R102 Pelvic and perineal pain: Secondary | ICD-10-CM | POA: Diagnosis not present

## 2018-06-20 DIAGNOSIS — M6281 Muscle weakness (generalized): Secondary | ICD-10-CM | POA: Diagnosis not present

## 2018-07-04 DIAGNOSIS — R3982 Chronic bladder pain: Secondary | ICD-10-CM | POA: Diagnosis not present

## 2018-07-04 DIAGNOSIS — R102 Pelvic and perineal pain: Secondary | ICD-10-CM | POA: Diagnosis not present

## 2018-07-04 DIAGNOSIS — M6281 Muscle weakness (generalized): Secondary | ICD-10-CM | POA: Diagnosis not present

## 2018-07-10 DIAGNOSIS — G894 Chronic pain syndrome: Secondary | ICD-10-CM | POA: Diagnosis not present

## 2018-07-10 DIAGNOSIS — M542 Cervicalgia: Secondary | ICD-10-CM | POA: Diagnosis not present

## 2018-07-10 DIAGNOSIS — R1084 Generalized abdominal pain: Secondary | ICD-10-CM | POA: Diagnosis not present

## 2018-07-17 DIAGNOSIS — M6281 Muscle weakness (generalized): Secondary | ICD-10-CM | POA: Diagnosis not present

## 2018-07-17 DIAGNOSIS — M62838 Other muscle spasm: Secondary | ICD-10-CM | POA: Diagnosis not present

## 2018-07-17 DIAGNOSIS — R102 Pelvic and perineal pain: Secondary | ICD-10-CM | POA: Diagnosis not present

## 2018-07-17 DIAGNOSIS — R3982 Chronic bladder pain: Secondary | ICD-10-CM | POA: Diagnosis not present

## 2018-07-25 DIAGNOSIS — R3982 Chronic bladder pain: Secondary | ICD-10-CM | POA: Diagnosis not present

## 2018-07-25 DIAGNOSIS — M62838 Other muscle spasm: Secondary | ICD-10-CM | POA: Diagnosis not present

## 2018-07-25 DIAGNOSIS — M6289 Other specified disorders of muscle: Secondary | ICD-10-CM | POA: Diagnosis not present

## 2018-07-25 DIAGNOSIS — M6281 Muscle weakness (generalized): Secondary | ICD-10-CM | POA: Diagnosis not present

## 2018-07-31 DIAGNOSIS — R102 Pelvic and perineal pain: Secondary | ICD-10-CM | POA: Diagnosis not present

## 2018-07-31 DIAGNOSIS — M62838 Other muscle spasm: Secondary | ICD-10-CM | POA: Diagnosis not present

## 2018-07-31 DIAGNOSIS — R3982 Chronic bladder pain: Secondary | ICD-10-CM | POA: Diagnosis not present

## 2018-07-31 DIAGNOSIS — M6281 Muscle weakness (generalized): Secondary | ICD-10-CM | POA: Diagnosis not present

## 2018-08-08 DIAGNOSIS — M6289 Other specified disorders of muscle: Secondary | ICD-10-CM | POA: Diagnosis not present

## 2018-08-08 DIAGNOSIS — M62838 Other muscle spasm: Secondary | ICD-10-CM | POA: Diagnosis not present

## 2018-08-08 DIAGNOSIS — R102 Pelvic and perineal pain: Secondary | ICD-10-CM | POA: Diagnosis not present

## 2018-08-08 DIAGNOSIS — M6281 Muscle weakness (generalized): Secondary | ICD-10-CM | POA: Diagnosis not present

## 2018-08-26 DIAGNOSIS — R102 Pelvic and perineal pain: Secondary | ICD-10-CM | POA: Diagnosis not present

## 2018-08-26 DIAGNOSIS — M62838 Other muscle spasm: Secondary | ICD-10-CM | POA: Diagnosis not present

## 2018-08-26 DIAGNOSIS — M6281 Muscle weakness (generalized): Secondary | ICD-10-CM | POA: Diagnosis not present

## 2018-08-26 DIAGNOSIS — M6289 Other specified disorders of muscle: Secondary | ICD-10-CM | POA: Diagnosis not present

## 2018-09-09 DIAGNOSIS — M542 Cervicalgia: Secondary | ICD-10-CM | POA: Diagnosis not present

## 2018-09-09 DIAGNOSIS — G894 Chronic pain syndrome: Secondary | ICD-10-CM | POA: Diagnosis not present

## 2018-09-09 DIAGNOSIS — Z79899 Other long term (current) drug therapy: Secondary | ICD-10-CM | POA: Diagnosis not present

## 2018-09-09 DIAGNOSIS — Z5181 Encounter for therapeutic drug level monitoring: Secondary | ICD-10-CM | POA: Diagnosis not present

## 2018-09-09 DIAGNOSIS — R1084 Generalized abdominal pain: Secondary | ICD-10-CM | POA: Diagnosis not present

## 2018-09-19 ENCOUNTER — Ambulatory Visit (INDEPENDENT_AMBULATORY_CARE_PROVIDER_SITE_OTHER): Payer: Self-pay

## 2018-09-19 ENCOUNTER — Encounter (INDEPENDENT_AMBULATORY_CARE_PROVIDER_SITE_OTHER): Payer: Self-pay | Admitting: Physician Assistant

## 2018-09-19 ENCOUNTER — Ambulatory Visit (INDEPENDENT_AMBULATORY_CARE_PROVIDER_SITE_OTHER): Payer: Self-pay | Admitting: Physician Assistant

## 2018-09-19 DIAGNOSIS — M542 Cervicalgia: Secondary | ICD-10-CM

## 2018-09-19 MED ORDER — METHYLPREDNISOLONE 4 MG PO TABS: ORAL_TABLET | ORAL | 0 refills | Status: DC

## 2018-09-19 MED ORDER — METHOCARBAMOL 500 MG PO TABS: 500.0000 mg | ORAL_TABLET | Freq: Three times a day (TID) | ORAL | 1 refills | Status: DC

## 2018-09-19 NOTE — Progress Notes (Addendum)
Office Visit Note   Patient: [name]           Date of Birth: 1970/11/13           MRN: [id number] Visit Date: 09/19/2018              Requested by: Deland Pretty, MD [address] [place], [place] [address] PCP: Deland Pretty, MD   Assessment & Plan: Visit Diagnoses:  1. Cervicalgia     Plan: She is given handouts on cervical spine exercises which she will do on her own at home.  Placed her on a Medrol Dosepak and Robaxin.  Moist heat to neck.  Follow-up with [place] in 2 weeks check her response to these treatments.  If her radicular symptoms down the arm continues recommend MRI of the C-spine rule out HNP as source of her pain.No NSAIDS while on the medrol dose pack.   Follow-Up Instructions: Return in about 2 weeks (around 10/03/2018).   Orders:  Orders Placed This Encounter  Procedures  . XR Cervical Spine 2 or 3 views   Meds ordered this encounter  Medications  . methylPREDNISolone (MEDROL) 4 MG tablet    Sig: Take as directed    Dispense:  21 tablet    Refill:  0  . methocarbamol (ROBAXIN) 500 MG tablet    Sig: Take 1 tablet (500 mg total) by mouth 3 (three) times daily.    Dispense:  40 tablet    Refill:  1      Procedures: No procedures performed   Clinical Data: No additional findings.   Subjective: Chief Complaint  Patient presents with  . Neck - Pain    HPI [name] comes in today for new complaint of neck and shoulder pain on the right.  She has a history of arthrofibrosis of the right shoulder which resolved.  She reports that a bowl attacked her and her dog back in September and that she was straining her neck back trying to get the dog off of her.  Initially had some pain in her neck but this resolved with a chiropractor.  Then she was helping a sick family member shower and while trying to hold him up reinjured her neck in December before Christmas.  She says she felt a strain in her neck and in her shoulder at that time.  Now she  is having numbness and tingling down the right arm mainly goes into the thumb.  She describes it as being on fire at times.  She notes that she is unable to bring her thumb to her fifth finger on the right and this is new.  Review of Systems Please see the HPI.  Objective: Vital Signs: There were no vitals taken for this visit.  Physical Exam Constitutional:      Appearance: She is not ill-appearing or diaphoretic.  Neurological:     Mental Status: She is alert and oriented to person, place, and time.  Psychiatric:        Mood and Affect: Mood normal.     Ortho Exam Upper extremity strength testing throughout is 5 out of 5.  She has 2+ reflexes at the biceps, triceps and brachial radialis bilaterally.  Sensation decreased median distribution of the right hand.  Full motor of the hands bilaterally except for being able to touch the thumb completely to the fifth finger on the right hand.  Grip strengths are equal and symmetric.  Full range of motion bilateral shoulders without  pain.  She has a positive Spurling's.  Tenderness over the medial border of the right scapula.  Good flexion-extension cervical spine.  Positive Tinel's in both wrist.  Negative compression over the median nerve on the left positive on the right.  Specialty Comments:  No specialty comments available.  Imaging: Xr Cervical Spine 2 Or 3 Views  Result Date: 09/19/2018 3 views of the cervical spine: No acute fractures did space well maintained.  No spondylolisthesis.  Loss of lordotic curvature is noted.    PMFS History: Patient Active Problem List   Diagnosis Date Noted  . Personal history of endometriosis 11/30/2016  . Morbid obesity ([place]) 07/03/2014  . Dyspnea 07/02/2014  . Clostridium difficile infection   . GASTROPARESIS 06/15/2009  . INSOMNIA 05/12/2009  . EPIGASTRIC PAIN 04/15/2009  . INFECTIOUS MONONUCLEOSIS 03/10/2009  . TACHYCARDIA 03/10/2009  . ADENOMATOUS COLONIC POLYP 12/05/2007  . FUNGAL  DERMATITIS 11/30/2006  . HYPERLIPIDEMIA 11/30/2006  . PANIC DISORDER 11/30/2006  . ASTHMA 11/30/2006  . GERD 11/30/2006  . IRRITABLE BOWEL SYNDROME 11/30/2006  . INTERSTITIAL CYSTITIS 11/30/2006  . MYOFASCIAL PAIN SYNDROME 11/30/2006  . CHRONIC FATIGUE SYNDROME 11/30/2006  . HEADACHE 11/30/2006   Past Medical History:  Diagnosis Date  . Anxiety   . Arrhythmia    heart races when pt in pain  . Asthma   . Clostridium difficile infection 2014  . [facility] Patient virus infection   . Fibromyalgia   . GERD (gastroesophageal reflux disease)   . Hypertension   . Hypothyroidism   . Immune deficiency disorder ([place])   . Lyme disease   . Mold contact confirmed    Toxic Mold syndrome  . Mononucleosis 5-10  . Ovarian cyst    Serous Cystadenofibroma-Left ovary  . PONV (postoperative nausea and vomiting)   . Shortness of breath    on exertion  . Systolic murmur   . Thyroid disease     Family History  Problem Relation Age of Onset  . Hypertension Mother   . Diabetes Mother   . Pulmonary embolism Mother        x2  . Breast cancer Maternal Aunt        Age 63  . Pancreatic cancer Maternal Grandmother   . Heart disease Maternal Grandfather   . Breast cancer Paternal Grandmother        Age 52  . Asthma Paternal Grandmother     Past Surgical History:  Procedure Laterality Date  . BLADDER SURGERY    . CHOLECYSTECTOMY    . FECAL TRANSPLANT    . Groin cyst    . LAPAROSCOPY Right 06/12/2013   Procedure: LAPAROSCOPY OPERATIVE  fulgeration of endometriosis, right salpingooophorectomy;  Surgeon: Terrance Mass, MD;  Location: [facility];  Service: Gynecology;  Laterality: Right;  . Nose cyst    . OOPHORECTOMY Right 06/12/2013   Procedure: OOPHORECTOMY;POSS RIGHT SALPING OOPHORECTOMY;  Surgeon: Terrance Mass, MD;  Location: [facility];  Service: Gynecology;  Laterality: Right;  . PELVIC LAPAROSCOPY     DL Ovarian cystectomy-Left  . TONSILLECTOMY  2009   Social History   Occupational  History  . Not on file  Tobacco Use  . Smoking status: Never Smoker  . Smokeless tobacco: Never Used  Substance and Sexual Activity  . Alcohol use: No    Alcohol/week: 0.0 standard drinks  . Drug use: No  . Sexual activity: Never    Birth control/protection: Pill

## 2018-10-03 ENCOUNTER — Telehealth (INDEPENDENT_AMBULATORY_CARE_PROVIDER_SITE_OTHER): Payer: Self-pay | Admitting: Radiology

## 2018-10-03 ENCOUNTER — Ambulatory Visit (INDEPENDENT_AMBULATORY_CARE_PROVIDER_SITE_OTHER): Payer: BLUE CROSS/BLUE SHIELD | Admitting: Physician Assistant

## 2018-10-03 ENCOUNTER — Telehealth (INDEPENDENT_AMBULATORY_CARE_PROVIDER_SITE_OTHER): Payer: Self-pay

## 2018-10-03 ENCOUNTER — Other Ambulatory Visit (INDEPENDENT_AMBULATORY_CARE_PROVIDER_SITE_OTHER): Payer: Self-pay

## 2018-10-03 ENCOUNTER — Encounter (INDEPENDENT_AMBULATORY_CARE_PROVIDER_SITE_OTHER): Payer: Self-pay | Admitting: Physician Assistant

## 2018-10-03 DIAGNOSIS — M542 Cervicalgia: Secondary | ICD-10-CM

## 2018-10-03 DIAGNOSIS — M4807 Spinal stenosis, lumbosacral region: Secondary | ICD-10-CM

## 2018-10-03 MED ORDER — DIAZEPAM 5 MG PO TABS: ORAL_TABLET | ORAL | 0 refills | Status: DC

## 2018-10-03 NOTE — Progress Notes (Signed)
Office Visit Note   Patient: [name]           Date of Birth: 05-12-1971           MRN: [id number] Visit Date: 10/03/2018              Requested by: Deland Pretty, MD [address] [place], [place] [address] PCP: Deland Pretty, MD   Assessment & Plan: Visit Diagnoses:  1. Cervicalgia     Plan: Due to patient's continued radicular symptoms down the right arm despite conservative treatment recommend MRI to rule out HNP as source of her right arm radicular pain.  Have her follow-up after the MRI to go over results and discuss further treatment.  Questions encouraged and answered.  Should continue use of TENS unit and heat to the cervical region.  Follow-Up Instructions: Return for After MRI.   Orders:  No orders of the defined types were placed in this encounter.  Meds ordered this encounter  Medications  . diazepam (VALIUM) 5 MG tablet    Sig: TAKE ONE TAB ONE HOUR PRIOR TO MRI REPEAT AS NEEDED #2 . ZERO REFILLS    Dispense:  2 tablet    Refill:  0      Procedures: No procedures performed   Clinical Data: No additional findings.   Subjective: Chief Complaint  Patient presents with  . Neck - Pain  . Right Shoulder - Pain    HPI [name] returns today follow-up of her neck pain with radicular symptoms down the right arm.  She states the steroids did help with the pain down the arm but once she finished with the steroid her neck pain came back and is now radiating into the shoulder down into the hand mostly affecting the right thumb.  She would describe some numbness throbbing sensation in the thumb.  She states in the forearm she has a sensation of the TENS unit being turned up.  She notes that with certain range of motion of the shoulder that her pain is worse. Review of Systems Please see HPI  Objective: Vital Signs: There were no vitals taken for this visit.  Physical Exam General: Well-developed well-nourished female no acute  distress. Psych: Alert and oriented x3.  Ortho Exam Cervical spine she has full flexion without pain.  Limited extension cervical spine with pain.  Rotation of the cervical spine also causes pain.  Tenderness medial border of the right scapula.  Fluid range of motion of the right shoulder without pain.  Radial pulses intact.  Sensation intact throughout the right hand except for the thumb which she has decreased sensation up. Specialty Comments:  No specialty comments available.  Imaging: No results found.   PMFS History: Patient Active Problem List   Diagnosis Date Noted  . Personal history of endometriosis 11/30/2016  . Morbid obesity ([place]) 07/03/2014  . Dyspnea 07/02/2014  . Clostridium difficile infection   . GASTROPARESIS 06/15/2009  . INSOMNIA 05/12/2009  . EPIGASTRIC PAIN 04/15/2009  . INFECTIOUS MONONUCLEOSIS 03/10/2009  . TACHYCARDIA 03/10/2009  . ADENOMATOUS COLONIC POLYP 12/05/2007  . FUNGAL DERMATITIS 11/30/2006  . HYPERLIPIDEMIA 11/30/2006  . PANIC DISORDER 11/30/2006  . ASTHMA 11/30/2006  . GERD 11/30/2006  . IRRITABLE BOWEL SYNDROME 11/30/2006  . INTERSTITIAL CYSTITIS 11/30/2006  . MYOFASCIAL PAIN SYNDROME 11/30/2006  . CHRONIC FATIGUE SYNDROME 11/30/2006  . HEADACHE 11/30/2006   Past Medical History:  Diagnosis Date  . Anxiety   . Arrhythmia    heart races when  pt in pain  . Asthma   . Clostridium difficile infection 2014  . [facility] Patient virus infection   . Fibromyalgia   . GERD (gastroesophageal reflux disease)   . Hypertension   . Hypothyroidism   . Immune deficiency disorder ([place])   . Lyme disease   . Mold contact confirmed    Toxic Mold syndrome  . Mononucleosis 5-10  . Ovarian cyst    Serous Cystadenofibroma-Left ovary  . PONV (postoperative nausea and vomiting)   . Shortness of breath    on exertion  . Systolic murmur   . Thyroid disease     Family History  Problem Relation Age of Onset  . Hypertension Mother   . Diabetes Mother    . Pulmonary embolism Mother        x2  . Breast cancer Maternal Aunt        Age 25  . Pancreatic cancer Maternal Grandmother   . Heart disease Maternal Grandfather   . Breast cancer Paternal Grandmother        Age 68  . Asthma Paternal Grandmother     Past Surgical History:  Procedure Laterality Date  . BLADDER SURGERY    . CHOLECYSTECTOMY    . FECAL TRANSPLANT    . Groin cyst    . LAPAROSCOPY Right 06/12/2013   Procedure: LAPAROSCOPY OPERATIVE  fulgeration of endometriosis, right salpingooophorectomy;  Surgeon: Terrance Mass, MD;  Location: [facility];  Service: Gynecology;  Laterality: Right;  . Nose cyst    . OOPHORECTOMY Right 06/12/2013   Procedure: OOPHORECTOMY;POSS RIGHT SALPING OOPHORECTOMY;  Surgeon: Terrance Mass, MD;  Location: [facility];  Service: Gynecology;  Laterality: Right;  . PELVIC LAPAROSCOPY     DL Ovarian cystectomy-Left  . TONSILLECTOMY  2009   Social History   Occupational History  . Not on file  Tobacco Use  . Smoking status: Never Smoker  . Smokeless tobacco: Never Used  Substance and Sexual Activity  . Alcohol use: No    Alcohol/week: 0.0 standard drinks  . Drug use: No  . Sexual activity: Never    Birth control/protection: Pill

## 2018-10-03 NOTE — Telephone Encounter (Signed)
I just sent an order for MRI on this patient but forgot to write open MRI, can you add that please

## 2018-10-03 NOTE — Telephone Encounter (Signed)
Valium Rx called in. [phone]

## 2018-10-12 ENCOUNTER — Ambulatory Visit
Admission: RE | Admit: 2018-10-12 | Discharge: 2018-10-12 | Disposition: A | Discharge status: Home / Self Care | Payer: BLUE CROSS/BLUE SHIELD | Source: Ambulatory Visit | Attending: Orthopaedic Surgery | Admitting: Orthopaedic Surgery

## 2018-10-12 DIAGNOSIS — M4802 Spinal stenosis, cervical region: Secondary | ICD-10-CM | POA: Diagnosis not present

## 2018-10-12 DIAGNOSIS — M4807 Spinal stenosis, lumbosacral region: Secondary | ICD-10-CM

## 2018-10-17 ENCOUNTER — Encounter (INDEPENDENT_AMBULATORY_CARE_PROVIDER_SITE_OTHER): Payer: Self-pay | Admitting: Physician Assistant

## 2018-10-17 ENCOUNTER — Ambulatory Visit (INDEPENDENT_AMBULATORY_CARE_PROVIDER_SITE_OTHER): Payer: BLUE CROSS/BLUE SHIELD | Admitting: Physician Assistant

## 2018-10-17 DIAGNOSIS — M5412 Radiculopathy, cervical region: Secondary | ICD-10-CM

## 2018-10-17 NOTE — Progress Notes (Signed)
Office Visit Note   Patient: [name]           Date of Birth: Oct 25, 1970           MRN: [id number] Visit Date: 10/17/2018              Requested by: Deland Pretty, MD [address] [place], [place] [address] PCP: Deland Pretty, MD   Assessment & Plan: Visit Diagnoses:  1. Cervical radiculopathy     Plan: Send her to physical therapy for strengthening cervical spine, traction and modalities.  There also did include a home exercise program.  See her back in a month to see what type of response she has had to this.  She would like to see how she does with most conservative treatment.  Other treatment options discussed with her as a possible cervical epidural steroid injection versus referral to neurosurgery.  She also may require an EMG nerve conduction study rule out carpal tunnel or evaluate for carpal tunnel syndrome as she does have positive Tinel's at the right wrist.  Discussed all of this at length with her.  She will follow-up in 1 month  Follow-Up Instructions: Return for 1 month Dr. Ninfa Linden.   Orders:  No orders of the defined types were placed in this encounter.  No orders of the defined types were placed in this encounter.     Procedures: No procedures performed   Clinical Data: No additional findings.   Subjective: Chief Complaint  Patient presents with  . Neck - Pain, Follow-up    HPI [name] returns today follow-up of her neck pain status post MRI of the cervical spine which was performed on 10/12/2018.  He continues to have radicular pain down the right arm into the right thumb.  He still has pain in the neck head with certain range of motion and has pain in the right scapula.  She denies any numbness tingling or pain down the left arm. MRI cervical spine dated 10/12/2018 images are reviewed with the patient cervical models used for further visualization purposes.  MRI showed a paracentral disc protrusion at C5-C6 which contacts the ventral  cord and also causing mild foraminal stenosis on the right and moderate to advanced on the left.  No fractures bony lesions.  Review of Systems Please see HPI  Objective: Vital Signs: There were no vitals taken for this visit.  Physical Exam Constitutional:      Appearance: She is not ill-appearing or diaphoretic.  Pulmonary:     Effort: Pulmonary effort is normal.  Neurological:     Mental Status: She is alert.     Ortho Exam Cervical spine she has discomfort with rotation to the right.  Subjective decreased sensation right thumb.  Positive Tinel's at the wrist on the right negative on the left.  Negative Phalen's negative compression test over the median nerve bilaterally.  Specialty Comments:  No specialty comments available.  Imaging: No results found.   PMFS History: Patient Active Problem List   Diagnosis Date Noted  . Personal history of endometriosis 11/30/2016  . Morbid obesity ([place]) 07/03/2014  . Dyspnea 07/02/2014  . Clostridium difficile infection   . GASTROPARESIS 06/15/2009  . INSOMNIA 05/12/2009  . EPIGASTRIC PAIN 04/15/2009  . INFECTIOUS MONONUCLEOSIS 03/10/2009  . TACHYCARDIA 03/10/2009  . ADENOMATOUS COLONIC POLYP 12/05/2007  . FUNGAL DERMATITIS 11/30/2006  . HYPERLIPIDEMIA 11/30/2006  . PANIC DISORDER 11/30/2006  . ASTHMA 11/30/2006  . GERD 11/30/2006  . IRRITABLE BOWEL SYNDROME  11/30/2006  . INTERSTITIAL CYSTITIS 11/30/2006  . MYOFASCIAL PAIN SYNDROME 11/30/2006  . CHRONIC FATIGUE SYNDROME 11/30/2006  . HEADACHE 11/30/2006   Past Medical History:  Diagnosis Date  . Anxiety   . Arrhythmia    heart races when pt in pain  . Asthma   . Clostridium difficile infection 2014  . [facility] Patient virus infection   . Fibromyalgia   . GERD (gastroesophageal reflux disease)   . Hypertension   . Hypothyroidism   . Immune deficiency disorder ([place])   . Lyme disease   . Mold contact confirmed    Toxic Mold syndrome  . Mononucleosis 5-10  . Ovarian  cyst    Serous Cystadenofibroma-Left ovary  . PONV (postoperative nausea and vomiting)   . Shortness of breath    on exertion  . Systolic murmur   . Thyroid disease     Family History  Problem Relation Age of Onset  . Hypertension Mother   . Diabetes Mother   . Pulmonary embolism Mother        x2  . Breast cancer Maternal Aunt        Age 31  . Pancreatic cancer Maternal Grandmother   . Heart disease Maternal Grandfather   . Breast cancer Paternal Grandmother        Age 17  . Asthma Paternal Grandmother     Past Surgical History:  Procedure Laterality Date  . BLADDER SURGERY    . CHOLECYSTECTOMY    . FECAL TRANSPLANT    . Groin cyst    . LAPAROSCOPY Right 06/12/2013   Procedure: LAPAROSCOPY OPERATIVE  fulgeration of endometriosis, right salpingooophorectomy;  Surgeon: Terrance Mass, MD;  Location: [facility];  Service: Gynecology;  Laterality: Right;  . Nose cyst    . OOPHORECTOMY Right 06/12/2013   Procedure: OOPHORECTOMY;POSS RIGHT SALPING OOPHORECTOMY;  Surgeon: Terrance Mass, MD;  Location: [facility];  Service: Gynecology;  Laterality: Right;  . PELVIC LAPAROSCOPY     DL Ovarian cystectomy-Left  . TONSILLECTOMY  2009   Social History   Occupational History  . Not on file  Tobacco Use  . Smoking status: Never Smoker  . Smokeless tobacco: Never Used  Substance and Sexual Activity  . Alcohol use: No    Alcohol/week: 0.0 standard drinks  . Drug use: No  . Sexual activity: Never    Birth control/protection: Pill

## 2018-10-30 DIAGNOSIS — R3982 Chronic bladder pain: Secondary | ICD-10-CM | POA: Diagnosis not present

## 2018-10-30 DIAGNOSIS — M62838 Other muscle spasm: Secondary | ICD-10-CM | POA: Diagnosis not present

## 2018-10-30 DIAGNOSIS — M6281 Muscle weakness (generalized): Secondary | ICD-10-CM | POA: Diagnosis not present

## 2018-10-30 DIAGNOSIS — R102 Pelvic and perineal pain: Secondary | ICD-10-CM | POA: Diagnosis not present

## 2018-10-31 DIAGNOSIS — M79601 Pain in right arm: Secondary | ICD-10-CM | POA: Diagnosis not present

## 2018-10-31 DIAGNOSIS — M542 Cervicalgia: Secondary | ICD-10-CM | POA: Diagnosis not present

## 2018-10-31 DIAGNOSIS — M5412 Radiculopathy, cervical region: Secondary | ICD-10-CM | POA: Diagnosis not present

## 2018-11-04 DIAGNOSIS — G588 Other specified mononeuropathies: Secondary | ICD-10-CM | POA: Diagnosis not present

## 2018-11-04 DIAGNOSIS — G894 Chronic pain syndrome: Secondary | ICD-10-CM | POA: Diagnosis not present

## 2018-11-04 DIAGNOSIS — R1084 Generalized abdominal pain: Secondary | ICD-10-CM | POA: Diagnosis not present

## 2018-11-04 DIAGNOSIS — M542 Cervicalgia: Secondary | ICD-10-CM | POA: Diagnosis not present

## 2018-11-12 DIAGNOSIS — M79601 Pain in right arm: Secondary | ICD-10-CM | POA: Diagnosis not present

## 2018-11-12 DIAGNOSIS — Z8619 Personal history of other infectious and parasitic diseases: Secondary | ICD-10-CM | POA: Diagnosis not present

## 2018-11-12 DIAGNOSIS — E039 Hypothyroidism, unspecified: Secondary | ICD-10-CM | POA: Diagnosis not present

## 2018-11-12 DIAGNOSIS — M542 Cervicalgia: Secondary | ICD-10-CM | POA: Diagnosis not present

## 2018-11-12 DIAGNOSIS — J309 Allergic rhinitis, unspecified: Secondary | ICD-10-CM | POA: Diagnosis not present

## 2018-11-12 DIAGNOSIS — I10 Essential (primary) hypertension: Secondary | ICD-10-CM | POA: Diagnosis not present

## 2018-11-12 DIAGNOSIS — M5412 Radiculopathy, cervical region: Secondary | ICD-10-CM | POA: Diagnosis not present

## 2018-11-14 DIAGNOSIS — M5412 Radiculopathy, cervical region: Secondary | ICD-10-CM | POA: Diagnosis not present

## 2018-11-14 DIAGNOSIS — M79601 Pain in right arm: Secondary | ICD-10-CM | POA: Diagnosis not present

## 2018-11-14 DIAGNOSIS — M542 Cervicalgia: Secondary | ICD-10-CM | POA: Diagnosis not present

## 2018-12-02 ENCOUNTER — Ambulatory Visit (INDEPENDENT_AMBULATORY_CARE_PROVIDER_SITE_OTHER): Payer: BLUE CROSS/BLUE SHIELD | Admitting: Orthopaedic Surgery

## 2018-12-31 DIAGNOSIS — G894 Chronic pain syndrome: Secondary | ICD-10-CM | POA: Diagnosis not present

## 2019-01-01 DIAGNOSIS — L821 Other seborrheic keratosis: Secondary | ICD-10-CM | POA: Diagnosis not present

## 2019-01-01 DIAGNOSIS — L814 Other melanin hyperpigmentation: Secondary | ICD-10-CM | POA: Diagnosis not present

## 2019-01-01 DIAGNOSIS — D229 Melanocytic nevi, unspecified: Secondary | ICD-10-CM | POA: Diagnosis not present

## 2019-01-01 DIAGNOSIS — D1801 Hemangioma of skin and subcutaneous tissue: Secondary | ICD-10-CM | POA: Diagnosis not present

## 2019-01-28 DIAGNOSIS — G894 Chronic pain syndrome: Secondary | ICD-10-CM | POA: Diagnosis not present

## 2019-02-25 DIAGNOSIS — R7303 Prediabetes: Secondary | ICD-10-CM | POA: Diagnosis not present

## 2019-02-25 DIAGNOSIS — Z0000 Encounter for general adult medical examination without abnormal findings: Secondary | ICD-10-CM | POA: Diagnosis not present

## 2019-02-25 DIAGNOSIS — I10 Essential (primary) hypertension: Secondary | ICD-10-CM | POA: Diagnosis not present

## 2019-02-25 DIAGNOSIS — E559 Vitamin D deficiency, unspecified: Secondary | ICD-10-CM | POA: Diagnosis not present

## 2019-02-25 DIAGNOSIS — E039 Hypothyroidism, unspecified: Secondary | ICD-10-CM | POA: Diagnosis not present

## 2019-03-17 DIAGNOSIS — G894 Chronic pain syndrome: Secondary | ICD-10-CM | POA: Diagnosis not present

## 2019-04-23 DIAGNOSIS — M5030 Other cervical disc degeneration, unspecified cervical region: Secondary | ICD-10-CM | POA: Diagnosis not present

## 2019-04-23 DIAGNOSIS — R1084 Generalized abdominal pain: Secondary | ICD-10-CM | POA: Diagnosis not present

## 2019-04-23 DIAGNOSIS — G894 Chronic pain syndrome: Secondary | ICD-10-CM | POA: Diagnosis not present

## 2019-04-23 DIAGNOSIS — M542 Cervicalgia: Secondary | ICD-10-CM | POA: Diagnosis not present

## 2019-05-03 DIAGNOSIS — Z20828 Contact with and (suspected) exposure to other viral communicable diseases: Secondary | ICD-10-CM | POA: Diagnosis not present

## 2019-05-05 DIAGNOSIS — R197 Diarrhea, unspecified: Secondary | ICD-10-CM | POA: Diagnosis not present

## 2019-05-05 DIAGNOSIS — Y92239 Unspecified place in hospital as the place of occurrence of the external cause: Secondary | ICD-10-CM | POA: Diagnosis not present

## 2019-05-05 DIAGNOSIS — R110 Nausea: Secondary | ICD-10-CM | POA: Diagnosis not present

## 2019-05-05 DIAGNOSIS — K6389 Other specified diseases of intestine: Secondary | ICD-10-CM | POA: Diagnosis not present

## 2019-05-26 DIAGNOSIS — R197 Diarrhea, unspecified: Secondary | ICD-10-CM | POA: Diagnosis not present

## 2019-05-27 DIAGNOSIS — Z5181 Encounter for therapeutic drug level monitoring: Secondary | ICD-10-CM | POA: Diagnosis not present

## 2019-05-27 DIAGNOSIS — G894 Chronic pain syndrome: Secondary | ICD-10-CM | POA: Diagnosis not present

## 2019-05-27 DIAGNOSIS — M542 Cervicalgia: Secondary | ICD-10-CM | POA: Diagnosis not present

## 2019-05-27 DIAGNOSIS — Z79899 Other long term (current) drug therapy: Secondary | ICD-10-CM | POA: Diagnosis not present

## 2019-05-27 DIAGNOSIS — R1084 Generalized abdominal pain: Secondary | ICD-10-CM | POA: Diagnosis not present

## 2019-06-16 ENCOUNTER — Ambulatory Visit: Payer: BC Managed Care – PPO | Admitting: Obstetrics & Gynecology

## 2019-06-16 ENCOUNTER — Other Ambulatory Visit: Payer: Self-pay

## 2019-06-16 ENCOUNTER — Encounter: Payer: Self-pay | Admitting: Obstetrics & Gynecology

## 2019-06-16 VITALS — BP 138/88 | Ht 61.0 in | Wt 202.0 lb

## 2019-06-16 DIAGNOSIS — R110 Nausea: Secondary | ICD-10-CM | POA: Diagnosis not present

## 2019-06-16 DIAGNOSIS — Z3041 Encounter for surveillance of contraceptive pills: Secondary | ICD-10-CM

## 2019-06-16 DIAGNOSIS — E6609 Other obesity due to excess calories: Secondary | ICD-10-CM

## 2019-06-16 DIAGNOSIS — Z23 Encounter for immunization: Secondary | ICD-10-CM

## 2019-06-16 DIAGNOSIS — Z8742 Personal history of other diseases of the female genital tract: Secondary | ICD-10-CM | POA: Diagnosis not present

## 2019-06-16 DIAGNOSIS — Z6838 Body mass index (BMI) 38.0-38.9, adult: Secondary | ICD-10-CM

## 2019-06-16 DIAGNOSIS — N3010 Interstitial cystitis (chronic) without hematuria: Secondary | ICD-10-CM

## 2019-06-16 DIAGNOSIS — R197 Diarrhea, unspecified: Secondary | ICD-10-CM | POA: Diagnosis not present

## 2019-06-16 DIAGNOSIS — Z01419 Encounter for gynecological examination (general) (routine) without abnormal findings: Secondary | ICD-10-CM

## 2019-06-16 MED ORDER — NORETHIN ACE-ETH ESTRAD-FE 1-20 MG-MCG PO TABS: 1.0000 | ORAL_TABLET | Freq: Every day | ORAL | 4 refills | Status: DC

## 2019-06-16 NOTE — Progress Notes (Signed)
[name] 1971-06-07 [id number]   History:    48 y.o. G0 Single/[place]  RP:  Established patient presenting for annual gyn exam   HPI: Well on continuous Junel Fe 1/20 for menometrorrhagia/dysmenorrhea/H/O Endometriosis.  No BTB.  No pelvic pain.  [place].  PT for IC.  BMs wnl. Breasts normal.  BMI 38.17.  Not exercising regularly.  Fam MD for Health labs.  Past medical history,surgical history, family history and social history were all reviewed and documented in the EPIC chart.  Gynecologic History No LMP recorded. (Menstrual status: Oral contraceptives). Contraception: OCP (estrogen/progesterone)/Abstinence Last Pap: 11/2016. Results were: Negative Last mammogram: 06/2018. Results were: Negative Bone Density: Never Colonoscopy: Never  Obstetric History OB History  Gravida Para Term Preterm AB Living  0            SAB TAB Ectopic Multiple Live Births                ROS: A ROS was performed and pertinent positives and negatives are included in the history.  GENERAL: No fevers or chills. HEENT: No change in vision, no earache, sore throat or sinus congestion. NECK: No pain or stiffness. CARDIOVASCULAR: No chest pain or pressure. No palpitations. PULMONARY: No shortness of breath, cough or wheeze. GASTROINTESTINAL: No abdominal pain, nausea, vomiting or diarrhea, melena or bright red blood per rectum. GENITOURINARY: No urinary frequency, urgency, hesitancy or dysuria. MUSCULOSKELETAL: No joint or muscle pain, no back pain, no recent trauma. DERMATOLOGIC: No rash, no itching, no lesions. ENDOCRINE: No polyuria, polydipsia, no heat or cold intolerance. No recent change in weight. HEMATOLOGICAL: No anemia or easy bruising or bleeding. NEUROLOGIC: No headache, seizures, numbness, tingling or weakness. PSYCHIATRIC: No depression, no loss of interest in normal activity or change in sleep pattern.     Exam:   BP 138/88   Ht 5\' 1"  (1.549 m)   Wt 202 lb (91.6 kg)   BMI 38.17 kg/m    Body mass index is 38.17 kg/m.  General appearance : Well developed well nourished female. No acute distress HEENT: Eyes: no retinal hemorrhage or exudates,  Neck supple, trachea midline, no carotid bruits, no thyroidmegaly Lungs: Clear to auscultation, no rhonchi or wheezes, or rib retractions  Heart: Regular rate and rhythm, no murmurs or gallops Breast:Examined in sitting and supine position were symmetrical in appearance, no palpable masses or tenderness,  no skin retraction, no nipple inversion, no nipple discharge, no skin discoloration, no axillary or supraclavicular lymphadenopathy Abdomen: no palpable masses or tenderness, no rebound or guarding Extremities: no edema or skin discoloration or tenderness  Pelvic: Vulva: Normal             Vagina: No gross lesions or discharge  Cervix: No gross lesions or discharge  Uterus  AV, normal size, shape and consistency, non-tender and mobile  Adnexa  Without masses or tenderness  Anus: Normal   Assessment/Plan:  48 y.o. female for annual exam   1. Well female exam with routine gynecological exam Normal gynecologic exam.  [place].  Pap test March 2018 was negative, no indication to repeat this year.  Breast exam normal.  Screening mammogram October 2019 was negative, patient will schedule a repeat screening mammogram now.  Health labs with family physician.  2. Encounter for surveillance of contraceptive pills Continuous birth control pills controlling menometrorrhagia and dysmenorrhea, history of endometriosis.  No contraindications continue on continuous Junel FE 1/20.  Prescription sent to pharmacy.  3. History of endometriosis Symptoms controlled on continuous  birth control pills.  4. INTERSTITIAL CYSTITIS Followed by urology.  Will start back physical therapy.  5. Class 2 obesity due to excess calories without serious comorbidity with body mass index (BMI) of 38.0 to 38.9 in adult Recommend a lower calorie/carb diet such as  [employer].  Aerobic physical activities 5 times a week and weightlifting every 2 days.  6. Flu vaccine need Flu shot given today.  Other orders - Flu Vaccine QUAD 36+ mos IM (Fluarix, Quad PF) - norethindrone-ethinyl estradiol (JUNEL FE 1/20) 1-20 MG-MCG tablet; Take 1 tablet by mouth daily. Continuous use for Dysmenorrhea  Princess Bruins MD, 3:45 PM 06/16/2019

## 2019-06-16 NOTE — Patient Instructions (Signed)
1. Well female exam with routine gynecological exam Normal gynecologic exam.  [place].  Pap test March 2018 was negative, no indication to repeat this year.  Breast exam normal.  Screening mammogram October 2019 was negative, patient will schedule a repeat screening mammogram now.  Health labs with family physician.  2. Encounter for surveillance of contraceptive pills Continuous birth control pills controlling menometrorrhagia and dysmenorrhea, history of endometriosis.  No contraindications continue on continuous Junel FE 1/20.  Prescription sent to pharmacy.  3. History of endometriosis Symptoms controlled on continuous birth control pills.  4. INTERSTITIAL CYSTITIS Followed by urology.  Will start back physical therapy.  5. Class 2 obesity due to excess calories without serious comorbidity with body mass index (BMI) of 38.0 to 38.9 in adult Recommend a lower calorie/carb diet such as [employer].  Aerobic physical activities 5 times a week and weightlifting every 2 days.  6. Flu vaccine need Flu shot given today.  Other orders - Flu Vaccine QUAD 36+ mos IM (Fluarix, Quad PF) - norethindrone-ethinyl estradiol (JUNEL FE 1/20) 1-20 MG-MCG tablet; Take 1 tablet by mouth daily. Continuous use for Dysmenorrhea  [place], it was a pleasure seeing you today!

## 2019-07-14 DIAGNOSIS — K6389 Other specified diseases of intestine: Secondary | ICD-10-CM | POA: Diagnosis not present

## 2019-07-14 DIAGNOSIS — Z20828 Contact with and (suspected) exposure to other viral communicable diseases: Secondary | ICD-10-CM | POA: Diagnosis not present

## 2019-07-14 DIAGNOSIS — Z01812 Encounter for preprocedural laboratory examination: Secondary | ICD-10-CM | POA: Diagnosis not present

## 2019-07-18 DIAGNOSIS — R140 Abdominal distension (gaseous): Secondary | ICD-10-CM | POA: Diagnosis not present

## 2019-07-18 DIAGNOSIS — R109 Unspecified abdominal pain: Secondary | ICD-10-CM | POA: Diagnosis not present

## 2019-07-18 DIAGNOSIS — K6389 Other specified diseases of intestine: Secondary | ICD-10-CM | POA: Diagnosis not present

## 2019-07-18 DIAGNOSIS — K909 Intestinal malabsorption, unspecified: Secondary | ICD-10-CM | POA: Diagnosis not present

## 2019-07-18 DIAGNOSIS — R197 Diarrhea, unspecified: Secondary | ICD-10-CM | POA: Diagnosis not present

## 2019-07-22 DIAGNOSIS — R1084 Generalized abdominal pain: Secondary | ICD-10-CM | POA: Diagnosis not present

## 2019-07-22 DIAGNOSIS — M5030 Other cervical disc degeneration, unspecified cervical region: Secondary | ICD-10-CM | POA: Diagnosis not present

## 2019-07-22 DIAGNOSIS — G894 Chronic pain syndrome: Secondary | ICD-10-CM | POA: Diagnosis not present

## 2019-07-22 DIAGNOSIS — M542 Cervicalgia: Secondary | ICD-10-CM | POA: Diagnosis not present

## 2019-09-16 DIAGNOSIS — Z5181 Encounter for therapeutic drug level monitoring: Secondary | ICD-10-CM | POA: Diagnosis not present

## 2019-09-16 DIAGNOSIS — M542 Cervicalgia: Secondary | ICD-10-CM | POA: Diagnosis not present

## 2019-09-16 DIAGNOSIS — R1084 Generalized abdominal pain: Secondary | ICD-10-CM | POA: Diagnosis not present

## 2019-09-16 DIAGNOSIS — Z79899 Other long term (current) drug therapy: Secondary | ICD-10-CM | POA: Diagnosis not present

## 2019-09-16 DIAGNOSIS — M5030 Other cervical disc degeneration, unspecified cervical region: Secondary | ICD-10-CM | POA: Diagnosis not present

## 2019-09-16 DIAGNOSIS — G894 Chronic pain syndrome: Secondary | ICD-10-CM | POA: Diagnosis not present

## 2019-09-29 DIAGNOSIS — F419 Anxiety disorder, unspecified: Secondary | ICD-10-CM | POA: Diagnosis not present

## 2019-10-14 DIAGNOSIS — M542 Cervicalgia: Secondary | ICD-10-CM | POA: Diagnosis not present

## 2019-10-14 DIAGNOSIS — G894 Chronic pain syndrome: Secondary | ICD-10-CM | POA: Diagnosis not present

## 2019-10-14 DIAGNOSIS — R1084 Generalized abdominal pain: Secondary | ICD-10-CM | POA: Diagnosis not present

## 2019-10-14 DIAGNOSIS — M5030 Other cervical disc degeneration, unspecified cervical region: Secondary | ICD-10-CM | POA: Diagnosis not present

## 2019-10-29 DIAGNOSIS — F419 Anxiety disorder, unspecified: Secondary | ICD-10-CM | POA: Diagnosis not present

## 2019-10-29 DIAGNOSIS — G2581 Restless legs syndrome: Secondary | ICD-10-CM | POA: Diagnosis not present

## 2019-11-11 ENCOUNTER — Other Ambulatory Visit: Payer: Self-pay

## 2019-11-11 ENCOUNTER — Ambulatory Visit: Payer: BC Managed Care – PPO | Admitting: Internal Medicine

## 2019-11-11 ENCOUNTER — Ambulatory Visit (INDEPENDENT_AMBULATORY_CARE_PROVIDER_SITE_OTHER): Payer: BC Managed Care – PPO

## 2019-11-11 ENCOUNTER — Encounter: Payer: Self-pay | Admitting: Internal Medicine

## 2019-11-11 DIAGNOSIS — R05 Cough: Secondary | ICD-10-CM | POA: Diagnosis not present

## 2019-11-11 DIAGNOSIS — R0600 Dyspnea, unspecified: Secondary | ICD-10-CM | POA: Diagnosis not present

## 2019-11-11 DIAGNOSIS — R0602 Shortness of breath: Secondary | ICD-10-CM | POA: Diagnosis not present

## 2019-11-11 DIAGNOSIS — R0609 Other forms of dyspnea: Secondary | ICD-10-CM

## 2019-11-11 DIAGNOSIS — R058 Other specified cough: Secondary | ICD-10-CM

## 2019-11-11 NOTE — Progress Notes (Signed)
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 first noted symptoms of sob around 2004 while working at [employer] x 2001 - 2010 stopped working p admitted with dx of pna at [facility] x one week ( roof at school room fell in and swept it up and a week prior to admit and was told black mold caused her infection though not seen by pulmonary medicine, just Dr Rockne Menghini per pt) and only   25% better at discharge never back to aerobic levels  at wt 170 at d/c   eval by Donneta Romberg initially with poor pfts with allergy to dust/ dogs/ per pt rec saba before ex and symbicort as maint but 0% better rec allergy shots did not agree to do them but continued the inhalers s benefit then abruptly worse in 2010 p tonsilectomy > dx gastroparesis/ viral hepatitis > [facility] Henrene Past[facility] > dx was bacterial overgrowth c/w  c diff  > fecal transplant at [facility] [place] sees with ongoing nausea and vomiting and told never to take ppi again  referred to pulmonary clinic 07/02/2014 by Workers comp    History of Present Illness  07/02/2014 1st [facility] office visit/ Writer Complaint  Patient presents with  . Pulmonary Consult    Worker's Comp Case. Pt c/o SOB since 2005. She states that she gets SOB with walking up stairs, walking fast or "any kind of exercise". 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   last known mold exposure 2010 Breathing a little better in cooler weather anything more than walking flat room to room causes sob, can't do one flight of steps  rx advair but did not use am of ov Uses saba a little better if uses right before ex Some cough with activity but not with meals and not noct- actually sleeps fine Last vomited sev months prior to OV   rec  Weight loss/ reconditioning   11/11/2019  consultation/[name] re:  Sob/ cough on bcp's with h/o gastroparesis / did not bring inhalers/ meds  Chief Complaint  Patient presents with  . Consult    Was exposed to a lot of mold at work.   Dyspnea: walks neighborhood slow pace x 10 min o/w chest tight/ cough/ sob  Cough: dry/ nose runs / sore throat/ esp fall /spring  Sleeping: no resp symptoms 30-45 degrees wedge  SABA use: can def tell a difference of use inhaler when  walks but only a smidge better if ex p saba  02: none    No obvious day to day or daytime variability or assoc excess/ purulent sputum or mucus plugs or hemoptysis or cp or chest tightness, subjective wheeze or overt sinus or hb symptoms.   Sleeping as above  without nocturnal  or early am exacerbation  of respiratory  c/o's or need for noct saba. Also denies any obvious fluctuation of symptoms with weather or environmental changes or other aggravating or alleviating factors except as outlined above   No unusual exposure hx or h/o childhood pna/ asthma or knowledge of premature birth.  Current Allergies, Complete Past Medical History, Past Surgical History, Family History, and Social History were reviewed in [employer] record.  ROS  The following  are not active complaints unless bolded Hoarseness, sore throat, dysphagia, dental problems, itching, sneezing,  nasal congestion or discharge of excess mucus or purulent secretions, ear ache,   fever, chills, sweats, unintended wt loss or wt gain, classically pleuritic or exertional cp,  orthopnea pnd or arm/hand swelling  or leg swelling, presyncope, palpitations, abdominal pain, anorexia, nausea, vomiting, diarrhea  or change in bowel habits or change in bladder habits, change in stools or change in urine, dysuria, hematuria,  rash, arthralgias, visual complaints, headache, numbness, weakness or ataxia or problems with walking or coordination,  change in mood= anxious  or  memory.        Current Meds - -  NOTE:   Unable to verify as accurately reflecting what pt takes     Medication Sig  . albuterol (PROAIR HFA) 108 (90 BASE) MCG/ACT inhaler Inhale 2 puffs into the lungs every 6 (six) hours as needed for wheezing or shortness of breath.  . ALPRAZolam (XANAX) 1 MG tablet Take 0.5 mg by mouth at bedtime as needed.   Marland Kitchen amLODipine (NORVASC) 2.5 MG tablet Take 1 tablet (2.5 mg total) by mouth daily. NEED OV.  . bisoprolol (ZEBETA) 10 MG tablet Take 10 mg by mouth daily.  . Buprenorphine HCl (BELBUCA) 300 MCG FILM Place inside cheek.  .     . cetirizine (ZYRTEC) 10 MG tablet Take by mouth.  . Cholecalciferol (VITAMIN D PO) Take 1 tablet by mouth daily.   . citalopram (CELEXA) 40 MG tablet Take 40 mg by mouth daily.  . colesevelam (WELCHOL) 625 MG tablet Take 625 mg by mouth 2 (two) times daily with a meal.  . diazepam (VALIUM) 10 MG tablet Take 10 mg by mouth every 6 (six) hours as needed for anxiety. VAGINAL SUPPOSITORY  . diazepam (VALIUM) 5 MG tablet TAKE ONE TAB ONE HOUR PRIOR TO MRI REPEAT AS NEEDED #2 . ZERO REFILLS  . ELMIRON 100 MG capsule Take 100 mg by mouth daily.  . fluticasone (FLONASE) 50 MCG/ACT nasal spray Place 1 spray into both nostrils daily.  Marland Kitchen HYOSCYAMINE PO Take 0.125 mg by mouth daily.   . Ibuprofen-Famotidine (DUEXIS) 800-26.6 MG TABS Take by mouth.  . Melatonin 10 MG TABS Take by mouth.  . methocarbamol (ROBAXIN) 500 MG tablet Take 1 tablet (500 mg total) by mouth 3 (three) times daily.  .     . norethindrone-ethinyl estradiol (JUNEL FE 1/20) 1-20 MG-MCG tablet Take 1 tablet by mouth daily. Continuous use for Dysmenorrhea  . oxyCODONE ER (XTAMPZA ER) 27 MG C12A Take by mouth.  . Probiotic Product (ALIGN PO) Take by mouth as needed.  . prochlorperazine (COMPAZINE) 10 MG tablet Take 10 mg by mouth every 6 (six) hours as needed.  . promethazine (PHENERGAN) 25 MG tablet Take 25 mg by mouth every 6 (six) hours as needed.  Marland Kitchen rOPINIRole (REQUIP) 1 MG tablet Take 1 mg by mouth 3  (three) times daily.  Marland Kitchen scopolamine (TRANSDERM-SCOP) 1 MG/3DAYS Place 1 patch onto the skin every 3 (three) days.  Marland Kitchen telmisartan (MICARDIS) 80 MG tablet Take 80 mg by mouth daily.  Marland Kitchen thyroid (ARMOUR) 30 MG tablet Take 30 mg by mouth daily before breakfast.  . tiaGABine (GABITRIL) 4 MG tablet Take 4 mg by mouth at bedtime.  . vitamin B-12 (CYANOCOBALAMIN) 500 MCG tablet Take 500 mcg by mouth daily.                        Objective:  Physical Exam  amb obese wf nad   11/11/2019          204   07/02/14 207 lb (93.895 kg)  03/09/14 202 lb (91.627 kg)  09/09/13 199 lb 3.2 oz (90.357 kg)     Vital signs reviewed  11/11/2019  - Note at rest 02 sats  97% on RA     HEENT : pt wearing mask not removed for exam due to covid -19 concerns.    NECK :  without JVD/Nodes/TM/ nl carotid upstrokes bilaterally   LUNGS: no acc muscle use,  Nl contour chest which is clear to A and P bilaterally without cough on insp or exp maneuvers   CV:  RRR  no s3 or murmur or increase in P2, and no edema   ABD:  Obese soft and nontender with nl inspiratory excursion in the supine position. No bruits or organomegaly appreciated, bowel sounds nl  MS:  Nl gait/ ext warm without deformities, calf tenderness, cyanosis or clubbing No obvious joint restrictions   SKIN: warm and dry without lesions    NEURO:  alert, approp, nl sensorium with  no motor or cerebellar deficits apparent.      CXR PA and Lateral:   11/11/2019 :    I personally reviewed images and agree with radiology impression as follows:    No active cardiopulmonary disease.  Labs ordered/ reviewed:      Chemistry      Component Value Date/Time   NA 137 11/11/2019 1707   NA 142 09/09/2013 1102   K 4.3 11/11/2019 1707   K 4.3 09/09/2013 1102   CL 100 11/11/2019 1707   CO2 32 11/11/2019 1707   CO2 27 09/09/2013 1102   BUN 13 11/11/2019 1707   BUN 11.0 09/09/2013 1102   CREATININE 0.94 11/11/2019 1707   CREATININE 0.9 09/09/2013 1102       Component Value Date/Time   CALCIUM 9.1 11/11/2019 1707                                  Lab Results  Component Value Date   WBC 10.3 11/11/2019   HGB 12.6 11/11/2019   HCT 37.7 11/11/2019   MCV 88.3 11/11/2019   PLT 300.0 11/11/2019       EOS                                                               0.3                                     11/11/2019   Lab Results  Component Value Date   DDIMER 0.59 (H) 07/02/2014      Lab Results  Component Value Date   TSH 1.27 11/11/2019     Lab Results  Component Value Date   PROBNP 35.0 11/11/2019        Labs ordered 11/11/2019  :  allergy profile                Assessment & Plan:

## 2019-11-11 NOTE — Patient Instructions (Addendum)
Try albuterol 15 min on alternate days  before an activity that you know would make you short of breath and see if it makes any difference        To get the most out of exercise, you need to be continuously aware that you are short of breath, but never out of breath, for 30 minutes daily. As you improve, it will actually be easier for you to do the same amount of exercise  in  30 minutes so always push to the level where you are short of breath.    We will call to schedule you a cpst in no sooner than 2 weeks to let you try the above 1st

## 2019-11-12 ENCOUNTER — Encounter: Payer: Self-pay | Admitting: Internal Medicine

## 2019-11-12 ENCOUNTER — Telehealth: Payer: Self-pay | Admitting: Internal Medicine

## 2019-11-12 DIAGNOSIS — R058 Other specified cough: Secondary | ICD-10-CM | POA: Insufficient documentation

## 2019-11-12 DIAGNOSIS — R05 Cough: Secondary | ICD-10-CM | POA: Insufficient documentation

## 2019-11-12 LAB — BASIC METABOLIC PANEL
BUN: 13 mg/dL (ref 6–23)
CO2: 32 mEq/L (ref 19–32)
Calcium: 9.1 mg/dL (ref 8.4–10.5)
Chloride: 100 mEq/L (ref 96–112)
Creatinine, Ser: 0.94 mg/dL (ref 0.40–1.20)
GFR: 63.79 mL/min (ref >60.00)
Glucose, Bld: 84 mg/dL (ref 70–99)
Potassium: 4.3 mEq/L (ref 3.5–5.1)
Sodium: 137 mEq/L (ref 135–145)

## 2019-11-12 LAB — IGE: IgE (Immunoglobulin E), Serum: 8 kU/L (ref <=114)

## 2019-11-12 LAB — CBC WITH DIFFERENTIAL/PLATELET
Basophils Absolute: 0.1 10*3/uL (ref 0.0–0.1)
Basophils Relative: 0.7 % (ref 0.0–3.0)
Eosinophils Absolute: 0.3 10*3/uL (ref 0.0–0.7)
Eosinophils Relative: 3.1 % (ref 0.0–5.0)
HCT: 37.7 % (ref 36.0–46.0)
Hemoglobin: 12.6 g/dL (ref 12.0–15.0)
Lymphocytes Relative: 31.8 % (ref 12.0–46.0)
Lymphs Abs: 3.3 10*3/uL (ref 0.7–4.0)
MCHC: 33.5 g/dL (ref 30.0–36.0)
MCV: 88.3 fl (ref 78.0–100.0)
Monocytes Absolute: 0.5 10*3/uL (ref 0.1–1.0)
Monocytes Relative: 4.6 % (ref 3.0–12.0)
Neutro Abs: 6.2 10*3/uL (ref 1.4–7.7)
Neutrophils Relative %: 59.8 % (ref 43.0–77.0)
Platelets: 300 10*3/uL (ref 150.0–400.0)
RBC: 4.28 Mil/uL (ref 3.87–5.11)
RDW: 12.6 % (ref 11.5–15.5)
WBC: 10.3 10*3/uL (ref 4.0–10.5)

## 2019-11-12 LAB — BRAIN NATRIURETIC PEPTIDE: Pro B Natriuretic peptide (BNP): 35 pg/mL (ref 0.0–100.0)

## 2019-11-12 LAB — TSH: TSH: 1.27 u[IU]/mL (ref 0.35–4.50)

## 2019-11-12 NOTE — Assessment & Plan Note (Addendum)
Onset around 2004/5 much worse p ? Black mold exp 2010 and then admit with pna a week later - 07/02/2014  Walked RA x 3 laps @ 185 ft each stopped due to end of study  - spirometry 07/02/2014 > wnl   X for min mid flow reduction - 11/11/2019   Walked RA x trhee  laps =  approx 750 ft   at a   moderate pace. She did have some SOB but not coughing during her walk. She felt hot due to wearing the mask but wanted to continue to walk with sats 99% at end and pulse only 95 @ peak - Allergy profile 11/11/2019 >  Eos 0.3 /  IgE  8  I can't comment on any aspect of her care other than what I have gathered from the records available which do no include any info on her admit to [facility] as it was prior to Epic but would be happy to review these if they are made available to me.  However, it is clear that her chronic refractory symptoms are markedly disproportionate to objective findings and not clear to what extent this is actually a pulmonary  problem but pt does appear to have difficult to sort out respiratory symptoms of unknown origin for which  DDX  = almost all start with A and  include Adherence, Ace Inhibitors, Acid Reflux, Active Sinus Disease, Alpha 1 Antitripsin deficiency, Anxiety masquerading as Airways dz,  ABPA,  Allergy(esp in young), Aspiration (esp in elderly), Adverse effects of meds,  Active smoking or Vaping, A bunch of PE's/clot burden (a few small clots can't cause this syndrome unless there is already severe underlying pulm or vascular dz with poor reserve),  Anemia or thyroid disorder, plus two Bs  = Bronchiectasis and Beta blocker use..and one C= CHF    Adherence is always the initial "prime suspect" and is a multilayered concern that requires a "trust but verify" approach in every patient - starting with knowing how to use medications, especially inhalers, correctly, keeping up with refills and understanding the fundamental difference between maintenance and prns vs those medications only taken for a  very short course and then stopped and not refilled.  - - The proper method of use, as well as anticipated side effects, of a metered-dose inhaler are discussed and demonstrated to the patient.  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    ? Allergy/asthma  I spent extra time with pt today reviewing appropriate use of albuterol for prn use on exertion with the following points: 1) saba is for relief of sob that does not improve by walking a slower pace or resting but rather if the pt does not improve after trying this first. 2) If the pt is convinced, as many are, that saba helps recover from activity faster then it's easy to tell if this is the case by re-challenging : ie stop, take the inhaler, then p 5 minutes try the exact same activity (intensity of workload) that just caused the symptoms and see if they are substantially diminished or not after saba 3) if there is an activity that reproducibly causes the  symptoms, try the saba 15 min before the activity on alternate days   If in fact the saba really does help, then fine to continue to use it prn but advised may need to look closer at the maintenance regimen being used to achieve better control of airways disease with exertion eg symbicort 80 2 puffs each am should do all the albuterol is doing but for 12 hours - since has no pm symptoms of doe or noct cough/ wheeze and clearly has a component of uacs (see below) need to just use the minimal dose ICS here)  ? Acid (or non-acid) GERD > always difficult to exclude as up to 75% of pts in some series report no assoc GI/ Heartburn symptoms and she is on bcps with h/o gastroparesis/ wt gain and chronic cough all of  which place her at risk of further gerd related to aging  and can't take ppi per GI (a perfect storm - see uacs)  ? Anxietydepression/ deconditioning assoc with wt gain > usually at the bottom of this list of usual suspects but should be much higher on this pt's based on H and P and note already on psychotropics and may interfere with adherence and also interpretation of response or lack thereof to symptom management which can be quite subjective.   >>> need reconditioning and then cpst with spirometry before and after to sort this out.  ?ABPA  >  IgE  8 rules out   ? Adverse drug effects > none of the usual suspects listed   ? Anemia/ thyroid dz > ruled out today  ? A bunch of PE's > D dimer high nl - while a normal  or high normal value (seen commonly in the elderly or chronically ill)  may miss small peripheral pe, the clot burden with sob is moderately high and a "high nl"  d dimer  has a very high neg pred value if used in this setting.    ? CHF > ruled out by cxr/ bnp so low/ clinical hx

## 2019-11-12 NOTE — Assessment & Plan Note (Addendum)
Onset ? 2005 with assoc sob neg response to saba and symbicort and ? gerd rx (can't use due to [facility] per GI (per pt)    Upper airway cough syndrome (previously labeled PNDS),  is so named because it's frequently impossible to sort out how much is  CR/sinusitis with freq throat clearing (which can be related to primary GERD)   vs  causing  secondary (" extra esophageal")  GERD from wide swings in gastric pressure that occur with throat clearing, often  promoting self use of mint and menthol lozenges that reduce the lower esophageal sphincter tone and exacerbate the problem further in a cyclical fashion.   These are the same pts (now being labeled as having "irritable larynx syndrome" by some cough centers) who not infrequently have a history of having failed to tolerate ace inhibitors,  dry powder inhalers or biphosphonates or report having atypical/extraesophageal reflux symptoms that don't respond to standard doses of PPI  and are easily confused as having aecopd or asthma flares by even experienced allergists/ pulmonologists (myself included).   >>> needs cpst with spirometry before and after to sort out vs asthma/ advised.          Each maintenance medication was reviewed in detail including emphasizing most importantly the difference between maintenance and prns and under what circumstances the prns are to be triggered using an action plan format where appropriate.  Total time for H and P, chart review, counseling, teaching device and generating customized AVS unique to this office visit with High MDM / charting = 60 min

## 2019-11-12 NOTE — Telephone Encounter (Signed)
Called and spoke with Patient.  Dr. Gustavus Bryant results and recommendations given. Understanding stated.  Nothing further at this time.    Per Dr. Melvyn Novas -     Call pt: Reviewed cxr and no acute change so no change in recommendations made at [facility]

## 2019-11-12 NOTE — Progress Notes (Signed)
ATC the pt and her VM was full so unable to leave her msg

## 2019-11-13 NOTE — Progress Notes (Signed)
ATC the pt, NA and her VM was full [facility]

## 2019-11-21 ENCOUNTER — Telehealth: Payer: Self-pay | Admitting: Internal Medicine

## 2019-11-21 NOTE — Telephone Encounter (Signed)
ATC [name], Tourist information centre manager.  LM for [name] to call back.

## 2019-11-21 NOTE — Telephone Encounter (Signed)
Case manager called back. Please return call.

## 2019-11-21 NOTE — Telephone Encounter (Signed)
Called and spoke with Sharyn Lull, Case Manager. OV notes faxed to [employer] at [phone]. Sharyn Lull stated she would send OV notes to adjuster, and fax authorization for PFT, when available. Nothing further at this time.

## 2019-11-24 ENCOUNTER — Other Ambulatory Visit: Payer: Self-pay

## 2019-11-24 DIAGNOSIS — M542 Cervicalgia: Secondary | ICD-10-CM | POA: Diagnosis not present

## 2019-11-24 DIAGNOSIS — G894 Chronic pain syndrome: Secondary | ICD-10-CM | POA: Diagnosis not present

## 2019-11-24 DIAGNOSIS — M5030 Other cervical disc degeneration, unspecified cervical region: Secondary | ICD-10-CM | POA: Diagnosis not present

## 2019-11-24 DIAGNOSIS — R1084 Generalized abdominal pain: Secondary | ICD-10-CM | POA: Diagnosis not present

## 2019-11-24 MED ORDER — ALBUTEROL SULFATE HFA 108 (90 BASE) MCG/ACT IN AERS: 2.0000 | INHALATION_SPRAY | Freq: Four times a day (QID) | RESPIRATORY_TRACT | 3 refills | Status: AC | PRN

## 2019-12-02 ENCOUNTER — Other Ambulatory Visit: Payer: Self-pay

## 2019-12-02 ENCOUNTER — Ambulatory Visit (INDEPENDENT_AMBULATORY_CARE_PROVIDER_SITE_OTHER): Payer: BC Managed Care – PPO | Admitting: Psychiatry

## 2019-12-02 ENCOUNTER — Encounter: Payer: Self-pay | Admitting: Psychiatry

## 2019-12-02 VITALS — BP 146/77 | HR 66 | Ht 61.0 in | Wt 198.0 lb

## 2019-12-02 DIAGNOSIS — F5105 Insomnia due to other mental disorder: Secondary | ICD-10-CM

## 2019-12-02 DIAGNOSIS — F411 Generalized anxiety disorder: Secondary | ICD-10-CM | POA: Diagnosis not present

## 2019-12-02 DIAGNOSIS — F4001 Agoraphobia with panic disorder: Secondary | ICD-10-CM

## 2019-12-02 MED ORDER — BUSPIRONE HCL 15 MG PO TABS: ORAL_TABLET | ORAL | 2 refills | Status: DC
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
## 2019-12-20 DIAGNOSIS — U071 COVID-19: Secondary | ICD-10-CM | POA: Diagnosis not present

## 2019-12-20 DIAGNOSIS — B349 Viral infection, unspecified: Secondary | ICD-10-CM | POA: Diagnosis not present

## 2019-12-23 ENCOUNTER — Emergency Department (HOSPITAL_BASED_OUTPATIENT_CLINIC_OR_DEPARTMENT_OTHER): Payer: BC Managed Care – PPO

## 2019-12-23 ENCOUNTER — Other Ambulatory Visit: Payer: Self-pay

## 2019-12-23 ENCOUNTER — Encounter (HOSPITAL_BASED_OUTPATIENT_CLINIC_OR_DEPARTMENT_OTHER): Payer: Self-pay

## 2019-12-23 ENCOUNTER — Emergency Department (HOSPITAL_BASED_OUTPATIENT_CLINIC_OR_DEPARTMENT_OTHER)
Admission: EM | Admit: 2019-12-23 | Discharge: 2019-12-23 | Disposition: A | Discharge status: Home / Self Care | Payer: BC Managed Care – PPO | Attending: Emergency Medicine | Admitting: Emergency Medicine

## 2019-12-23 DIAGNOSIS — Z79899 Other long term (current) drug therapy: Secondary | ICD-10-CM | POA: Diagnosis not present

## 2019-12-23 DIAGNOSIS — J45909 Unspecified asthma, uncomplicated: Secondary | ICD-10-CM | POA: Insufficient documentation

## 2019-12-23 DIAGNOSIS — R9431 Abnormal electrocardiogram [ECG] [EKG]: Secondary | ICD-10-CM

## 2019-12-23 DIAGNOSIS — U071 COVID-19: Secondary | ICD-10-CM | POA: Diagnosis not present

## 2019-12-23 DIAGNOSIS — E039 Hypothyroidism, unspecified: Secondary | ICD-10-CM | POA: Insufficient documentation

## 2019-12-23 DIAGNOSIS — I10 Essential (primary) hypertension: Secondary | ICD-10-CM | POA: Insufficient documentation

## 2019-12-23 DIAGNOSIS — Z91040 Latex allergy status: Secondary | ICD-10-CM | POA: Insufficient documentation

## 2019-12-23 DIAGNOSIS — R197 Diarrhea, unspecified: Secondary | ICD-10-CM | POA: Diagnosis not present

## 2019-12-23 DIAGNOSIS — R109 Unspecified abdominal pain: Secondary | ICD-10-CM | POA: Diagnosis not present

## 2019-12-23 DIAGNOSIS — G894 Chronic pain syndrome: Secondary | ICD-10-CM | POA: Diagnosis not present

## 2019-12-23 DIAGNOSIS — J40 Bronchitis, not specified as acute or chronic: Secondary | ICD-10-CM | POA: Diagnosis not present

## 2019-12-23 DIAGNOSIS — M542 Cervicalgia: Secondary | ICD-10-CM | POA: Diagnosis not present

## 2019-12-23 DIAGNOSIS — R1110 Vomiting, unspecified: Secondary | ICD-10-CM | POA: Diagnosis not present

## 2019-12-23 DIAGNOSIS — R110 Nausea: Secondary | ICD-10-CM

## 2019-12-23 DIAGNOSIS — R1084 Generalized abdominal pain: Secondary | ICD-10-CM | POA: Diagnosis not present

## 2019-12-23 LAB — URINALYSIS, MICROSCOPIC (REFLEX)

## 2019-12-23 LAB — URINALYSIS, ROUTINE W REFLEX MICROSCOPIC
Glucose, UA: NEGATIVE mg/dL
Hgb urine dipstick: NEGATIVE
Ketones, ur: NEGATIVE mg/dL
Leukocytes,Ua: NEGATIVE
Nitrite: NEGATIVE
Protein, ur: 100 mg/dL — AB
Specific Gravity, Urine: >1.03 — ABNORMAL HIGH (ref 1.005–1.030)
pH: 6 (ref 5.0–8.0)

## 2019-12-23 LAB — CBC WITH DIFFERENTIAL/PLATELET
Abs Immature Granulocytes: 0.01 10*3/uL (ref 0.00–0.07)
Basophils Absolute: 0 10*3/uL (ref 0.0–0.1)
Basophils Relative: 0 %
Eosinophils Absolute: 0 10*3/uL (ref 0.0–0.5)
Eosinophils Relative: 0 %
HCT: 40.5 % (ref 36.0–46.0)
Hemoglobin: 12.9 g/dL (ref 12.0–15.0)
Immature Granulocytes: 0 %
Lymphocytes Relative: 24 %
Lymphs Abs: 0.8 10*3/uL (ref 0.7–4.0)
MCH: 28.5 pg (ref 26.0–34.0)
MCHC: 31.9 g/dL (ref 30.0–36.0)
MCV: 89.4 fL (ref 80.0–100.0)
Monocytes Absolute: 0.3 10*3/uL (ref 0.1–1.0)
Monocytes Relative: 10 %
Neutro Abs: 2 10*3/uL (ref 1.7–7.7)
Neutrophils Relative %: 66 %
Platelets: 224 10*3/uL (ref 150–400)
RBC: 4.53 MIL/uL (ref 3.87–5.11)
RDW: 12.4 % (ref 11.5–15.5)
WBC: 3.1 10*3/uL — ABNORMAL LOW (ref 4.0–10.5)
nRBC: 0 % (ref 0.0–0.2)

## 2019-12-23 LAB — COMPREHENSIVE METABOLIC PANEL
ALT: 28 U/L (ref 0–44)
AST: 38 U/L (ref 15–41)
Albumin: 3.5 g/dL (ref 3.5–5.0)
Alkaline Phosphatase: 69 U/L (ref 38–126)
Anion gap: 10 (ref 5–15)
BUN: 7 mg/dL (ref 6–20)
CO2: 28 mmol/L (ref 22–32)
Calcium: 8.6 mg/dL — ABNORMAL LOW (ref 8.9–10.3)
Chloride: 99 mmol/L (ref 98–111)
Creatinine, Ser: 0.91 mg/dL (ref 0.44–1.00)
GFR calc Af Amer: >60 mL/min (ref >60)
GFR calc non Af Amer: >60 mL/min (ref >60)
Glucose, Bld: 117 mg/dL — ABNORMAL HIGH (ref 70–99)
Potassium: 3.9 mmol/L (ref 3.5–5.1)
Sodium: 137 mmol/L (ref 135–145)
Total Bilirubin: 0.7 mg/dL (ref 0.3–1.2)
Total Protein: 7.5 g/dL (ref 6.5–8.1)

## 2019-12-23 LAB — HCG, QUANTITATIVE, PREGNANCY: hCG, Beta Chain, Quant, S: <1 m[IU]/mL (ref <5)

## 2019-12-23 LAB — LIPASE, BLOOD: Lipase: 21 U/L (ref 11–51)

## 2019-12-23 MED ORDER — SODIUM CHLORIDE 0.9 % IV BOLUS
1000.0000 mL | Freq: Once | INTRAVENOUS | Status: AC
  Administered 2019-12-23: 1000 mL via INTRAVENOUS

## 2019-12-23 MED ORDER — METOCLOPRAMIDE HCL 5 MG/ML IJ SOLN
5.0000 mg | Freq: Once | INTRAMUSCULAR | Status: AC
  Administered 2019-12-23: 5 mg via INTRAVENOUS
  Filled 2019-12-23: qty 2

## 2019-12-23 MED ORDER — DEXAMETHASONE SODIUM PHOSPHATE 10 MG/ML IJ SOLN
8.0000 mg | Freq: Once | INTRAMUSCULAR | Status: AC
  Administered 2019-12-23: 8 mg via INTRAVENOUS
  Filled 2019-12-23: qty 1

## 2019-12-23 NOTE — ED Notes (Signed)
Per EDP order, pt given fluids and/or food for PO challenge. Pt verbalized understanding to utilize call bell if nausea or emesis occur. 

## 2019-12-23 NOTE — Discharge Instructions (Addendum)
You were given steroid here in the ED for your COVID infection.  Did have a prolonged QT interval on your EKG.  You need to have this followed up by your primary care provider.  Be careful if you take any antiemetic medication or antibiotics as this may prolong her QT interval.  I would recommend follow-up with your GI doctor for a stool sample given your history of diarrhea and C. difficile.  This may be related to your Covid infection or the antibiotics however it be best to rule this out given you are not able to provide a sample here in the emergency department  If you develop severe abdominal pain, unable to tolerate any liquids at home please seek reevaluation

## 2019-12-23 NOTE — ED Provider Notes (Signed)
[facility] Provider Note   CSN: [id number] Arrival date & time: 12/23/19  1324   History Chief Complaint  Patient presents with  . Dehydration   [name] is a 49 y.o. female with past medical history significant for anxiety, fibromyalgia, asthma, C. difficile, chronic dyspnea on exertion followed by Dr. Melvyn Novas, recently diagnosed with [facility] at [facility], 4 days PTA who presents for evaluation of nausea and diarrhea.  States she was given azithromycin for her COVID by [facility].  Patient states she started with diarrhea soon after taking it.  Patient with 5-6 episodes of diarrhea daily.  No emesis however persistent nausea.  States she feels dehydrated.  She has a nonproductive cough.  No associated abdominal pain.  Has been running intermittent fevers at home having to take Tylenol.  Tylenol approximately noon today.  Body aches.  No headache, lightheadedness, chest pain, shortness of breath, hemoptysis, unilateral leg swelling, redness, warmth.  No prior history of PE or DVT.  Dates she did start taking a medication prescribed to her by GI whenever she starts taking antibiotics.  History of fecal transplant by [facility] approximately 9 years ago.  Denies additional aggravating or alleviating factors.  History obtained from patient and past medical records.  No interpreter is used.  HPI     Past Medical History:  Diagnosis Date  . Anxiety   . Arrhythmia    heart races when pt in pain  . Asthma   . Clostridium difficile infection 2014  . [place] Patient virus infection   . Fibromyalgia   . GERD (gastroesophageal reflux disease)   . Hypertension   . Hypothyroidism   . Immune deficiency disorder ([place])   . Lyme disease   . Mold contact confirmed    Toxic Mold syndrome  . Mononucleosis 5-10  . Ovarian cyst    Serous Cystadenofibroma-Left ovary  . PONV (postoperative nausea and vomiting)   . Shortness of breath    on exertion  .  Systolic murmur   . Thyroid disease     Patient Active Problem List   Diagnosis Date Noted  . Upper airway cough syndrome vs VCD vs asthma 11/12/2019  . Personal history of endometriosis 11/30/2016  . Morbid obesity ([place]) 07/03/2014  . DOE (dyspnea on exertion) 07/02/2014  . Clostridium difficile infection   . GASTROPARESIS 06/15/2009  . INSOMNIA 05/12/2009  . EPIGASTRIC PAIN 04/15/2009  . INFECTIOUS MONONUCLEOSIS 03/10/2009  . TACHYCARDIA 03/10/2009  . ADENOMATOUS COLONIC POLYP 12/05/2007  . FUNGAL DERMATITIS 11/30/2006  . HYPERLIPIDEMIA 11/30/2006  . PANIC DISORDER 11/30/2006  . ASTHMA 11/30/2006  . GERD 11/30/2006  . IRRITABLE BOWEL SYNDROME 11/30/2006  . INTERSTITIAL CYSTITIS 11/30/2006  . MYOFASCIAL PAIN SYNDROME 11/30/2006  . CHRONIC FATIGUE SYNDROME 11/30/2006  . HEADACHE 11/30/2006    Past Surgical History:  Procedure Laterality Date  . BLADDER SURGERY    . CHOLECYSTECTOMY    . FECAL TRANSPLANT    . Groin cyst    . LAPAROSCOPY Right 06/12/2013   Procedure: LAPAROSCOPY OPERATIVE  fulgeration of endometriosis, right salpingooophorectomy;  Surgeon: Terrance Mass, MD;  Location: [facility];  Service: Gynecology;  Laterality: Right;  . Nose cyst    . OOPHORECTOMY Right 06/12/2013   Procedure: OOPHORECTOMY;POSS RIGHT SALPING OOPHORECTOMY;  Surgeon: Terrance Mass, MD;  Location: [facility];  Service: Gynecology;  Laterality: Right;  . PELVIC LAPAROSCOPY     DL Ovarian cystectomy-Left  . TONSILLECTOMY  2009  OB History    Gravida  0   Para      Term      Preterm      AB      Living        SAB      TAB      Ectopic      Multiple      Live Births              Family History  Problem Relation Age of Onset  . Hypertension Mother   . Diabetes Mother   . Pulmonary embolism Mother        x2  . Breast cancer Maternal Aunt        Age 18  . Pancreatic cancer Maternal Grandmother   . Heart disease Maternal Grandfather   . Breast cancer Paternal  Grandmother        Age 62  . Asthma Paternal Grandmother     Social History   Tobacco Use  . Smoking status: Never Smoker  . Smokeless tobacco: Never Used  Substance Use Topics  . Alcohol use: No    Alcohol/week: 0.0 standard drinks  . Drug use: No    Home Medications Prior to Admission medications   Medication Sig Start Date End Date Taking? Authorizing Provider  albuterol (PROAIR HFA) 108 (90 Base) MCG/ACT inhaler Inhale 2 puffs into the lungs every 6 (six) hours as needed for wheezing or shortness of breath. 11/24/19   Tanda Rockers, MD  ALPRAZolam (XANAX XR) 0.5 MG 24 hr tablet Take 0.5 mg by mouth in the morning and at bedtime.    [provider]  amLODipine (NORVASC) 2.5 MG tablet Take 1 tablet (2.5 mg total) by mouth daily. NEED OV. 04/29/18   Croitoru, Mihai, MD  bisoprolol (ZEBETA) 10 MG tablet Take 10 mg by mouth daily.    [provider]  Buprenorphine HCl (BELBUCA) 450 MCG FILM Place 1 Film inside cheek in the morning and at bedtime.    [provider]  busPIRone (BUSPAR) 15 MG tablet 1/3 tablet twice daily for 1 week, then 2/3 twice daily for 1 week, then 1 twice daily 12/02/19   Cottle, Billey Co., MD  cetirizine (ZYRTEC) 10 MG tablet Take by mouth.    [provider]  Cholecalciferol (VITAMIN D PO) Take 1 tablet by mouth daily.     [provider]  citalopram (CELEXA) 40 MG tablet Take 40 mg by mouth daily.    [provider]  colesevelam (WELCHOL) 625 MG tablet Take 625 mg by mouth 2 (two) times daily with a meal.    [provider]  fluticasone (FLONASE) 50 MCG/ACT nasal spray Place 1 spray into both nostrils daily.    [provider]  HYOSCYAMINE PO Take 0.125 mg by mouth daily.     [provider]  Ibuprofen-Famotidine (DUEXIS) 800-26.6 MG TABS Take by mouth. 10/14/19 01/12/20  [provider]  Melatonin 10 MG TABS Take by mouth.    [provider]  methocarbamol  (ROBAXIN) 500 MG tablet Take 1 tablet (500 mg total) by mouth 3 (three) times daily. 09/19/18   Pete Pelt, PA-C  norethindrone-ethinyl estradiol (JUNEL FE 1/20) 1-20 MG-MCG tablet Take 1 tablet by mouth daily. Continuous use for Dysmenorrhea 06/16/19   Princess Bruins, MD  Probiotic Product (ALIGN PO) Take by mouth as needed.    [provider]  prochlorperazine (COMPAZINE) 10 MG tablet Take 10 mg by mouth  every 6 (six) hours as needed.    [provider]  promethazine (PHENERGAN) 25 MG tablet Take 25 mg by mouth every 6 (six) hours as needed.    [provider]  rOPINIRole (REQUIP) 1 MG tablet Take 1 mg by mouth 3 (three) times daily.    [provider]  telmisartan (MICARDIS) 80 MG tablet Take 80 mg by mouth daily.    [provider]  thyroid (ARMOUR) 30 MG tablet Take 30 mg by mouth daily before breakfast.    [provider]  vitamin B-12 (CYANOCOBALAMIN) 500 MCG tablet Take 500 mcg by mouth daily.    [provider]    Allergies    Allegra [fexofenadine hcl], Fexofenadine, Oseltamivir, Tamiflu [oseltamivir phosphate], Vancomycin, Diphenhydramine, Latex, Sulfamethoxazole, Doxycycline, Other, Diphenhydramine hcl, Morphine, Oseltamivir phosphate, Penicillins, and Sulfonamide derivatives  Review of Systems   Review of Systems  Constitutional: Positive for appetite change, chills, fatigue and fever.  HENT: Negative.   Respiratory: Positive for cough. Negative for apnea, choking, chest tightness, shortness of breath, wheezing and stridor.   Cardiovascular: Negative.   Gastrointestinal: Positive for diarrhea and nausea. Negative for abdominal distention, abdominal pain, anal bleeding, blood in stool, constipation, rectal pain and vomiting.  Genitourinary: Negative.   Musculoskeletal: Positive for myalgias. Negative for arthralgias, back pain, gait problem, joint swelling, neck pain and neck stiffness.  Skin: Negative.    Neurological: Negative.   All other systems reviewed and are negative.   Physical Exam Updated Vital Signs BP 123/72 (BP Location: Right Arm)   Pulse 70   Temp 98.4 F (36.9 C) (Oral)   Resp (!) 25   Ht 5\' 1"  (1.549 m)   Wt 91.2 kg   SpO2 96%   BMI 37.98 kg/m   Physical Exam Vitals and nursing note reviewed.  Constitutional:      General: She is not in acute distress.    Appearance: She is not ill-appearing, toxic-appearing or diaphoretic.  HENT:     Head: Normocephalic and atraumatic.     Jaw: There is normal jaw occlusion.     Right Ear: Tympanic membrane, ear canal and external ear normal. There is no impacted cerumen. No hemotympanum. Tympanic membrane is not injected, scarred, perforated, erythematous, retracted or bulging.     Left Ear: Tympanic membrane, ear canal and external ear normal. There is no impacted cerumen. No hemotympanum. Tympanic membrane is not injected, scarred, perforated, erythematous, retracted or bulging.     Ears:     Comments: No Mastoid tenderness.    Nose:     Comments: Clear rhinorrhea and congestion to bilateral nares.  No sinus tenderness.    Mouth/Throat:     Mouth: Mucous membranes are moist.     Comments: Posterior oropharynx clear.  Mucous membranes moist.  Tonsils without erythema or exudate.  Uvula midline without deviation.  No evidence of PTA or RPA.  No drooling, dysphasia or trismus.  Phonation normal. Neck:     Trachea: Trachea and phonation normal.     Meningeal: Brudzinski's sign and Kernig's sign absent.     Comments: No Neck stiffness or neck rigidity.  No meningismus.  No cervical lymphadenopathy. Cardiovascular:     Rate and Rhythm: Normal rate.     Pulses: Normal pulses.     Comments: No murmurs rubs or gallops. Pulmonary:     Effort: Pulmonary effort is normal.     Breath sounds: Normal breath sounds.     Comments: Clear to auscultation bilaterally without  wheeze, rhonchi or rales.  No accessory muscle usage.  Able  speak in full sentences. Abdominal:     General: Bowel sounds are normal. There is no distension.     Tenderness: There is no abdominal tenderness. There is no right CVA tenderness, left CVA tenderness or guarding.     Comments: Soft, nontender without rebound or guarding.  No CVA tenderness.  Musculoskeletal:        General: No swelling, tenderness or deformity. Normal range of motion.     Right lower leg: No edema.     Left lower leg: No edema.     Comments: Moves all 4 extremities without difficulty.  Lower extremities without edema, erythema or warmth.  Skin:    General: Skin is warm.     Capillary Refill: Capillary refill takes less than 2 seconds.     Comments: Brisk capillary refill.  No rashes or lesions.  Neurological:     General: No focal deficit present.     Mental Status: She is alert and oriented to person, place, and time.     Comments: Ambulatory in department without difficulty.  Cranial nerves II through XII grossly intact.  No facial droop.  No aphasia.     ED Results / Procedures / Treatments   Labs (all labs ordered are listed, but only abnormal results are displayed) Labs Reviewed  CBC WITH DIFFERENTIAL/PLATELET - Abnormal; Notable for the following components:      Result Value   WBC 3.1 (*)    All other components within normal limits  COMPREHENSIVE METABOLIC PANEL - Abnormal; Notable for the following components:   Glucose, Bld 117 (*)    Calcium 8.6 (*)    All other components within normal limits  URINALYSIS, ROUTINE W REFLEX MICROSCOPIC - Abnormal; Notable for the following components:   Color, Urine AMBER (*)    Specific Gravity, Urine >1.030 (*)    Bilirubin Urine SMALL (*)    Protein, ur 100 (*)    All other components within normal limits  URINALYSIS, MICROSCOPIC (REFLEX) - Abnormal; Notable for the following components:   Bacteria, UA FEW (*)    All other components within normal limits  C DIFFICILE QUICK SCREEN W PCR REFLEX   GASTROINTESTINAL PANEL BY PCR, STOOL (REPLACES STOOL CULTURE)  LIPASE, BLOOD  HCG, QUANTITATIVE, PREGNANCY    EKG EKG Interpretation  Date/Time:  Tuesday December 23 2019 15:38:08 EDT Ventricular Rate:  67 PR Interval:    QRS Duration: 75 QT Interval:  513 QTC Calculation: 542 R Axis:   82 Text Interpretation: Sinus rhythm Borderline T abnormalities, anterior leads Prolonged QT interval no significant change since tracing earlier today Confirmed by Theotis Burrow [phone]) on 12/23/2019 4:49:56 PM   Radiology DG Abdomen Acute W/Chest  Result Date: 12/23/2019 CLINICAL DATA:  Cough, abdominal pain and vomiting. High fever. COVID-19 positive test 3 days ago. EXAM: DG ABDOMEN ACUTE W/ 1V CHEST COMPARISON:  07/12/2009 FINDINGS: Normal sized heart. Minimal linear atelectasis or scarring in the left lower lung zone and minimal patchy density at the right lateral lung base. Mild peribronchial thickening. Normal bowel gas pattern without free peritoneal air. Cholecystectomy clips. Unremarkable bones. IMPRESSION: 1. Minimal patchy atelectasis or pneumonia at the right lateral lung base. 2. Mild bronchitic changes. 3. No acute abdominal abnormality. Electronically Signed   By: Claudie Revering M.D.   On: 12/23/2019 16:34    Procedures Procedures (including critical care time)  Medications Ordered in ED Medications  dexamethasone (DECADRON) injection 8  mg (has no administration in time range)  sodium chloride 0.9 % bolus 1,000 mL ( Intravenous Stopped 12/23/19 1552)  metoCLOPramide (REGLAN) injection 5 mg (5 mg Intravenous Given 12/23/19 1501)  sodium chloride 0.9 % bolus 1,000 mL (1,000 mLs Intravenous New Bag/Given 12/23/19 1555)    ED Course  I have reviewed the triage vital signs and the nursing notes.  Pertinent labs & imaging results that were available during my care of the patient were reviewed by me and considered in my medical decision making (see chart for details).  49 year old female  presents for evaluation of nausea and diarrhea in setting of recent Covid infection.  [facility] ago.  Was started on azithromycin at that time.  Unfortunately patient has history of recurrent C. difficile with antibiotic use.  Was given medicine by GI to take whenever she starts taking antibiotics however now with 4-5 episodes of nonmelanotic, nonbloody diarrhea.  Associated nausea without emesis.  Has history of gastroparesis.  She is afebrile, nonseptic, non-ill-appearing here.  Clinically she does not look dehydrated.  Heart and lungs are clear.  No evidence of DVT on exam.  She has no tachycardia, tachypnea or hypoxia.  Abdomen soft, nontender.  We will plan on IV fluids, labs and reassess.  She is able to give [place] a stool sample we will will run this for C. difficile given her history.  EKG with prolonged QT interval, repeat EKG with improvement however prolonged.  She has been on azithromycin.  Discussed sensation of this as his medication is known to prolong the QT interval.  We will also have her hold on antiemetics as this can prolong her QT interval as well.  CBC with leukopenia 3.1 consistent with Covid Metabolic panel with mild hyperglycemia mild hypocalcemia at 8.6 however no additional electrolyte, renal abnormality Pregnancy test negative Lipase 21 UA negative for infection Chest x-ray shows patchy atelectasis versus infection, suspect viral pneumonia given known Covid  Patient reassessed.  Tolerating p.o. intake without difficulty.  Reevaluation abdomen soft, nontender.  Without surgical abdomen.  Pending stool sample  Patient reassessed.  Continues to tolerate p.o. intake however has not been able to provide [place] with stool sample throughout her 5-hour ED stay.  We will send her home with container and have her follow-up with GI who she is followed closely for her prior C. difficile infections.  Patient is nontoxic, nonseptic appearing, in no apparent  distress.  Patient's pain and other symptoms adequately managed in emergency department.  Fluid bolus given.  Labs, imaging and vitals reviewed.  Patient does not meet the SIRS or Sepsis criteria.  On repeat exam patient does not have a surgical abdomin and there are no peritoneal signs.  No indication of appendicitis, bowel obstruction, bowel perforation, cholecystitis, diverticulitis, PID or ectopic pregnancy.   The patient has been appropriately medically screened and/or stabilized in the ED. I have low suspicion for any other emergent medical condition which would require further screening, evaluation or treatment in the ED or require inpatient management.  Patient is hemodynamically stable and in no acute distress.  Patient able to ambulate in department prior to ED.  Evaluation does not show acute pathology that would require ongoing or additional emergent interventions while in the emergency department or further inpatient treatment.  I have discussed the diagnosis with the patient and answered all questions.  Pain is been managed while in the emergency department and patient has no further complaints prior to discharge.  Patient is comfortable with plan discussed in room and is stable for discharge at this time.  I have discussed strict return precautions for returning to the emergency department.  Patient was encouraged to follow-up with PCP/specialist refer to at discharge.    MDM Rules/Calculators/A&P                      [name] was evaluated in Emergency Department on 12/23/2019 for the symptoms described in the history of present illness. She was evaluated in the context of the global COVID-19 pandemic, which necessitated consideration that the patient might be at risk for infection with the SARS-CoV-2 virus that causes COVID-19. Institutional protocols and algorithms that pertain to the evaluation of patients at risk for COVID-19 are in a state of rapid change based on information  released by regulatory bodies including the CDC and federal and state organizations. These policies and algorithms were followed during the patient's care in the ED. Final Clinical Impression(s) / ED Diagnoses Final diagnoses:  Diarrhea, unspecified type  Nausea  Prolonged QT interval  COVID-19 virus infection    Rx / DC Orders ED Discharge Orders    None       Arie Powell A, PA-C 12/23/19 1823    Hayden Rasmussen, MD 12/24/19 [phone]

## 2019-12-23 NOTE — ED Triage Notes (Signed)
Pt states that she testeed [facility]id + on [facility] at [facility] in [place], reports that she thinks she is dehydrated r/t high fever at home around 100-102. States she last took tylenol at 1245 today temp in triage is 98.4. C/O body aches, coughing, NVD.

## 2019-12-23 NOTE — ED Notes (Signed)
Attempted to obtain cdiff sample but PT stated they need something to eat before they could void, bedside commode and supplies at bedside and PT informed that results must be back before she could eat

## 2019-12-24 ENCOUNTER — Encounter: Payer: Self-pay | Admitting: Primary Care

## 2019-12-24 ENCOUNTER — Other Ambulatory Visit: Payer: Self-pay | Admitting: Nurse Practitioner

## 2019-12-24 ENCOUNTER — Telehealth: Payer: Self-pay | Admitting: Internal Medicine

## 2019-12-24 ENCOUNTER — Ambulatory Visit (INDEPENDENT_AMBULATORY_CARE_PROVIDER_SITE_OTHER): Payer: BC Managed Care – PPO | Admitting: Primary Care

## 2019-12-24 DIAGNOSIS — R0600 Dyspnea, unspecified: Secondary | ICD-10-CM

## 2019-12-24 DIAGNOSIS — E6609 Other obesity due to excess calories: Secondary | ICD-10-CM

## 2019-12-24 DIAGNOSIS — U071 COVID-19: Secondary | ICD-10-CM

## 2019-12-24 DIAGNOSIS — R0609 Other forms of dyspnea: Secondary | ICD-10-CM

## 2019-12-24 DIAGNOSIS — J1282 Pneumonia due to coronavirus disease 2019: Secondary | ICD-10-CM

## 2019-12-24 MED ORDER — SODIUM CHLORIDE 0.9 % IV SOLN
Freq: Once | INTRAVENOUS | Status: AC
  Filled 2019-12-24: qty 700

## 2019-12-24 MED ORDER — PREDNISONE 10 MG PO TABS: ORAL_TABLET | ORAL | 0 refills | Status: DC

## 2019-12-24 NOTE — Progress Notes (Signed)
  I connected by phone with [name] on 12/24/2019 at 3:19 PM to discuss the potential use of an new treatment for mild to moderate COVID-19 viral infection in non-hospitalized patients.  This patient is a 49 y.o. female that meets the FDA criteria for Emergency Use Authorization of bamlanivimab/etesevimab or casirivimab/imdevimab.  Has a (+) direct SARS-CoV-2 viral test result  Has mild or moderate COVID-19   Is ? 49 years of age and weighs ? 40 kg  Is NOT hospitalized due to COVID-19  Is NOT requiring oxygen therapy or requiring an increase in baseline oxygen flow rate due to COVID-19  Is within 10 days of symptom onset  Has at least one of the high risk factor(s) for progression to severe COVID-19 and/or hospitalization as defined in EUA.  Specific high risk criteria : BMI >/= 35   I have spoken and communicated the following to the patient or parent/caregiver:  1. FDA has authorized the emergency use of bamlanivimab/etesevimab and casirivimab\imdevimab for the treatment of mild to moderate COVID-19 in adults and pediatric patients with positive results of direct SARS-CoV-2 viral testing who are 10 years of age and older weighing at least 40 kg, and who are at high risk for progressing to severe COVID-19 and/or hospitalization.  2. The significant known and potential risks and benefits of bamlanivimab/etesevimab and casirivimab\imdevimab, and the extent to which such potential risks and benefits are unknown.  3. Information on available alternative treatments and the risks and benefits of those alternatives, including clinical trials.  4. Patients treated with bamlanivimab/etesevimab and casirivimab\imdevimab should continue to self-isolate and use infection control measures (e.g., wear mask, isolate, social distance, avoid sharing personal items, clean and disinfect "high touch" surfaces, and frequent handwashing) according to CDC guidelines.   5. The patient or  parent/caregiver has the option to accept or refuse bamlanivimab/etesevimab or casirivimab\imdevimab .  After reviewing this information with the patient, The patient agreed to proceed with receiving the bamlanimivab infusion and will be provided a copy of the Fact sheet prior to receiving the infusion.[name] 12/24/2019 3:19 PM

## 2019-12-24 NOTE — Progress Notes (Signed)
Virtual Visit via Video Note  I connected with [name] on 12/24/19 at  4:30 PM EDT by a video enabled telemedicine application and verified that I am speaking with the correct person using two identifiers.  Location: Patient: Home Provider: Office   I discussed the limitations of evaluation and management by telemedicine and the availability of in person appointments. The patient expressed understanding and agreed to proceed.  History of Present Illness: 49 year old female. PMH significant for asthma, obesity, GERD, hyperlipidemia. Patient of Dr. Melvyn Novas, last seen on 11/11/19. Maintained on prn albuterol. Allergy profile 11/11/2019 >  Eos 0.3 /  IgE  8  12/24/2019 Patient contacted today for televisit. She tested positive for covid on Saturday 12/20/19. She developed flu-like symptoms that same day and went to [facility] Her family has also recently all tested positive but do not have acute symptom like she does. She recently went to the emergency room at the [facility] in [place] and she received two bags of IVF and decadron 8mg . Her CXR showed patchy atelectasis versus infection, suspected viral pneumonia given known covid. CBC showed leukopenia 3.1 consistent with covid. UA negative for infection. She continues to have flu like symptoms including diarrhea, nausea and vomitting. She is able to take in oral liquids. She has intermittent fevers which improve with tylenol. Tmax 102. We discussed possibly her meeting the screening requirements for monoclonal antibodies. She is interested in this. She also reported improvement in asthma symptoms with IV steriods. Will send in steriod taper. She has not had the covid vaccine.    Observations/Objective:  - No overt shortness of breath, wheezing or cough noted during phone coversation  Assessment and Plan:  Covid pneumonia: - Tested positive for COVID-19 on 4/17 after developing flu like symptoms that same day  - Continues to have diarrhea, nausea,  vomiting and fever  - Patient to be screened by covid team to see if she qualifies for monoclonal antibody infusion  - RX prednisone taper (40mg  x 2 days; 30mg  x 2 days; 20mg  x 2 days; 10mg  x 2 days) - Encourage oral fluids, BRAT diet and tylenol 650mg  q 4-6 hours prn fever  - Repeat CXR in 4-6 weeks   Follow Up Instructions:  Follow-up:  4-6 weeks in [place] office with CXR prior   I discussed the assessment and treatment plan with the patient. The patient was provided an opportunity to ask questions and all were answered. The patient agreed with the plan and demonstrated an understanding of the instructions.   The patient was advised to call back or seek an in-person evaluation if the symptoms worsen or if the condition fails to improve as anticipated.  I provided 22 minutes of non-face-to-face time during this encounter.   Beth Ehrich, NP

## 2019-12-24 NOTE — Patient Instructions (Addendum)
Patient will be contacted by our covid team to screen for monoclonal antibody infusion  Orders: CXR in 4-6 re: covid pneumonia   Follow-up:  4-6 weeks in [place] office

## 2019-12-24 NOTE — Telephone Encounter (Signed)
Patient called office stating she has been tested positive for covid.  PCP told patient to contact pulmonary.  Patient stated she was told she had mild pna. Patient has temps of 100-102, n/v/d.  Patient is interested in possible infusion, and any recommendations. Patient scheduled today with Eustaquio Maize, NP, telephone visit. Patient is aware she will receive a link for visit with Beth. Nothing further at this time.

## 2019-12-24 NOTE — Addendum Note (Signed)
Addended by: Vanessa Barbara on: 12/24/2019 02:23 PM   Modules accepted: Orders

## 2019-12-25 ENCOUNTER — Ambulatory Visit (HOSPITAL_COMMUNITY)
Admission: RE | Admit: 2019-12-25 | Discharge: 2019-12-25 | Disposition: A | Discharge status: Home / Self Care | Payer: BC Managed Care – PPO | Source: Ambulatory Visit | Attending: Pulmonary Disease | Admitting: Pulmonary Disease

## 2019-12-25 ENCOUNTER — Other Ambulatory Visit (HOSPITAL_COMMUNITY): Payer: Self-pay

## 2019-12-25 DIAGNOSIS — Z6837 Body mass index (BMI) 37.0-37.9, adult: Secondary | ICD-10-CM | POA: Insufficient documentation

## 2019-12-25 DIAGNOSIS — U071 COVID-19: Secondary | ICD-10-CM | POA: Insufficient documentation

## 2019-12-25 DIAGNOSIS — E6609 Other obesity due to excess calories: Secondary | ICD-10-CM | POA: Insufficient documentation

## 2019-12-25 LAB — GASTROINTESTINAL PANEL BY PCR, STOOL (REPLACES STOOL CULTURE)

## 2019-12-25 MED ORDER — DIPHENHYDRAMINE HCL 50 MG/ML IJ SOLN: 50.0000 mg | Freq: Once | INTRAMUSCULAR | Status: DC | PRN

## 2019-12-25 MED ORDER — METHYLPREDNISOLONE SODIUM SUCC 125 MG IJ SOLR: 125.0000 mg | Freq: Once | INTRAMUSCULAR | Status: DC | PRN

## 2019-12-25 MED ORDER — FAMOTIDINE IN NACL 20-0.9 MG/50ML-% IV SOLN: 20.0000 mg | Freq: Once | INTRAVENOUS | Status: DC | PRN

## 2019-12-25 MED ORDER — EPINEPHRINE 0.3 MG/0.3ML IJ SOAJ: 0.3000 mg | Freq: Once | INTRAMUSCULAR | Status: DC | PRN

## 2019-12-25 MED ORDER — ALBUTEROL SULFATE HFA 108 (90 BASE) MCG/ACT IN AERS: 2.0000 | INHALATION_SPRAY | Freq: Once | RESPIRATORY_TRACT | Status: DC | PRN

## 2019-12-25 MED ORDER — SODIUM CHLORIDE 0.9 % IV SOLN: INTRAVENOUS | Status: DC | PRN

## 2019-12-25 NOTE — Progress Notes (Signed)
  Diagnosis: COVID-19  Physician: Dr. Joya Gaskins  Procedure: [facility] Med: bamlanivimab\etesevimab infusion - Provided patient with bamlanimivab\etesevimab fact sheet for patients, parents and caregivers prior to infusion.  Complications: No immediate complications noted.  Discharge: Discharged home   [name] 12/25/2019

## 2019-12-25 NOTE — Discharge Instructions (Signed)

## 2019-12-26 ENCOUNTER — Inpatient Hospital Stay (HOSPITAL_COMMUNITY)
Admission: EM | Admit: 2019-12-26 | Discharge: 2019-12-30 | DRG: 177 | Disposition: A | Discharge status: Home / Self Care | Payer: BC Managed Care – PPO | Attending: Internal Medicine | Admitting: Internal Medicine

## 2019-12-26 ENCOUNTER — Encounter (HOSPITAL_COMMUNITY): Payer: Self-pay | Admitting: Internal Medicine

## 2019-12-26 ENCOUNTER — Emergency Department (HOSPITAL_COMMUNITY): Payer: BC Managed Care – PPO

## 2019-12-26 ENCOUNTER — Other Ambulatory Visit: Payer: Self-pay

## 2019-12-26 DIAGNOSIS — Z881 Allergy status to other antibiotic agents status: Secondary | ICD-10-CM

## 2019-12-26 DIAGNOSIS — G8929 Other chronic pain: Secondary | ICD-10-CM | POA: Diagnosis present

## 2019-12-26 DIAGNOSIS — K3184 Gastroparesis: Secondary | ICD-10-CM | POA: Diagnosis present

## 2019-12-26 DIAGNOSIS — Z91018 Allergy to other foods: Secondary | ICD-10-CM

## 2019-12-26 DIAGNOSIS — Z888 Allergy status to other drugs, medicaments and biological substances status: Secondary | ICD-10-CM

## 2019-12-26 DIAGNOSIS — I10 Essential (primary) hypertension: Secondary | ICD-10-CM | POA: Diagnosis not present

## 2019-12-26 DIAGNOSIS — Z880 Allergy status to penicillin: Secondary | ICD-10-CM | POA: Diagnosis not present

## 2019-12-26 DIAGNOSIS — Z885 Allergy status to narcotic agent status: Secondary | ICD-10-CM

## 2019-12-26 DIAGNOSIS — F419 Anxiety disorder, unspecified: Secondary | ICD-10-CM | POA: Diagnosis present

## 2019-12-26 DIAGNOSIS — J1282 Pneumonia due to coronavirus disease 2019: Secondary | ICD-10-CM | POA: Diagnosis present

## 2019-12-26 DIAGNOSIS — Z882 Allergy status to sulfonamides status: Secondary | ICD-10-CM

## 2019-12-26 DIAGNOSIS — Z8249 Family history of ischemic heart disease and other diseases of the circulatory system: Secondary | ICD-10-CM

## 2019-12-26 DIAGNOSIS — Z90721 Acquired absence of ovaries, unilateral: Secondary | ICD-10-CM

## 2019-12-26 DIAGNOSIS — Z86010 Personal history of colonic polyps: Secondary | ICD-10-CM

## 2019-12-26 DIAGNOSIS — Z7952 Long term (current) use of systemic steroids: Secondary | ICD-10-CM

## 2019-12-26 DIAGNOSIS — Z23 Encounter for immunization: Secondary | ICD-10-CM | POA: Diagnosis not present

## 2019-12-26 DIAGNOSIS — E039 Hypothyroidism, unspecified: Secondary | ICD-10-CM | POA: Diagnosis not present

## 2019-12-26 DIAGNOSIS — R0602 Shortness of breath: Secondary | ICD-10-CM | POA: Diagnosis not present

## 2019-12-26 DIAGNOSIS — K589 Irritable bowel syndrome without diarrhea: Secondary | ICD-10-CM | POA: Diagnosis present

## 2019-12-26 DIAGNOSIS — U071 COVID-19: Principal | ICD-10-CM | POA: Diagnosis present

## 2019-12-26 DIAGNOSIS — Z833 Family history of diabetes mellitus: Secondary | ICD-10-CM

## 2019-12-26 DIAGNOSIS — K219 Gastro-esophageal reflux disease without esophagitis: Secondary | ICD-10-CM | POA: Diagnosis present

## 2019-12-26 DIAGNOSIS — Z803 Family history of malignant neoplasm of breast: Secondary | ICD-10-CM | POA: Diagnosis not present

## 2019-12-26 DIAGNOSIS — Z79899 Other long term (current) drug therapy: Secondary | ICD-10-CM

## 2019-12-26 DIAGNOSIS — Z808 Family history of malignant neoplasm of other organs or systems: Secondary | ICD-10-CM

## 2019-12-26 DIAGNOSIS — Z791 Long term (current) use of non-steroidal anti-inflammatories (NSAID): Secondary | ICD-10-CM

## 2019-12-26 DIAGNOSIS — M797 Fibromyalgia: Secondary | ICD-10-CM | POA: Diagnosis not present

## 2019-12-26 DIAGNOSIS — Z9049 Acquired absence of other specified parts of digestive tract: Secondary | ICD-10-CM | POA: Diagnosis not present

## 2019-12-26 DIAGNOSIS — Z6837 Body mass index (BMI) 37.0-37.9, adult: Secondary | ICD-10-CM | POA: Diagnosis not present

## 2019-12-26 DIAGNOSIS — E669 Obesity, unspecified: Secondary | ICD-10-CM | POA: Diagnosis present

## 2019-12-26 DIAGNOSIS — A0839 Other viral enteritis: Secondary | ICD-10-CM | POA: Diagnosis not present

## 2019-12-26 DIAGNOSIS — Z886 Allergy status to analgesic agent status: Secondary | ICD-10-CM

## 2019-12-26 DIAGNOSIS — J9601 Acute respiratory failure with hypoxia: Secondary | ICD-10-CM | POA: Diagnosis present

## 2019-12-26 DIAGNOSIS — Z91040 Latex allergy status: Secondary | ICD-10-CM

## 2019-12-26 DIAGNOSIS — F410 Panic disorder [episodic paroxysmal anxiety] without agoraphobia: Secondary | ICD-10-CM | POA: Diagnosis present

## 2019-12-26 DIAGNOSIS — J45909 Unspecified asthma, uncomplicated: Secondary | ICD-10-CM | POA: Diagnosis present

## 2019-12-26 DIAGNOSIS — E785 Hyperlipidemia, unspecified: Secondary | ICD-10-CM | POA: Diagnosis present

## 2019-12-26 LAB — CBC WITH DIFFERENTIAL/PLATELET
Abs Immature Granulocytes: 0.03 10*3/uL (ref 0.00–0.07)
Basophils Absolute: 0 10*3/uL (ref 0.0–0.1)
Basophils Relative: 0 %
Eosinophils Absolute: 0 10*3/uL (ref 0.0–0.5)
Eosinophils Relative: 0 %
HCT: 40.9 % (ref 36.0–46.0)
Hemoglobin: 12.8 g/dL (ref 12.0–15.0)
Immature Granulocytes: 0 %
Lymphocytes Relative: 14 %
Lymphs Abs: 1 10*3/uL (ref 0.7–4.0)
MCH: 28.4 pg (ref 26.0–34.0)
MCHC: 31.3 g/dL (ref 30.0–36.0)
MCV: 90.9 fL (ref 80.0–100.0)
Monocytes Absolute: 0.2 10*3/uL (ref 0.1–1.0)
Monocytes Relative: 2 %
Neutro Abs: 5.9 10*3/uL (ref 1.7–7.7)
Neutrophils Relative %: 84 %
Platelets: 202 10*3/uL (ref 150–400)
RBC: 4.5 MIL/uL (ref 3.87–5.11)
RDW: 12.7 % (ref 11.5–15.5)
WBC: 7.1 10*3/uL (ref 4.0–10.5)
nRBC: 0 % (ref 0.0–0.2)

## 2019-12-26 LAB — CBC
HCT: 39.2 % (ref 36.0–46.0)
Hemoglobin: 12.3 g/dL (ref 12.0–15.0)
MCH: 28.5 pg (ref 26.0–34.0)
MCHC: 31.4 g/dL (ref 30.0–36.0)
MCV: 90.7 fL (ref 80.0–100.0)
Platelets: 195 10*3/uL (ref 150–400)
RBC: 4.32 MIL/uL (ref 3.87–5.11)
RDW: 12.6 % (ref 11.5–15.5)
WBC: 5.4 10*3/uL (ref 4.0–10.5)
nRBC: 0 % (ref 0.0–0.2)

## 2019-12-26 LAB — COMPREHENSIVE METABOLIC PANEL
ALT: 26 U/L (ref 0–44)
AST: 38 U/L (ref 15–41)
Albumin: 3 g/dL — ABNORMAL LOW (ref 3.5–5.0)
Alkaline Phosphatase: 68 U/L (ref 38–126)
Anion gap: 10 (ref 5–15)
BUN: 7 mg/dL (ref 6–20)
CO2: 27 mmol/L (ref 22–32)
Calcium: 8.5 mg/dL — ABNORMAL LOW (ref 8.9–10.3)
Chloride: 103 mmol/L (ref 98–111)
Creatinine, Ser: 0.78 mg/dL (ref 0.44–1.00)
GFR calc Af Amer: >60 mL/min (ref >60)
GFR calc non Af Amer: >60 mL/min (ref >60)
Glucose, Bld: 98 mg/dL (ref 70–99)
Potassium: 3.6 mmol/L (ref 3.5–5.1)
Sodium: 140 mmol/L (ref 135–145)
Total Bilirubin: 0.9 mg/dL (ref 0.3–1.2)
Total Protein: 7.2 g/dL (ref 6.5–8.1)

## 2019-12-26 LAB — C-REACTIVE PROTEIN: CRP: 27.5 mg/dL — ABNORMAL HIGH (ref <1.0)

## 2019-12-26 LAB — HIV ANTIBODY (ROUTINE TESTING W REFLEX): HIV Screen 4th Generation wRfx: NONREACTIVE

## 2019-12-26 LAB — CREATININE, SERUM
Creatinine, Ser: 0.76 mg/dL (ref 0.44–1.00)
GFR calc Af Amer: >60 mL/min (ref >60)
GFR calc non Af Amer: >60 mL/min (ref >60)

## 2019-12-26 LAB — FIBRINOGEN: Fibrinogen: 622 mg/dL — ABNORMAL HIGH (ref 210–475)

## 2019-12-26 LAB — D-DIMER, QUANTITATIVE: D-Dimer, Quant: 1.24 ug/mL-FEU — ABNORMAL HIGH (ref 0.00–0.50)

## 2019-12-26 LAB — LACTIC ACID, PLASMA
Lactic Acid, Venous: 1.1 mmol/L (ref 0.5–1.9)
Lactic Acid, Venous: 0.9 mmol/L (ref 0.5–1.9)

## 2019-12-26 LAB — I-STAT BETA HCG BLOOD, ED (MC, WL, AP ONLY): I-stat hCG, quantitative: <5 m[IU]/mL (ref <5)

## 2019-12-26 LAB — TRIGLYCERIDES: Triglycerides: 169 mg/dL — ABNORMAL HIGH (ref <150)

## 2019-12-26 LAB — ABO/RH: ABO/RH(D): A NEG

## 2019-12-26 LAB — FERRITIN: Ferritin: 84 ng/mL (ref 11–307)

## 2019-12-26 LAB — LACTATE DEHYDROGENASE: LDH: 322 U/L — ABNORMAL HIGH (ref 98–192)

## 2019-12-26 LAB — PROCALCITONIN: Procalcitonin: 0.41 ng/mL

## 2019-12-26 MED ORDER — ASCORBIC ACID 500 MG PO TABS
500.0000 mg | ORAL_TABLET | Freq: Every day | ORAL | Status: DC
  Administered 2019-12-26 – 2019-12-30 (×5): 500 mg via ORAL
  Filled 2019-12-26 (×5): qty 1

## 2019-12-26 MED ORDER — SODIUM CHLORIDE 0.9 % IV SOLN
100.0000 mg | Freq: Every day | INTRAVENOUS | Status: AC
  Administered 2019-12-27 – 2019-12-30 (×4): 100 mg via INTRAVENOUS
  Filled 2019-12-26 (×4): qty 20

## 2019-12-26 MED ORDER — SODIUM CHLORIDE 0.9 % IV SOLN: INTRAVENOUS | Status: DC

## 2019-12-26 MED ORDER — ENOXAPARIN SODIUM 40 MG/0.4ML ~~LOC~~ SOLN
40.0000 mg | Freq: Two times a day (BID) | SUBCUTANEOUS | Status: DC
  Administered 2019-12-26 – 2019-12-27 (×2): 40 mg via SUBCUTANEOUS
  Filled 2019-12-26 (×2): qty 0.4

## 2019-12-26 MED ORDER — IRBESARTAN 75 MG PO TABS: 75.0000 mg | ORAL_TABLET | Freq: Every day | ORAL | Status: DC

## 2019-12-26 MED ORDER — THYROID 30 MG PO TABS: 30.0000 mg | ORAL_TABLET | Freq: Every day | ORAL | Status: DC

## 2019-12-26 MED ORDER — KETOROLAC TROMETHAMINE 30 MG/ML IJ SOLN
15.0000 mg | Freq: Once | INTRAMUSCULAR | Status: AC
  Administered 2019-12-26: 15 mg via INTRAVENOUS
  Filled 2019-12-26: qty 1

## 2019-12-26 MED ORDER — SODIUM CHLORIDE 0.9 % IV SOLN
200.0000 mg | Freq: Once | INTRAVENOUS | Status: AC
  Administered 2019-12-26: 200 mg via INTRAVENOUS
  Filled 2019-12-26: qty 40

## 2019-12-26 MED ORDER — SODIUM CHLORIDE 0.9% FLUSH
3.0000 mL | Freq: Two times a day (BID) | INTRAVENOUS | Status: DC
  Administered 2019-12-27 – 2019-12-29 (×3): 3 mL via INTRAVENOUS

## 2019-12-26 MED ORDER — GUAIFENESIN-DM 100-10 MG/5ML PO SYRP
10.0000 mL | ORAL_SOLUTION | ORAL | Status: DC | PRN
  Administered 2019-12-26 – 2019-12-30 (×8): 10 mL via ORAL
  Filled 2019-12-26 (×8): qty 10

## 2019-12-26 MED ORDER — DEXAMETHASONE SODIUM PHOSPHATE 10 MG/ML IJ SOLN
6.0000 mg | INTRAMUSCULAR | Status: DC
  Administered 2019-12-26 – 2019-12-29 (×4): 6 mg via INTRAVENOUS
  Filled 2019-12-26 (×4): qty 1

## 2019-12-26 MED ORDER — ONDANSETRON HCL 4 MG/2ML IJ SOLN
4.0000 mg | Freq: Once | INTRAMUSCULAR | Status: DC
  Filled 2019-12-26: qty 2

## 2019-12-26 MED ORDER — SODIUM CHLORIDE 0.9 % IV BOLUS
1000.0000 mL | Freq: Once | INTRAVENOUS | Status: AC
  Administered 2019-12-26: 1000 mL via INTRAVENOUS

## 2019-12-26 MED ORDER — HYDROCOD POLST-CPM POLST ER 10-8 MG/5ML PO SUER
5.0000 mL | Freq: Once | ORAL | Status: AC
  Administered 2019-12-26: 5 mL via ORAL
  Filled 2019-12-26: qty 5

## 2019-12-26 MED ORDER — ZINC SULFATE 220 (50 ZN) MG PO CAPS
220.0000 mg | ORAL_CAPSULE | Freq: Every day | ORAL | Status: DC
  Administered 2019-12-26 – 2019-12-30 (×5): 220 mg via ORAL
  Filled 2019-12-26 (×5): qty 1

## 2019-12-26 MED ORDER — CITALOPRAM HYDROBROMIDE 40 MG PO TABS
40.0000 mg | ORAL_TABLET | Freq: Every day | ORAL | Status: DC
  Administered 2019-12-27 – 2019-12-30 (×4): 40 mg via ORAL
  Filled 2019-12-26 (×4): qty 1

## 2019-12-26 MED ORDER — MELATONIN 5 MG PO TABS
10.0000 mg | ORAL_TABLET | Freq: Every day | ORAL | Status: DC
  Administered 2019-12-26: 10 mg via ORAL
  Filled 2019-12-26: qty 2

## 2019-12-26 MED ORDER — ALPRAZOLAM ER 0.5 MG PO TB24: 0.5000 mg | ORAL_TABLET | Freq: Two times a day (BID) | ORAL | Status: DC | PRN

## 2019-12-26 MED ORDER — BISOPROLOL FUMARATE 10 MG PO TABS
10.0000 mg | ORAL_TABLET | Freq: Every day | ORAL | Status: DC
  Administered 2019-12-27 – 2019-12-30 (×4): 10 mg via ORAL
  Filled 2019-12-26 (×5): qty 1

## 2019-12-26 MED ORDER — ALBUTEROL SULFATE HFA 108 (90 BASE) MCG/ACT IN AERS
2.0000 | INHALATION_SPRAY | Freq: Four times a day (QID) | RESPIRATORY_TRACT | Status: DC | PRN
  Filled 2019-12-26: qty 6.7

## 2019-12-26 MED ORDER — ACETAMINOPHEN 325 MG PO TABS
650.0000 mg | ORAL_TABLET | Freq: Four times a day (QID) | ORAL | Status: DC | PRN
  Administered 2019-12-27 – 2019-12-29 (×4): 650 mg via ORAL
  Filled 2019-12-26 (×4): qty 2

## 2019-12-26 MED ORDER — ROPINIROLE HCL 1 MG PO TABS
2.0000 mg | ORAL_TABLET | Freq: Every day | ORAL | Status: DC
  Administered 2019-12-26 – 2019-12-29 (×4): 2 mg via ORAL
  Filled 2019-12-26 (×4): qty 2

## 2019-12-26 MED ORDER — METOCLOPRAMIDE HCL 5 MG/ML IJ SOLN
10.0000 mg | Freq: Once | INTRAMUSCULAR | Status: AC
  Administered 2019-12-26: 18:00:00 10 mg via INTRAVENOUS
  Filled 2019-12-26: qty 2

## 2019-12-26 MED ORDER — SENNOSIDES-DOCUSATE SODIUM 8.6-50 MG PO TABS: 1.0000 | ORAL_TABLET | Freq: Every evening | ORAL | Status: DC | PRN

## 2019-12-26 MED ORDER — ONDANSETRON HCL 4 MG/2ML IJ SOLN
4.0000 mg | Freq: Four times a day (QID) | INTRAMUSCULAR | Status: DC | PRN
  Filled 2019-12-26: qty 2

## 2019-12-26 MED ORDER — AMLODIPINE BESYLATE 2.5 MG PO TABS
2.5000 mg | ORAL_TABLET | Freq: Every day | ORAL | Status: DC
  Administered 2019-12-27 – 2019-12-30 (×4): 2.5 mg via ORAL
  Filled 2019-12-26 (×4): qty 1

## 2019-12-26 MED ORDER — ACETAMINOPHEN 500 MG PO TABS
1000.0000 mg | ORAL_TABLET | Freq: Once | ORAL | Status: AC
  Administered 2019-12-26: 1000 mg via ORAL
  Filled 2019-12-26: qty 2

## 2019-12-26 NOTE — ED Notes (Signed)
Pt took her own Belbuca for chronic pain

## 2019-12-26 NOTE — ED Provider Notes (Signed)
[facility] EMERGENCY DEPARTMENT Provider Note   CSN: [id number] Arrival date & time: 12/26/19  1425     History Chief Complaint  Patient presents with  . Shortness of Breath    [name] is a 49 y.o. female.  49 yo F with a cc of cough, sob fever. Going on for about two weeks.  Started monoclonal antibodies yesterday.  Had worsening over the past 24 hours.  Feeling much more fatigued.  Now having some nvd.  Not keeping anything down. Fatigued she denies abdominal pain denies chest pain.  The history is provided by the patient.  Shortness of Breath Severity:  Moderate Onset quality:  Gradual Duration:  2 weeks Timing:  Constant Progression:  Worsening Chronicity:  New Relieved by:  Nothing Worsened by:  Nothing Ineffective treatments:  None tried Associated symptoms: cough, fever and vomiting   Associated symptoms: no chest pain, no headaches and no wheezing        Past Medical History:  Diagnosis Date  . Anxiety   . Arrhythmia    heart races when pt in pain  . Asthma   . Clostridium difficile infection 2014  . [place]ell Patient virus infection   . Fibromyalgia   . GERD (gastroesophageal reflux disease)   . Hypertension   . Hypothyroidism   . Immune deficiency disorder ([place])   . Lyme disease   . Mold contact confirmed    Toxic Mold syndrome  . Mononucleosis 5-10  . Ovarian cyst    Serous Cystadenofibroma-Left ovary  . PONV (postoperative nausea and vomiting)   . Shortness of breath    on exertion  . Systolic murmur   . Thyroid disease     Patient Active Problem List   Diagnosis Date Noted  . Upper airway cough syndrome vs VCD vs asthma 11/12/2019  . Personal history of endometriosis 11/30/2016  . Morbid obesity ([place]) 07/03/2014  . DOE (dyspnea on exertion) 07/02/2014  . Clostridium difficile infection   . GASTROPARESIS 06/15/2009  . INSOMNIA 05/12/2009  . EPIGASTRIC PAIN 04/15/2009  . INFECTIOUS MONONUCLEOSIS 03/10/2009  .  TACHYCARDIA 03/10/2009  . ADENOMATOUS COLONIC POLYP 12/05/2007  . FUNGAL DERMATITIS 11/30/2006  . HYPERLIPIDEMIA 11/30/2006  . PANIC DISORDER 11/30/2006  . ASTHMA 11/30/2006  . GERD 11/30/2006  . IRRITABLE BOWEL SYNDROME 11/30/2006  . INTERSTITIAL CYSTITIS 11/30/2006  . MYOFASCIAL PAIN SYNDROME 11/30/2006  . CHRONIC FATIGUE SYNDROME 11/30/2006  . HEADACHE 11/30/2006    Past Surgical History:  Procedure Laterality Date  . BLADDER SURGERY    . CHOLECYSTECTOMY    . FECAL TRANSPLANT    . Groin cyst    . LAPAROSCOPY Right 06/12/2013   Procedure: LAPAROSCOPY OPERATIVE  fulgeration of endometriosis, right salpingooophorectomy;  Surgeon: Terrance Mass, MD;  Location: [facility];  Service: Gynecology;  Laterality: Right;  . Nose cyst    . OOPHORECTOMY Right 06/12/2013   Procedure: OOPHORECTOMY;POSS RIGHT SALPING OOPHORECTOMY;  Surgeon: Terrance Mass, MD;  Location: [facility];  Service: Gynecology;  Laterality: Right;  . PELVIC LAPAROSCOPY     DL Ovarian cystectomy-Left  . TONSILLECTOMY  2009     OB History    Gravida  0   Para      Term      Preterm      AB      Living        SAB      TAB      Ectopic      Multiple  Live Births              Family History  Problem Relation Age of Onset  . Hypertension Mother   . Diabetes Mother   . Pulmonary embolism Mother        x2  . Breast cancer Maternal Aunt        Age 81  . Pancreatic cancer Maternal Grandmother   . Heart disease Maternal Grandfather   . Breast cancer Paternal Grandmother        Age 46  . Asthma Paternal Grandmother     Social History   Tobacco Use  . Smoking status: Never Smoker  . Smokeless tobacco: Never Used  Substance Use Topics  . Alcohol use: No    Alcohol/week: 0.0 standard drinks  . Drug use: No    Home Medications Prior to Admission medications   Medication Sig Start Date End Date Taking? Authorizing Provider  albuterol (PROAIR HFA) 108 (90 Base) MCG/ACT inhaler Inhale 2  puffs into the lungs every 6 (six) hours as needed for wheezing or shortness of breath. 11/24/19   Tanda Rockers, MD  ALPRAZolam (XANAX XR) 0.5 MG 24 hr tablet Take 0.5 mg by mouth in the morning and at bedtime.    [provider]  amLODipine (NORVASC) 2.5 MG tablet Take 1 tablet (2.5 mg total) by mouth daily. NEED OV. 04/29/18   Croitoru, Mihai, MD  bisoprolol (ZEBETA) 10 MG tablet Take 10 mg by mouth daily.    [provider]  Buprenorphine HCl (BELBUCA) 450 MCG FILM Place 1 Film inside cheek in the morning and at bedtime.    [provider]  busPIRone (BUSPAR) 15 MG tablet 1/3 tablet twice daily for 1 week, then 2/3 twice daily for 1 week, then 1 twice daily 12/02/19   Cottle, Billey Co., MD  cetirizine (ZYRTEC) 10 MG tablet Take by mouth.    [provider]  Cholecalciferol (VITAMIN D PO) Take 1 tablet by mouth daily.     [provider]  citalopram (CELEXA) 40 MG tablet Take 40 mg by mouth daily.    [provider]  colesevelam (WELCHOL) 625 MG tablet Take 625 mg by mouth 2 (two) times daily with a meal.    [provider]  fluticasone (FLONASE) 50 MCG/ACT nasal spray Place 1 spray into both nostrils daily.    [provider]  HYOSCYAMINE PO Take 0.125 mg by mouth daily.     [provider]  Ibuprofen-Famotidine (DUEXIS) 800-26.6 MG TABS Take by mouth. 10/14/19 01/12/20  [provider]  Melatonin 10 MG TABS Take by mouth.    [provider]  methocarbamol (ROBAXIN) 500 MG tablet Take 1 tablet (500 mg total) by mouth 3 (three) times daily. 09/19/18   Pete Pelt, PA-C  norethindrone-ethinyl estradiol (JUNEL FE 1/20) 1-20 MG-MCG tablet Take 1 tablet by mouth daily. Continuous use for Dysmenorrhea 06/16/19   Princess Bruins, MD  predniSONE (DELTASONE) 10 MG tablet Take 4 tabs po daily x 2 days; then 3 tabs for 2 days; then 2 tabs for 2 days; then 1 tab for 2 days 12/24/19   Martyn Ehrich, NP    Probiotic Product (ALIGN PO) Take by mouth as needed.    [provider]  prochlorperazine (COMPAZINE) 10 MG tablet Take 10 mg by mouth every 6 (six) hours as needed.    [provider]  promethazine (PHENERGAN) 25 MG tablet Take 25 mg by mouth every 6 (six)  hours as needed.    [provider]  rOPINIRole (REQUIP) 1 MG tablet Take 1 mg by mouth 3 (three) times daily.    [provider]  telmisartan (MICARDIS) 80 MG tablet Take 80 mg by mouth daily.    [provider]  thyroid (ARMOUR) 30 MG tablet Take 30 mg by mouth daily before breakfast.    [provider]  vitamin B-12 (CYANOCOBALAMIN) 500 MCG tablet Take 500 mcg by mouth daily.    [provider]    Allergies    Allegra [fexofenadine hcl], Fexofenadine, Oseltamivir, Tamiflu [oseltamivir phosphate], Vancomycin, Diphenhydramine, Latex, Sulfamethoxazole, Aspirin, Doxycycline, Other, Diphenhydramine hcl, Morphine, Oseltamivir phosphate, Penicillins, and Sulfonamide derivatives  Review of Systems   Review of Systems  Constitutional: Positive for chills and fever.  HENT: Negative for congestion and rhinorrhea.   Eyes: Negative for redness and visual disturbance.  Respiratory: Positive for cough and shortness of breath. Negative for wheezing.   Cardiovascular: Negative for chest pain and palpitations.  Gastrointestinal: Positive for diarrhea, nausea and vomiting.  Genitourinary: Negative for dysuria and urgency.  Musculoskeletal: Negative for arthralgias and myalgias.  Skin: Negative for pallor and wound.  Neurological: Negative for dizziness and headaches.    Physical Exam Updated Vital Signs BP 136/75   Pulse 86   Temp 99.8 F (37.7 C) (Oral)   Resp 20   Ht 5\' 1"  (1.549 m)   Wt 90.3 kg   SpO2 90%   BMI 37.60 kg/m   Physical Exam Vitals and nursing note reviewed.  Constitutional:      General: She is not in acute distress.    Appearance: She is well-developed.  She is not diaphoretic.  HENT:     Head: Normocephalic and atraumatic.  Eyes:     Pupils: Pupils are equal, round, and reactive to light.  Cardiovascular:     Rate and Rhythm: Normal rate and regular rhythm.     Heart sounds: No murmur. No friction rub. No gallop.   Pulmonary:     Effort: Pulmonary effort is normal.     Breath sounds: No wheezing or rales.  Abdominal:     General: There is no distension.     Palpations: Abdomen is soft.     Tenderness: There is no abdominal tenderness.  Musculoskeletal:        General: No tenderness.     Cervical back: Normal range of motion and neck supple.  Skin:    General: Skin is warm and dry.  Neurological:     Mental Status: She is alert and oriented to person, place, and time.  Psychiatric:        Behavior: Behavior normal.     ED Results / Procedures / Treatments   Labs (all labs ordered are listed, but only abnormal results are displayed) Labs Reviewed  COMPREHENSIVE METABOLIC PANEL - Abnormal; Notable for the following components:      Result Value   Calcium 8.5 (*)    Albumin 3.0 (*)    All other components within normal limits  D-DIMER, QUANTITATIVE (NOT AT [facility]) - Abnormal; Notable for the following components:   D-Dimer, Quant 1.24 (*)    All other components within normal limits  FIBRINOGEN - Abnormal; Notable for the following components:   Fibrinogen 622 (*)    All other components within normal limits  C-REACTIVE PROTEIN - Abnormal; Notable for the following components:   CRP 27.5 (*)    All other components within normal limits  LACTATE DEHYDROGENASE -  Abnormal; Notable for the following components:   LDH 322 (*)    All other components within normal limits  TRIGLYCERIDES - Abnormal; Notable for the following components:   Triglycerides 169 (*)    All other components within normal limits  CULTURE, BLOOD (ROUTINE X 2)  CULTURE, BLOOD (ROUTINE X 2)  CBC WITH DIFFERENTIAL/PLATELET  PROCALCITONIN  FERRITIN    LACTIC ACID, PLASMA  LACTIC ACID, PLASMA  I-STAT BETA HCG BLOOD, ED ([facility], [facility], [facility] ONLY)    EKG None  Radiology DG Chest Portable 1 View  Result Date: 12/26/2019 CLINICAL DATA:  Shortness of breath.  Recent COVID-19 diagnosis. EXAM: PORTABLE CHEST 1 VIEW COMPARISON:  November 11, 2019 FINDINGS: Bilateral patchy peripheral pulmonary infiltrates. The heart size remains borderline but poorly assessed on a portable film. Mild interstitial prominence. No other acute abnormalities. IMPRESSION: Bilateral peripheral pulmonary infiltrates, left greater than right, consistent with COVID-19 pneumonia given history. Electronically Signed   By: Dorise Bullion III M.D   On: 12/26/2019 15:29    Procedures Procedures (including critical care time)  Medications Ordered in ED Medications  sodium chloride 0.9 % bolus 1,000 mL (has no administration in time range)  ondansetron (ZOFRAN) injection 4 mg (has no administration in time range)  acetaminophen (TYLENOL) tablet 1,000 mg (has no administration in time range)  ketorolac (TORADOL) 30 MG/ML injection 15 mg (has no administration in time range)  chlorpheniramine-HYDROcodone (TUSSIONEX) 10-8 MG/5ML suspension 5 mL (has no administration in time range)    ED Course  I have reviewed the triage vital signs and the nursing notes.  Pertinent labs & imaging results that were available during my care of the patient were reviewed by me and considered in my medical decision making (see chart for details).    MDM Rules/Calculators/A&P                      49 yo F with a chief complaints of having the novel coronavirus.  Patient has been having cough congestion fevers chills nausea and vomiting.  Got her first dose of the monoclonal antibodies yesterday but unfortunately has had worsening.  No longer able to tolerate by mouth.  Having profuse diarrhea.  Also having worsening shortness of breath.  Found to be 85% on room air placed on oxygen with improvement.  Will  discuss with hospitalist for admission.  CRITICAL CARE Performed by: Cecilio Asper   Total critical care time: 35 minutes  Critical care time was exclusive of separately billable procedures and treating other patients.  Critical care was necessary to treat or prevent imminent or life-threatening deterioration.  Critical care was time spent personally by me on the following activities: development of treatment plan with patient and/or surrogate as well as nursing, discussions with consultants, evaluation of patient's response to treatment, examination of patient, obtaining history from patient or surrogate, ordering and performing treatments and interventions, ordering and review of laboratory studies, ordering and review of radiographic studies, pulse oximetry and re-evaluation of patient's condition.  Final Clinical Impression(s) / ED Diagnoses Final diagnoses:  Acute respiratory failure with hypoxia ([place])  Pneumonia due to COVID-19 virus    Rx / DC Orders ED Discharge Orders    None       Deno Etienne, DO 12/26/19 1719

## 2019-12-26 NOTE — ED Triage Notes (Signed)
Patient stated she tested + Covid last Saturday and symptoms started the day before that. C/O shortness of breath; initial Sp02 on RA 82; Initiated supplemental 02 via [place] at 2.5 L and improved to 94 %

## 2019-12-26 NOTE — ED Notes (Signed)
Attempted report 

## 2019-12-26 NOTE — H&P (Addendum)
Date: 12/26/2019               Patient Name:  [name] MRN: [id number]  DOB: [birth year] Age / Sex: 49 y.o., female   PCP: Beth Pretty, MD         Medical Service: Internal Medicine Teaching Service         Attending Physician: Dr. Deno Etienne, DO    First Contact: Beth Coil, MD, [name] Pager: [name] [phone])  Second Contact: Beth Hawk, MD, [name] Pager: EM [phone])       After Hours (After 5p/  First Contact Pager: [phone]  weekends / holidays): Second Contact Pager: [phone]   Chief Complaint: SOB  History of Present Illness: [name] is a 49 year old female with a past medical history significant for chronic pain on Belbuca, fibromyalgia, anxiety, hypothyroidism, HTN, GERD, and obesity who presents with a 2-week history of gradually worsening SOB, cough and fevers. She states that she was diagnosed with COVID-19 on 4/17.  On 4/20 she presented to the [facility] ED with diarrhea.  She was discharged home after supportive management. The patient reached out to her PCP, then was referred to pulmonology for a telehealth visit. It was determined that she met the criteria for monoclonal antibody treatment and she received her first dose yesterday, but subsequently started experiencing worsening symptoms. She states that she is unable to tolerate anything p.o. and having nausea, vomiting and diarrhea. Upon presentation, she was found to be hypoxic at 85% on room air and was placed on supplemental O2.  ED course: In the ED, a CBC, CMP, procalcitonin, ferritin, fibrinogen were unremarkable.  Patient had elevated LDH and CRP to 322 and 27.5 respectively.  Blood cultures were also  drawn. CXR demonstrated bilateral peripheral pulmonary infiltrates, left greater than right.  Meds:  No outpatient medications have been marked as taking for the 12/26/19 encounter [facility] Encounter).   Allergies: Allergies as of 12/26/2019 - Review Complete 12/25/2019  Allergen Reaction  Noted  . Allegra [fexofenadine hcl] Shortness Of Breath and Rash 02/16/2012  . Fexofenadine Shortness Of Breath, Rash, and Other (See Comments) 02/08/2012  . Oseltamivir Other (See Comments) 02/08/2012  . Tamiflu [oseltamivir phosphate] Itching, Nausea And Vomiting, and Other (See Comments) 04/27/2015  . Vancomycin Other (See Comments)   . Diphenhydramine Other (See Comments) 04/27/2015  . Latex Itching, Dermatitis, and Rash 09/19/2017  . Sulfamethoxazole Rash 04/27/2015  . Aspirin Nausea And Vomiting 12/26/2019  . Doxycycline    . Other  02/16/2012  . Diphenhydramine hcl Palpitations and Rash   . Morphine Other (See Comments)   . Oseltamivir phosphate Itching and Nausea And Vomiting   . Penicillins Itching and Swelling   . Sulfonamide derivatives Rash    Past Medical History:  Diagnosis Date  . Anxiety   . Arrhythmia    heart races when pt in pain  . Asthma   . Clostridium difficile infection 2014  . Randell Patient virus infection   . Fibromyalgia   . GERD (gastroesophageal reflux disease)   . Hypertension   . Hypothyroidism   . Immune deficiency disorder ([place])   . Lyme disease   . Mold contact confirmed    Toxic Mold syndrome  . Mononucleosis 5-10  . Ovarian cyst    Serous Cystadenofibroma-Left ovary  . PONV (postoperative nausea and vomiting)   . Shortness of breath    on exertion  . Systolic murmur   . Thyroid disease    Past Surgical History:  Procedure Laterality Date  . BLADDER SURGERY    . CHOLECYSTECTOMY    . FECAL TRANSPLANT    . Groin cyst    . LAPAROSCOPY Right 06/12/2013   Procedure: LAPAROSCOPY OPERATIVE  fulgeration of endometriosis, right salpingooophorectomy;  Surgeon: Terrance Mass, MD;  Location: [facility];  Service: Gynecology;  Laterality: Right;  . Nose cyst    . OOPHORECTOMY Right 06/12/2013   Procedure: OOPHORECTOMY;POSS RIGHT SALPING OOPHORECTOMY;  Surgeon: Terrance Mass, MD;  Location: [facility];  Service: Gynecology;  Laterality: Right;  .  PELVIC LAPAROSCOPY     DL Ovarian cystectomy-Left  . TONSILLECTOMY  2009   Family History:  Family History  Problem Relation Age of Onset  . Hypertension Mother   . Diabetes Mother   . Pulmonary embolism Mother        x2  . Breast cancer Maternal Aunt        Age 26  . Pancreatic cancer Maternal Grandmother   . Heart disease Maternal Grandfather   . Breast cancer Paternal Grandmother        Age 52  . Asthma Paternal Grandmother     Social History:  Social History   Tobacco Use  . Smoking status: Never Smoker  . Smokeless tobacco: Never Used  Substance Use Topics  . Alcohol use: No    Alcohol/week: 0.0 standard drinks  . Drug use: No  -Patient lives at home and her parents live upstairs.  She states her siblings, nieces and nephews all have Covid. -Works as a Careers information officer  Review of Systems: A complete ROS was negative except as per HPI.   Imaging: EKG: personally reviewed my interpretation is sinus rhythm with no signs of ischemia.  CXR:  IMPRESSION: Bilateral peripheral pulmonary infiltrates, left greater than right, consistent with COVID-19 pneumonia given history.  Physical Exam: Blood pressure 136/75, pulse 86, temperature 99.8 F (37.7 C), temperature source Oral, resp. rate 20, height _0  (1.549 m), weight 90.3 kg, SpO2 90 %.  Physical Exam Vitals reviewed.  Constitutional:      General: She is not in acute distress.    Appearance: She is obese. She is ill-appearing. She is not toxic-appearing or diaphoretic.  HENT:     Head: Normocephalic and atraumatic.  Cardiovascular:     Rate and Rhythm: Normal rate and regular rhythm.     Pulses: Normal pulses.     Heart sounds: Normal heart sounds. No murmur. No friction rub. No gallop.   Pulmonary:     Effort: Pulmonary effort is normal. No tachypnea or accessory muscle usage.     Breath sounds: No decreased breath sounds, wheezing, rhonchi or rales.  Abdominal:     General: Bowel sounds are  normal.     Palpations: Abdomen is soft. There is no mass.     Tenderness: There is no abdominal tenderness. There is no guarding or rebound.  Musculoskeletal:     Right lower leg: No tenderness. No edema.     Left lower leg: No tenderness. No edema.  Skin:    General: Skin is warm.  Neurological:     General: No focal deficit present.     Mental Status: She is alert and oriented to person, place, and time.  Psychiatric:        Mood and Affect: Mood normal.        Behavior: Behavior normal.    Assessment & Plan by Problem: Active Problems:   Pneumonia due to COVID-19 virus  In summary, [name] is a 49 y.o F with a hx of chronic pain on Belbuca, fibromyalgia, anxiety, hypothyroidism, HTN, GERD, and obesity who presents today with a 2 week hx of increased SOB, coughs and fevers (Tmax 102) in the setting of known COVID-19 diagnosis.  Patient received 1 dose of monoclonal antibodies yesterday, and subsequently developed nausea, vomiting and diarrhea.    #COVID-19 Pneumonia #Hypoxic Respiratory Failure: Patient was diagnosed with SARS-CoV-2 on 4/17 and her SOB, fevers, and coughing worsened.  She received a dose of monoclonal antibodies yesterday and then subsequently developed nausea, vomiting and diarrhea. -Remdesivir 200 mg today followed by 100 mg for 4 days -Dexamethasone 6 mg daily -Robitussin 10 mL every 4 hours as needed for coughing -Tylenol 650 mg every 6 hours as needed -Airborne precautions -Telemetry -Daily inflammatory lab trending  #Nausea #Vomiting #Diarrhea: Initially, was concern for the patient's volume status given the history of nausea, vomiting and diarrhea.  However on exam, the patient appears euvolemic and her vital signs are within normal limits. S/p 1L IVF in the ED. 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  Low suspicion for active C.  difficile infection. -NS 100 cc/hr for 10 hours  #HTN -Amlodipine 2.5 mg daily -Bisoprolol 10 mg daily  #Anxiety -Xanax 0.5 mg twice daily as needed -Citalopram 40 mg daily  #FEN/GI -Diet: Heart healthy -Fluids: NS 100 cc/hr for 10 hrs -Zinc capsule 220 mg daily -Vit C 500 mg daily  #DVT prophylaxis -Lovenox 40 mg subq injections daily  #CODE STATUS: FULL  #Dispo: Admit patient to Observation with expected length of stay less than 2 midnights. Prior to Admission Living Arrangement: Home Anticipated Discharge Location: Home Barriers to Discharge: Pending medical workup   Signed: Earlene Plater, MD Internal Medicine, PGY1 Pager: [phone]  12/26/2019,4:51 PM

## 2019-12-26 NOTE — Hospital Course (Addendum)
Admitted 12/26/2019  Allergies: Allegra [fexofenadine hcl], Fexofenadine, Oseltamivir, Tamiflu [oseltamivir phosphate], Vancomycin, Diphenhydramine, Latex, Sulfamethoxazole, Aspirin, Doxycycline, Other, Diphenhydramine hcl, Morphine, Oseltamivir phosphate, Penicillins, and Sulfonamide derivatives Pertinent Hx: Obesity, HLD, asthma, GERD, IBS, chronic fatigue syndrome, COVID-19 diagnosed 4/17  49 y.o. female p/w worsening of shortness of breath, diarrhea after being diagnosed with COVID-19  * COVID-19 PNA: cough, N/V, diarrhea, cough since a week ago. She was tested positive for Covid on Saturday, 12/20/2019. S/p one dose of IV Decadron in med center in [facility] (did not pick up p.o. prednisone taper after that) He received monoclonal Ab yesterday. But her symptoms did not improve and she became more short of breath and came to [facility] ED. On arrival, O2 sat 85% on RA. Improved with 2.5 cc [place].  Chest x-ray with bilateral L>R peripheral pulmonary infiltrates consistent with Covid pneumonia. Inflammatory markers elevated. Remdesivir, Decadron, Robiyussin   HTN: on Amlodipne and telmisartan and bisoprolol at home. now normotensive. Resume amlodipine and BB tomorrow.  Consults: Meds:  Albuterol, amlodipine, prn Xanax, bisoprolol, buspirone, citalopram, Robitussin, remdesivir, decadrone, albuterol [place] Thyroid VTE ppx: Lovenox IVF: NS  1 li then 11ml/h Diet: [place]

## 2019-12-27 DIAGNOSIS — U071 COVID-19: Principal | ICD-10-CM

## 2019-12-27 DIAGNOSIS — Z23 Encounter for immunization: Secondary | ICD-10-CM | POA: Diagnosis not present

## 2019-12-27 DIAGNOSIS — J1282 Pneumonia due to coronavirus disease 2019: Secondary | ICD-10-CM | POA: Diagnosis present

## 2019-12-27 DIAGNOSIS — F419 Anxiety disorder, unspecified: Secondary | ICD-10-CM | POA: Diagnosis present

## 2019-12-27 DIAGNOSIS — Z888 Allergy status to other drugs, medicaments and biological substances status: Secondary | ICD-10-CM | POA: Diagnosis not present

## 2019-12-27 DIAGNOSIS — J9601 Acute respiratory failure with hypoxia: Secondary | ICD-10-CM | POA: Diagnosis present

## 2019-12-27 DIAGNOSIS — K219 Gastro-esophageal reflux disease without esophagitis: Secondary | ICD-10-CM | POA: Diagnosis present

## 2019-12-27 DIAGNOSIS — Z880 Allergy status to penicillin: Secondary | ICD-10-CM | POA: Diagnosis not present

## 2019-12-27 DIAGNOSIS — Z90721 Acquired absence of ovaries, unilateral: Secondary | ICD-10-CM | POA: Diagnosis not present

## 2019-12-27 DIAGNOSIS — G8929 Other chronic pain: Secondary | ICD-10-CM | POA: Diagnosis present

## 2019-12-27 DIAGNOSIS — Z881 Allergy status to other antibiotic agents status: Secondary | ICD-10-CM | POA: Diagnosis not present

## 2019-12-27 DIAGNOSIS — Z9049 Acquired absence of other specified parts of digestive tract: Secondary | ICD-10-CM | POA: Diagnosis not present

## 2019-12-27 DIAGNOSIS — I10 Essential (primary) hypertension: Secondary | ICD-10-CM | POA: Diagnosis present

## 2019-12-27 DIAGNOSIS — Z803 Family history of malignant neoplasm of breast: Secondary | ICD-10-CM | POA: Diagnosis not present

## 2019-12-27 DIAGNOSIS — M797 Fibromyalgia: Secondary | ICD-10-CM | POA: Diagnosis present

## 2019-12-27 DIAGNOSIS — Z91040 Latex allergy status: Secondary | ICD-10-CM | POA: Diagnosis not present

## 2019-12-27 DIAGNOSIS — Z833 Family history of diabetes mellitus: Secondary | ICD-10-CM | POA: Diagnosis not present

## 2019-12-27 DIAGNOSIS — Z882 Allergy status to sulfonamides status: Secondary | ICD-10-CM | POA: Diagnosis not present

## 2019-12-27 DIAGNOSIS — Z6837 Body mass index (BMI) 37.0-37.9, adult: Secondary | ICD-10-CM | POA: Diagnosis not present

## 2019-12-27 DIAGNOSIS — E039 Hypothyroidism, unspecified: Secondary | ICD-10-CM | POA: Diagnosis present

## 2019-12-27 DIAGNOSIS — Z91018 Allergy to other foods: Secondary | ICD-10-CM | POA: Diagnosis not present

## 2019-12-27 DIAGNOSIS — Z8249 Family history of ischemic heart disease and other diseases of the circulatory system: Secondary | ICD-10-CM | POA: Diagnosis not present

## 2019-12-27 DIAGNOSIS — A0839 Other viral enteritis: Secondary | ICD-10-CM | POA: Diagnosis present

## 2019-12-27 LAB — COMPREHENSIVE METABOLIC PANEL
ALT: 21 U/L (ref 0–44)
AST: 27 U/L (ref 15–41)
Albumin: 2.6 g/dL — ABNORMAL LOW (ref 3.5–5.0)
Alkaline Phosphatase: 57 U/L (ref 38–126)
Anion gap: 10 (ref 5–15)
BUN: 7 mg/dL (ref 6–20)
CO2: 25 mmol/L (ref 22–32)
Calcium: 7.9 mg/dL — ABNORMAL LOW (ref 8.9–10.3)
Chloride: 106 mmol/L (ref 98–111)
Creatinine, Ser: 0.66 mg/dL (ref 0.44–1.00)
GFR calc Af Amer: >60 mL/min (ref >60)
GFR calc non Af Amer: >60 mL/min (ref >60)
Glucose, Bld: 144 mg/dL — ABNORMAL HIGH (ref 70–99)
Potassium: 4.1 mmol/L (ref 3.5–5.1)
Sodium: 141 mmol/L (ref 135–145)
Total Bilirubin: 0.8 mg/dL (ref 0.3–1.2)
Total Protein: 6.3 g/dL — ABNORMAL LOW (ref 6.5–8.1)

## 2019-12-27 LAB — CBC WITH DIFFERENTIAL/PLATELET
Abs Immature Granulocytes: 0.01 10*3/uL (ref 0.00–0.07)
Basophils Absolute: 0 10*3/uL (ref 0.0–0.1)
Basophils Relative: 0 %
Eosinophils Absolute: 0 10*3/uL (ref 0.0–0.5)
Eosinophils Relative: 0 %
HCT: 37.3 % (ref 36.0–46.0)
Hemoglobin: 11.8 g/dL — ABNORMAL LOW (ref 12.0–15.0)
Immature Granulocytes: 0 %
Lymphocytes Relative: 24 %
Lymphs Abs: 0.8 10*3/uL (ref 0.7–4.0)
MCH: 28.6 pg (ref 26.0–34.0)
MCHC: 31.6 g/dL (ref 30.0–36.0)
MCV: 90.3 fL (ref 80.0–100.0)
Monocytes Absolute: 0.1 10*3/uL (ref 0.1–1.0)
Monocytes Relative: 3 %
Neutro Abs: 2.4 10*3/uL (ref 1.7–7.7)
Neutrophils Relative %: 73 %
Platelets: 187 10*3/uL (ref 150–400)
RBC: 4.13 MIL/uL (ref 3.87–5.11)
RDW: 12.6 % (ref 11.5–15.5)
WBC: 3.2 10*3/uL — ABNORMAL LOW (ref 4.0–10.5)
nRBC: 0 % (ref 0.0–0.2)

## 2019-12-27 LAB — D-DIMER, QUANTITATIVE: D-Dimer, Quant: 0.64 ug/mL-FEU — ABNORMAL HIGH (ref 0.00–0.50)

## 2019-12-27 LAB — PHOSPHORUS: Phosphorus: 3.3 mg/dL (ref 2.5–4.6)

## 2019-12-27 LAB — C-REACTIVE PROTEIN: CRP: 23.1 mg/dL — ABNORMAL HIGH (ref <1.0)

## 2019-12-27 LAB — MAGNESIUM: Magnesium: 2.1 mg/dL (ref 1.7–2.4)

## 2019-12-27 LAB — FERRITIN: Ferritin: 83 ng/mL (ref 11–307)

## 2019-12-27 MED ORDER — METOCLOPRAMIDE HCL 5 MG/ML IJ SOLN
10.0000 mg | Freq: Four times a day (QID) | INTRAMUSCULAR | Status: DC | PRN
  Administered 2019-12-27 – 2019-12-29 (×2): 10 mg via INTRAVENOUS
  Filled 2019-12-27 (×2): qty 2

## 2019-12-27 MED ORDER — ALIGN PO CAPS: 1.0000 | ORAL_CAPSULE | Freq: Every day | ORAL | Status: DC

## 2019-12-27 MED ORDER — THYROID 60 MG PO TABS
90.0000 mg | ORAL_TABLET | Freq: Every day | ORAL | Status: DC
  Administered 2019-12-27 – 2019-12-30 (×4): 90 mg via ORAL
  Filled 2019-12-27 (×4): qty 1

## 2019-12-27 MED ORDER — SACCHAROMYCES BOULARDII 250 MG PO CAPS
250.0000 mg | ORAL_CAPSULE | Freq: Two times a day (BID) | ORAL | Status: DC
  Administered 2019-12-27 – 2019-12-30 (×7): 250 mg via ORAL
  Filled 2019-12-27 (×7): qty 1

## 2019-12-27 MED ORDER — BUPRENORPHINE HCL 450 MCG BU FILM: 1.0000 | ORAL_FILM | Freq: Every day | BUCCAL | Status: DC

## 2019-12-27 MED ORDER — ALPRAZOLAM 0.5 MG PO TABS
0.5000 mg | ORAL_TABLET | Freq: Two times a day (BID) | ORAL | Status: DC | PRN
  Administered 2019-12-27 – 2019-12-30 (×4): 0.5 mg via ORAL
  Filled 2019-12-27 (×4): qty 1

## 2019-12-27 MED ORDER — ORAL CARE MOUTH RINSE
15.0000 mL | Freq: Two times a day (BID) | OROMUCOSAL | Status: DC
  Administered 2019-12-27 – 2019-12-29 (×4): 15 mL via OROMUCOSAL

## 2019-12-27 MED ORDER — ENOXAPARIN SODIUM 60 MG/0.6ML ~~LOC~~ SOLN
45.0000 mg | SUBCUTANEOUS | Status: DC
  Administered 2019-12-28 – 2019-12-30 (×3): 45 mg via SUBCUTANEOUS
  Filled 2019-12-27 (×3): qty 0.6

## 2019-12-27 MED ORDER — METOCLOPRAMIDE HCL 5 MG/ML IJ SOLN
10.0000 mg | Freq: Three times a day (TID) | INTRAMUSCULAR | Status: DC | PRN
  Administered 2019-12-27: 10 mg via INTRAVENOUS
  Filled 2019-12-27: qty 2

## 2019-12-27 MED ORDER — MELATONIN 5 MG PO TABS
5.0000 mg | ORAL_TABLET | Freq: Every day | ORAL | Status: DC
  Administered 2019-12-27 – 2019-12-29 (×3): 5 mg via ORAL
  Filled 2019-12-27 (×4): qty 1

## 2019-12-27 MED ORDER — SODIUM CHLORIDE 0.9 % IV SOLN: INTRAVENOUS | Status: DC

## 2019-12-27 NOTE — Progress Notes (Addendum)
Subjective:   Pt was seen at the bedside today.  The patient states that her primary complaint is nausea and diarrhea.  She states that she had several loose and runny bowel movements overnight after she was admitted.  The Reglan has been very helpful for her nausea.  As for her breathing, she feels like she is breathing comfortably. She is currently on 3 L supplemental O2.  All concerns were addressed.  Objective: CBC Latest Ref Rng & Units 12/27/2019 12/26/2019 12/26/2019  WBC 4.0 - 10.5 K/uL 3.2(L) 5.4 7.1  Hemoglobin 12.0 - 15.0 g/dL 11.8(L) 12.3 12.8  Hematocrit 36.0 - 46.0 % 37.3 39.2 40.9  Platelets 150 - 400 K/uL 187 195 202   BMP Latest Ref Rng & Units 12/27/2019 12/26/2019 12/26/2019  Glucose 70 - 99 mg/dL 144(H) - 98  BUN 6 - 20 mg/dL 7 - 7  Creatinine 0.44 - 1.00 mg/dL 0.66 0.76 0.78  Sodium 135 - 145 mmol/L 141 - 140  Potassium 3.5 - 5.1 mmol/L 4.1 - 3.6  Chloride 98 - 111 mmol/L 106 - 103  CO2 22 - 32 mmol/L 25 - 27  Calcium 8.9 - 10.3 mg/dL 7.9(L) - 8.5(L)   Vital signs in last 24 hours: Vitals:   12/26/19 2000 12/26/19 2100 12/27/19 0034 12/27/19 0404  BP: 133/77 115/66 133/77 (!) 117/54  Pulse: 73 65 72 66  Resp: 16 17 20 19   Temp: 99.1 F (37.3 C) 99 F (37.2 C) 98.1 F (36.7 C) 98.5 F (36.9 C)  TempSrc:  Oral Oral Oral  SpO2: 98% 97% 95% 94%  Weight:    90.5 kg  Height:    5\' 1"  (1.549 m)   Physical Exam General: Laying in bed, NAD HEENT: NCAT, [place] in place CV: RRR, normal S1-S2 no murmurs rubs or gallops appreciated PULM: Clear to auscultation bilaterally, no crackles, rales or wheezing appreciated in all lung fields ABD: Soft and nontender in all quadrants NEURO: Alert and oriented, nonfocal  Assessment/Plan:  Active Problems:   Pneumonia due to COVID-19 virus  In summary, [name] is a 49 y.o F with a hx of chronic pain on Belbuca, fibromyalgia, anxiety, hypothyroidism, HTN, GERD, and obesity admitted with COVID-19 pneumonia and  gastroenteritis.     #COVID-19 Pneumonia #Hypoxic Respiratory Failure: Patient was diagnosed with SARS-CoV-2 on 4/17 and her SOB, fevers, and coughing worsened. She received a dose of monoclonal antibodies the day before admission and then subsequently developed increased nausea, vomiting and diarrhea. Inflammatory labs are mildly elevated and trending downward. -Remdesivir 100 mg daily (day 2/5) -Dexamethasone 6 mg daily (day 2) -Robitussin 10 mL every 4 hours as needed for coughing -Tylenol 650 mg every 6 hours as needed -Airborne precautions -Telemetry -Daily inflammatory lab trending   #Nausea #Vomiting #Diarrhea: Patient continues to have nausea and frequent diarrhea. Patient does have a history of recurrent C. difficile infection with antibiotic use, but she currently does not have any abdominal pain or blood in her diarrhea.  Low suspicion for active C. difficile infection. -Reglan 10 mg q6hrs PRN   #HTN -Amlodipine 2.5 mg daily -Bisoprolol 10 mg daily   #Anxiety -Xanax 0.5 mg twice daily as needed -Citalopram 40 mg daily  #FEN/GI -Diet: Heart Healthy -Fluids: NS 75 cc/hr -Reglan 10 mg q6hrs PRN -Zinc capsule 220 mg daily -Vit C 500 mg daily  #DVT prophylaxis -Lovenox 40 mg subq injections daily  #CODE STATUS: FULL  #Dispo: Anticipated discharge in approximately 1-2 days  Prior to Admission Living  Arrangement: Home Anticipated Discharge Location: Home Barriers to Discharge: Medical workup  Beth Plater, MD Internal Medicine, PGY1 Pager: [phone]  12/27/2019,11:11 AM

## 2019-12-28 LAB — COMPREHENSIVE METABOLIC PANEL
ALT: 17 U/L (ref 0–44)
AST: 17 U/L (ref 15–41)
Albumin: 2.7 g/dL — ABNORMAL LOW (ref 3.5–5.0)
Alkaline Phosphatase: 52 U/L (ref 38–126)
Anion gap: 11 (ref 5–15)
BUN: 5 mg/dL — ABNORMAL LOW (ref 6–20)
CO2: 25 mmol/L (ref 22–32)
Calcium: 8.5 mg/dL — ABNORMAL LOW (ref 8.9–10.3)
Chloride: 105 mmol/L (ref 98–111)
Creatinine, Ser: 0.7 mg/dL (ref 0.44–1.00)
GFR calc Af Amer: >60 mL/min (ref >60)
GFR calc non Af Amer: >60 mL/min (ref >60)
Glucose, Bld: 130 mg/dL — ABNORMAL HIGH (ref 70–99)
Potassium: 4.1 mmol/L (ref 3.5–5.1)
Sodium: 141 mmol/L (ref 135–145)
Total Bilirubin: 0.7 mg/dL (ref 0.3–1.2)
Total Protein: 6.6 g/dL (ref 6.5–8.1)

## 2019-12-28 LAB — CBC WITH DIFFERENTIAL/PLATELET
Abs Immature Granulocytes: 0.02 10*3/uL (ref 0.00–0.07)
Basophils Absolute: 0 10*3/uL (ref 0.0–0.1)
Basophils Relative: 0 %
Eosinophils Absolute: 0 10*3/uL (ref 0.0–0.5)
Eosinophils Relative: 0 %
HCT: 38.1 % (ref 36.0–46.0)
Hemoglobin: 12.3 g/dL (ref 12.0–15.0)
Immature Granulocytes: 1 %
Lymphocytes Relative: 30 %
Lymphs Abs: 1.1 10*3/uL (ref 0.7–4.0)
MCH: 28.4 pg (ref 26.0–34.0)
MCHC: 32.3 g/dL (ref 30.0–36.0)
MCV: 88 fL (ref 80.0–100.0)
Monocytes Absolute: 0.2 10*3/uL (ref 0.1–1.0)
Monocytes Relative: 7 %
Neutro Abs: 2.3 10*3/uL (ref 1.7–7.7)
Neutrophils Relative %: 62 %
Platelets: 245 10*3/uL (ref 150–400)
RBC: 4.33 MIL/uL (ref 3.87–5.11)
RDW: 12.2 % (ref 11.5–15.5)
WBC: 3.6 10*3/uL — ABNORMAL LOW (ref 4.0–10.5)
nRBC: 0 % (ref 0.0–0.2)

## 2019-12-28 LAB — FERRITIN: Ferritin: 97 ng/mL (ref 11–307)

## 2019-12-28 LAB — D-DIMER, QUANTITATIVE: D-Dimer, Quant: 0.52 ug/mL-FEU — ABNORMAL HIGH (ref 0.00–0.50)

## 2019-12-28 LAB — C-REACTIVE PROTEIN: CRP: 8.5 mg/dL — ABNORMAL HIGH (ref <1.0)

## 2019-12-28 LAB — MAGNESIUM: Magnesium: 2.2 mg/dL (ref 1.7–2.4)

## 2019-12-28 LAB — PHOSPHORUS: Phosphorus: 2.7 mg/dL (ref 2.5–4.6)

## 2019-12-28 MED ORDER — NON FORMULARY: 450.0000 ug | Freq: Two times a day (BID) | Status: DC

## 2019-12-28 MED ORDER — LIP MEDEX EX OINT
TOPICAL_OINTMENT | CUTANEOUS | Status: DC | PRN
  Filled 2019-12-28: qty 7

## 2019-12-28 MED ORDER — BUPRENORPHINE HCL 450 MCG BU FILM: 450.0000 ug | ORAL_FILM | Freq: Two times a day (BID) | BUCCAL | Status: DC

## 2019-12-28 MED ORDER — BUPRENORPHINE HCL 450 MCG BU FILM
450.0000 ug | ORAL_FILM | Freq: Two times a day (BID) | BUCCAL | Status: DC
  Administered 2019-12-29 – 2019-12-30 (×4): 450 ug via BUCCAL

## 2019-12-28 MED ORDER — BUPRENORPHINE HCL 450 MCG BU FILM: 1.0000 | ORAL_FILM | Freq: Two times a day (BID) | BUCCAL | Status: DC

## 2019-12-28 MED ORDER — NON FORMULARY
450.0000 ug | Freq: Two times a day (BID) | Status: DC
  Administered 2019-12-28: 450 ug via BUCCAL

## 2019-12-28 NOTE — Progress Notes (Signed)
   12/27/19 2322  Assess: MEWS Score  Pulse Rate 88  ECG Heart Rate 88  Resp (!) 33  SpO2 (!) 86 %  O2 Device Nasal Cannula  Patient Activity (if Appropriate)  (Patient up to [facility]  O2 Flow Rate (L/min) 2 L/min  Assess: MEWS Score  MEWS Temp 0  MEWS Systolic 0  MEWS Pulse 0  MEWS RR 2  MEWS LOC 0  MEWS Score 2  MEWS Score Color Yellow  Assess: if the MEWS score is Yellow or Red  Were vital signs taken at a resting state? No  Early Detection of Sepsis Score *See Row Information* Low  MEWS guidelines implemented *See Row Information* No, vital signs rechecked   Patient up to [facility] a few times. SpO2 drops, lowest noted is 86% on O2 2L [place]. This occurs multiple times. Patient states she does feel SOB, mostly with exertion. SpO2 increases to above 90% on O2 2L quickly each time. SpO2 drops to 89% at times on O2 2L while patient resting in bed but not maintained. Will continue to monitor.

## 2019-12-28 NOTE — Progress Notes (Addendum)
  Date: 12/28/2019  Patient name: [name]  Medical record number: [id number]  Date of birth: February 11, 1971    Subjective: Feeling better today, but very tired.  Nausea has improved, no further vomiting.  Mostly just feeling rundown.  Still getting short of breath when she moves around the room.  Objective:  Vital signs in last 24 hours: Vitals:   12/28/19 0301 12/28/19 0315 12/28/19 0440 12/28/19 0815  BP:  (!) 156/92 (!) 158/99 (!) 157/100  Pulse: (!) 102 84 79 93  Resp: (!) 24 18 15  (!) 21  Temp:  98.5 F (36.9 C)  98 F (36.7 C)  TempSrc:  Oral  Oral  SpO2: (!) 87% 92% 92% 92%  Weight:      Height:        Physical Exam: General: Obese woman, lying in bed, appears comfortable HEENT: Moist mucous membranes, nasal cannula in place Cardiovascular: Regular rate and rhythm, no murmurs rubs or gallops Pulmonary: Lungs clear to auscultation bilaterally, normal respiratory effort Abdominal: Soft, nontender, nondistended, normal bowel sounds Extremities: No swelling or synovitis  Significant new test results: Ferritin 97, from 83 CRP 8.5, down from 23 D-dimer 0.5  Assessment/Plan:  Principal Problem:   Pneumonia due to COVID-19 virus Active Problems:   PANIC DISORDER   Asthma   Gastroparesis   Morbid obesity ([place])  49 year old woman with a history of chronic pain on buprenorphine, fibromyalgia, anxiety, hypothyroidism, HTN, GERD, and obesity admitted with COVID-19 pneumonia and gastroenteritis.  #COVID-19 pneumonia with gastroenteritis and hypoxic respiratory failure: Respiratory status is stable, but she still requires oxygen, particularly with exertion.  GI symptoms are improving. -Continue remdesivir 100 mg daily (day 3/5) -Dexamethasone 6 mg daily (day 3) -Robitussin 10 mL every 4 hours as needed for coughing -Acetaminophen 650 mg every 6 hours as needed for pain or fever -Metoclopramide 10 mg every 6 hours as needed for nausea -Continue trending daily  inflammatory labs and airborne precautions -Can stop telemetry today  #Chronic pain: She takes buprenorphine films, typically 450 mcg every 12 hours, although at home she sometimes takes a lower dose with 300 mcg if her pain is well controlled.  According to pharmacy, we do not have the same formulation as her home medication here.  To avoid inappropriate dosing, we will have her use her home supply with her permission. -Buprenorphine 450 mcg every 12 hours, patient supplied medication  #HTN: Continue home amlodipine 2.5 mg daily and bisoprolol 10 mg daily  #Anxiety: Continue home alprazolam 0.5 mg twice daily as needed for anxiety and citalopram 40 mg daily  #FEN/GI -Diet: Heart Healthy -Fluids: NS 75 cc/hr -Zinc capsule 220 mg daily -Vit C 500 mg daily  #DVT prophylaxis -Lovenox 40 mg subq injections daily  #CODE STATUS: FULL  #Dispo: Anticipated discharge in approximately 1-2 days  Prior to Admission Living Arrangement: Home Anticipated Discharge Location: Home Barriers to Discharge:  Requiring IV medications and oxygen support for COVID-19 pneumonia  Lenice Pressman, M.D., Ph.D. 12/28/2019, 2:31 PM

## 2019-12-29 LAB — COMPREHENSIVE METABOLIC PANEL
ALT: 18 U/L (ref 0–44)
AST: 22 U/L (ref 15–41)
Albumin: 2.7 g/dL — ABNORMAL LOW (ref 3.5–5.0)
Alkaline Phosphatase: 49 U/L (ref 38–126)
Anion gap: 10 (ref 5–15)
BUN: 6 mg/dL (ref 6–20)
CO2: 24 mmol/L (ref 22–32)
Calcium: 8.1 mg/dL — ABNORMAL LOW (ref 8.9–10.3)
Chloride: 106 mmol/L (ref 98–111)
Creatinine, Ser: 0.66 mg/dL (ref 0.44–1.00)
GFR calc Af Amer: >60 mL/min (ref >60)
GFR calc non Af Amer: >60 mL/min (ref >60)
Glucose, Bld: 166 mg/dL — ABNORMAL HIGH (ref 70–99)
Potassium: 3.1 mmol/L — ABNORMAL LOW (ref 3.5–5.1)
Sodium: 140 mmol/L (ref 135–145)
Total Bilirubin: 0.7 mg/dL (ref 0.3–1.2)
Total Protein: 5.9 g/dL — ABNORMAL LOW (ref 6.5–8.1)

## 2019-12-29 LAB — CBC WITH DIFFERENTIAL/PLATELET
Abs Immature Granulocytes: 0.04 10*3/uL (ref 0.00–0.07)
Basophils Absolute: 0 10*3/uL (ref 0.0–0.1)
Basophils Relative: 0 %
Eosinophils Absolute: 0 10*3/uL (ref 0.0–0.5)
Eosinophils Relative: 0 %
HCT: 36.2 % (ref 36.0–46.0)
Hemoglobin: 11.9 g/dL — ABNORMAL LOW (ref 12.0–15.0)
Immature Granulocytes: 1 %
Lymphocytes Relative: 17 %
Lymphs Abs: 0.7 10*3/uL (ref 0.7–4.0)
MCH: 28.9 pg (ref 26.0–34.0)
MCHC: 32.9 g/dL (ref 30.0–36.0)
MCV: 87.9 fL (ref 80.0–100.0)
Monocytes Absolute: 0.1 10*3/uL (ref 0.1–1.0)
Monocytes Relative: 3 %
Neutro Abs: 3 10*3/uL (ref 1.7–7.7)
Neutrophils Relative %: 79 %
Platelets: 285 10*3/uL (ref 150–400)
RBC: 4.12 MIL/uL (ref 3.87–5.11)
RDW: 12.4 % (ref 11.5–15.5)
WBC: 3.8 10*3/uL — ABNORMAL LOW (ref 4.0–10.5)
nRBC: 0 % (ref 0.0–0.2)

## 2019-12-29 LAB — FERRITIN: Ferritin: 67 ng/mL (ref 11–307)

## 2019-12-29 LAB — C-REACTIVE PROTEIN: CRP: 3.7 mg/dL — ABNORMAL HIGH (ref <1.0)

## 2019-12-29 LAB — MAGNESIUM: Magnesium: 1.9 mg/dL (ref 1.7–2.4)

## 2019-12-29 LAB — PHOSPHORUS: Phosphorus: 3.1 mg/dL (ref 2.5–4.6)

## 2019-12-29 LAB — D-DIMER, QUANTITATIVE: D-Dimer, Quant: 0.41 ug/mL-FEU (ref 0.00–0.50)

## 2019-12-29 MED ORDER — POTASSIUM CHLORIDE CRYS ER 20 MEQ PO TBCR
40.0000 meq | EXTENDED_RELEASE_TABLET | Freq: Once | ORAL | Status: AC
  Administered 2019-12-29: 40 meq via ORAL
  Filled 2019-12-29: qty 2

## 2019-12-29 NOTE — Progress Notes (Signed)
Pt desat to 86% sustained on RA. Pt placed back on 1L 

## 2019-12-29 NOTE — Progress Notes (Signed)
Subjective:  Yesterday, the patient tried to ambulate without her oxygen to use the restroom and desaturated to the 80s.  Her O2 saturations immediately picked back up to the 90s with 1 L supplemental oxygen.  Pt was seen at the bedside today.  Patient states that she is feeling better today than when she first came in.  She is continuing to have some loose stools, but they are more formed and less frequent.  She is also eating applesauce, Jell-O and drinking some fluids and is able to tolerate it without vomiting.  Denies SOB, but has some coughing with use of the incentive spirometer.  All concerns were addressed.  No further complaints at this time.  Objective: CBC Latest Ref Rng & Units 12/29/2019 12/28/2019 12/27/2019  WBC 4.0 - 10.5 K/uL 3.8(L) 3.6(L) 3.2(L)  Hemoglobin 12.0 - 15.0 g/dL 11.9(L) 12.3 11.8(L)  Hematocrit 36.0 - 46.0 % 36.2 38.1 37.3  Platelets 150 - 400 K/uL 285 245 187   BMP Latest Ref Rng & Units 12/29/2019 12/28/2019 12/27/2019  Glucose 70 - 99 mg/dL 166(H) 130(H) 144(H)  BUN 6 - 20 mg/dL 6 5(L) 7  Creatinine 0.44 - 1.00 mg/dL 0.66 0.70 0.66  Sodium 135 - 145 mmol/L 140 141 141  Potassium 3.5 - 5.1 mmol/L 3.1(L) 4.1 4.1  Chloride 98 - 111 mmol/L 106 105 106  CO2 22 - 32 mmol/L 24 25 25   Calcium 8.9 - 10.3 mg/dL 8.1(L) 8.5(L) 7.9(L)   Vital signs in last 24 hours: Vitals:   12/29/19 0623 12/29/19 0630 12/29/19 0640 12/29/19 0649  BP:      Pulse: 80 (!) 50 (!) 51 (!) 50  Resp:      Temp:      TempSrc:      SpO2: 91% 91% (!) 89% 90%  Weight:      Height:       Physical Exam General: Appears comfortable, sitting up in bed HEENT: NCAT, [place] in place CV: RRR, normal S1-S2 no murmurs rubs or gallops appreciated PULM: Clear to auscultation in all lung fields, no wheezing or crackles ABD: Soft and nontender in all quadrants NEURO: Alert and oriented, nonfocal  Assessment/Plan:  Principal Problem:   Pneumonia due to COVID-19 virus Active Problems:   PANIC  DISORDER   Asthma   Gastroparesis   Morbid obesity ([place])  In summary, [name] is a 49 y.o F with a hx of chronic pain on Belbuca, fibromyalgia, anxiety, hypothyroidism, HTN, GERD, and obesity admitted with COVID-19 pneumonia and gastroenteritis.     #COVID-19 Pneumonia: Patient was diagnosed with SARS-CoV-2 on 4/17 and her SOB, fevers, and coughing worsened. She received a dose of monoclonal antibodies the day before admission and then subsequently developed increased nausea, vomiting and diarrhea. Inflammatory labs are mildly elevated and trending downward.  Currently, the patient is feeling better, noting less frequent bowel movements and denies S OB at this time. -Remdesivir 100 mg daily (day 4/5) -Dexamethasone 6 mg daily (day 4) -Robitussin 10 mL every 4 hours as needed for coughing -Tylenol 650 mg every 6 hours as needed -Airborne precautions -Telemetry -Daily inflammatory lab trending   #Chronic pain: Patient takes buprenorphine films, 450 mcg every 12 hours.  We do not have buprenorphine films in house, so to avoid inappropriate dosing we are allowing her to use her home supply with her permission. -Buprenorphine 450 mcg every 12 hours  #Nausea #Vomiting #Diarrhea: Improving. Patient is having less nausea and less frequent bowel movements.  Although they  are still loose, they are more formed than they were when they came in and less frequent. Patient does have a history of recurrent C. difficile infection with antibiotic use, but she currently does not have any abdominal pain or blood in her diarrhea. Low suspicion for active C. difficile infection. -Reglan 10 mg q6hrs PRN   #HTN -Amlodipine 2.5 mg daily -Bisoprolol 10 mg daily   #Anxiety -Xanax 0.5 mg twice daily PRN -Citalopram 40 mg daily  #FEN/GI -Diet: Heart Healthy -Fluids: NS 75 cc/hr, will d/c this evening after current bag runs out -Reglan 10 mg q6hrs PRN -Zinc capsule 220 mg daily -Vit C 500 mg daily  #DVT  prophylaxis -Lovenox 45 mg subq injections daily  #CODE STATUS: FULL  #Dispo: Anticipated discharge in approximately tomorrow  Prior to Admission Living Arrangement: Home Anticipated Discharge Location: Home Barriers to Discharge: Medical workup  Earlene Plater, MD Internal Medicine, PGY1 Pager: [phone]  12/29/2019,11:39 AM

## 2019-12-29 NOTE — Progress Notes (Signed)
   12/28/19 2122  Assess: MEWS Score  Temp 98.3 F (36.8 C)  BP (!) 164/94  Pulse Rate 78  SpO2 90 %  O2 Device Nasal Cannula  Patient Activity (if Appropriate) In bed  O2 Flow Rate (L/min) 2 L/min  Assess: MEWS Score  MEWS Temp 0  MEWS Systolic 0  MEWS Pulse 0  MEWS RR 0  MEWS LOC 0  MEWS Score 0  MEWS Score Color Nyoka Cowden  Notify: Provider  Provider Temple Pacini, MD  Date Provider Notified 12/28/19  Time Provider Notified 2131  Notification Type Page  Notification Reason Other (Comment) (Hypertension)  Response No new orders (Continue to monitor.)  Date of Provider Response 12/28/19  Time of Provider Response 2134   Patient resting in bed, no distress noted. Patient states that she does have a headache. Tylenol given PO per PRN orders. Patient states that she takes Telmisartan at home but is not getting here. Per patient, MD on dayshift stated that they would check on medication availability. Madilyn Fireman, MD tonight acknowledges that patient's BP have been slightly elevated this admission, and states to continue to monitor. Patient resting in bed at this time, denies needs. Will continue to monitor.

## 2019-12-29 NOTE — Progress Notes (Signed)
   12/29/19 0618  Assess: MEWS Score  Pulse Rate 91  SpO2 (!) 83 %  O2 Device Room Air  Patient Activity (if Appropriate) Other (Comment) (Patient up to [facility].)  Assess: MEWS Score  MEWS Temp 0  MEWS Systolic 0  MEWS Pulse 0  MEWS RR 0  MEWS LOC 0  MEWS Score 0  MEWS Score Color Green   BP slightly improved at 159/99. Patient has slept most shift. SpO2 maintained 90% or more on O2 1L [place] while resting in bed. Patient encouraged to ambulate to bathroom when able but has only used BSC through the night. This AM, patient transferred to [facility] and back to bed without O2. SpO2 dropped as low as 83% on RA. Patient did not seem to have distress. Patient back to bed with O2 1L [place] with SpO2 quickly recovering to 91%. Will continue to monitor.

## 2019-12-30 ENCOUNTER — Telehealth: Payer: Self-pay | Admitting: Internal Medicine

## 2019-12-30 LAB — COMPREHENSIVE METABOLIC PANEL
ALT: 21 U/L (ref 0–44)
AST: 27 U/L (ref 15–41)
Albumin: 2.8 g/dL — ABNORMAL LOW (ref 3.5–5.0)
Alkaline Phosphatase: 48 U/L (ref 38–126)
Anion gap: 8 (ref 5–15)
BUN: 7 mg/dL (ref 6–20)
CO2: 25 mmol/L (ref 22–32)
Calcium: 8.2 mg/dL — ABNORMAL LOW (ref 8.9–10.3)
Chloride: 108 mmol/L (ref 98–111)
Creatinine, Ser: 0.74 mg/dL (ref 0.44–1.00)
GFR calc Af Amer: >60 mL/min (ref >60)
GFR calc non Af Amer: >60 mL/min (ref >60)
Glucose, Bld: 135 mg/dL — ABNORMAL HIGH (ref 70–99)
Potassium: 3.8 mmol/L (ref 3.5–5.1)
Sodium: 141 mmol/L (ref 135–145)
Total Bilirubin: 0.7 mg/dL (ref 0.3–1.2)
Total Protein: 5.9 g/dL — ABNORMAL LOW (ref 6.5–8.1)

## 2019-12-30 LAB — CBC WITH DIFFERENTIAL/PLATELET
Abs Immature Granulocytes: 0.12 10*3/uL — ABNORMAL HIGH (ref 0.00–0.07)
Basophils Absolute: 0 10*3/uL (ref 0.0–0.1)
Basophils Relative: 0 %
Eosinophils Absolute: 0 10*3/uL (ref 0.0–0.5)
Eosinophils Relative: 0 %
HCT: 37 % (ref 36.0–46.0)
Hemoglobin: 11.8 g/dL — ABNORMAL LOW (ref 12.0–15.0)
Immature Granulocytes: 3 %
Lymphocytes Relative: 20 %
Lymphs Abs: 1 10*3/uL (ref 0.7–4.0)
MCH: 28.2 pg (ref 26.0–34.0)
MCHC: 31.9 g/dL (ref 30.0–36.0)
MCV: 88.3 fL (ref 80.0–100.0)
Monocytes Absolute: 0.3 10*3/uL (ref 0.1–1.0)
Monocytes Relative: 7 %
Neutro Abs: 3.5 10*3/uL (ref 1.7–7.7)
Neutrophils Relative %: 70 %
Platelets: 235 10*3/uL (ref 150–400)
RBC: 4.19 MIL/uL (ref 3.87–5.11)
RDW: 12.5 % (ref 11.5–15.5)
WBC: 4.9 10*3/uL (ref 4.0–10.5)
nRBC: 0 % (ref 0.0–0.2)

## 2019-12-30 LAB — D-DIMER, QUANTITATIVE: D-Dimer, Quant: 0.34 ug/mL-FEU (ref 0.00–0.50)

## 2019-12-30 LAB — C-REACTIVE PROTEIN: CRP: 1.8 mg/dL — ABNORMAL HIGH (ref <1.0)

## 2019-12-30 LAB — MAGNESIUM: Magnesium: 2 mg/dL (ref 1.7–2.4)

## 2019-12-30 LAB — FERRITIN: Ferritin: 67 ng/mL (ref 11–307)

## 2019-12-30 LAB — PHOSPHORUS: Phosphorus: 3.3 mg/dL (ref 2.5–4.6)

## 2019-12-30 NOTE — Progress Notes (Signed)
Returned pt med from [employer]. Documentation in chart

## 2019-12-30 NOTE — Progress Notes (Signed)
Ambulated patient in hall on room air, no DOE, sats remain above 90 on room air.

## 2019-12-30 NOTE — Care Management (Signed)
Pt deemed stable for discharge home today.  CM reviewed chart for TOC needs - none found.  Discharge order written - CM signing off

## 2019-12-30 NOTE — Discharge Summary (Signed)
Name: [name] MRN: [id number] DOB: July 24, 1971 49 y.o. PCP: Deland Pretty, MD  Date of Admission: 12/26/2019  2:29 PM Date of Discharge: 12/30/2019 Attending Physician: Oda Kilts, MD  Discharge Diagnosis: 1. COVID-19 Pneumonia   Discharge Medications: Allergies as of 12/30/2019      Reactions   Allegra [fexofenadine Hcl] Shortness Of Breath, Rash   Fexofenadine Shortness Of Breath, Rash, Other (See Comments)   Shakes, can't sleep, trouble breathing   Oseltamivir Other (See Comments)   Can't breathe, sick to stomach   Tamiflu [oseltamivir Phosphate] Itching, Nausea And Vomiting, Other (See Comments)   Can't breathe, sick to stomach   Vancomycin Other (See Comments)   Red man's Syndrome.   Diphenhydramine Other (See Comments)   Shakes, can't sleep, trouble breathing   Latex Itching, Dermatitis, Rash   Sulfamethoxazole Rash   Aspirin Nausea And Vomiting   Doxycycline    Other    NUT ALLERGY   Diphenhydramine Hcl Palpitations, Rash   Morphine Other (See Comments)   REACTION: insomnia   Oseltamivir Phosphate Itching, Nausea And Vomiting   Penicillins Itching, Swelling   Can tolerate Amoxicillin OK.   Sulfonamide Derivatives Rash      Medication List    TAKE these medications   albuterol 108 (90 Base) MCG/ACT inhaler Commonly known as: ProAir HFA Inhale 2 puffs into the lungs every 6 (six) hours as needed for wheezing or shortness of breath.   ALIGN PO Take 1 capsule by mouth daily.   ALPRAZolam 0.5 MG 24 hr tablet Commonly known as: XANAX XR Take 0.5 mg by mouth in the morning and at bedtime.   amLODipine 2.5 MG tablet Commonly known as: NORVASC Take 1 tablet (2.5 mg total) by mouth daily. NEED OV.   baclofen 20 MG tablet Commonly known as: LIORESAL Take 20 mg by mouth as needed.   Belbuca 450 MCG Film Generic drug: Buprenorphine HCl Place 1 Film inside cheek in the morning and at bedtime.   bisoprolol 10 MG tablet Commonly known as:  ZEBETA Take 10 mg by mouth daily.   busPIRone 15 MG tablet Commonly known as: BUSPAR 1/3 tablet twice daily for 1 week, then 2/3 twice daily for 1 week, then 1 twice daily   cetirizine 10 MG tablet Commonly known as: ZYRTEC Take 10 mg by mouth daily.   citalopram 40 MG tablet Commonly known as: CELEXA Take 40 mg by mouth daily.   colesevelam 625 MG tablet Commonly known as: WELCHOL Take 625 mg by mouth 2 (two) times daily with a meal.   Duexis 800-26.6 MG Tabs Generic drug: Ibuprofen-Famotidine Take 1 tablet by mouth daily.   fluticasone 50 MCG/ACT nasal spray Commonly known as: FLONASE Place 1 spray into both nostrils daily.   hydrOXYzine 25 MG tablet Commonly known as: ATARAX/VISTARIL Take 25 mg by mouth as needed for anxiety.   Hyoscyamine Sulfate SL 0.125 MG Subl Take 0.125 mg by mouth every 4 (four) hours as needed for cramping.   Melatonin 10 MG Tabs Take 10 mg by mouth at bedtime.   methocarbamol 500 MG tablet Commonly known as: Robaxin Take 1 tablet (500 mg total) by mouth 3 (three) times daily. What changed:   when to take this  reasons to take this   norethindrone-ethinyl estradiol 1-20 MG-MCG tablet Commonly known as: Junel FE 1/20 Take 1 tablet by mouth daily. Continuous use for Dysmenorrhea   predniSONE 10 MG tablet Commonly known as: DELTASONE Take 4 tabs po daily x 2  days; then 3 tabs for 2 days; then 2 tabs for 2 days; then 1 tab for 2 days   prochlorperazine 10 MG tablet Commonly known as: COMPAZINE Take 10 mg by mouth every 6 (six) hours as needed for nausea or vomiting.   promethazine 25 MG tablet Commonly known as: PHENERGAN Take 25 mg by mouth every 6 (six) hours as needed for nausea or vomiting.   rOPINIRole 2 MG tablet Commonly known as: REQUIP Take 2 mg by mouth at bedtime.   scopolamine 1 MG/3DAYS Commonly known as: TRANSDERM-SCOP Place 1 mg onto the skin every three (3) days as needed. For 30 days   telmisartan 80 MG  tablet Commonly known as: MICARDIS Take 80 mg by mouth daily.   thyroid 90 MG tablet Commonly known as: ARMOUR Take 90 mg by mouth daily. NP Thyroid   vitamin B-12 500 MCG tablet Commonly known as: CYANOCOBALAMIN Take 500 mcg by mouth daily.   VITAMIN D PO Take 1 tablet by mouth daily.      Disposition and follow-up:   Ms.[name] was discharged from [facility] in [place] condition.  At the hospital follow up visit please address:  1.  COVID-19 Pneumonia: Diagnosed on 4/17 after testing positive. Received 1 dose of monoclonal antibodies day before admission, then subsequently developed increased nausea vomiting and diarrhea which led to her current admission.  Received 5 days of remdesivir and dexamethasone.  She was weaned from 2 L supplemental O2 to room air with ambulation on the day of discharge. -F/u with PCP in 1-2 weeks  2.  Labs / imaging needed at time of follow-up: CRP, ferritin, D-dimer  3.  Pending labs/ test needing follow-up: None  Follow-up Appointments:   Hospital Course by problem list: 1. COVID-19 Pneumonia  In summary, [name] is a 49 year old female with a history of chronic pain on Belbuca, fibromyalgia, anxiety, hypothyroidism, HTN, GERD, and obesity who presented with 2 weeks history of increased SLB, cough and fevers (tmax 102) in the setting of a recent Covid diagnosis. The patient tested positive on 4/17, but her symptoms had not improved despite taking as needed Tylenol for the fevers. The patient did receive 1 dose of monoclonal antibodies the day prior to admission, but subsequently developed increased nausea, vomiting and diarrhea. She was admitted for increased O2 requirement (2L [place]) as well as inability to tolerate p.o. intake.  The patient was hospitalized and received IV remdesivir and dexamethasone for 5 days and had improvements in her O2 saturations.  She was weaned from 2 L nasal cannula down to room air on ambulation on  the day of discharge.  Her nausea, vomiting and diarrhea had also resolved.  Patient did have some mild loose stools on the day of discharge, but was much better than when she first came in.  Patient was instructed to follow-up with her primary care provider within the next week to 2 weeks and she is in agreement with this plan.  Discharge Vitals:   BP (!) 156/93 (BP Location: Right Wrist)   Pulse 61   Temp 98.7 F (37.1 C) (Oral)   Resp (!) 22   Ht 5\' 1"  (1.549 m)   Wt 90.5 kg   SpO2 93%   BMI 37.70 kg/m   Pertinent Labs, Studies, and Procedures:  CBC Latest Ref Rng & Units 12/30/2019 12/29/2019 12/28/2019  WBC 4.0 - 10.5 K/uL 4.9 3.8(L) 3.6(L)  Hemoglobin 12.0 - 15.0 g/dL 11.8(L) 11.9(L) 12.3  Hematocrit  36.0 - 46.0 % 37.0 36.2 38.1  Platelets 150 - 400 K/uL 235 285 245   CMP Latest Ref Rng & Units 12/30/2019 12/29/2019 12/28/2019  Glucose 70 - 99 mg/dL 135(H) 166(H) 130(H)  BUN 6 - 20 mg/dL 7 6 5(L)  Creatinine 0.44 - 1.00 mg/dL 0.74 0.66 0.70  Sodium 135 - 145 mmol/L 141 140 141  Potassium 3.5 - 5.1 mmol/L 3.8 3.1(L) 4.1  Chloride 98 - 111 mmol/L 108 106 105  CO2 22 - 32 mmol/L 25 24 25   Calcium 8.9 - 10.3 mg/dL 8.2(L) 8.1(L) 8.5(L)  Total Protein 6.5 - 8.1 g/dL 5.9(L) 5.9(L) 6.6  Total Bilirubin 0.3 - 1.2 mg/dL 0.7 0.7 0.7  Alkaline Phos 38 - 126 U/L 48 49 52  AST 15 - 41 U/L 27 22 17   ALT 0 - 44 U/L 21 18 17    Ferritin 11 - 307 ng/mL 67  67 CM  97 CM    CRP <1.0 mg/dL 1.8High   3.7High  CM  8.5High    D-Dimer, Quant 0.00 - 0.50 ug/mL-FEU 0.34  0.41 CM  0.52High    CXR: IMPRESSION: Bilateral peripheral pulmonary infiltrates, left greater than right, consistent with COVID-19 pneumonia given history.  Discharge Instructions: Discharge Instructions    Call MD for:  difficulty breathing, headache or visual disturbances   Complete by: As directed    Call MD for:  extreme fatigue   Complete by: As directed    Call MD for:  hives   Complete by: As directed    Call MD  for:  persistant dizziness or light-headedness   Complete by: As directed    Call MD for:  persistant nausea and vomiting   Complete by: As directed    Call MD for:  severe uncontrolled pain   Complete by: As directed    Call MD for:  temperature >100.4   Complete by: As directed    Diet - low sodium heart healthy   Complete by: As directed    Discharge instructions   Complete by: As directed    Thank you for allowing [place] to take care of you during your hospitalization.  Below is a summary of what we discussed and treated:  1.  COVID-19 pneumonia -We gave you steroids and medication called remdesivir to treat your pneumonia. -We recommend that you try to stay as distant from others as much as possible until you are asymptomatic for 10 days.  2. Follow-up -Please call your primary care provider to set up an appointment with them   Increase activity slowly   Complete by: As directed      Signed: Earlene Plater, MD Internal Medicine, PGY1 Pager: [phone]  12/30/2019,2:52 PM

## 2019-12-30 NOTE — Telephone Encounter (Signed)
Spoke with Sharyn Lull with [facility]. States that pt is needing to be scheduled for a PFT. Pt was scheduled for a PFT on 01/21/20 but it was canceled by our office, cancel reason states Schedule Order Error. Pt's condition is being monitored by work comp. Sharyn Lull is very adamant that pt has a PFT. I do not see any mention of a PFT being needed in her OV notes.  MW - please advise if we can order this PFT. Thanks.

## 2019-12-30 NOTE — Progress Notes (Addendum)
IV and tele removed. Patient given discharge instruction and teaching. No new questions or concerns. Awaiting father to transport home

## 2019-12-30 NOTE — Progress Notes (Signed)
   12/30/19 0409  Assess: MEWS Score  Pulse Rate 90  SpO2 (!) 89 %  O2 Device Room Air  Patient Activity (if Appropriate) Other (Comment) (Patient up to [facility].)  Assess: MEWS Score  MEWS Temp 0  MEWS Systolic 0  MEWS Pulse 0  MEWS RR 1  MEWS LOC 0  MEWS Score 1  MEWS Score Color Green   Patient slept most of the night, tolerating being on RA. SpO2 mostly maintained 90-94% on RA while patient resting in bed. Patient continues to use BSC. SpO2 dropped to 89% on RA while patient up to [facility], but SpO2 immediately recovered to 92% on RA. Patient states that she feels better, just tired. Will continue to monitor.

## 2019-12-30 NOTE — Telephone Encounter (Signed)
She had one 6 years ago that was basically nl  She is now admitted with covid so it will be 3 months before repeat pfts should be considered both to reduce transmission of virus and also to reflect the problem she had prior to covid ie her baseline won't likely be reached until then anyway

## 2019-12-30 NOTE — Progress Notes (Signed)
Subjective:   Pt seen at the bedside this AM.  Patient is in good spirits, and continues to feel better than the day prior.  She states her stool is more formed and less frequent today than it was the day prior.  She says her breathing feels normal as well.  She agrees that she is ready to go home today.  Patient is saturating in the mid to upper 90s with 1 L supplemental O2.  She took off her Egan, and continued to saturate well.  All concerns were addressed.  Objective: CBC Latest Ref Rng & Units 12/29/2019 12/28/2019 12/27/2019  WBC 4.0 - 10.5 K/uL 3.8(L) 3.6(L) 3.2(L)  Hemoglobin 12.0 - 15.0 g/dL 11.9(L) 12.3 11.8(L)  Hematocrit 36.0 - 46.0 % 36.2 38.1 37.3  Platelets 150 - 400 K/uL 285 245 187   BMP Latest Ref Rng & Units 12/29/2019 12/28/2019 12/27/2019  Glucose 70 - 99 mg/dL 166(H) 130(H) 144(H)  BUN 6 - 20 mg/dL 6 5(L) 7  Creatinine 0.44 - 1.00 mg/dL 0.66 0.70 0.66  Sodium 135 - 145 mmol/L 140 141 141  Potassium 3.5 - 5.1 mmol/L 3.1(L) 4.1 4.1  Chloride 98 - 111 mmol/L 106 105 106  CO2 22 - 32 mmol/L 24 25 25   Calcium 8.9 - 10.3 mg/dL 8.1(L) 8.5(L) 7.9(L)   Vital signs in last 24 hours: Vitals:   12/30/19 0409 12/30/19 0410 12/30/19 0420 12/30/19 0421  BP:   (!) 172/98 (!) 145/77  Pulse: 90 85 88 73  Resp:   (!) 24   Temp:   98.7 F (37.1 C)   TempSrc:   Oral   SpO2: (!) 89% 91% 94% 91%  Weight:      Height:       Physical Exam General: Sitting up in bed, NAD                     HEENT: NCAT, [place] in place CV: RRR, normal S1-S2 no murmurs rubs or gallops appreciated PULM: Clear to auscultation bilaterally, no crackles or wheezing in all lung fields ABD: Soft and nontender in all quadrants NEURO: Alert and oriented, nonfocal  Assessment/Plan:  Principal Problem:   Pneumonia due to COVID-19 virus Active Problems:   PANIC DISORDER   Asthma   Gastroparesis   Morbid obesity ([place])  In summary, Ms. Lovorn is a 49 y.o F with a hx of chronic pain on [place], fibromyalgia,  anxiety, hypothyroidism, HTN, GERD, and obesity admitted with COVID-19 pneumonia and gastroenteritis. The patient appears clinically medically stable for discharge home.   #COVID-19 Pneumonia: Patient was diagnosed with SARS-CoV-2 on 4/17 and her SOB, fevers, and coughing worsened. She received a dose of monoclonal antibodies the day before admission and then subsequently developed increased nausea, vomiting and diarrhea. Inflammatory labs are mildly elevated and trending downward. Currently, the patient is feeling better, noting less frequent bowel movements and denies SOB at this time. She ambulated with the nurse, and maintains her saturations greater than 90%. -Remdesivir 100 mg daily (day 5/5), d/c at discharge  -Dexamethasone 6 mg daily (day 5), d/c at discharge -Robitussin 10 mL every 4 hours as needed for coughing -Tylenol 650 mg every 6 hours as needed -Airborne precautions -Telemetry -Daily inflammatory lab trending   #Chronic pain: Patient takes buprenorphine films, 450 mcg every 12 hours.  We do not have buprenorphine films in house, so to avoid inappropriate dosing we are allowing her to use her home supply with her permission. -Buprenorphine 450 mcg  every 12 hours  #Gastroenteritis: Improving. Patient is having less nausea and less frequent bowel movements.  Although they are still loose, they are more formed than they were when they came in and less frequent. Patient does have a history of recurrent C. difficile infection with antibiotic use, but she currently does not have any abdominal pain or blood in her diarrhea. Low suspicion for active C. difficile infection. -Reglan 10 mg q6hrs PRN   #HTN -Amlodipine 2.5 mg daily -Bisoprolol 10 mg daily   #Anxiety -Xanax 0.5 mg twice daily PRN -Citalopram 40 mg daily  #FEN/GI -Diet: Heart Healthy -Fluids: NS 75 cc/hr, will d/c this evening after current bag runs out -Reglan 10 mg q6hrs PRN -Zinc capsule 220 mg daily -Vit C 500 mg  daily  #DVT prophylaxis -Lovenox 45 mg subq injections daily  #CODE STATUS: FULL  #Dispo: Anticipated discharge today  Prior to Admission Living Arrangement: Home Anticipated Discharge Location: Home Barriers to Discharge: None  Earlene Plater, MD Internal Medicine, PGY1 Pager: [phone]  12/30/2019,2:07 PM

## 2019-12-30 NOTE — Telephone Encounter (Signed)
Spoke with Sharyn Lull. She is aware of Dr. Gustavus Bryant response. Nothing further needed.

## 2019-12-31 LAB — CULTURE, BLOOD (ROUTINE X 2)
Culture: NO GROWTH
Culture: NO GROWTH

## 2020-01-01 ENCOUNTER — Other Ambulatory Visit: Payer: Self-pay | Admitting: Psychiatry

## 2020-01-01 DIAGNOSIS — F411 Generalized anxiety disorder: Secondary | ICD-10-CM

## 2020-01-13 DIAGNOSIS — N390 Urinary tract infection, site not specified: Secondary | ICD-10-CM | POA: Diagnosis not present

## 2020-01-13 DIAGNOSIS — I10 Essential (primary) hypertension: Secondary | ICD-10-CM | POA: Diagnosis not present

## 2020-01-13 DIAGNOSIS — U071 COVID-19: Secondary | ICD-10-CM | POA: Diagnosis not present

## 2020-01-13 DIAGNOSIS — Z09 Encounter for follow-up examination after completed treatment for conditions other than malignant neoplasm: Secondary | ICD-10-CM | POA: Diagnosis not present

## 2020-01-13 DIAGNOSIS — R7303 Prediabetes: Secondary | ICD-10-CM | POA: Diagnosis not present

## 2020-01-14 DIAGNOSIS — R1084 Generalized abdominal pain: Secondary | ICD-10-CM | POA: Diagnosis not present

## 2020-01-14 DIAGNOSIS — M542 Cervicalgia: Secondary | ICD-10-CM | POA: Diagnosis not present

## 2020-01-14 DIAGNOSIS — M5030 Other cervical disc degeneration, unspecified cervical region: Secondary | ICD-10-CM | POA: Diagnosis not present

## 2020-01-14 DIAGNOSIS — G894 Chronic pain syndrome: Secondary | ICD-10-CM | POA: Diagnosis not present

## 2020-01-15 ENCOUNTER — Ambulatory Visit: Payer: BC Managed Care – PPO | Admitting: Psychiatry

## 2020-01-16 DIAGNOSIS — U071 COVID-19: Secondary | ICD-10-CM | POA: Diagnosis not present

## 2020-01-16 DIAGNOSIS — L304 Erythema intertrigo: Secondary | ICD-10-CM | POA: Diagnosis not present

## 2020-01-16 DIAGNOSIS — J1282 Pneumonia due to coronavirus disease 2019: Secondary | ICD-10-CM | POA: Diagnosis not present

## 2020-01-16 DIAGNOSIS — N390 Urinary tract infection, site not specified: Secondary | ICD-10-CM | POA: Diagnosis not present

## 2020-01-19 ENCOUNTER — Ambulatory Visit: Payer: BC Managed Care – PPO | Admitting: Nurse Practitioner

## 2020-01-19 ENCOUNTER — Other Ambulatory Visit: Payer: Self-pay

## 2020-01-19 VITALS — BP 126/84 | HR 78 | Temp 97.3°F | Wt 199.5 lb

## 2020-01-19 DIAGNOSIS — Z8616 Personal history of COVID-19: Secondary | ICD-10-CM

## 2020-01-19 DIAGNOSIS — F488 Other specified nonpsychotic mental disorders: Secondary | ICD-10-CM

## 2020-01-19 DIAGNOSIS — J1282 Pneumonia due to coronavirus disease 2019: Secondary | ICD-10-CM | POA: Diagnosis not present

## 2020-01-19 DIAGNOSIS — R4189 Other symptoms and signs involving cognitive functions and awareness: Secondary | ICD-10-CM

## 2020-01-19 DIAGNOSIS — U071 COVID-19: Secondary | ICD-10-CM | POA: Diagnosis not present

## 2020-01-19 NOTE — Progress Notes (Signed)
@Patient  ID: [name], female    DOB: October 27, 1970, 49 y.o.   MRN: [id number]  Chief Complaint  Patient presents with  . Hospitalization Follow-up    COVID Positive 4/17. Admitted into hospital 4/23-4/27. Sx: Nausea, anxiety, mucus cough up, sweats and brain fog.     Referring provider: Deland Pretty, MD   45 year ols female with history of hypertension, asthma, GERD, IBS, hypothyroidism, anxiety, hyperlipidemia, and obesity. Diagnosed with Covid on 12/20/19.   Recent significant events:  12/26/19 - 12/30/19 Hospital admission: Admitted with Covid Pneumonia. Diagnosed on 4/17 after testing positive. Received 1 dose of monoclonal antibodies day before admission, then subsequently developed increased nausea vomiting and diarrhea which led to her current admission.  Received 5 days of remdesivir and dexamethasone.  She was weaned from 2 L supplemental O2 to room air with ambulation on the day of discharge. -F/u with PCP in 1-2 weeks -Labs / imaging needed at time of follow-up: CRP, ferritin, D-dimer   HPI Patient presents today for post Covid care clinic visit.  Patient was seen by her PCP last week and was referred to post Covid care clinic.  Patient states that she has improved since hospital discharge.  She does still complain of some ongoing anxiety, cough, memory loss and brain fog.  Patient does have a history of asthma and does have a follow-up appointment scheduled with pulmonary on May 26.  Patient was started on Trelegy and albuterol inhalers by her PCP after hospital discharge.  Patient reports that she has been checking her O2 sats at home and states that they do drop below 90 at night.  She has been using her mother's oxygen at night.  She states that for the past couple weeks this has improved.  Patient does see psychiatry for anxiety and does need to schedule a follow-up due to increased anxiety post Covid.  Patient states that since hospital discharge she has developed a UTI and  was placed on Macrobid by PCP.  She states that her UTI symptoms are improving.  Patient states that she does have periods of heart racing.  She is already followed by cardiology.  Patient states that during her hospital stay her hospitalist [place] her [place].  She has not followed up with OB/GYN as to when to start this medication back.Denies f/c/s, n/v/d, hemoptysis, PND, chest pain or edema.       Allergies  Allergen Reactions  . Allegra [Fexofenadine Hcl] Shortness Of Breath and Rash  . Fexofenadine Shortness Of Breath, Rash and Other (See Comments)    Shakes, can't sleep, trouble breathing  . Oseltamivir Other (See Comments)    Can't breathe, sick to stomach  . Tamiflu [Oseltamivir Phosphate] Itching, Nausea And Vomiting and Other (See Comments)    Can't breathe, sick to stomach  . Vancomycin Other (See Comments)    Red man's Syndrome.  . Diphenhydramine Other (See Comments)    Shakes, can't sleep, trouble breathing  . Latex Itching, Dermatitis and Rash  . Sulfamethoxazole Rash  . Aspirin Nausea And Vomiting  . Doxycycline   . Other     NUT ALLERGY  . Diphenhydramine Hcl Palpitations and Rash  . Morphine Other (See Comments)    REACTION: insomnia  . Oseltamivir Phosphate Itching and Nausea And Vomiting  . Penicillins Itching and Swelling    Can tolerate Amoxicillin OK.  . Sulfonamide Derivatives Rash    Immunization History  Administered Date(s) Administered  . Influenza,inj,Quad PF,6+ Mos 06/16/2019  Past Medical History:  Diagnosis Date  . Anxiety   . Arrhythmia    heart races when pt in pain  . Asthma   . Clostridium difficile infection 2014  . [place] Patient virus infection   . Fibromyalgia   . GERD (gastroesophageal reflux disease)   . Hypertension   . Hypothyroidism   . Immune deficiency disorder ([place])   . Lyme disease   . Mold contact confirmed    Toxic Mold syndrome  . Mononucleosis 5-10  . Ovarian cyst    Serous Cystadenofibroma-Left ovary  .  PONV (postoperative nausea and vomiting)   . Shortness of breath    on exertion  . Systolic murmur   . Thyroid disease     Tobacco History: Social History   Tobacco Use  Smoking Status Never Smoker  Smokeless Tobacco Never Used   Counseling given: Not Answered   Outpatient Encounter Medications as of 01/19/2020  Medication Sig  . albuterol (PROAIR HFA) 108 (90 Base) MCG/ACT inhaler Inhale 2 puffs into the lungs every 6 (six) hours as needed for wheezing or shortness of breath.  . ALPRAZolam (XANAX XR) 0.5 MG 24 hr tablet Take 0.5 mg by mouth in the morning and at bedtime.  Marland Kitchen amLODipine (NORVASC) 2.5 MG tablet Take 1 tablet (2.5 mg total) by mouth daily. NEED OV.  . baclofen (LIORESAL) 20 MG tablet Take 20 mg by mouth as needed.  . bisoprolol (ZEBETA) 10 MG tablet Take 10 mg by mouth daily.  . Buprenorphine HCl (BELBUCA) 450 MCG FILM Place 1 Film inside cheek in the morning and at bedtime.  . cetirizine (ZYRTEC) 10 MG tablet Take 10 mg by mouth daily.   . Cholecalciferol (VITAMIN D PO) Take 1 tablet by mouth daily.   . citalopram (CELEXA) 40 MG tablet Take 40 mg by mouth daily.  . fluticasone (FLONASE) 50 MCG/ACT nasal spray Place 1 spray into both nostrils daily.  . hydrOXYzine (ATARAX/VISTARIL) 25 MG tablet Take 25 mg by mouth as needed for anxiety.   . Hyoscyamine Sulfate SL 0.125 MG SUBL Take 0.125 mg by mouth every 4 (four) hours as needed for cramping.  . Melatonin 10 MG TABS Take 10 mg by mouth at bedtime.   . Probiotic Product (ALIGN PO) Take 1 capsule by mouth daily.   . prochlorperazine (COMPAZINE) 10 MG tablet Take 10 mg by mouth every 6 (six) hours as needed for nausea or vomiting.   . promethazine (PHENERGAN) 25 MG tablet Take 25 mg by mouth every 6 (six) hours as needed for nausea or vomiting.   Marland Kitchen rOPINIRole (REQUIP) 2 MG tablet Take 2 mg by mouth at bedtime.   Marland Kitchen scopolamine (TRANSDERM-SCOP) 1 MG/3DAYS Place 1 mg onto the skin every three (3) days as needed. For 30  days  . telmisartan (MICARDIS) 80 MG tablet Take 80 mg by mouth daily.  Marland Kitchen thyroid (ARMOUR) 90 MG tablet Take 90 mg by mouth daily. NP Thyroid  . vitamin B-12 (CYANOCOBALAMIN) 500 MCG tablet Take 500 mcg by mouth daily.  . busPIRone (BUSPAR) 15 MG tablet TAKE 1/3 TAB TWICE DAILY X1 WEEK, THEN 2/3 TAB TWICE DAILY X1 WEEK, THEN 1 TAB TWICE A DAY (Patient not taking: Reported on 01/19/2020)  . colesevelam (WELCHOL) 625 MG tablet Take 625 mg by mouth 2 (two) times daily with a meal.  . fluconazole (DIFLUCAN) 150 MG tablet Take 150 mg by mouth once.  . methocarbamol (ROBAXIN) 500 MG tablet Take 1 tablet (500 mg total) by mouth  3 (three) times daily. (Patient not taking: Reported on 01/19/2020)  . norethindrone-ethinyl estradiol (JUNEL FE 1/20) 1-20 MG-MCG tablet Take 1 tablet by mouth daily. Continuous use for Dysmenorrhea (Patient not taking: Reported on 01/19/2020)  . predniSONE (DELTASONE) 10 MG tablet Take 4 tabs po daily x 2 days; then 3 tabs for 2 days; then 2 tabs for 2 days; then 1 tab for 2 days (Patient not taking: Reported on 12/26/2019)  . TRELEGY ELLIPTA 200-62.5-25 MCG/INH AEPB    No facility-administered encounter medications on file as of 01/19/2020.     Review of Systems  Review of Systems  Constitutional: Positive for fatigue. Negative for fever.  HENT: Negative.   Respiratory: Positive for cough and shortness of breath.   Cardiovascular: Positive for palpitations. Negative for chest pain and leg swelling.  Gastrointestinal: Negative.   Allergic/Immunologic: Negative.   Neurological: Negative.   Psychiatric/Behavioral: Positive for decreased concentration.       Physical Exam  BP 126/84 (BP Location: Left Arm, Patient Position: Sitting, Cuff Size: Small)   Pulse 78   Temp (!) 97.3 F (36.3 C)   Wt 199 lb 8 oz (90.5 kg)   SpO2 98%   BMI 37.70 kg/m   Wt Readings from Last 5 Encounters:  01/19/20 199 lb 8 oz (90.5 kg)  12/27/19 199 lb 8 oz (90.5 kg)  12/23/19 201 lb  (91.2 kg)  11/11/19 204 lb (92.5 kg)  06/16/19 202 lb (91.6 kg)     Physical Exam Vitals and nursing note reviewed.  Constitutional:      General: She is not in acute distress.    Appearance: She is well-developed.  Cardiovascular:     Rate and Rhythm: Normal rate and regular rhythm.  Pulmonary:     Effort: Pulmonary effort is normal.     Breath sounds: Normal breath sounds.  Neurological:     Mental Status: She is alert and oriented to person, place, and time.       Imaging: DG Chest Portable 1 View  Result Date: 12/26/2019 CLINICAL DATA:  Shortness of breath.  Recent COVID-19 diagnosis. EXAM: PORTABLE CHEST 1 VIEW COMPARISON:  November 11, 2019 FINDINGS: Bilateral patchy peripheral pulmonary infiltrates. The heart size remains borderline but poorly assessed on a portable film. Mild interstitial prominence. No other acute abnormalities. IMPRESSION: Bilateral peripheral pulmonary infiltrates, left greater than right, consistent with COVID-19 pneumonia given history. Electronically Signed   By: Dorise Bullion III M.D   On: 12/26/2019 15:29   DG Abdomen Acute W/Chest  Result Date: 12/23/2019 CLINICAL DATA:  Cough, abdominal pain and vomiting. High fever. COVID-19 positive test 3 days ago. EXAM: DG ABDOMEN ACUTE W/ 1V CHEST COMPARISON:  07/12/2009 FINDINGS: Normal sized heart. Minimal linear atelectasis or scarring in the left lower lung zone and minimal patchy density at the right lateral lung base. Mild peribronchial thickening. Normal bowel gas pattern without free peritoneal air. Cholecystectomy clips. Unremarkable bones. IMPRESSION: 1. Minimal patchy atelectasis or pneumonia at the right lateral lung base. 2. Mild bronchitic changes. 3. No acute abdominal abnormality. Electronically Signed   By: Claudie Revering M.D.   On: 12/23/2019 16:34     Assessment & Plan:   History of COVID-19 Covid  Pneumonia:  Patient walked in office today - O2 sats stayed above 93% for entire  walk  Continue inhalers as prescribed by PCP  Keep follow up with Dr. Chase Caller - may need repeat imaging, may need PFT  Will check follow up labs today  Hand  out given on nutritional rehabilitation    Memory loss:  Will place referral to neurology  Hand out given on memory care   Anxiety:  Please follow up with psychiatry  Tachycardia:  Please keep follow up with cardiology   Contraception/hormone replacement:  Please follow up with GYN for guidance on when to restart Junel Fe   Follow up:  Follow up in 2 months or sooner if needed       [name], NP 01/20/2020

## 2020-01-19 NOTE — Patient Instructions (Addendum)
Covid  Pneumonia:  Patient walked in office today - O2 sats stayed above 93% for entire walk  Continue inhalers as prescribed by PCP  Keep follow up with Dr. Chase Caller - may need repeat imaging, may need PFT  Will check follow up labs today  Hand out given on nutritional rehabilitation    Memory loss:  Will place referral to neurology  Hand out given on memory care   Anxiety:  Please follow up with psychiatry  Tachycardia:  Please keep follow up with cardiology   Contraception/hormone replacement:  Please follow up with GYN for guidance on when to restart Jun[place] Fe   Follow up:  Follow up in 2 months or sooner if needed

## 2020-01-20 DIAGNOSIS — Z8616 Personal history of COVID-19: Secondary | ICD-10-CM | POA: Insufficient documentation

## 2020-01-20 DIAGNOSIS — R4189 Other symptoms and signs involving cognitive functions and awareness: Secondary | ICD-10-CM | POA: Insufficient documentation

## 2020-01-20 NOTE — Assessment & Plan Note (Signed)
Covid  Pneumonia:  Patient walked in office today - O2 sats stayed above 93% for entire walk  Continue inhalers as prescribed by PCP  Keep follow up with Dr. Chase Caller - may need repeat imaging, may need PFT  Will check follow up labs today  Hand out given on nutritional rehabilitation    Memory loss:  Will place referral to neurology  Hand out given on memory care   Anxiety:  Please follow up with psychiatry  Tachycardia:  Please keep follow up with cardiology   Contraception/hormone replacement:  Please follow up with GYN for guidance on when to restart Junel Fe   Follow up:  Follow up in 2 months or sooner if needed

## 2020-01-23 DIAGNOSIS — J1282 Pneumonia due to coronavirus disease 2019: Secondary | ICD-10-CM | POA: Diagnosis not present

## 2020-01-23 DIAGNOSIS — U071 COVID-19: Secondary | ICD-10-CM | POA: Diagnosis not present

## 2020-01-27 LAB — CBC WITH DIFFERENTIAL/PLATELET
Basophils Absolute: 0.1 10*3/uL (ref 0.0–0.2)
Basos: 1 %
EOS (ABSOLUTE): 0.4 10*3/uL (ref 0.0–0.4)
Eos: 5 %
Hematocrit: 39.5 % (ref 34.0–46.6)
Hemoglobin: 12.7 g/dL (ref 11.1–15.9)
Immature Grans (Abs): 0 10*3/uL (ref 0.0–0.1)
Immature Granulocytes: 1 %
Lymphocytes Absolute: 3.1 10*3/uL (ref 0.7–3.1)
Lymphs: 40 %
MCH: 28.4 pg (ref 26.6–33.0)
MCHC: 32.2 g/dL (ref 31.5–35.7)
MCV: 88 fL (ref 79–97)
Monocytes Absolute: 0.5 10*3/uL (ref 0.1–0.9)
Monocytes: 6 %
Neutrophils Absolute: 3.7 10*3/uL (ref 1.4–7.0)
Neutrophils: 47 %
Platelets: 332 10*3/uL (ref 150–450)
RBC: 4.47 x10E6/uL (ref 3.77–5.28)
RDW: 13.5 % (ref 11.7–15.4)
WBC: 7.7 10*3/uL (ref 3.4–10.8)

## 2020-01-27 LAB — COMPREHENSIVE METABOLIC PANEL
ALT: 19 IU/L (ref 0–32)
AST: 24 IU/L (ref 0–40)
Albumin/Globulin Ratio: 1.4 (ref 1.2–2.2)
Albumin: 4.2 g/dL (ref 3.8–4.8)
Alkaline Phosphatase: 88 IU/L (ref 48–121)
BUN/Creatinine Ratio: 14 (ref 9–23)
BUN: 12 mg/dL (ref 6–24)
Bilirubin Total: 0.2 mg/dL (ref 0.0–1.2)
CO2: 20 mmol/L (ref 20–29)
Calcium: 9.3 mg/dL (ref 8.7–10.2)
Chloride: 99 mmol/L (ref 96–106)
Creatinine, Ser: 0.87 mg/dL (ref 0.57–1.00)
GFR calc Af Amer: 92 mL/min/{1.73_m2} (ref >59)
GFR calc non Af Amer: 80 mL/min/{1.73_m2} (ref >59)
Globulin, Total: 2.9 g/dL (ref 1.5–4.5)
Glucose: 105 mg/dL — ABNORMAL HIGH (ref 65–99)
Potassium: 4.2 mmol/L (ref 3.5–5.2)
Sodium: 140 mmol/L (ref 134–144)
Total Protein: 7.1 g/dL (ref 6.0–8.5)

## 2020-01-27 LAB — D-DIMER, QUANTITATIVE: D-DIMER: 0.53 mg/L FEU — ABNORMAL HIGH (ref 0.00–0.49)

## 2020-01-27 LAB — FERRITIN: Ferritin: 20 ng/mL (ref 15–150)

## 2020-01-27 LAB — C-REACTIVE PROTEIN: CRP: 19 mg/L — ABNORMAL HIGH (ref 0–10)

## 2020-01-28 ENCOUNTER — Ambulatory Visit: Payer: BC Managed Care – PPO | Admitting: Internal Medicine

## 2020-01-28 ENCOUNTER — Other Ambulatory Visit: Payer: Self-pay

## 2020-01-28 ENCOUNTER — Encounter: Payer: Self-pay | Admitting: Internal Medicine

## 2020-01-28 ENCOUNTER — Other Ambulatory Visit (INDEPENDENT_AMBULATORY_CARE_PROVIDER_SITE_OTHER): Payer: BC Managed Care – PPO

## 2020-01-28 ENCOUNTER — Ambulatory Visit (INDEPENDENT_AMBULATORY_CARE_PROVIDER_SITE_OTHER): Payer: BC Managed Care – PPO

## 2020-01-28 VITALS — BP 126/78 | HR 82 | Temp 98.0°F | Ht 61.0 in | Wt 199.4 lb

## 2020-01-28 DIAGNOSIS — J1282 Pneumonia due to coronavirus disease 2019: Secondary | ICD-10-CM | POA: Diagnosis not present

## 2020-01-28 DIAGNOSIS — R0600 Dyspnea, unspecified: Secondary | ICD-10-CM

## 2020-01-28 DIAGNOSIS — U071 COVID-19: Secondary | ICD-10-CM

## 2020-01-28 DIAGNOSIS — R5383 Other fatigue: Secondary | ICD-10-CM

## 2020-01-28 DIAGNOSIS — R0609 Other forms of dyspnea: Secondary | ICD-10-CM

## 2020-01-28 DIAGNOSIS — Z8616 Personal history of COVID-19: Secondary | ICD-10-CM

## 2020-01-28 DIAGNOSIS — R002 Palpitations: Secondary | ICD-10-CM

## 2020-01-28 DIAGNOSIS — R7989 Other specified abnormal findings of blood chemistry: Secondary | ICD-10-CM

## 2020-01-28 LAB — TSH: TSH: 0.71 u[IU]/mL (ref 0.35–4.50)

## 2020-01-28 NOTE — Patient Instructions (Signed)
ICD-10-CM   1. History of 2019 novel coronavirus disease (COVID-19)  Z86.16   2. Other fatigue  R53.83   3. DOE (dyspnea on exertion)  R06.00   4. Positive D dimer  R79.89   5. Palpitations  R00.2     I think you are dealing with classic post Covid symptoms. 90% of patients will improve and resolve almost all of this within 3 months Glad you are on the upswing and it is still early in the course -so you should not lose heart Nevertheless it is a good idea to get some testing done to make sure were not missing anything  Plan -Order 2D echocardiogram [last 09/23/2016] -Please make an appoint with Dr. Loletha Grayer to ensure your tachycardia/palpitations is not abnormal -Check Doppler lower extremities for blood clot because a positive D-dimer -Check thyroid function test TSH and free T4 because of fatigue -Check chest x-ray 2 view today -we will call with the results -Simple walking desaturation test in the office today -Do overnight pulse oximetry test on room air -we will call with the results -For now continue Trelegy  Follow-up -Less than 2 weeks to review the results -this can be a telephone visit with nurse practitioner  -At 8 to 12-week time point we will do a full pulmonary function test to decide about further continuation versus discontinuation of Trelegy

## 2020-01-28 NOTE — Addendum Note (Signed)
Addended by: Satira Sark D on: 01/28/2020 12:30 PM   Modules accepted: Orders

## 2020-01-28 NOTE — Progress Notes (Signed)
Please let patient know CXR showed largely resolved covid pneumonia. Minimal residual opacities. Recommend repeat CXR in 6 weeks to ensure resolution, if still presents recommend CT chest. Covid can cause some scarring/fibrosis of the lung.   Please order CXR follow-up

## 2020-01-28 NOTE — Progress Notes (Signed)
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 first noted symptoms of sob around 2004 while working at [employer] x 2001 - 2010 stopped working p admitted with dx of pna at [facility] x one week ( roof at school room fell in and swept it up and a week prior to admit and was told black mold caused her infection though not seen by pulmonary medicine, just Dr Rockne Menghini per pt) and only   25% better at discharge never back to aerobic levels  at wt 170 at d/c   eval by [name] initially with poor pfts with allergy to dust/ dogs/ per pt rec saba before ex and symbicort as maint but 0% better rec allergy shots did not agree to do them but continued the inhalers s benefit then abruptly worse in 2010 p tonsilectomy > dx gastroparesis/ viral hepatitis > referred to [facility] by [name] > dx was bacterial overgrowth c/w  c diff  > fecal transplant at [facility] [place] sees with ongoing nausea and vomiting and told never to take ppi again  referred to pulmonary clinic 07/02/2014 by Workers comp    History of Present Illness  07/02/2014 1st [facility] office visit/ Writer Complaint  Patient presents with  . Pulmonary Consult    Worker's Comp Case. Pt c/o SOB since 2005. She states that she gets SOB with walking up stairs, walking fast or "any kind of exercise". 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   last known mold exposure 2010 Breathing a little better in cooler weather anything more than walking flat room to room causes sob, can't do one flight of steps  rx advair but did not use am of ov Uses saba a little better if uses right before ex Some cough with activity but not with meals and not noct- actually sleeps fine Last vomited sev months prior to OV   rec  Weight loss/ reconditioning   11/11/2019 consultation/[name] re:  Sob/ cough on bcp's with h/o gastroparesis / did not bring  inhalers/ meds  Chief Complaint  Patient presents with  . Consult    Was exposed to a lot of mold at work.   Dyspnea: walks neighborhood slow pace x 10 min o/w chest tight/ cough/ sob  Cough: dry/ nose runs / sore throat/ esp fall /spring  Sleeping: no resp symptoms 30-45 degrees wedge  SABA use: can def tell a difference of use inhaler when  walks but only a smidge better if ex p saba  02: none    No obvious day to day or daytime variability or assoc excess/ purulent sputum or mucus plugs or hemoptysis or cp or chest tightness, subjective wheeze or overt sinus or hb symptoms.  HPI [name] 49 y.o. -     OV 01/28/2020  Subjective:  Patient ID: [name], female , DOB: 02-Feb-1971 , age 49 y.o. , MRN: [id number] , ADDRESS: [place] [id number]   01/28/2020 -   Chief Complaint  Patient presents with  . Follow-up    pt is on trelegy and rescue inhaler.pt states 02 fluctuates.pt states using rescue inhler 1-2 timew a week.pt states coughing up clear mucus   Former patient of [name].  At that time for x-rays and his asthma with albuterol as needed.  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  Her mother [name] sees me at the [facility].  She was diagnosed on December 20, 2019 with COVID-19 because she had not gotten around to getting the vaccine.  She was then admitted for 5 days.  Review of the records indicate that she got remdesivir and dexamethasone.  Did not get Tocilizumab.  She was discharged on night oxygen since then she is weaned herself off that.  Chest x-ray at admission showed bilateral infiltrates consistent with Covid.  No further chest x-ray.  Reviewed below.  She tells me overall she is 50% better but she is not fully back to baseline.  She is dealing with brain fog, respiratory symptoms, fatigue, cough, still having some diarrhea.  She was started on Trelegy by primary care physician and  this is helping.  She visited the post Covid clinic and reviewed the labs the D-dimer slightly high hemoglobin is in the 12 range but otherwise labs are satisfactory but I do not see a thyroid function testing.  She is also having early morning palpitations.  She follows with Dr. Loletha Rush in cardiology for that.  She does not have a follow-up appointment.  Last echo was 2018.  ROS - per HPI  Results for Beth, Rush (MRN [id number]) as of 01/28/2020 11:30  Ref. Range 01/23/2020 12:02  Creatinine Latest Ref Range: 0.57 - 1.00 mg/dL 0.87   Results for Beth, Rush (MRN [id number]) as of 01/28/2020 11:30  Ref. Range 01/23/2020 12:02  CRP Latest Ref Range: 0 - 10 mg/L 19 (H)  Results for Beth, Rush (MRN [id number]) as of 01/28/2020 11:30  Ref. Range 01/23/2020 12:02  Hemoglobin Latest Ref Range: 11.1 - 15.9 g/dL 12.7  Results for Beth, Rush (MRN [id number]) as of 01/28/2020 11:30  Ref. Range 01/23/2020 12:02  D-dimer Latest Ref Range: 0.00 - 0.49 mg/L FEU 0.53 (H)    IMPRESSION: Chest x-ray at admission personally visualized Bilateral peripheral pulmonary infiltrates, left greater than right, consistent with COVID-19 pneumonia given history.   Electronically Signed   By: [name] M.D   On: 12/26/2019 15:29   Last echocardiogram 2018 =  -Normal   has a past medical history of Anxiety, Arrhythmia, Asthma, Clostridium difficile infection (2014), Epstein Barr virus infection, Fibromyalgia, GERD (gastroesophageal reflux disease), Hypertension, Hypothyroidism, Immune deficiency disorder ([place]), Lyme disease, Mold contact confirmed, Mononucleosis (5-10), Ovarian cyst, PONV (postoperative nausea and vomiting), Shortness of breath, Systolic murmur, and Thyroid disease.   reports that she has never smoked. She has never used smokeless tobacco.  Past Surgical History:  Procedure Laterality Date  . BLADDER SURGERY    . CHOLECYSTECTOMY    . FECAL TRANSPLANT    . Groin cyst    .  LAPAROSCOPY Right 06/12/2013   Procedure: LAPAROSCOPY OPERATIVE  fulgeration of endometriosis, right salpingooophorectomy;  Surgeon: Terrance Mass, MD;  Location: [facility];  Service: Gynecology;  Laterality: Right;  . Nose cyst    . OOPHORECTOMY Right 06/12/2013   Procedure: OOPHORECTOMY;POSS RIGHT SALPING OOPHORECTOMY;  Surgeon: Terrance Mass, MD;  Location: [facility];  Service: Gynecology;  Laterality: Right;  . PELVIC LAPAROSCOPY     DL Ovarian cystectomy-Left  . TONSILLECTOMY  2009    Allergies  Allergen Reactions  . Allegra [Fexofenadine Hcl] Shortness Of Breath and Rash  . Fexofenadine Shortness Of Breath, Rash and Other (See Comments)    Shakes, can't sleep, trouble breathing  .  Oseltamivir Other (See Comments)    Can't breathe, sick to stomach  . Tamiflu [Oseltamivir Phosphate] Itching, Nausea And Vomiting and Other (See Comments)    Can't breathe, sick to stomach  . Vancomycin Other (See Comments)    Red man's Syndrome.  . Diphenhydramine Other (See Comments)    Shakes, can't sleep, trouble breathing  . Latex Itching, Dermatitis and Rash  . Sulfamethoxazole Rash  . Aspirin Nausea And Vomiting  . Doxycycline   . Other     NUT ALLERGY  . Diphenhydramine Hcl Palpitations and Rash  . Morphine Other (See Comments)    REACTION: insomnia  . Oseltamivir Phosphate Itching and Nausea And Vomiting  . Penicillins Itching and Swelling    Can tolerate Amoxicillin OK.  . Sulfonamide Derivatives Rash    Immunization History  Administered Date(s) Administered  . Influenza,inj,Quad PF,6+ Mos 06/16/2019    Family History  Problem Relation Age of Onset  . Hypertension Mother   . Diabetes Mother   . Pulmonary embolism Mother        x2  . Breast cancer Maternal Aunt        Age 34  . Pancreatic cancer Maternal Grandmother   . Heart disease Maternal Grandfather   . Breast cancer Paternal Grandmother        Age 76  . Asthma Paternal Grandmother      Current Outpatient  Medications:  .  albuterol (PROAIR HFA) 108 (90 Base) MCG/ACT inhaler, Inhale 2 puffs into the lungs every 6 (six) hours as needed for wheezing or shortness of breath., Disp: 18 g, Rfl: 3 .  ALPRAZolam (XANAX XR) 0.5 MG 24 hr tablet, Take 0.5 mg by mouth in the morning and at bedtime., Disp: , Rfl:  .  amLODipine (NORVASC) 2.5 MG tablet, Take 1 tablet (2.5 mg total) by mouth daily. NEED OV., Disp: 90 tablet, Rfl: 3 .  baclofen (LIORESAL) 20 MG tablet, Take 20 mg by mouth as needed., Disp: , Rfl:  .  bisoprolol (ZEBETA) 10 MG tablet, Take 10 mg by mouth daily., Disp: , Rfl:  .  Buprenorphine HCl (BELBUCA) 450 MCG FILM, Place 1 Film inside cheek in the morning and at bedtime., Disp: , Rfl:  .  cetirizine (ZYRTEC) 10 MG tablet, Take 10 mg by mouth daily. , Disp: , Rfl:  .  Cholecalciferol (VITAMIN D PO), Take 1 tablet by mouth daily. , Disp: , Rfl:  .  citalopram (CELEXA) 40 MG tablet, Take 40 mg by mouth daily., Disp: , Rfl:  .  fluticasone (FLONASE) 50 MCG/ACT nasal spray, Place 1 spray into both nostrils daily., Disp: , Rfl:  .  hydrOXYzine (ATARAX/VISTARIL) 25 MG tablet, Take 25 mg by mouth as needed for anxiety. , Disp: , Rfl:  .  Hyoscyamine Sulfate SL 0.125 MG SUBL, Take 0.125 mg by mouth every 4 (four) hours as needed for cramping., Disp: , Rfl:  .  Melatonin 10 MG TABS, Take 10 mg by mouth at bedtime. , Disp: , Rfl:  .  methocarbamol (ROBAXIN) 500 MG tablet, Take 1 tablet (500 mg total) by mouth 3 (three) times daily., Disp: 40 tablet, Rfl: 1 .  Probiotic Product (ALIGN PO), Take 1 capsule by mouth daily. , Disp: , Rfl:  .  prochlorperazine (COMPAZINE) 10 MG tablet, Take 10 mg by mouth every 6 (six) hours as needed for nausea or vomiting. , Disp: , Rfl:  .  promethazine (PHENERGAN) 25 MG tablet, Take 25 mg by mouth every 6 (six) hours  as needed for nausea or vomiting. , Disp: , Rfl:  .  rOPINIRole (REQUIP) 2 MG tablet, Take 2 mg by mouth at bedtime. , Disp: , Rfl:  .  scopolamine  (TRANSDERM-SCOP) 1 MG/3DAYS, Place 1 mg onto the skin every three (3) days as needed. For 30 days, Disp: , Rfl:  .  telmisartan (MICARDIS) 80 MG tablet, Take 80 mg by mouth daily., Disp: , Rfl:  .  thyroid (ARMOUR) 90 MG tablet, Take 90 mg by mouth daily. NP Thyroid, Disp: , Rfl:  .  TRELEGY ELLIPTA 200-62.5-25 MCG/INH AEPB, , Disp: , Rfl:  .  vitamin B-12 (CYANOCOBALAMIN) 500 MCG tablet, Take 500 mcg by mouth daily., Disp: , Rfl:  .  busPIRone (BUSPAR) 15 MG tablet, TAKE 1/3 TAB TWICE DAILY X1 WEEK, THEN 2/3 TAB TWICE DAILY X1 WEEK, THEN 1 TAB TWICE A DAY (Patient not taking: Reported on 01/28/2020), Disp: 180 tablet, Rfl: 0 .  colesevelam (WELCHOL) 625 MG tablet, Take 625 mg by mouth 2 (two) times daily with a meal., Disp: , Rfl:  .  norethindrone-ethinyl estradiol (JUNEL FE 1/20) 1-20 MG-MCG tablet, Take 1 tablet by mouth daily. Continuous use for Dysmenorrhea (Patient not taking: Reported on 01/19/2020), Disp: 4 Package, Rfl: 4      Objective:   Vitals:   01/28/20 1125  BP: 126/78  Pulse: 82  Temp: 98 F (36.7 C)  TempSrc: Oral  SpO2: 94%  Weight: 199 lb 6.4 oz (90.4 kg)  Height: 5\' 1"  (1.549 m)    Estimated body mass index is 37.68 kg/m as calculated from the following:   Height as of this encounter: 5\' 1"  (1.549 m).   Weight as of this encounter: 199 lb 6.4 oz (90.4 kg).  @WEIGHTCHANGE @  [employer]   01/28/20 1125  Weight: 199 lb 6.4 oz (90.4 kg)     Physical Exam  General Appearance:    Alert, cooperative, no distress, appears stated age - yes , Deconditioned looking - mild ye , OBESE  - yes, Sitting on Wheelchair -  no  Head:    Normocephalic, without obvious abnormality, atraumatic  Eyes:    PERRL, conjunctiva/corneas clear,  Ears:    Normal TM's and external ear canals, both ears  Nose:   Nares normal, septum midline, mucosa normal, no drainage    or sinus tenderness. OXYGEN ON  - no . Patient is @ ra   Throat:   Lips, mucosa, and tongue normal; teeth and gums  normal. Cyanosis on lips - no  Neck:   Supple, symmetrical, trachea midline, no adenopathy;    thyroid:  no enlargement/tenderness/nodules; no carotid   bruit or JVD  Back:     Symmetric, no curvature, ROM normal, no CVA tenderness  Lungs:     Distress - no , Wheeze no, Barrell Chest - no, Purse lip breathing - no, Crackles - no   Chest Wall:    No tenderness or deformity.    Heart:    Regular rate and rhythm, S1 and S2 normal, no rub   or gallop, Murmur - no  Breast Exam:    NOT DONE  Abdomen:     Soft, non-tender, bowel sounds active all four quadrants,    no masses, no organomegaly. Visceral obesity - yes  Genitalia:   NOT DONE  Rectal:   NOT DONE  Extremities:   Extremities - normal, Has Cane - yes, Clubbing - no, Edema - no  Pulses:   2+ and symmetric all extremities  Skin:   Stigmata of Connective Tissue Disease - no  Lymph nodes:   Cervical, supraclavicular, and axillary nodes normal  Psychiatric:  Neurologic:   Pleasant - yes, Anxious - no, Flat affect - no  CAm-ICU - neg, Alert and Oriented x 3 - yes, Moves all 4s - yes, Speech - normal, Cognition - intact           Assessment:       ICD-10-CM   1. History of 2019 novel coronavirus disease (COVID-19)  Z86.16   2. Other fatigue  R53.83   3. DOE (dyspnea on exertion)  R06.00   4. Positive D dimer  R79.89   5. Palpitations  R00.2        Plan:     Patient Instructions     ICD-10-CM   1. History of 2019 novel coronavirus disease (COVID-19)  Z86.16   2. Other fatigue  R53.83   3. DOE (dyspnea on exertion)  R06.00   4. Positive D dimer  R79.89   5. Palpitations  R00.2     I think you are dealing with classic post Covid symptoms. 90% of patients will improve and resolve almost all of this within 3 months Glad you are on the upswing and it is still early in the course -so you should not lose heart Nevertheless it is a good idea to get some testing done to make sure were not missing anything  Plan -Order 2D  echocardiogram [last 09/23/2016] -Please make an appoint with Dr. Loletha Rush to ensure your tachycardia/palpitations is not abnormal -Check Doppler lower extremities for blood clot because a positive D-dimer -Check thyroid function test TSH and free T4 because of fatigue -Check chest x-ray 2 view today -we will call with the results -Simple walking desaturation test in the office today -Do overnight pulse oximetry test on room air -we will call with the results -For now continue Trelegy  Follow-up -Less than 2 weeks to review the results -this can be a telephone visit with nurse practitioner  -At 8 to 12-week time point we will do a full pulmonary function test to decide about further continuation versus discontinuation of Trelegy       SIGNATURE    Dr. Brand Males, M.D., F.C.C.P,  Pulmonary and Critical Care Medicine Staff Physician, [place] Director - Interstitial Lung Disease  Program  Pulmonary [place] [facility] [place], [place], [address]  Pager: [phone], If no answer or between  15:00h - 7:00h: call [phone] Telephone: [phone]  11:51 AM 01/28/2020

## 2020-01-28 NOTE — Progress Notes (Signed)
Sorry sent this to triage, see results and please call patient

## 2020-01-29 LAB — T4: T4, Total: 4.2 ug/dL — ABNORMAL LOW (ref 5.1–11.9)

## 2020-01-30 ENCOUNTER — Other Ambulatory Visit: Payer: Self-pay | Admitting: Primary Care

## 2020-01-30 DIAGNOSIS — U071 COVID-19: Secondary | ICD-10-CM

## 2020-01-30 NOTE — Progress Notes (Signed)
cxr 

## 2020-01-30 NOTE — Progress Notes (Signed)
Patient identification verified. Results of recent CXR reviewed.  Per Derl Barrow NP, please let patient know CXR showed largely resolved covid pneumonia. Minimal residual opacities. Recommend repeat CXR in 6 weeks to ensure resolution, if still presents recommend CT chest. Covid can cause some scarring/fibrosis of the lung. Please order CXR follow-up.  Patient verbalized understanding of results and 6 week CXR ordered.

## 2020-02-03 ENCOUNTER — Other Ambulatory Visit: Payer: Self-pay

## 2020-02-04 ENCOUNTER — Encounter: Payer: Self-pay | Admitting: Obstetrics & Gynecology

## 2020-02-04 ENCOUNTER — Ambulatory Visit: Payer: BC Managed Care – PPO | Admitting: Obstetrics & Gynecology

## 2020-02-04 VITALS — BP 130/84

## 2020-02-04 DIAGNOSIS — Z8742 Personal history of other diseases of the female genital tract: Secondary | ICD-10-CM

## 2020-02-04 DIAGNOSIS — B372 Candidiasis of skin and nail: Secondary | ICD-10-CM | POA: Diagnosis not present

## 2020-02-04 MED ORDER — NYSTATIN 100000 UNIT/GM EX POWD
1.0000 | Freq: Two times a day (BID) | CUTANEOUS | 3 refills | Status: DC
Start: 2020-02-04 — End: 2020-06-29

## 2020-02-04 MED ORDER — NORETHINDRONE 0.35 MG PO TABS: 1.0000 | ORAL_TABLET | Freq: Every day | ORAL | 4 refills | Status: DC

## 2020-02-04 NOTE — Progress Notes (Signed)
    [name] 1971/06/24 [id number]        49 y.o.  G0P0   RP: Itching of the breast bilaterally  HPI: Itching of the breast bilaterally.  No rash seen.  No breast lump or nipple discharge.  Last mammogram October 2019.   OB History  Gravida Para Term Preterm AB Living  0            SAB TAB Ectopic Multiple Live Births               Past medical history,surgical history, problem list, medications, allergies, family history and social history were all reviewed and documented in the EPIC chart.   Directed ROS with pertinent positives and negatives documented in the history of present illness/assessment and plan.  Exam:  Vitals:   02/04/20 1514  BP: 130/84   General appearance:  Normal  Breast exam:  Normal.   Assessment/Plan:  49 y.o. G0P0   1. Yeast infection of the skin Itching under the breast bilaterally.  Probable yeast infection of the skin.  Will treat with nystatin powder.  Usage reviewed and prescription sent to pharmacy.  Patient will schedule bilateral screening mammogram through our office now that her breast exam is done and normal.  2. History of endometriosis Decision to control symptoms with the progestin only birth control pill.  No contraindications.  Risks benefits and usage reviewed with patient.  Prescription sent to pharmacy.  Other orders - nystatin (MYCOSTATIN/NYSTOP) powder; Apply 1 application topically 2 (two) times daily. - norethindrone (MICRONOR) 0.35 MG tablet; Take 1 tablet (0.35 mg total) by mouth daily.  Princess Bruins MD, 3:40 PM 02/04/2020

## 2020-02-05 ENCOUNTER — Telehealth: Payer: Self-pay | Admitting: *Deleted

## 2020-02-05 DIAGNOSIS — B372 Candidiasis of skin and nail: Secondary | ICD-10-CM

## 2020-02-05 DIAGNOSIS — Z1231 Encounter for screening mammogram for malignant neoplasm of breast: Secondary | ICD-10-CM

## 2020-02-05 NOTE — Telephone Encounter (Signed)
Order placed at breast center they will call to schedule.

## 2020-02-05 NOTE — Telephone Encounter (Signed)
-----   Message from Princess Bruins, MD sent at 02/04/2020  3:47 PM EDT ----- Regarding: Schedule bilateral screening mammo Bilateral breast itching.  Probably yeast, treating.  Normal breast exam today.  Please schedule bilateral screening mammo for patient (3D)

## 2020-02-10 ENCOUNTER — Encounter: Payer: Self-pay | Admitting: Obstetrics & Gynecology

## 2020-02-10 NOTE — Telephone Encounter (Signed)
Appointment scheduled on 02/19/20

## 2020-02-10 NOTE — Patient Instructions (Addendum)
1. Yeast infection of the skin Itching under the breast bilaterally.  Probable yeast infection of the skin.  Will treat with nystatin powder.  Usage reviewed and prescription sent to pharmacy.  Patient will schedule bilateral screening mammogram through our office now that her breast exam is done and normal.  2. History of endometriosis Decision to control symptoms with the progestin only birth control pill.  No contraindications.  Risks benefits and usage reviewed with patient.  Prescription sent to pharmacy.  Other orders - nystatin (MYCOSTATIN/NYSTOP) powder; Apply 1 application topically 2 (two) times daily. - norethindrone (MICRONOR) 0.35 MG tablet; Take 1 tablet (0.35 mg total) by mouth daily.  [name], it was a pleasure seeing you today!

## 2020-02-12 DIAGNOSIS — G894 Chronic pain syndrome: Secondary | ICD-10-CM | POA: Diagnosis not present

## 2020-02-12 DIAGNOSIS — F1190 Opioid use, unspecified, uncomplicated: Secondary | ICD-10-CM | POA: Diagnosis not present

## 2020-02-12 DIAGNOSIS — Z5181 Encounter for therapeutic drug level monitoring: Secondary | ICD-10-CM | POA: Diagnosis not present

## 2020-02-12 DIAGNOSIS — Z79899 Other long term (current) drug therapy: Secondary | ICD-10-CM | POA: Diagnosis not present

## 2020-02-12 DIAGNOSIS — R1084 Generalized abdominal pain: Secondary | ICD-10-CM | POA: Diagnosis not present

## 2020-02-12 DIAGNOSIS — M542 Cervicalgia: Secondary | ICD-10-CM | POA: Diagnosis not present

## 2020-02-16 ENCOUNTER — Ambulatory Visit: Payer: BC Managed Care – PPO | Admitting: Adult Health

## 2020-02-16 ENCOUNTER — Other Ambulatory Visit: Payer: Self-pay

## 2020-02-16 ENCOUNTER — Encounter: Payer: Self-pay | Admitting: Adult Health

## 2020-02-16 VITALS — BP 132/77 | HR 73 | Temp 97.2°F | Ht 60.0 in | Wt 202.6 lb

## 2020-02-16 DIAGNOSIS — R0989 Other specified symptoms and signs involving the circulatory and respiratory systems: Secondary | ICD-10-CM

## 2020-02-16 DIAGNOSIS — U071 COVID-19: Secondary | ICD-10-CM

## 2020-02-16 DIAGNOSIS — R000 Tachycardia, unspecified: Secondary | ICD-10-CM

## 2020-02-16 DIAGNOSIS — J1282 Pneumonia due to coronavirus disease 2019: Secondary | ICD-10-CM

## 2020-02-16 NOTE — Progress Notes (Signed)
Patient rescheduled, tests ordered last ov were not done, all tests were reordered Will reschedule today's visit .  Patient slowly improving, not tolerated Trelegy .  Rexene Edison NP

## 2020-02-17 ENCOUNTER — Other Ambulatory Visit: Payer: Self-pay | Admitting: Adult Health

## 2020-02-17 DIAGNOSIS — M7989 Other specified soft tissue disorders: Secondary | ICD-10-CM

## 2020-02-19 ENCOUNTER — Other Ambulatory Visit: Payer: Self-pay

## 2020-02-19 ENCOUNTER — Ambulatory Visit
Admission: RE | Admit: 2020-02-19 | Discharge: 2020-02-19 | Disposition: A | Discharge status: Home / Self Care | Payer: BC Managed Care – PPO | Source: Ambulatory Visit | Attending: Obstetrics & Gynecology | Admitting: Obstetrics & Gynecology

## 2020-02-19 ENCOUNTER — Ambulatory Visit: Payer: BC Managed Care – PPO

## 2020-02-19 DIAGNOSIS — Z1231 Encounter for screening mammogram for malignant neoplasm of breast: Secondary | ICD-10-CM

## 2020-02-20 ENCOUNTER — Ambulatory Visit (INDEPENDENT_AMBULATORY_CARE_PROVIDER_SITE_OTHER): Payer: BC Managed Care – PPO

## 2020-02-20 DIAGNOSIS — M7989 Other specified soft tissue disorders: Secondary | ICD-10-CM | POA: Diagnosis not present

## 2020-02-23 ENCOUNTER — Ambulatory Visit: Payer: BC Managed Care – PPO | Admitting: Psychiatry

## 2020-02-25 DIAGNOSIS — M2550 Pain in unspecified joint: Secondary | ICD-10-CM | POA: Diagnosis not present

## 2020-02-25 DIAGNOSIS — E039 Hypothyroidism, unspecified: Secondary | ICD-10-CM | POA: Diagnosis not present

## 2020-02-25 DIAGNOSIS — D649 Anemia, unspecified: Secondary | ICD-10-CM | POA: Diagnosis not present

## 2020-02-25 DIAGNOSIS — E559 Vitamin D deficiency, unspecified: Secondary | ICD-10-CM | POA: Diagnosis not present

## 2020-02-26 ENCOUNTER — Ambulatory Visit (INDEPENDENT_AMBULATORY_CARE_PROVIDER_SITE_OTHER): Payer: BC Managed Care – PPO

## 2020-02-26 ENCOUNTER — Other Ambulatory Visit: Payer: Self-pay

## 2020-02-26 DIAGNOSIS — R000 Tachycardia, unspecified: Secondary | ICD-10-CM | POA: Diagnosis not present

## 2020-02-26 DIAGNOSIS — R0602 Shortness of breath: Secondary | ICD-10-CM

## 2020-02-26 MED ORDER — PERFLUTREN LIPID MICROSPHERE
1.0000 mL | INTRAVENOUS | Status: AC | PRN
  Administered 2020-02-26: 2 mL via INTRAVENOUS

## 2020-03-04 ENCOUNTER — Other Ambulatory Visit: Payer: Self-pay

## 2020-03-04 ENCOUNTER — Ambulatory Visit: Payer: BC Managed Care – PPO | Admitting: Neurology

## 2020-03-04 ENCOUNTER — Encounter: Payer: Self-pay | Admitting: Neurology

## 2020-03-04 VITALS — BP 116/74 | HR 59 | Ht 60.0 in | Wt 201.0 lb

## 2020-03-04 DIAGNOSIS — R4189 Other symptoms and signs involving cognitive functions and awareness: Secondary | ICD-10-CM

## 2020-03-04 DIAGNOSIS — F488 Other specified nonpsychotic mental disorders: Secondary | ICD-10-CM

## 2020-03-04 NOTE — Progress Notes (Signed)
HISTORICAL  [name], is a 49 year old female seen in request by her primary care nurse practitioner Lazaro Arms for evaluation of brain fog following her COVID-19 infection, initial evaluation was on March 04, 2020  I reviewed and summarized the referring note. 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 difficile, required fecal transplant at [place], she continues to struggle with frequent GI symptoms, carried the diagnosis of gastric paralysis, irritable bowel syndromes. Also reported a history of Lyme disease, was previously treated with 6 months history PICC line,   antibiotics at [place], presented with severe headaches, rash all over, achy all over, extreme fatigue,  She is under pain management due to her previous chronic medical history, seen [facility] Dr. Lacinda Axon, Anxiety, has been treated with Celexa 40 mg for many years Fibromyalgia  She was diagnosed with Covid on December 20, 2019, reported high fever 102, require hospital admission on April 22, received a monoclonal antibody infusion, complains of nausea, vomiting, diarrhea, require O2 supplement, was also treated with IV remdesivir dexamethasone for 5 days, showed improvement in O2 saturation, was able to wean off 2 L nasal cannula to room air on ambulation, her GI symptoms has improved as well  However since COVID-19 infection in April 2021, she continues to struggle with generalized fatigue, memory loss, brain fog, when she gets into her room, she forgot why she was there, making lists on daily activity, continue to struggle with GI symptoms, she also complains of frequent headaches, 3-4 times each week,   I reviewed laboratory evaluations, normal TSH, Normal B12  324, normal homocystine 8.7, folic acid 7.0, iron saturation was mildly decreased at 13, copper level was within normal limit 1.44, rheumatoid factor was negative, CCP antibody IgG/IgA was within normal limit, TSH was normal 0.53, zinc level was normal at 1387, reverse T3 was mildly decreased 8.3  Vitamin D level was normal at 32, negative ANA, ferritin was on 15, lower limit of normal range, anti-CCP antibody was negative, and TSH IgG antibody was negative, normal ESR 18, thyroid peroxidase antibody, thyroglobulin antibody was normal, free T4 was mildly decreased to 0.62, free T3 was high at 4.8  CMP showed normal creatinine  0.87, CBC, hemoglobin of 12.7  REVIEW OF SYSTEMS: Full 14 system review of systems performed and notable only for as above All other review of systems were negative.  ALLERGIES: Allergies  Allergen Reactions  . Allegra [Fexofenadine Hcl] Shortness Of Breath and Rash  . Fexofenadine Shortness Of Breath, Rash and Other (See Comments)    Shakes, can't sleep, trouble breathing  . Oseltamivir Other (See Comments)    Can't breathe, sick to stomach  . Tamiflu [Oseltamivir Phosphate] Itching, Nausea And Vomiting and Other (See Comments)    Can't breathe, sick to stomach  . Vancomycin Other (See Comments)    Red man's Syndrome.  . Diphenhydramine Other (See Comments)    Shakes, can't sleep, trouble breathing  . Latex Itching, Dermatitis and Rash  . Sulfamethoxazole Rash  . Aspirin Nausea And Vomiting  . Doxycycline   . Other     NUT ALLERGY  . Diphenhydramine Hcl Palpitations and Rash  . Morphine Other (See Comments)    REACTION: insomnia  . Oseltamivir Phosphate Itching and Nausea And Vomiting  .  Penicillins Itching and Swelling    Can tolerate Amoxicillin OK.  . Sulfonamide Derivatives Rash    HOME MEDICATIONS: Current Outpatient Medications  Medication Sig Dispense Refill  . albuterol (PROAIR HFA) 108 (90 Base) MCG/ACT inhaler Inhale 2 puffs into the lungs  every 6 (six) hours as needed for wheezing or shortness of breath. 18 g 3  . ALPRAZolam (XANAX XR) 0.5 MG 24 hr tablet Take 0.5 mg by mouth in the morning and at bedtime.    Marland Kitchen amLODipine (NORVASC) 2.5 MG tablet Take 1 tablet (2.5 mg total) by mouth daily. NEED OV. 90 tablet 3  . baclofen (LIORESAL) 20 MG tablet Take 20 mg by mouth as needed.    . bisoprolol (ZEBETA) 10 MG tablet Take 10 mg by mouth daily.    . Buprenorphine HCl (BELBUCA) 450 MCG FILM Place 1 Film inside cheek in the morning and at bedtime.    . busPIRone (BUSPAR) 15 MG tablet TAKE 1/3 TAB TWICE DAILY X1 WEEK, THEN 2/3 TAB TWICE DAILY X1 WEEK, THEN 1 TAB TWICE A DAY 180 tablet 0  . cetirizine (ZYRTEC) 10 MG tablet Take 10 mg by mouth daily.     . Cholecalciferol (VITAMIN D PO) Take 1 tablet by mouth daily.     . citalopram (CELEXA) 40 MG tablet Take 40 mg by mouth daily.    . colesevelam (WELCHOL) 625 MG tablet Take 625 mg by mouth as needed.     . fluticasone (FLONASE) 50 MCG/ACT nasal spray Place 1 spray into both nostrils daily.    . hydrOXYzine (ATARAX/VISTARIL) 25 MG tablet Take 25 mg by mouth as needed for anxiety.     . Hyoscyamine Sulfate SL 0.125 MG SUBL Take 0.125 mg by mouth every 4 (four) hours as needed for cramping.    Marland Kitchen ibuprofen (ADVIL) 200 MG tablet Take by mouth.    . Ibuprofen-Famotidine (DUEXIS) 800-26.6 MG TABS Take by mouth.    . Melatonin 10 MG TABS Take 10 mg by mouth at bedtime.     . Multiple Vitamin (MULTI-VITAMIN) tablet Take by mouth.    . norethindrone (MICRONOR) 0.35 MG tablet Take 1 tablet (0.35 mg total) by mouth daily. 3 Package 4  . norethindrone-ethinyl estradiol (JUNEL FE 1/20) 1-20 MG-MCG tablet Take 1 tablet by mouth daily. Continuous use for Dysmenorrhea 4 Package 4  . nystatin (MYCOSTATIN/NYSTOP) powder Apply 1 application topically 2 (two) times daily. 15 g 3  . Probiotic Product (ALIGN PO) Take 1 capsule by mouth daily.     . prochlorperazine (COMPAZINE) 10 MG tablet Take 10 mg by mouth  every 6 (six) hours as needed for nausea or vomiting.     . promethazine (PHENERGAN) 25 MG tablet Take 25 mg by mouth every 6 (six) hours as needed for nausea or vomiting.     . ranitidine (ZANTAC) 150 MG tablet Take by mouth.    Marland Kitchen rOPINIRole (REQUIP) 2 MG tablet Take 2 mg by mouth at bedtime.     Marland Kitchen scopolamine (TRANSDERM-SCOP) 1 MG/3DAYS Place 1 mg onto the skin every three (3) days as needed. For 30 days    . thyroid (ARMOUR) 90 MG tablet Take 90 mg by mouth daily. NP Thyroid    . vitamin B-12 (CYANOCOBALAMIN) 500 MCG tablet Take 500 mcg by mouth daily.     No current facility-administered medications for this visit.    PAST MEDICAL HISTORY: Past Medical History:  Diagnosis Date  . Anxiety   . Arrhythmia  heart races when pt in pain  . Asthma   . Clostridium difficile infection 2014  . [place] Patient virus infection   . Fibromyalgia   . GERD (gastroesophageal reflux disease)   . History of COVID-19   . Hypertension   . Hypothyroidism   . Immune deficiency disorder ([place])   . Lyme disease   . Mold contact confirmed    Toxic Mold syndrome  . Mononucleosis 5-10  . Ovarian cyst    Serous Cystadenofibroma-Left ovary  . PONV (postoperative nausea and vomiting)   . Shortness of breath    on exertion  . Systolic murmur   . Thyroid disease     PAST SURGICAL HISTORY: Past Surgical History:  Procedure Laterality Date  . BLADDER SURGERY    . CHOLECYSTECTOMY    . FECAL TRANSPLANT    . Groin cyst    . LAPAROSCOPY Right 06/12/2013   Procedure: LAPAROSCOPY OPERATIVE  fulgeration of endometriosis, right salpingooophorectomy;  Surgeon: Terrance Mass, MD;  Location: [facility];  Service: Gynecology;  Laterality: Right;  . Nose cyst    . OOPHORECTOMY Right 06/12/2013   Procedure: OOPHORECTOMY;POSS RIGHT SALPING OOPHORECTOMY;  Surgeon: Terrance Mass, MD;  Location: [facility];  Service: Gynecology;  Laterality: Right;  . PELVIC LAPAROSCOPY     DL Ovarian cystectomy-Left  . TONSILLECTOMY   2009    FAMILY HISTORY: Family History  Problem Relation Age of Onset  . Hypertension Mother   . Diabetes Mother   . Pulmonary embolism Mother        x2  . Heart disease Mother   . Breast cancer Maternal Aunt        Age 46  . Pancreatic cancer Maternal Grandmother   . Heart disease Maternal Grandfather   . Breast cancer Paternal Grandmother        Age 31  . Asthma Paternal Grandmother   . Hypertension Father   . Thyroid disease Father     SOCIAL HISTORY: Social History   Socioeconomic History  . Marital status: Single    Spouse name: Not on file  . Number of children: 0  . Years of education: college  . Highest education level: Not on file  Occupational History  . Occupation: Pharmacist, hospital  Tobacco Use  . Smoking status: Never Smoker  . Smokeless tobacco: Never Used  Vaping Use  . Vaping Use: Never used  Substance and Sexual Activity  . Alcohol use: No    Alcohol/week: 0.0 standard drinks  . Drug use: No  . Sexual activity: Never    Birth control/protection: Pill  Other Topics Concern  . Not on file  Social History Narrative   Lives at home alone.   Right-handed.   Caffeine use: 1 cup per day.   Social Determinants of Health   Financial Resource Strain:   . Difficulty of Paying Living Expenses:   Food Insecurity:   . Worried About Charity fundraiser in the Last Year:   . Arboriculturist in the Last Year:   Transportation Needs:   . Film/video editor (Medical):   Marland Kitchen Lack of Transportation (Non-Medical):   Physical Activity:   . Days of Exercise per Week:   . Minutes of Exercise per Session:   Stress:   . Feeling of Stress :   Social Connections:   . Frequency of Communication with Friends and Family:   . Frequency of Social Gatherings with Friends and Family:   . Attends Religious Services:   .  Active Member of Clubs or Organizations:   . Attends Archivist Meetings:   Marland Kitchen Marital Status:   Intimate Partner Violence:   . Fear of Current  or Ex-Partner:   . Emotionally Abused:   Marland Kitchen Physically Abused:   . Sexually Abused:      PHYSICAL EXAM   Vitals:   03/04/20 0805  BP: 116/74  Pulse: (!) 59  Weight: 201 lb (91.2 kg)  Height: 5' (1.524 m)   Not recorded     Body mass index is 39.26 kg/m.  PHYSICAL EXAMNIATION:  Gen: NAD, conversant, well nourised, well groomed                     Cardiovascular: Regular rate rhythm, no peripheral edema, warm, nontender. Eyes: Conjunctivae clear without exudates or hemorrhage Neck: Supple, no carotid bruits. Pulmonary: Clear to auscultation bilaterally   NEUROLOGICAL EXAM:  MENTAL STATUS: Speech:    Speech is normal; fluent and spontaneous with normal comprehension.  Cognition:     Orientation to time, place and person     Normal recent and remote memory     Normal Attention span and concentration     Normal Language, naming, repeating,spontaneous speech     Fund of knowledge   CRANIAL NERVES: CN II: Visual fields are full to confrontation. Pupils are round equal and briskly reactive to light. CN III, IV, VI: extraocular movement are normal. No ptosis. CN V: Facial sensation is intact to light touch CN VII: Face is symmetric with normal eye closure  CN VIII: Hearing is normal to causal conversation. CN IX, X: Phonation is normal. CN XI: Head turning and shoulder shrug are intact  MOTOR: There is no pronator drift of out-stretched arms. Muscle bulk and tone are normal. Muscle strength is normal.  REFLEXES: Reflexes are 2+ and symmetric at the biceps, triceps, knees, and ankles. Plantar responses are flexor.  SENSORY: Intact to light touch, pinprick and vibratory sensation are intact in fingers and toes.  COORDINATION: There is no trunk or limb dysmetria noted.  GAIT/STANCE: Posture is normal. Gait is steady with normal steps, base, arm swing, and turning. Heel and toe walking are normal. Tandem gait is normal.  Romberg is absent.   DIAGNOSTIC DATA  (LABS, IMAGING, TESTING) - I reviewed patient records, labs, notes, testing and imaging myself where available.   ASSESSMENT AND PLAN  ANDILYN BETTCHER is a 49 y.o. female    Confusion, memory loss following her COVID-19 infection in April 2021  This happened in the setting of her chronic illness, mood disorder, polypharmacy treatment,  MRI of the brain, EEG to rule out treatable etiology  Above-mentioned factors likely play a role in her complaints as well    Marcial Pacas, M.D. Ph.D.  [facility] [address], [place], [place] [address] Ph: [phone] Fax: [phone]  CC:  [name], NP [place],  [place] [id number]  Deland Pretty, MD

## 2020-03-09 ENCOUNTER — Other Ambulatory Visit: Payer: Self-pay | Admitting: Neurology

## 2020-03-09 NOTE — Telephone Encounter (Signed)
unable to leave vmail the mail box is full  BCBS Auth: [id number] (exp. 03/09/20 to 09/04/20)

## 2020-03-11 DIAGNOSIS — M542 Cervicalgia: Secondary | ICD-10-CM | POA: Diagnosis not present

## 2020-03-11 DIAGNOSIS — R1084 Generalized abdominal pain: Secondary | ICD-10-CM | POA: Diagnosis not present

## 2020-03-11 DIAGNOSIS — G894 Chronic pain syndrome: Secondary | ICD-10-CM | POA: Diagnosis not present

## 2020-03-22 ENCOUNTER — Other Ambulatory Visit: Payer: Self-pay

## 2020-03-22 ENCOUNTER — Ambulatory Visit: Payer: BC Managed Care – PPO | Admitting: Nurse Practitioner

## 2020-03-22 DIAGNOSIS — Z8616 Personal history of COVID-19: Secondary | ICD-10-CM

## 2020-03-22 NOTE — Patient Instructions (Addendum)
History of COVID-19 Covid  Pneumonia:  Most recent chest xray showed improvement  Continue inhalers as per pulmonary  Keep follow up with Dr. Chase Caller - PFT ordered   Memory loss:  Please keep follow up with neurology - EEG and MRI ordered   Anxiety:  Please follow up with psychiatry   Tachycardia:  Please keep follow up with cardiology  Hair Loss:  A few months after having Covid many people see hair loss 2-3 months after the virus and this can last 6-9 months - this is common     Follow up:  Follow up in 3 months or sooner if needed

## 2020-03-22 NOTE — Assessment & Plan Note (Signed)
Covid  Pneumonia:  Most recent chest xray showed improvement  Continue inhalers as per pulmonary  Keep follow up with Dr. Chase Caller - PFT ordered   Memory loss:  Please keep follow up with neurology - EEG and MRI ordered   Anxiety:  Please follow up with psychiatry   Tachycardia:  Please keep follow up with cardiology  Hair Loss:  A few months after having Covid many people see hair loss 2-3 months after the virus and this can last 6-9 months - this is common     Follow up:  Follow up in 3 months or sooner if needed

## 2020-03-22 NOTE — Progress Notes (Signed)
@Patient  ID: [name], female    DOB: 23-Apr-1971, 49 y.o.   MRN: [id number]  Chief Complaint  Patient presents with  . Follow-up    2 month follow up, still having some brain fog. Feels like she has improved alot   . Alopecia    Brought in a small ziplock bag with hair she has lost.     Referring provider: Deland Pretty, MD   49 year ols female with history of hypertension, asthma, GERD, IBS, hypothyroidism, anxiety, hyperlipidemia, and obesity. Diagnosed with Covid on 12/20/19.   HPI  Patient presents today for post Covid care clinic visit follow-up.  Since her last visit she has been slowly improving.  She states that she no longer feels as short of breath.  She has followed up with pulmonary and does have a PFT and overnight pulse oximetry scheduled.  She is currently using proair as needed.  She did quit her trilogy due to side effects.  She did have a repeat chest x-ray at her last visit with pulmonary which did show improvement.  Patient has followed up with neurology and does have an EEG G and MRI ordered.  She has also followed up with psychiatry and is getting ready to start [place].  Patient has had an echo which was overall normal but did show slight mitral valve regurgitation.  She does have an upcoming follow-up with cardiology.  She is concerned today of her recent hair loss.  We discussed that this is a normal process called hair shedding after a varus or high fever.  Denies f/c/s, n/v/d, hemoptysis, PND, chest pain or edema.       Allergies  Allergen Reactions  . Allegra [Fexofenadine Hcl] Shortness Of Breath and Rash  . Fexofenadine Shortness Of Breath, Rash and Other (See Comments)    Shakes, can't sleep, trouble breathing  . Oseltamivir Other (See Comments)    Can't breathe, sick to stomach  . Tamiflu [Oseltamivir Phosphate] Itching, Nausea And Vomiting and Other (See Comments)    Can't breathe, sick to stomach  . Vancomycin Other (See Comments)    Red man's  Syndrome.  . Diphenhydramine Other (See Comments)    Shakes, can't sleep, trouble breathing  . Latex Itching, Dermatitis and Rash  . Sulfamethoxazole Rash  . Aspirin Nausea And Vomiting  . Doxycycline   . Other     NUT ALLERGY  . Diphenhydramine Hcl Palpitations and Rash  . Morphine Other (See Comments)    REACTION: insomnia  . Oseltamivir Phosphate Itching and Nausea And Vomiting  . Penicillins Itching and Swelling    Can tolerate Amoxicillin OK.  . Sulfonamide Derivatives Rash    Immunization History  Administered Date(s) Administered  . Influenza,inj,Quad PF,6+ Mos 06/16/2019  . Pneumococcal Polysaccharide-23 02/20/2012  . Tdap 02/20/2012    Past Medical History:  Diagnosis Date  . Anxiety   . Arrhythmia    heart races when pt in pain  . Asthma   . Clostridium difficile infection 2014  . [place] Patient virus infection   . Fibromyalgia   . GERD (gastroesophageal reflux disease)   . History of COVID-19   . Hypertension   . Hypothyroidism   . Immune deficiency disorder ([place])   . Lyme disease   . Mold contact confirmed    Toxic Mold syndrome  . Mononucleosis 5-10  . Ovarian cyst    Serous Cystadenofibroma-Left ovary  . PONV (postoperative nausea and vomiting)   . Shortness of breath  on exertion  . Systolic murmur   . Thyroid disease     Tobacco History: Social History   Tobacco Use  Smoking Status Never Smoker  Smokeless Tobacco Never Used   Counseling given: Not Answered   Outpatient Encounter Medications as of 03/22/2020  Medication Sig  . albuterol (PROAIR HFA) 108 (90 Base) MCG/ACT inhaler Inhale 2 puffs into the lungs every 6 (six) hours as needed for wheezing or shortness of breath.  . ALPRAZolam (XANAX XR) 0.5 MG 24 hr tablet Take 0.5 mg by mouth in the morning and at bedtime.  Marland Kitchen amLODipine (NORVASC) 2.5 MG tablet Take 1 tablet (2.5 mg total) by mouth daily. NEED OV.  . baclofen (LIORESAL) 20 MG tablet Take 20 mg by mouth as needed.  .  bisoprolol (ZEBETA) 10 MG tablet Take 10 mg by mouth daily.  . Buprenorphine HCl (BELBUCA) 450 MCG FILM Place 1 Film inside cheek in the morning and at bedtime.  . cetirizine (ZYRTEC) 10 MG tablet Take 10 mg by mouth daily.   . Cholecalciferol (VITAMIN D PO) Take 1 tablet by mouth daily.   . citalopram (CELEXA) 40 MG tablet Take 40 mg by mouth daily.  . fluticasone (FLONASE) 50 MCG/ACT nasal spray Place 1 spray into both nostrils daily.  . hydrOXYzine (ATARAX/VISTARIL) 25 MG tablet Take 25 mg by mouth as needed for anxiety.   Marland Kitchen ibuprofen (ADVIL) 200 MG tablet Take by mouth.  . Melatonin 10 MG TABS Take 10 mg by mouth at bedtime.   . Multiple Vitamin (MULTI-VITAMIN) tablet Take by mouth.  . norethindrone (MICRONOR) 0.35 MG tablet Take 1 tablet (0.35 mg total) by mouth daily.  Marland Kitchen nystatin (MYCOSTATIN/NYSTOP) powder Apply 1 application topically 2 (two) times daily.  . Probiotic Product (ALIGN PO) Take 1 capsule by mouth daily.   . prochlorperazine (COMPAZINE) 10 MG tablet Take 10 mg by mouth every 6 (six) hours as needed for nausea or vomiting.   . promethazine (PHENERGAN) 25 MG tablet Take 25 mg by mouth every 6 (six) hours as needed for nausea or vomiting.   . ranitidine (ZANTAC) 150 MG tablet Take by mouth.  Marland Kitchen rOPINIRole (REQUIP) 2 MG tablet Take 2 mg by mouth at bedtime.   Marland Kitchen scopolamine (TRANSDERM-SCOP) 1 MG/3DAYS Place 1 mg onto the skin every three (3) days as needed. For 30 days  . thyroid (ARMOUR) 90 MG tablet Take 180 mg by mouth daily. NP Thyroid  . vitamin B-12 (CYANOCOBALAMIN) 500 MCG tablet Take 500 mcg by mouth daily.  . busPIRone (BUSPAR) 15 MG tablet TAKE 1/3 TAB TWICE DAILY X1 WEEK, THEN 2/3 TAB TWICE DAILY X1 WEEK, THEN 1 TAB TWICE A DAY  . colesevelam (WELCHOL) 625 MG tablet Take 625 mg by mouth as needed.  (Patient not taking: Reported on 03/22/2020)  . Hyoscyamine Sulfate SL 0.125 MG SUBL Take 0.125 mg by mouth every 4 (four) hours as needed for cramping.  .  Ibuprofen-Famotidine (DUEXIS) 800-26.6 MG TABS Take by mouth.  Marland Kitchen POLY-IRON 150 150 MG capsule Take 300 mg by mouth daily.  . valACYclovir (VALTREX) 1000 MG tablet Take by mouth.  . [DISCONTINUED] norethindrone-ethinyl estradiol (JUNEL FE 1/20) 1-20 MG-MCG tablet Take 1 tablet by mouth daily. Continuous use for Dysmenorrhea   No facility-administered encounter medications on file as of 03/22/2020.     Review of Systems  Review of Systems  Constitutional: Positive for fatigue.  HENT: Negative.   Respiratory: Positive for shortness of breath. Negative for cough.   Cardiovascular:  Negative.  Negative for chest pain, palpitations and leg swelling.  Gastrointestinal: Negative.   Allergic/Immunologic: Negative.   Neurological: Negative.   Psychiatric/Behavioral: Positive for decreased concentration.       Physical Exam  BP 128/72   Pulse 70   Temp (!) 97.1 F (36.2 C)   Ht 5' (1.524 m)   Wt 202 lb 0.1 oz (91.6 kg)   SpO2 98%   BMI 39.45 kg/m   Wt Readings from Last 5 Encounters:  03/22/20 202 lb 0.1 oz (91.6 kg)  03/04/20 201 lb (91.2 kg)  02/16/20 202 lb 9.6 oz (91.9 kg)  01/28/20 199 lb 6.4 oz (90.4 kg)  01/19/20 199 lb 8 oz (90.5 kg)     Physical Exam Vitals and nursing note reviewed.  Constitutional:      General: She is not in acute distress.    Appearance: She is well-developed.  Cardiovascular:     Rate and Rhythm: Normal rate and regular rhythm.  Pulmonary:     Effort: Pulmonary effort is normal.     Breath sounds: Normal breath sounds.  Neurological:     Mental Status: She is alert and oriented to person, place, and time.      Lab Results:  CBC    Component Value Date/Time   WBC 7.7 01/23/2020 1202   WBC 4.9 12/30/2019 0658   RBC 4.47 01/23/2020 1202   RBC 4.19 12/30/2019 0658   HGB 12.7 01/23/2020 1202   HGB 13.9 09/09/2013 1102   HCT 39.5 01/23/2020 1202   HCT 40.9 09/09/2013 1102   PLT 332 01/23/2020 1202   MCV 88 01/23/2020 1202   MCV  89.1 09/09/2013 1102   MCH 28.4 01/23/2020 1202   MCH 28.2 12/30/2019 0658   MCHC 32.2 01/23/2020 1202   MCHC 31.9 12/30/2019 0658   RDW 13.5 01/23/2020 1202   RDW 13.6 09/09/2013 1102   LYMPHSABS 3.1 01/23/2020 1202   LYMPHSABS 3.2 09/09/2013 1102   MONOABS 0.3 12/30/2019 0658   MONOABS 0.5 09/09/2013 1102   EOSABS 0.4 01/23/2020 1202   BASOSABS 0.1 01/23/2020 1202   BASOSABS 0.1 09/09/2013 1102    BMET    Component Value Date/Time   NA 140 01/23/2020 1202   NA 142 09/09/2013 1102   K 4.2 01/23/2020 1202   K 4.3 09/09/2013 1102   CL 99 01/23/2020 1202   CO2 20 01/23/2020 1202   CO2 27 09/09/2013 1102   GLUCOSE 105 (H) 01/23/2020 1202   GLUCOSE 135 (H) 12/30/2019 0658   GLUCOSE 95 09/09/2013 1102   BUN 12 01/23/2020 1202   BUN 11.0 09/09/2013 1102   CREATININE 0.87 01/23/2020 1202   CREATININE 0.9 09/09/2013 1102   CALCIUM 9.3 01/23/2020 1202   CALCIUM 9.9 09/09/2013 1102   GFRNONAA 80 01/23/2020 1202   GFRAA 92 01/23/2020 1202     Imaging: ECHOCARDIOGRAM COMPLETE  Result Date: 02/27/2020    ECHOCARDIOGRAM REPORT   Patient Name:   SANARI OFFNER Date of Exam: 02/26/2020 Medical Rec #:  [id number]       Height:       60.0 in Accession #:    [id number]      Weight:       202.6 lb Date of Birth:  September 08, 1970       BSA:          1.877 m Patient Age:    56 years        BP:  134/78 mmHg Patient Gender: F               HR:           60 bpm. Exam Location:  [facility] Procedure: 2D Echo, Color Doppler, Cardiac Doppler and Intracardiac            Opacification Agent Indications:    R00.0 Tachycardia; R06.02 SOB; R01.1 Murmur  History:        Patient has prior history of Echocardiogram examinations, most                 recent 04/21/2017. Signs/Symptoms:Shortness of Breath and Murmur;                 Risk Factors:Hypertension and Dyslipidemia. GERD                  s/p Covid in 12/2019 with severe symptoms and pneumonia. SOB,                 tachycardia, fever, severe vomiting.   Sonographer:    Pilar Jarvis RDMS, RVT, RDCS Referring Phys: [place]  1. Left ventricular ejection fraction, by estimation, is 60 to 65%. The left ventricle has normal function. The left ventricle has no regional wall motion abnormalities. Left ventricular diastolic parameters were normal.  2. Right ventricular systolic function is normal. The right ventricular size is normal. There is normal pulmonary artery systolic pressure.  3. The mitral valve is normal in structure. Trivial mitral valve regurgitation. No evidence of mitral stenosis.  4. The aortic valve is normal in structure. Aortic valve regurgitation is not visualized. No aortic stenosis is present.  5. The inferior vena cava is normal in size with greater than 50% respiratory variability, suggesting right atrial pressure of 3 mmHg. FINDINGS  Left Ventricle: Left ventricular ejection fraction, by estimation, is 60 to 65%. The left ventricle has normal function. The left ventricle has no regional wall motion abnormalities. Definity contrast agent was given IV to delineate the left ventricular  endocardial borders. The left ventricular internal cavity size was normal in size. There is no left ventricular hypertrophy. Left ventricular diastolic parameters were normal. Right Ventricle: The right ventricular size is normal. No increase in right ventricular wall thickness. Right ventricular systolic function is normal. There is normal pulmonary artery systolic pressure. The tricuspid regurgitant velocity is 2.48 m/s, and  with an assumed right atrial pressure of 3 mmHg, the estimated right ventricular systolic pressure is 16.1 mmHg. Left Atrium: Left atrial size was normal in size. Right Atrium: Right atrial size was normal in size. Pericardium: There is no evidence of pericardial effusion. Mitral Valve: The mitral valve is normal in structure. Normal mobility of the mitral valve leaflets. Trivial mitral valve regurgitation. No evidence of  mitral valve stenosis. Tricuspid Valve: The tricuspid valve is normal in structure. Tricuspid valve regurgitation is trivial. No evidence of tricuspid stenosis. Aortic Valve: The aortic valve is normal in structure. Aortic valve regurgitation is not visualized. No aortic stenosis is present. Aortic valve mean gradient measures 7.0 mmHg. Aortic valve peak gradient measures 14.1 mmHg. Aortic valve area, by VTI measures 2.39 cm. Pulmonic Valve: The pulmonic valve was normal in structure. Pulmonic valve regurgitation is not visualized. No evidence of pulmonic stenosis. Aorta: The aortic root is normal in size and structure. Venous: The inferior vena cava is normal in size with greater than 50% respiratory variability, suggesting right atrial pressure of 3 mmHg. IAS/Shunts: No atrial level shunt  detected by color flow Doppler.  LEFT VENTRICLE PLAX 2D LVIDd:         4.50 cm  Diastology LVIDs:         2.80 cm  LV e' lateral:   9.36 cm/s LV PW:         0.90 cm  LV E/e' lateral: 11.6 LV IVS:        0.80 cm  LV e' medial:    7.83 cm/s LVOT diam:     2.00 cm  LV E/e' medial:  13.9 LV SV:         92 LV SV Index:   49 LVOT Area:     3.14 cm  RIGHT VENTRICLE             IVC RV Basal diam:  3.50 cm     IVC diam: 1.50 cm RV S prime:     12.10 cm/s TAPSE (M-mode): 2.8 cm LEFT ATRIUM             Index       RIGHT ATRIUM           Index LA diam:        3.60 cm 1.92 cm/m  RA Area:     11.30 cm LA Vol (A2C):   50.3 ml 26.80 ml/m RA Volume:   23.10 ml  12.31 ml/m LA Vol (A4C):   46.5 ml 24.78 ml/m LA Biplane Vol: 51.2 ml 27.28 ml/m  AORTIC VALVE                    PULMONIC VALVE AV Area (Vmax):    2.19 cm     PV Vmax:       0.94 m/s AV Area (Vmean):   2.30 cm     PV Peak grad:  3.6 mmHg AV Area (VTI):     2.39 cm AV Vmax:           188.00 cm/s AV Vmean:          120.000 cm/s AV VTI:            0.385 m AV Peak Grad:      14.1 mmHg AV Mean Grad:      7.0 mmHg LVOT Vmax:         131.00 cm/s LVOT Vmean:        87.900 cm/s LVOT  VTI:          0.293 m LVOT/AV VTI ratio: 0.76  AORTA Ao Root diam: 2.40 cm Ao Arch diam: 2.5 cm MITRAL VALVE                TRICUSPID VALVE MV Area (PHT): 3.37 cm     TR Peak grad:   24.6 mmHg MV Decel Time: 225 msec     TR Vmax:        248.00 cm/s MV E velocity: 109.00 cm/s MV A velocity: 89.10 cm/s   SHUNTS MV E/A ratio:  1.22         Systemic VTI:  0.29 m                             Systemic Diam: 2.00 cm Kathlyn Sacramento MD Electronically signed by Kathlyn Sacramento MD Signature Date/Time: 02/27/2020/11:45:19 AM    Final      Assessment & Plan:   History of COVID-19 Covid  Pneumonia:  Most recent chest xray showed improvement  Continue inhalers as  per pulmonary  Keep follow up with Dr. Chase Caller - PFT ordered   Memory loss:  Please keep follow up with neurology - EEG and MRI ordered   Anxiety:  Please follow up with psychiatry   Tachycardia:  Please keep follow up with cardiology  Hair Loss:  A few months after having Covid many people see hair loss 2-3 months after the virus and this can last 6-9 months - this is common     Follow up:  Follow up in 3 months or sooner if needed      [name], NP 03/22/2020

## 2020-03-25 ENCOUNTER — Ambulatory Visit: Payer: BC Managed Care – PPO | Admitting: Neurology

## 2020-03-25 DIAGNOSIS — R410 Disorientation, unspecified: Secondary | ICD-10-CM | POA: Diagnosis not present

## 2020-03-25 DIAGNOSIS — F488 Other specified nonpsychotic mental disorders: Secondary | ICD-10-CM

## 2020-03-25 DIAGNOSIS — R4189 Other symptoms and signs involving cognitive functions and awareness: Secondary | ICD-10-CM

## 2020-03-30 ENCOUNTER — Other Ambulatory Visit (HOSPITAL_COMMUNITY): Payer: BC Managed Care – PPO

## 2020-03-30 ENCOUNTER — Other Ambulatory Visit (HOSPITAL_COMMUNITY)
Admission: RE | Admit: 2020-03-30 | Discharge: 2020-03-30 | Disposition: A | Discharge status: Home / Self Care | Payer: BC Managed Care – PPO | Source: Ambulatory Visit | Attending: Adult Health | Admitting: Adult Health

## 2020-03-30 DIAGNOSIS — Z20822 Contact with and (suspected) exposure to covid-19: Secondary | ICD-10-CM | POA: Diagnosis not present

## 2020-03-30 DIAGNOSIS — Z01812 Encounter for preprocedural laboratory examination: Secondary | ICD-10-CM | POA: Diagnosis not present

## 2020-03-30 LAB — SARS CORONAVIRUS 2 (TAT 6-24 HRS): SARS Coronavirus 2: NEGATIVE

## 2020-04-02 ENCOUNTER — Ambulatory Visit (INDEPENDENT_AMBULATORY_CARE_PROVIDER_SITE_OTHER): Payer: BC Managed Care – PPO | Admitting: Internal Medicine

## 2020-04-02 ENCOUNTER — Other Ambulatory Visit: Payer: Self-pay

## 2020-04-02 DIAGNOSIS — R7989 Other specified abnormal findings of blood chemistry: Secondary | ICD-10-CM

## 2020-04-02 DIAGNOSIS — R002 Palpitations: Secondary | ICD-10-CM

## 2020-04-02 DIAGNOSIS — Z8616 Personal history of COVID-19: Secondary | ICD-10-CM

## 2020-04-02 DIAGNOSIS — R0600 Dyspnea, unspecified: Secondary | ICD-10-CM

## 2020-04-02 DIAGNOSIS — R0609 Other forms of dyspnea: Secondary | ICD-10-CM

## 2020-04-02 DIAGNOSIS — R5383 Other fatigue: Secondary | ICD-10-CM

## 2020-04-02 LAB — PULMONARY FUNCTION TEST
DL/VA % pred: 97 %
DL/VA: 4.32 ml/min/mmHg/L
DLCO cor % pred: 89 %
DLCO cor: 17.26 ml/min/mmHg
DLCO unc % pred: 89 %
DLCO unc: 17.26 ml/min/mmHg
FEF 25-75 Post: 2.53 L/sec
FEF 25-75 Pre: 2.11 L/sec
FEF2575-%Change-Post: 19 %
FEF2575-%Pred-Post: 93 %
FEF2575-%Pred-Pre: 78 %
FEV1-%Change-Post: 2 %
FEV1-%Pred-Post: 83 %
FEV1-%Pred-Pre: 81 %
FEV1-Post: 2.17 L
FEV1-Pre: 2.12 L
FEV1FVC-%Change-Post: 0 %
FEV1FVC-%Pred-Pre: 101 %
FEV6-%Change-Post: 2 %
FEV6-%Pred-Post: 83 %
FEV6-%Pred-Pre: 81 %
FEV6-Post: 2.65 L
FEV6-Pre: 2.59 L
FEV6FVC-%Pred-Post: 102 %
FEV6FVC-%Pred-Pre: 102 %
FVC-%Change-Post: 2 %
FVC-%Pred-Post: 82 %
FVC-%Pred-Pre: 80 %
FVC-Post: 2.65 L
FVC-Pre: 2.59 L
Post FEV1/FVC ratio: 82 %
Post FEV6/FVC ratio: 100 %
Pre FEV1/FVC ratio: 82 %
Pre FEV6/FVC Ratio: 100 %
RV % pred: 85 %
RV: 1.35 L
TLC % pred: 84 %
TLC: 3.9 L

## 2020-04-02 NOTE — Progress Notes (Signed)
PFT done today. 

## 2020-04-04 ENCOUNTER — Other Ambulatory Visit: Payer: Self-pay | Admitting: Psychiatry

## 2020-04-04 DIAGNOSIS — F411 Generalized anxiety disorder: Secondary | ICD-10-CM

## 2020-04-07 ENCOUNTER — Other Ambulatory Visit: Payer: BC Managed Care – PPO

## 2020-04-08 NOTE — Procedures (Signed)
   HISTORY: 49 year old female presented with memory loss brain fog following COVID-19 infection in April 2021   TECHNIQUE:  This is a routine 16 channel EEG recording with one channel devoted to a limited EKG recording.  It was performed during wakefulness, drowsiness and asleep.  Hyperventilation and photic stimulation were performed as activating procedures.  There are minimum muscle and movement artifact noted.  Upon maximum arousal, posterior dominant waking rhythm consistent of rhythmic alpha range activity, with frequency of 10 hz. Activities are symmetric over the bilateral posterior derivations and attenuated with eye opening.  Hyperventilation produced mild/moderate buildup with higher amplitude and the slower activities noted.  Photic stimulation did not alter the tracing.  During EEG recording, patient developed drowsiness and entered sleep, sleep EEG demonstrated architecture, there were frontal centrally dominant vertex waves and symmetric sleep spindles noted.  During EEG recording, there was no epileptiform discharge noted.  EKG demonstrate sinus rhythm, with heart rate of 68 bpm  CONCLUSION: This is a  normal awake and sleep EEG.  There is no electrodiagnostic evidence of epileptiform discharge.  Marcial Pacas, M.D. Ph.D.  [facility] [place], [place] [address] Phone: [phone] Fax:      [phone]

## 2020-04-13 ENCOUNTER — Ambulatory Visit: Payer: BC Managed Care – PPO | Admitting: Adult Health

## 2020-04-15 ENCOUNTER — Ambulatory Visit: Payer: BC Managed Care – PPO | Admitting: Adult Health

## 2020-04-15 ENCOUNTER — Ambulatory Visit (INDEPENDENT_AMBULATORY_CARE_PROVIDER_SITE_OTHER): Payer: BC Managed Care – PPO

## 2020-04-15 ENCOUNTER — Other Ambulatory Visit: Payer: Self-pay

## 2020-04-15 ENCOUNTER — Encounter: Payer: Self-pay | Admitting: Adult Health

## 2020-04-15 VITALS — BP 116/76 | HR 78 | Wt 198.2 lb

## 2020-04-15 DIAGNOSIS — R0600 Dyspnea, unspecified: Secondary | ICD-10-CM

## 2020-04-15 DIAGNOSIS — J1282 Pneumonia due to coronavirus disease 2019: Secondary | ICD-10-CM | POA: Diagnosis not present

## 2020-04-15 DIAGNOSIS — R0609 Other forms of dyspnea: Secondary | ICD-10-CM

## 2020-04-15 DIAGNOSIS — U071 COVID-19: Secondary | ICD-10-CM

## 2020-04-15 DIAGNOSIS — J4530 Mild persistent asthma, uncomplicated: Secondary | ICD-10-CM

## 2020-04-15 MED ORDER — AEROCHAMBER MV MISC
0 refills | Status: DC
Start: 2020-04-15 — End: 2022-07-24

## 2020-04-15 MED ORDER — QVAR REDIHALER 40 MCG/ACT IN AERB: 2.0000 | INHALATION_SPRAY | Freq: Two times a day (BID) | RESPIRATORY_TRACT | 5 refills | Status: DC

## 2020-04-15 MED ORDER — CLOTRIMAZOLE 10 MG MT TROC
10.0000 mg | Freq: Every day | OROMUCOSAL | 0 refills | Status: AC
Start: 2020-04-15 — End: 2020-04-22

## 2020-04-15 NOTE — Progress Notes (Signed)
@Patient  ID: [name], female    DOB: 07/28/1971, 49 y.o.   MRN: [id number]  Chief Complaint  Patient presents with  . Follow-up    Asthma     Referring provider: Deland Pretty, MD  HPI: 49 year old female never smoker followed for asthma and allergies COVID-19 infection and pneumonia April 2021 required hospitalization and received monoclonal antibody infusion, remdesivir and Decadron  TEST/EVENTS :   04/15/2020 Follow up : Asthma /Allergies , COVID PNA  Patient presents for a 10-month follow-up.  Patient says since last visit she is trying to do better.  She is becoming more active.  Feels that her breathing has improved but continues to have some limitations in her activity tolerance.  She uses albuterol which does help.  Previously was on Trelegy but could not tolerate due to recurrent thrush.  Did feel like it helped her breathing.  She denies any chest pain orthopnea PND or leg swelling.  As above patient did have COVID-19 infection and pneumonia in April 2021.  She had a very severe case and was hospitalized.  Prior to hospitalization she did receive monoclonal antibody infusion but continued to worsen and was admitted requiring remdesivir and Decadron.  She has slowly improved since then.  Follow-up chest x-ray did show in May .  Pulmonary function testing done on April 03, 2020 showed normal lung function with an FEV1 at 83%, ratio 82, FVC 82%, no significant bronchodilator response, mid flow reversibility.,  DLCO 89%.  Patient does complain of irritation in her mouth.  Feels like she does not clear totally in her thrush. Allergies  Allergen Reactions  . Allegra [Fexofenadine Hcl] Shortness Of Breath and Rash  . Fexofenadine Shortness Of Breath, Rash and Other (See Comments)    Shakes, can't sleep, trouble breathing  . Oseltamivir Other (See Comments)    Can't breathe, sick to stomach  . Tamiflu [Oseltamivir Phosphate] Itching, Nausea And Vomiting and Other (See Comments)      Can't breathe, sick to stomach  . Vancomycin Other (See Comments)    Red man's Syndrome.  . Diphenhydramine Other (See Comments)    Shakes, can't sleep, trouble breathing  . Latex Itching, Dermatitis and Rash  . Sulfamethoxazole Rash  . Aspirin Nausea And Vomiting  . Doxycycline   . Other     NUT ALLERGY  . Diphenhydramine Hcl Palpitations and Rash  . Morphine Other (See Comments)    REACTION: insomnia  . Oseltamivir Phosphate Itching and Nausea And Vomiting  . Penicillins Itching and Swelling    Can tolerate Amoxicillin OK.  . Sulfonamide Derivatives Rash    Immunization History  Administered Date(s) Administered  . Influenza,inj,Quad PF,6+ Mos 06/16/2019  . Pneumococcal Polysaccharide-23 02/20/2012  . Tdap 02/20/2012    Past Medical History:  Diagnosis Date  . Anxiety   . Arrhythmia    heart races when pt in pain  . Asthma   . Clostridium difficile infection 2014  . Randell Patient virus infection   . Fibromyalgia   . GERD (gastroesophageal reflux disease)   . History of COVID-19   . Hypertension   . Hypothyroidism   . Immune deficiency disorder ([place])   . Lyme disease   . Mold contact confirmed    Toxic Mold syndrome  . Mononucleosis 5-10  . Ovarian cyst    Serous Cystadenofibroma-Left ovary  . PONV (postoperative nausea and vomiting)   . Shortness of breath    on exertion  . Systolic murmur   . Thyroid  disease     Tobacco History: Social History   Tobacco Use  Smoking Status Never Smoker  Smokeless Tobacco Never Used   Counseling given: Not Answered   Outpatient Medications Prior to Visit  Medication Sig Dispense Refill  . albuterol (PROAIR HFA) 108 (90 Base) MCG/ACT inhaler Inhale 2 puffs into the lungs every 6 (six) hours as needed for wheezing or shortness of breath. 18 g 3  . ALPRAZolam (XANAX XR) 0.5 MG 24 hr tablet Take 0.5 mg by mouth in the morning and at bedtime.    Marland Kitchen amLODipine (NORVASC) 2.5 MG tablet Take 1 tablet (2.5 mg total) by  mouth daily. NEED OV. 90 tablet 3  . baclofen (LIORESAL) 20 MG tablet Take 20 mg by mouth as needed.    . bisoprolol (ZEBETA) 10 MG tablet Take 10 mg by mouth daily.    . Buprenorphine HCl (BELBUCA) 450 MCG FILM Place 1 Film inside cheek in the morning and at bedtime.    . busPIRone (BUSPAR) 15 MG tablet TAKE 1 TAB TWICE A DAY 180 tablet 0  . cetirizine (ZYRTEC) 10 MG tablet Take 10 mg by mouth daily.     . Cholecalciferol (VITAMIN D PO) Take 1 tablet by mouth daily.     . citalopram (CELEXA) 40 MG tablet Take 40 mg by mouth daily.    . fluticasone (FLONASE) 50 MCG/ACT nasal spray Place 1 spray into both nostrils daily.    . hydrOXYzine (ATARAX/VISTARIL) 25 MG tablet Take 25 mg by mouth as needed for anxiety.     . Hyoscyamine Sulfate SL 0.125 MG SUBL Take 0.125 mg by mouth every 4 (four) hours as needed for cramping.    Marland Kitchen ibuprofen (ADVIL) 200 MG tablet Take by mouth.    . Melatonin 10 MG TABS Take 10 mg by mouth at bedtime.     . Multiple Vitamin (MULTI-VITAMIN) tablet Take by mouth.    . norethindrone (MICRONOR) 0.35 MG tablet Take 1 tablet (0.35 mg total) by mouth daily. 3 Package 4  . nystatin (MYCOSTATIN/NYSTOP) powder Apply 1 application topically 2 (two) times daily. 15 g 3  . POLY-IRON 150 150 MG capsule Take 300 mg by mouth daily.    . Probiotic Product (ALIGN PO) Take 1 capsule by mouth daily.     . prochlorperazine (COMPAZINE) 10 MG tablet Take 10 mg by mouth every 6 (six) hours as needed for nausea or vomiting.     . promethazine (PHENERGAN) 25 MG tablet Take 25 mg by mouth every 6 (six) hours as needed for nausea or vomiting.     . ranitidine (ZANTAC) 150 MG tablet Take by mouth.    Marland Kitchen rOPINIRole (REQUIP) 2 MG tablet Take 2 mg by mouth at bedtime.     Marland Kitchen thyroid (ARMOUR) 90 MG tablet Take 180 mg by mouth daily. NP Thyroid    . valACYclovir (VALTREX) 1000 MG tablet Take by mouth.    . vitamin B-12 (CYANOCOBALAMIN) 500 MCG tablet Take 500 mcg by mouth daily.    Marland Kitchen scopolamine  (TRANSDERM-SCOP) 1 MG/3DAYS Place 1 mg onto the skin every three (3) days as needed. For 30 days    . colesevelam (WELCHOL) 625 MG tablet Take 625 mg by mouth as needed.  (Patient not taking: Reported on 03/22/2020)     No facility-administered medications prior to visit.     Review of Systems:   Constitutional:   No  weight loss, night sweats,  Fevers, chills, fatigue, or  lassitude.  HEENT:  No headaches,  Difficulty swallowing,  Tooth/dental problems, or   +sore throat,                No sneezing, itching, ear ache, nasal congestion, post nasal drip,   CV:  No chest pain,  Orthopnea, PND, swelling in lower extremities, anasarca, dizziness, palpitations, syncope.   GI  No heartburn, indigestion, abdominal pain, nausea, vomiting, diarrhea, change in bowel habits, loss of appetite, bloody stools.   Resp:  No excess mucus, no productive cough,  No non-productive cough,  No coughing up of blood.  No change in color of mucus.  No wheezing.  No chest wall deformity  Skin: no rash or lesions.  GU: no dysuria, change in color of urine, no urgency or frequency.  No flank pain, no hematuria   MS:  No joint pain or swelling.  No decreased range of motion.  No back pain.    Physical Exam  BP 116/76 (BP Location: Left Arm, Cuff Size: Normal)   Pulse 78   Wt 202 lb (91.6 kg)   SpO2 100%   BMI 39.45 kg/m   GEN: A/Ox3; pleasant , NAD, well nourished    HEENT:  [place]/AT,   NOSE-clear, THROAT-clear, no lesions, no postnasal drip or exudate noted.   NECK:  Supple w/ fair ROM; no JVD; normal carotid impulses w/o bruits; no thyromegaly or nodules palpated; no lymphadenopathy.    RESP  Clear  P & A; w/o, wheezes/ rales/ or rhonchi. no accessory muscle use, no dullness to percussion  CARD:  RRR, no m/r/g, no peripheral edema, pulses intact, no cyanosis or clubbing.  GI:   Soft & nt; nml bowel sounds; no organomegaly or masses detected.   Musco: Warm bil, no deformities or joint swelling  noted.   Neuro: alert, no focal deficits noted.    Skin: Warm, no lesions or rashes    Lab Results:  CBC  BMET   BNP No results found for: BNP     PFT Results Latest Ref Rng & Units 04/02/2020  FVC-Pre L 2.59  FVC-Predicted Pre % 80  FVC-Post L 2.65  FVC-Predicted Post % 82  Pre FEV1/FVC % % 82  Post FEV1/FCV % % 82  FEV1-Pre L 2.12  FEV1-Predicted Pre % 81  FEV1-Post L 2.17  DLCO uncorrected ml/min/mmHg 17.26  DLCO UNC% % 89  DLCO corrected ml/min/mmHg 17.26  DLCO COR %Predicted % 89  DLVA Predicted % 97  TLC L 3.90  TLC % Predicted % 84  RV % Predicted % 85    No results found for: NITRICOXIDE      Assessment & Plan:   No problem-specific Assessment & Plan notes found for this encounter.     Rexene Edison, NP 04/15/2020

## 2020-04-15 NOTE — Patient Instructions (Signed)
Begin QVAR 40 mcg 2 puffs Twice daily with spacer , rinse after use.  Mycelex troches five times for 1 week .  Albuterol inhaler As needed   Follow up with Dr. Chase Caller in 3 months and As needed

## 2020-04-15 NOTE — Assessment & Plan Note (Signed)
Lung function today shows normal lung function with no airflow obstruction or restriction.  She did have some mid flow reversibility.  DLCO is normal.. Patient has been unable to tolerate dry powder inhaler.  We will try Qvar low-dose ICS with spacer to see if this gives her better control and limits her potential side effects such as thrush.  Plan  Patient Instructions  Begin QVAR 40 mcg 2 puffs Twice daily with spacer , rinse after use.  Mycelex troches five times for 1 week .  Albuterol inhaler As needed   Follow up with Dr. Chase Caller in 3 months and As needed

## 2020-04-15 NOTE — Assessment & Plan Note (Signed)
Clinically has improved.  Check chest x-ray today.

## 2020-04-19 NOTE — Progress Notes (Signed)
Apologies for delay but June TSH normal but T4 slightly abnormal. Plan - she needs to talk to PCP Beth Pretty, MD about this and followup with PCP .

## 2020-04-21 ENCOUNTER — Ambulatory Visit (INDEPENDENT_AMBULATORY_CARE_PROVIDER_SITE_OTHER): Payer: BC Managed Care – PPO | Admitting: Psychiatry

## 2020-04-21 ENCOUNTER — Other Ambulatory Visit: Payer: Self-pay

## 2020-04-21 ENCOUNTER — Encounter: Payer: Self-pay | Admitting: Psychiatry

## 2020-04-21 DIAGNOSIS — F5105 Insomnia due to other mental disorder: Secondary | ICD-10-CM

## 2020-04-21 DIAGNOSIS — F4001 Agoraphobia with panic disorder: Secondary | ICD-10-CM

## 2020-04-21 DIAGNOSIS — F411 Generalized anxiety disorder: Secondary | ICD-10-CM

## 2020-04-21 MED ORDER — RISPERIDONE 1 MG PO TABS: ORAL_TABLET | ORAL | 1 refills | Status: DC

## 2020-04-21 NOTE — Progress Notes (Signed)
[name] [id number] 05-03-1971 49 y.o.  Subjective:   Patient ID:  [name] is a 49 y.o. (DOB 04/28/1971) female.  Chief Complaint:  Chief Complaint  Patient presents with  . Follow-up  . Anxiety    HPI [name] presents to the office today for follow-up of panic disorder, generalized anxiety, and insomnia.  First seen December 02, 2019. Referred by Dr. Deland Pretty.  She was on alprazolam XR 0.5 mg twice daily and citalopram 40 mg daily along with opiates at the time. Buspirone was initiated.  04/21/2020 appointment with the following noted: Covid and hosp for a week 12/26/19.  Got Remdesivir.  Finally recovering.  Still tired and aches. Steroids helped chronic pain from Lyme dz but relapsed off steroids. On opiates Bel[place]uca still.  Still on Xanax XR 0.5 mg BID, citalopram 40 mg daily.  Off buspirone bc HA and shakey after a week. Still anxious about Covid.  Has to push herself to leave the house.  No full panic but panicky feelings. Still sees therapist Windle Guard. Settled worker's comp case from 10 years ago. Celexa does best of SSRI she's taken but not controlled. Normal QT interval now. Trouble with sleep sometimes.  Past Psychiatric History: therapist [facility] Remote Dr. Porfirio Mylar. Dr. Koleen Distance [facility] Past Psychiatric Medication Trials: Zoloft SE HA and itching. Paxil wt gain 30# and fatigue. Fluoxetine NR Lexapro  not as good as Celexa Gabapentin NR Gabatril Alprazolam 1 mg BID,  Cymbalta SE dryness dental problems, no pain benefit No buspirone   Review of Systems:  Review of Systems  Constitutional: Positive for fatigue.  Musculoskeletal: Positive for myalgias.  Neurological: Positive for weakness. Negative for tremors.    Medications: I have reviewed the patient's current medications.  Current Outpatient Medications  Medication Sig Dispense Refill  . albuterol (PROAIR HFA) 108 (90 Base) MCG/ACT inhaler Inhale 2 puffs into the lungs every 6  (six) hours as needed for wheezing or shortness of breath. 18 g 3  . ALPRAZolam (XANAX XR) 0.5 MG 24 hr tablet Take 0.5 mg by mouth in the morning and at bedtime.    Marland Kitchen amLODipine (NORVASC) 2.5 MG tablet Take 1 tablet (2.5 mg total) by mouth daily. NEED OV. 90 tablet 3  . baclofen (LIORESAL) 20 MG tablet Take 20 mg by mouth as needed.    . beclomethasone (QVAR REDIHALER) 40 MCG/ACT inhaler Inhale 2 puffs into the lungs 2 (two) times daily. 10.6 g 5  . bisoprolol (ZEBETA) 10 MG tablet Take 10 mg by mouth daily.    . Buprenorphine HCl (BELBUCA) 450 MCG FILM Place 1 Film inside cheek in the morning and at bedtime.    . cetirizine (ZYRTEC) 10 MG tablet Take 10 mg by mouth daily.     . Cholecalciferol (VITAMIN D PO) Take 1 tablet by mouth daily.     . citalopram (CELEXA) 40 MG tablet Take 40 mg by mouth daily.    . clotrimazole (MYCELEX) 10 MG troche Take 1 tablet (10 mg total) by mouth 5 (five) times daily for 7 days. 35 tablet 0  . fluticasone (FLONASE) 50 MCG/ACT nasal spray Place 1 spray into both nostrils daily.    . hydrOXYzine (ATARAX/VISTARIL) 25 MG tablet Take 25 mg by mouth as needed for anxiety.     . Hyoscyamine Sulfate SL 0.125 MG SUBL Take 0.125 mg by mouth every 4 (four) hours as needed for cramping.    Marland Kitchen ibuprofen (ADVIL) 200 MG tablet Take by  mouth.    . Melatonin 10 MG TABS Take 10 mg by mouth at bedtime.     . Multiple Vitamin (MULTI-VITAMIN) tablet Take by mouth.    . norethindrone (MICRONOR) 0.35 MG tablet Take 1 tablet (0.35 mg total) by mouth daily. 3 Package 4  . nystatin (MYCOSTATIN/NYSTOP) powder Apply 1 application topically 2 (two) times daily. 15 g 3  . POLY-IRON 150 150 MG capsule Take 300 mg by mouth daily.    . Probiotic Product (ALIGN PO) Take 1 capsule by mouth daily.     . prochlorperazine (COMPAZINE) 10 MG tablet Take 10 mg by mouth every 6 (six) hours as needed for nausea or vomiting.     . promethazine (PHENERGAN) 25 MG tablet Take 25 mg by mouth every 6 (six)  hours as needed for nausea or vomiting.     . ranitidine (ZANTAC) 150 MG tablet Take by mouth.    Marland Kitchen rOPINIRole (REQUIP) 2 MG tablet Take 2 mg by mouth at bedtime.     Marland Kitchen Spacer/Aero-Holding Chambers (AEROCHAMBER MV) inhaler Use as instructed 1 each 0  . thyroid (ARMOUR) 90 MG tablet Take 180 mg by mouth daily. NP Thyroid    . valACYclovir (VALTREX) 1000 MG tablet Take by mouth.    . vitamin B-12 (CYANOCOBALAMIN) 500 MCG tablet Take 500 mcg by mouth daily.    . risperiDONE (RISPERDAL) 1 MG tablet 1/2 tablet nightly for 1 week, then 1 nightly 30 tablet 1   No current facility-administered medications for this visit.    Medication Side Effects: None  Allergies:  Allergies  Allergen Reactions  . Allegra [Fexofenadine Hcl] Shortness Of Breath and Rash  . Fexofenadine Shortness Of Breath, Rash and Other (See Comments)    Shakes, can't sleep, trouble breathing  . Oseltamivir Other (See Comments)    Can't breathe, sick to stomach  . Tamiflu [Oseltamivir Phosphate] Itching, Nausea And Vomiting and Other (See Comments)    Can't breathe, sick to stomach  . Vancomycin Other (See Comments)    Red man's Syndrome.  . Diphenhydramine Other (See Comments)    Shakes, can't sleep, trouble breathing  . Latex Itching, Dermatitis and Rash  . Sulfamethoxazole Rash  . Aspirin Nausea And Vomiting  . Doxycycline   . Other     NUT ALLERGY  . Diphenhydramine Hcl Palpitations and Rash  . Morphine Other (See Comments)    REACTION: insomnia  . Oseltamivir Phosphate Itching and Nausea And Vomiting  . Penicillins Itching and Swelling    Can tolerate Amoxicillin OK.  . Sulfonamide Derivatives Rash    Past Medical History:  Diagnosis Date  . Anxiety   . Arrhythmia    heart races when pt in pain  . Asthma   . Clostridium difficile infection 2014  . [place]ell Patient virus infection   . Fibromyalgia   . GERD (gastroesophageal reflux disease)   . History of COVID-19   . Hypertension   . Hypothyroidism    . Immune deficiency disorder ([place])   . Lyme disease   . Mold contact confirmed    Toxic Mold syndrome  . Mononucleosis 5-10  . Ovarian cyst    Serous Cystadenofibroma-Left ovary  . PONV (postoperative nausea and vomiting)   . Shortness of breath    on exertion  . Systolic murmur   . Thyroid disease     Family History  Problem Relation Age of Onset  . Hypertension Mother   . Diabetes Mother   . Pulmonary embolism Mother  x2  . Heart disease Mother   . Breast cancer Maternal Aunt        Age 72  . Pancreatic cancer Maternal Grandmother   . Heart disease Maternal Grandfather   . Breast cancer Paternal Grandmother        Age 76  . Asthma Paternal Grandmother   . Hypertension Father   . Thyroid disease Father     Social History   Socioeconomic History  . Marital status: Single    Spouse name: Not on file  . Number of children: 0  . Years of education: college  . Highest education level: Not on file  Occupational History  . Occupation: Pharmacist, hospital  Tobacco Use  . Smoking status: Never Smoker  . Smokeless tobacco: Never Used  Vaping Use  . Vaping Use: Never used  Substance and Sexual Activity  . Alcohol use: No    Alcohol/week: 0.0 standard drinks  . Drug use: No  . Sexual activity: Never    Birth control/protection: Pill  Other Topics Concern  . Not on file  Social History Narrative   Lives at home alone.   Right-handed.   Caffeine use: 1 cup per day.   Social Determinants of Health   Financial Resource Strain:   . Difficulty of Paying Living Expenses:   Food Insecurity:   . Worried About Charity fundraiser in the Last Year:   . Arboriculturist in the Last Year:   Transportation Needs:   . Film/video editor (Medical):   Marland Kitchen Lack of Transportation (Non-Medical):   Physical Activity:   . Days of Exercise per Week:   . Minutes of Exercise per Session:   Stress:   . Feeling of Stress :   Social Connections:   . Frequency of Communication with  Friends and Family:   . Frequency of Social Gatherings with Friends and Family:   . Attends Religious Services:   . Active Member of Clubs or Organizations:   . Attends Archivist Meetings:   Marland Kitchen Marital Status:   Intimate Partner Violence:   . Fear of Current or Ex-Partner:   . Emotionally Abused:   Marland Kitchen Physically Abused:   . Sexually Abused:     Past Medical History, Surgical history, Social history, and Family history were reviewed and updated as appropriate.   Please see review of systems for further details on the patient's review from today.   Objective:   Physical Exam:  There were no vitals taken for this visit.  Physical Exam Constitutional:      General: She is not in acute distress. Musculoskeletal:        General: No deformity.  Neurological:     Mental Status: She is alert and oriented to person, place, and time.     Coordination: Coordination normal.  Psychiatric:        Attention and Perception: Attention and perception normal. She does not perceive auditory or visual hallucinations.        Mood and Affect: Mood is anxious. Mood is not depressed. Affect is not labile, blunt, angry or inappropriate.        Speech: Speech normal.        Behavior: Behavior normal.        Thought Content: Thought content normal. Thought content is not paranoid or delusional. Thought content does not include homicidal or suicidal ideation. Thought content does not include homicidal or suicidal plan.        Cognition and  Memory: Cognition and memory normal.        Judgment: Judgment normal.     Comments: Insight fair     Lab Review:     Component Value Date/Time   NA 140 01/23/2020 1202   NA 142 09/09/2013 1102   K 4.2 01/23/2020 1202   K 4.3 09/09/2013 1102   CL 99 01/23/2020 1202   CO2 20 01/23/2020 1202   CO2 27 09/09/2013 1102   GLUCOSE 105 (H) 01/23/2020 1202   GLUCOSE 135 (H) 12/30/2019 0658   GLUCOSE 95 09/09/2013 1102   BUN 12 01/23/2020 1202   BUN 11.0  09/09/2013 1102   CREATININE 0.87 01/23/2020 1202   CREATININE 0.9 09/09/2013 1102   CALCIUM 9.3 01/23/2020 1202   CALCIUM 9.9 09/09/2013 1102   PROT 7.1 01/23/2020 1202   PROT 7.9 09/09/2013 1102   ALBUMIN 4.2 01/23/2020 1202   ALBUMIN 3.5 09/09/2013 1102   AST 24 01/23/2020 1202   AST 19 09/09/2013 1102   ALT 19 01/23/2020 1202   ALT 18 09/09/2013 1102   ALKPHOS 88 01/23/2020 1202   ALKPHOS 80 09/09/2013 1102   BILITOT 0.2 01/23/2020 1202   BILITOT 0.53 09/09/2013 1102   GFRNONAA 80 01/23/2020 1202   GFRAA 92 01/23/2020 1202       Component Value Date/Time   WBC 7.7 01/23/2020 1202   WBC 4.9 12/30/2019 0658   RBC 4.47 01/23/2020 1202   RBC 4.19 12/30/2019 0658   HGB 12.7 01/23/2020 1202   HGB 13.9 09/09/2013 1102   HCT 39.5 01/23/2020 1202   HCT 40.9 09/09/2013 1102   PLT 332 01/23/2020 1202   MCV 88 01/23/2020 1202   MCV 89.1 09/09/2013 1102   MCH 28.4 01/23/2020 1202   MCH 28.2 12/30/2019 0658   MCHC 32.2 01/23/2020 1202   MCHC 31.9 12/30/2019 0658   RDW 13.5 01/23/2020 1202   RDW 13.6 09/09/2013 1102   LYMPHSABS 3.1 01/23/2020 1202   LYMPHSABS 3.2 09/09/2013 1102   MONOABS 0.3 12/30/2019 0658   MONOABS 0.5 09/09/2013 1102   EOSABS 0.4 01/23/2020 1202   BASOSABS 0.1 01/23/2020 1202   BASOSABS 0.1 09/09/2013 1102    No results found for: POCLITH, LITHIUM   No results found for: PHENYTOIN, PHENOBARB, VALPROATE, CBMZ   .res Assessment: Plan:    Deonne was seen today for follow-up and anxiety.  Diagnoses and all orders for this visit:  Generalized anxiety disorder -     risperiDONE (RISPERDAL) 1 MG tablet; 1/2 tablet nightly for 1 week, then 1 nightly  Panic disorder with agoraphobia -     risperiDONE (RISPERDAL) 1 MG tablet; 1/2 tablet nightly for 1 week, then 1 nightly  Insomnia due to mental condition -     risperiDONE (RISPERDAL) 1 MG tablet; 1/2 tablet nightly for 1 week, then 1 nightly  Anxiety is no better than last time with partial response  to current meds.  Patient is also on opiates.  Disc risk with Xanax.  We discussed the short-term risks associated with benzodiazepines including sedation and increased fall risk among others.  Discussed long-term side effect risk including dependence, potential withdrawal symptoms, and the potential eventual dose-related risk of dementia.  But recent studies from 2020 dispute this association between benzodiazepines and dementia risk. Newer studies in 2020 do not support an association with dementia. Continue alprazolam XR 0.5 mg twice daily and citalopram 40 mg daily for now.   Options risperidone off label, switch citalopram to Viibryd, Luvox, off label  Trileptal, venlafaxine, higher than 40 citalopram but would have to get EKG. Disc SE each option.  She wants to start with the safest easiest option.  She prefers rispieridone 0.5 mg HS for a week then 1 tablet nightly if needed. Discussed potential metabolic side effects associated with atypical antipsychotics, as well as potential risk for movement side effects. Advised pt to contact office if movement side effects occur.   FU 6 weeks  Lynder Parents, MD, DFAPA   Please see After Visit Summary for patient specific instructions.  Future Appointments  Date Time Provider [place]  06/16/2020  4:00 PM Princess Bruins, MD [facility]  06/22/2020  3:00 PM [facility]PR[facility]ER [facility] None    No orders of the defined types were placed in this encounter.   -------------------------------

## 2020-05-06 DIAGNOSIS — Z79899 Other long term (current) drug therapy: Secondary | ICD-10-CM | POA: Diagnosis not present

## 2020-05-06 DIAGNOSIS — G894 Chronic pain syndrome: Secondary | ICD-10-CM | POA: Diagnosis not present

## 2020-05-06 DIAGNOSIS — R1084 Generalized abdominal pain: Secondary | ICD-10-CM | POA: Diagnosis not present

## 2020-05-06 DIAGNOSIS — Z5181 Encounter for therapeutic drug level monitoring: Secondary | ICD-10-CM | POA: Diagnosis not present

## 2020-05-06 DIAGNOSIS — M542 Cervicalgia: Secondary | ICD-10-CM | POA: Diagnosis not present

## 2020-05-06 DIAGNOSIS — M5030 Other cervical disc degeneration, unspecified cervical region: Secondary | ICD-10-CM | POA: Diagnosis not present

## 2020-05-18 DIAGNOSIS — D649 Anemia, unspecified: Secondary | ICD-10-CM | POA: Diagnosis not present

## 2020-05-18 DIAGNOSIS — E039 Hypothyroidism, unspecified: Secondary | ICD-10-CM | POA: Diagnosis not present

## 2020-05-20 ENCOUNTER — Other Ambulatory Visit: Payer: Self-pay | Admitting: Psychiatry

## 2020-05-20 DIAGNOSIS — F4001 Agoraphobia with panic disorder: Secondary | ICD-10-CM

## 2020-05-20 DIAGNOSIS — F5105 Insomnia due to other mental disorder: Secondary | ICD-10-CM

## 2020-05-20 DIAGNOSIS — F411 Generalized anxiety disorder: Secondary | ICD-10-CM

## 2020-05-20 NOTE — Telephone Encounter (Signed)
review 

## 2020-05-27 MED ORDER — ALPRAZOLAM 1 MG PO TABS: ORAL_TABLET | ORAL | 0 refills | Status: DC

## 2020-05-27 NOTE — Addendum Note (Signed)
Addended by: Noberto Retort C on: 05/27/2020 11:51 AM   Modules accepted: Orders

## 2020-05-27 NOTE — Telephone Encounter (Signed)
I have spoken to the patient and reviewed her medication allergy list with her. Xanax 1mg , per MRI protocol, will be sent to Dr. Krista Blue for approval. The patient verbalized understanding of the directions and that she must have a driver that day. She also takes Xanax XR 0.5mg  at bedtime some nights. She will withhold this medication the day of the MRI.

## 2020-05-27 NOTE — Telephone Encounter (Signed)
No to the covid questions. Patient is scheduled at [facility] for 06/08/20. Patient informed me she is claustrophobic and would need something to help her.. she is aware to have a driver.

## 2020-06-08 ENCOUNTER — Ambulatory Visit: Payer: BC Managed Care – PPO

## 2020-06-08 DIAGNOSIS — R4189 Other symptoms and signs involving cognitive functions and awareness: Secondary | ICD-10-CM

## 2020-06-08 MED ORDER — GADOBENATE DIMEGLUMINE 529 MG/ML IV SOLN
15.0000 mL | Freq: Once | INTRAVENOUS | Status: AC | PRN
Start: 2020-06-08 — End: 2020-06-08
  Administered 2020-06-08: 15 mL via INTRAVENOUS

## 2020-06-16 ENCOUNTER — Other Ambulatory Visit: Payer: Self-pay

## 2020-06-16 ENCOUNTER — Encounter: Payer: Self-pay | Admitting: Obstetrics & Gynecology

## 2020-06-16 ENCOUNTER — Ambulatory Visit: Payer: BC Managed Care – PPO | Admitting: Obstetrics & Gynecology

## 2020-06-16 VITALS — BP 130/84 | Ht 61.0 in | Wt 195.0 lb

## 2020-06-16 DIAGNOSIS — Z8742 Personal history of other diseases of the female genital tract: Secondary | ICD-10-CM

## 2020-06-16 DIAGNOSIS — Z3041 Encounter for surveillance of contraceptive pills: Secondary | ICD-10-CM

## 2020-06-16 DIAGNOSIS — E6609 Other obesity due to excess calories: Secondary | ICD-10-CM

## 2020-06-16 DIAGNOSIS — Z01419 Encounter for gynecological examination (general) (routine) without abnormal findings: Secondary | ICD-10-CM | POA: Diagnosis not present

## 2020-06-16 DIAGNOSIS — Z6836 Body mass index (BMI) 36.0-36.9, adult: Secondary | ICD-10-CM

## 2020-06-16 MED ORDER — NORETHIN ACE-ETH ESTRAD-FE 1-20 MG-MCG PO TABS: 1.0000 | ORAL_TABLET | Freq: Every day | ORAL | 5 refills | Status: DC

## 2020-06-16 NOTE — Progress Notes (Signed)
[name] 31-Jan-1971 [id number]   History:    49 y.o. G0 Single/Virgin  RP:  Established patient presenting for annual gyn exam   HPI:  On the Progestin-only pill which is not controlling patient's menometrorrhagia/dysmenorrhea/H/O Endometriosis. Had severe Covid in 12/2018 and Junel Fe 1/20 was stopped. Would like to start back on it now.  No pelvic pain.  [place].  PT for IC.  BMs wnl. Breasts normal.  BMI 36.84.  Not exercising regularly.  Fam MD for Health labs.  Past medical history,surgical history, family history and social history were all reviewed and documented in the EPIC chart.  Gynecologic History Patient's last menstrual period was 06/14/2020.  Obstetric History OB History  Gravida Para Term Preterm AB Living  0            SAB TAB Ectopic Multiple Live Births                ROS: A ROS was performed and pertinent positives and negatives are included in the history.  GENERAL: No fevers or chills. HEENT: No change in vision, no earache, sore throat or sinus congestion. NECK: No pain or stiffness. CARDIOVASCULAR: No chest pain or pressure. No palpitations. PULMONARY: No shortness of breath, cough or wheeze. GASTROINTESTINAL: No abdominal pain, nausea, vomiting or diarrhea, melena or bright red blood per rectum. GENITOURINARY: No urinary frequency, urgency, hesitancy or dysuria. MUSCULOSKELETAL: No joint or muscle pain, no back pain, no recent trauma. DERMATOLOGIC: No rash, no itching, no lesions. ENDOCRINE: No polyuria, polydipsia, no heat or cold intolerance. No recent change in weight. HEMATOLOGICAL: No anemia or easy bruising or bleeding. NEUROLOGIC: No headache, seizures, numbness, tingling or weakness. PSYCHIATRIC: No depression, no loss of interest in normal activity or change in sleep pattern.     Exam:   BP 130/84   Ht 5\' 1"  (1.549 m)   Wt 195 lb (88.5 kg)   LMP 06/14/2020   BMI 36.84 kg/m   Body mass index is 36.84 kg/m.  General appearance : Well  developed well nourished female. No acute distress HEENT: Eyes: no retinal hemorrhage or exudates,  Neck supple, trachea midline, no carotid bruits, no thyroidmegaly Lungs: Clear to auscultation, no rhonchi or wheezes, or rib retractions  Heart: Regular rate and rhythm, no murmurs or gallops Breast:Examined in sitting and supine position were symmetrical in appearance, no palpable masses or tenderness,  no skin retraction, no nipple inversion, no nipple discharge, no skin discoloration, no axillary or supraclavicular lymphadenopathy Abdomen: no palpable masses or tenderness, no rebound or guarding Extremities: no edema or skin discoloration or tenderness  Pelvic: Vulva: Normal             Vagina: No gross lesions or discharge  Cervix: No gross lesions or discharge.  Pap reflex done.  Uterus  AV, normal size, shape and consistency, non-tender and mobile  Adnexa  Without masses or tenderness  Anus: Normal   Assessment/Plan:  49 y.o. female for annual exam   1. Encounter for routine gynecological examination with Papanicolaou smear of cervix Normal gynecologic exam.  Pap reflex done.  Breast exam normal.  Screening mammogram June 2021 was negative.  Health labs with family physician.  2. Encounter for surveillance of contraceptive pills Restart on the generic of Loestrin FE 1/20.  No contraindication.  Prescription sent to pharmacy.  3. History of endometriosis History of endometriosis and menometrorrhagia with dysmenorrhea.  We will therefore use the generic of Loestrin FE 1/20 continuously to avoid manses.  4. Class 2 obesity due to excess calories without serious comorbidity with body mass index (BMI) of 36.0 to 36.9 in adult Recommend a lower calorie/carb diet.  Aerobic activities 5 times a week and light weightlifting every 2 days.  Other orders - norethindrone-ethinyl estradiol (LOESTRIN FE) 1-20 MG-MCG tablet; Take 1 tablet by mouth daily. Continuous use for  Menorrhagia/Dysmenorrhea  Princess Bruins MD, 4:16 PM 06/16/2020

## 2020-06-17 DIAGNOSIS — Z01419 Encounter for gynecological examination (general) (routine) without abnormal findings: Secondary | ICD-10-CM | POA: Diagnosis not present

## 2020-06-20 ENCOUNTER — Encounter: Payer: Self-pay | Admitting: Obstetrics & Gynecology

## 2020-06-21 LAB — PAP IG W/ RFLX HPV ASCU

## 2020-06-22 ENCOUNTER — Ambulatory Visit: Payer: BC Managed Care – PPO | Admitting: Nurse Practitioner

## 2020-06-22 VITALS — BP 126/88 | HR 88 | Temp 97.1°F | Ht 61.0 in | Wt 194.0 lb

## 2020-06-22 DIAGNOSIS — R5383 Other fatigue: Secondary | ICD-10-CM | POA: Diagnosis not present

## 2020-06-22 DIAGNOSIS — R5381 Other malaise: Secondary | ICD-10-CM

## 2020-06-22 DIAGNOSIS — Z8616 Personal history of COVID-19: Secondary | ICD-10-CM

## 2020-06-22 DIAGNOSIS — R0600 Dyspnea, unspecified: Secondary | ICD-10-CM | POA: Diagnosis not present

## 2020-06-22 DIAGNOSIS — R0609 Other forms of dyspnea: Secondary | ICD-10-CM

## 2020-06-22 NOTE — Patient Instructions (Signed)
History of COVID-19 Covid Pneumonia:  Most recent chest xray showed improvement  Continue inhalers as per pulmonary  Keep follow up with Dr. Chase Caller    Memory loss:  Please keep follow up with neurology   Anxiety:  Please follow up with psychiatry   Tachycardia:  Please keep follow up with cardiology   Hair Loss:  A few months after having Covid many people see hair loss 2-3 months after the virus and this can last 6-9 months - this is common   Fatigue:  Will place referral for PT    Follow up:  Follow up in 3 months or sooner if needed

## 2020-06-22 NOTE — Progress Notes (Signed)
@Patient  ID: [name], female    DOB: November 02, 1970, 49 y.o.   MRN: [id number]  Chief Complaint  Patient presents with  . Follow-up    Feeling better, seeing Pulmonary and Psychiatry. Hair is growing back still having fatigue    Referring provider: Deland Pretty, MD   49 year ols female with history of hypertension, asthma, GERD, IBS, hypothyroidism, anxiety, hyperlipidemia, and obesity. Diagnosed with Covid on 12/20/19.   HPI  Patient presents today for post COVID care clinic visit follow-up.  Patient states that since her last visit she has been improving.  Patient has followed up with pulmonary but states that she was told she needed a sleep study and has not heard back from them on this.  She did have a PFT which overall was normal.  She did follow-up with neurology and had MRI which was overall normal.  She is scheduled to follow-up with psychiatry tomorrow for ongoing posttraumatic stress related to hospitalization with Covid.  She states that her hair loss is improving and starting to grow back.  Patient states that she is still trying to regain her strength and is still having extreme fatigue.  We discussed that we can get her set up with our physical therapy program to help with this. Denies f/c/s, n/v/d, hemoptysis, PND,  chest pain or edema.       Allergies  Allergen Reactions  . Allegra [Fexofenadine Hcl] Shortness Of Breath and Rash  . Fexofenadine Shortness Of Breath, Rash and Other (See Comments)    Shakes, can't sleep, trouble breathing  . Oseltamivir Other (See Comments)    Can't breathe, sick to stomach  . Tamiflu [Oseltamivir Phosphate] Itching, Nausea And Vomiting and Other (See Comments)    Can't breathe, sick to stomach  . Vancomycin Other (See Comments)    Red man's Syndrome.  . Diphenhydramine Other (See Comments)    Shakes, can't sleep, trouble breathing  . Latex Itching, Dermatitis and Rash  . Sulfamethoxazole Rash  . Aspirin Nausea And Vomiting  .  Doxycycline   . Other     NUT ALLERGY  . Diphenhydramine Hcl Palpitations and Rash  . Morphine Other (See Comments)    REACTION: insomnia  . Oseltamivir Phosphate Itching and Nausea And Vomiting  . Penicillins Itching and Swelling    Can tolerate Amoxicillin OK.  . Sulfonamide Derivatives Rash    Immunization History  Administered Date(s) Administered  . Influenza,inj,Quad PF,6+ Mos 06/16/2019  . Pneumococcal Polysaccharide-23 02/20/2012  . Tdap 02/20/2012    Past Medical History:  Diagnosis Date  . Anxiety   . Arrhythmia    heart races when pt in pain  . Asthma   . Clostridium difficile infection 2014  . [place] Patient virus infection   . Fibromyalgia   . GERD (gastroesophageal reflux disease)   . History of COVID-19   . Hypertension   . Hypothyroidism   . Immune deficiency disorder ([place])   . Lyme disease   . Mold contact confirmed    Toxic Mold syndrome  . Mononucleosis 5-10  . Ovarian cyst    Serous Cystadenofibroma-Left ovary  . PONV (postoperative nausea and vomiting)   . Shortness of breath    on exertion  . Systolic murmur   . Thyroid disease     Tobacco History: Social History   Tobacco Use  Smoking Status Never Smoker  Smokeless Tobacco Never Used   Counseling given: Not Answered   Outpatient Encounter Medications as of 06/22/2020  Medication Sig  .  albuterol (PROAIR HFA) 108 (90 Base) MCG/ACT inhaler Inhale 2 puffs into the lungs every 6 (six) hours as needed for wheezing or shortness of breath.  . ALPRAZolam (XANAX XR) 0.5 MG 24 hr tablet Take 0.5 mg by mouth in the morning and at bedtime.  . ALPRAZolam (XANAX) 1 MG tablet Take 1-2 tablets thirty minutes prior to MRI.  May take one additional tablet before entering scanner, if needed.  MUST HAVE DRIVER.  Marland Kitchen amLODipine (NORVASC) 2.5 MG tablet Take 1 tablet (2.5 mg total) by mouth daily. NEED OV.  . baclofen (LIORESAL) 20 MG tablet Take 20 mg by mouth as needed.  . beclomethasone (QVAR REDIHALER)  40 MCG/ACT inhaler Inhale 2 puffs into the lungs 2 (two) times daily.  . Buprenorphine HCl (BELBUCA) 450 MCG FILM Place 1 Film inside cheek in the morning and at bedtime.  . cetirizine (ZYRTEC) 10 MG tablet Take 10 mg by mouth daily.   . Cholecalciferol (VITAMIN D PO) Take 1 tablet by mouth daily.   . citalopram (CELEXA) 40 MG tablet Take 40 mg by mouth daily.  . fluticasone (FLONASE) 50 MCG/ACT nasal spray Place 1 spray into both nostrils daily.  . hydrOXYzine (ATARAX/VISTARIL) 25 MG tablet Take 25 mg by mouth as needed for anxiety.   . Hyoscyamine Sulfate SL 0.125 MG SUBL Take 0.125 mg by mouth every 4 (four) hours as needed for cramping.  Marland Kitchen ibuprofen (ADVIL) 200 MG tablet Take by mouth.  . Melatonin 10 MG TABS Take 10 mg by mouth at bedtime.   . Multiple Vitamin (MULTI-VITAMIN) tablet Take by mouth.  . norethindrone-ethinyl estradiol (LOESTRIN FE) 1-20 MG-MCG tablet Take 1 tablet by mouth daily. Continuous use for Menorrhagia/Dysmenorrhea  . nystatin (MYCOSTATIN/NYSTOP) powder Apply 1 application topically 2 (two) times daily.  Marland Kitchen POLY-IRON 150 150 MG capsule Take 300 mg by mouth daily.  . Probiotic Product (ALIGN PO) Take 1 capsule by mouth daily.   . prochlorperazine (COMPAZINE) 10 MG tablet Take 10 mg by mouth every 6 (six) hours as needed for nausea or vomiting.   . promethazine (PHENERGAN) 25 MG tablet Take 25 mg by mouth every 6 (six) hours as needed for nausea or vomiting.   . ranitidine (ZANTAC) 150 MG tablet Take by mouth.  Marland Kitchen rOPINIRole (REQUIP) 2 MG tablet Take 2 mg by mouth at bedtime.   Marland Kitchen Spacer/Aero-Holding Chambers (AEROCHAMBER MV) inhaler Use as instructed  . thyroid (ARMOUR) 90 MG tablet Take 180 mg by mouth daily. NP Thyroid  . valACYclovir (VALTREX) 1000 MG tablet Take by mouth.  . vitamin B-12 (CYANOCOBALAMIN) 500 MCG tablet Take 500 mcg by mouth daily.  . bisoprolol (ZEBETA) 10 MG tablet Take 10 mg by mouth daily. (Patient not taking: Reported on 06/22/2020)  .  risperiDONE (RISPERDAL) 1 MG tablet TAKE 1/2 TABLET BY MOUTH NIGHTLY FOR 1 WEEK THEN 1 TAB NIGHTLY (Patient not taking: Reported on 06/22/2020)   No facility-administered encounter medications on file as of 06/22/2020.     Review of Systems  Review of Systems  Constitutional: Positive for fatigue. Negative for fever.  HENT: Negative.   Respiratory: Positive for shortness of breath. Negative for cough.   Cardiovascular: Negative.  Negative for chest pain, palpitations and leg swelling.  Gastrointestinal: Negative.   Allergic/Immunologic: Negative.   Neurological: Positive for weakness.  Psychiatric/Behavioral: Positive for sleep disturbance. The patient is nervous/anxious.        Physical Exam  BP 126/88 (BP Location: Left Arm)   Pulse 88   Temp Marland Kitchen)  97.1 F (36.2 C)   Ht 5\' 1"  (1.549 m)   Wt 194 lb (88 kg)   LMP 06/14/2020   SpO2 98%   BMI 36.66 kg/m   Wt Readings from Last 5 Encounters:  06/22/20 194 lb (88 kg)  06/16/20 195 lb (88.5 kg)  04/15/20 198 lb 3.2 oz (89.9 kg)  03/22/20 202 lb 0.1 oz (91.6 kg)  03/04/20 201 lb (91.2 kg)     Physical Exam Vitals and nursing note reviewed.  Constitutional:      General: She is not in acute distress.    Appearance: She is well-developed.  Cardiovascular:     Rate and Rhythm: Normal rate and regular rhythm.  Pulmonary:     Effort: Pulmonary effort is normal.     Breath sounds: Normal breath sounds.  Musculoskeletal:     Right lower leg: No edema.     Left lower leg: No edema.  Neurological:     Mental Status: She is alert and oriented to person, place, and time.  Psychiatric:        Mood and Affect: Mood normal.        Behavior: Behavior normal.      Imaging: MR BRAIN W WO CONTRAST  Result Date: 06/10/2020 [facility] NEUROIMAGING REPORT STUDY DATE: 06/08/20 PATIENT NAME: NAOMY ESHAM DOB: 1971/06/07 MRN: [id number] ORDERING CLINICIAN: Marcial Pacas, MD CLINICAL HISTORY: 49 year old female  with brain fog. EXAM: MR BRAIN W WO CONTRAST TECHNIQUE: MRI of the brain without contrast was obtained utilizing 5 mm axial slices with T1, T2, T2 flair, T2 star gradient echo and diffusion weighted views.  T1 sagittal and T2 coronal views were obtained. CONTRAST: 6ml multihance COMPARISON: none IMAGING SITE: [facility] [address] (1.5 Tesla MRI) FINDINGS: No abnormal lesions are seen on diffusion-weighted views to suggest acute ischemia. The cortical sulci, fissures and cisterns are normal in size and appearance. Lateral, third and fourth ventricle are normal in size and appearance. No extra-axial fluid collections are seen. No evidence of mass effect or midline shift.  No abnormal lesions on post-contrast views. On sagittal views the posterior fossa, pituitary gland and corpus callosum are unremarkable. No evidence of intracranial hemorrhage on gradient echo views. The orbits and their contents, paranasal sinuses and calvarium are notable for mucus thickening in the right ethmoid and maxillary sinuses. Intracranial flow voids are present.   Normal MRI brain (with and without). INTERPRETING PHYSICIAN: Penni Bombard, MD Certified in Neurology, Neurophysiology and Neuroimaging [facility] [address] [place], [place] [address] [phone]     Assessment & Plan:   History of COVID-19 Covid Pneumonia:  Most recent chest xray showed improvement  Continue inhalers as per pulmonary  Keep follow up with Dr. Chase Caller    Memory loss:  Please keep follow up with neurology   Anxiety:  Please follow up with psychiatry   Tachycardia:  Please keep follow up with cardiology   Hair Loss:  A few months after having Covid many people see hair loss 2-3 months after the virus and this can last 6-9 months - this is common   Fatigue:  Will place referral for PT    Follow up:  Follow up in 3 months or sooner if  needed      [name], NP 06/23/2020

## 2020-06-23 ENCOUNTER — Other Ambulatory Visit: Payer: Self-pay

## 2020-06-23 ENCOUNTER — Ambulatory Visit (INDEPENDENT_AMBULATORY_CARE_PROVIDER_SITE_OTHER): Payer: BC Managed Care – PPO | Admitting: Psychiatry

## 2020-06-23 ENCOUNTER — Encounter: Payer: Self-pay | Admitting: Psychiatry

## 2020-06-23 DIAGNOSIS — F4001 Agoraphobia with panic disorder: Secondary | ICD-10-CM

## 2020-06-23 DIAGNOSIS — F411 Generalized anxiety disorder: Secondary | ICD-10-CM

## 2020-06-23 DIAGNOSIS — F5105 Insomnia due to other mental disorder: Secondary | ICD-10-CM

## 2020-06-23 LAB — SARS-COV-2 SEMI-QUANTITATIVE TOTAL ANTIBODY, SPIKE
SARS-CoV-2 Semi-Quant Total Ab: 636.8 U/mL (ref <0.8)
SARS-CoV-2 Spike Ab Interp: POSITIVE

## 2020-06-23 MED ORDER — GABAPENTIN 800 MG PO TABS: ORAL_TABLET | ORAL | 1 refills | Status: DC

## 2020-06-23 NOTE — Patient Instructions (Signed)
Gabapentin 800 mg tablets: 1/2 tablet 4 times for 1 week then 1 tablet 3 times daily for 1 week then 1 tablet 4 times daily.

## 2020-06-23 NOTE — Assessment & Plan Note (Signed)
Covid Pneumonia:  Most recent chest xray showed improvement  Continue inhalers as per pulmonary  Keep follow up with Dr. Chase Caller    Memory loss:  Please keep follow up with neurology   Anxiety:  Please follow up with psychiatry   Tachycardia:  Please keep follow up with cardiology   Hair Loss:  A few months after having Covid many people see hair loss 2-3 months after the virus and this can last 6-9 months - this is common   Fatigue:  Will place referral for PT    Follow up:  Follow up in 3 months or sooner if needed

## 2020-06-23 NOTE — Progress Notes (Signed)
[name] [id number] [birth year] 49 y.o.  Subjective:   Patient ID:  [name] is a 49 y.o. (DOB 08-02-1971) female.  Chief Complaint:  Chief Complaint  Patient presents with  . Follow-up  . Anxiety  . Depression    HPI [name] presents to Beth office today for follow-up of panic disorder, generalized anxiety, and insomnia.  First seen December 02, 2019. Referred by Dr. Deland Rush.  She was on alprazolam XR 0.5 mg twice daily and citalopram 40 mg daily along with opiates at Beth time. Buspirone was initiated.  04/21/2020 appointment with Beth following noted: Covid and hosp for a week 12/26/19.  Got Remdesivir.  Finally recovering.  Still tired and aches. Steroids helped chronic pain from Lyme dz but relapsed off steroids. On opiates Bel[place] still.  Still on Xanax XR 0.5 mg BID, citalopram 40 mg daily.  Off buspirone bc HA and shakey after a week. Still anxious about Covid.  Has to push herself to leave Beth house.  No full panic but panicky feelings. Still sees therapist [name]. Settled worker's comp case from 10 years ago. Celexa does best of SSRI she's taken but not controlled. Normal QT interval now. Trouble with sleep sometimes. Plan: She prefers rispieridone 0.5 mg HS for a week then 1 tablet nightly if needed.  06/23/2020 appointment with Beth following noted: Too foggy and slowed down on risperidone 0.5 mg after a week's trial. So stopped it. Went to Post-Covid clinic yesterday with good antibodies.   Shouldn't worry over covid but still does.  Life in limbo after settled worker's comp. Exercising, pray, writing and therapy with [facility]. Can awaken with panic every 2-3 days.  Distracts herself. Awoke at 2AM with panic with fear of not breathing.  Anxiety driving.   Chronic fatigue and sleepy before and worse after post Covid   Past Psychiatric History: therapist [name]. [name] Past Psychiatric Medication Trials:  Zoloft SE HA and itching. Paxil wt gain 30# and fatigue. Fluoxetine NR Lexapro  not as good as Celexa Gabapentin NR pain midrange Gabatril for sleep 16 mg  Alprazolam 1 mg BID,  Cymbalta SE dryness dental problems, no pain benefit Buspirone SE HA  Risperidone SE  Review of Systems:  Review of Systems  Constitutional: Positive for fatigue.  Respiratory: Positive for shortness of breath.   Musculoskeletal: Positive for myalgias.  Neurological: Positive for weakness. Negative for tremors.    Medications: I have reviewed Beth patient's current medications.  Current Outpatient Medications  Medication Sig Dispense Refill  . albuterol (PROAIR HFA) 108 (90 Base) MCG/ACT inhaler Inhale 2 puffs into Beth lungs every 6 (six) hours as needed for wheezing or shortness of breath. 18 g 3  . ALPRAZolam (XANAX XR) 0.5 MG 24 hr tablet Take 0.5 mg by mouth in Beth morning and at bedtime.    . ALPRAZolam (XANAX) 1 MG tablet Take 1-2 tablets thirty minutes prior to MRI.  May take one additional tablet before entering scanner, if needed.  MUST HAVE DRIVER. 3 tablet 0  . amLODipine (NORVASC) 2.5 MG tablet Take 1 tablet (2.5 mg total) by mouth daily. NEED OV. 90 tablet 3  . baclofen (LIORESAL) 20 MG tablet Take 20 mg by mouth as needed.    . beclomethasone (QVAR REDIHALER) 40 MCG/ACT inhaler Inhale 2 puffs into Beth lungs 2 (two) times daily. 10.6 g 5  . bisoprolol (ZEBETA) 10 MG tablet Take 10 mg by mouth daily.     Marland Kitchen  Buprenorphine HCl (BELBUCA) 450 MCG FILM Place 1 Film inside cheek in Beth morning and at bedtime.    . cetirizine (ZYRTEC) 10 MG tablet Take 10 mg by mouth daily.     . Cholecalciferol (VITAMIN D PO) Take 1 tablet by mouth daily.     . citalopram (CELEXA) 40 MG tablet Take 40 mg by mouth daily.    . fluticasone (FLONASE) 50 MCG/ACT nasal spray Place 1 spray into both nostrils daily.    . hydrOXYzine (ATARAX/VISTARIL) 25 MG tablet Take 25 mg by mouth as needed for anxiety.     . Hyoscyamine  Sulfate SL 0.125 MG SUBL Take 0.125 mg by mouth every 4 (four) hours as needed for cramping.    Marland Kitchen ibuprofen (ADVIL) 200 MG tablet Take by mouth.    . Melatonin 10 MG TABS Take 10 mg by mouth at bedtime.     . Multiple Vitamin (MULTI-VITAMIN) tablet Take by mouth.    . norethindrone-ethinyl estradiol (LOESTRIN FE) 1-20 MG-MCG tablet Take 1 tablet by mouth daily. Continuous use for Menorrhagia/Dysmenorrhea 84 tablet 5  . nystatin (MYCOSTATIN/NYSTOP) powder Apply 1 application topically 2 (two) times daily. 15 g 3  . POLY-IRON 150 150 MG capsule Take 300 mg by mouth daily.    . Probiotic Product (ALIGN PO) Take 1 capsule by mouth daily.     . prochlorperazine (COMPAZINE) 10 MG tablet Take 10 mg by mouth every 6 (six) hours as needed for nausea or vomiting.     . promethazine (PHENERGAN) 25 MG tablet Take 25 mg by mouth every 6 (six) hours as needed for nausea or vomiting.     . ranitidine (ZANTAC) 150 MG tablet Take by mouth.    Marland Kitchen rOPINIRole (REQUIP) 2 MG tablet Take 2 mg by mouth at bedtime.     Marland Kitchen Spacer/Aero-Holding Chambers (AEROCHAMBER MV) inhaler Use as instructed 1 each 0  . thyroid (ARMOUR) 90 MG tablet Take 180 mg by mouth daily. NP Thyroid    . valACYclovir (VALTREX) 1000 MG tablet Take by mouth.    . vitamin B-12 (CYANOCOBALAMIN) 500 MCG tablet Take 500 mcg by mouth daily.    . risperiDONE (RISPERDAL) 1 MG tablet TAKE 1/2 TABLET BY MOUTH NIGHTLY FOR 1 WEEK THEN 1 TAB NIGHTLY (Patient not taking: Reported on 06/22/2020) 90 tablet 1   No current facility-administered medications for this visit.    Medication Side Effects: None  Allergies:  Allergies  Allergen Reactions  . Allegra [Fexofenadine Hcl] Shortness Of Breath and Rash  . Fexofenadine Shortness Of Breath, Rash and Other (See Comments)    Shakes, can't sleep, trouble breathing  . Oseltamivir Other (See Comments)    Can't breathe, sick to stomach  . Tamiflu [Oseltamivir Phosphate] Itching, Nausea And Vomiting and Other (See  Comments)    Can't breathe, sick to stomach  . Vancomycin Other (See Comments)    Red man's Syndrome.  . Diphenhydramine Other (See Comments)    Shakes, can't sleep, trouble breathing  . Latex Itching, Dermatitis and Rash  . Sulfamethoxazole Rash  . Aspirin Nausea And Vomiting  . Doxycycline   . Other     NUT ALLERGY  . Diphenhydramine Hcl Palpitations and Rash  . Morphine Other (See Comments)    REACTION: insomnia  . Oseltamivir Phosphate Itching and Nausea And Vomiting  . Penicillins Itching and Swelling    Can tolerate Amoxicillin OK.  . Sulfonamide Derivatives Rash    Past Medical History:  Diagnosis Date  . Anxiety   .  Arrhythmia    heart races when pt in pain  . Asthma   . Clostridium difficile infection 2014  . [facility] Patient virus infection   . Fibromyalgia   . GERD (gastroesophageal reflux disease)   . History of COVID-19   . Hypertension   . Hypothyroidism   . Immune deficiency disorder ([place])   . Lyme disease   . Mold contact confirmed    Toxic Mold syndrome  . Mononucleosis 5-10  . Ovarian cyst    Serous Cystadenofibroma-Left ovary  . PONV (postoperative nausea and vomiting)   . Shortness of breath    on exertion  . Systolic murmur   . Thyroid disease     Family History  Problem Relation Age of Onset  . Hypertension Mother   . Diabetes Mother   . Pulmonary embolism Mother        x2  . Heart disease Mother   . Breast cancer Maternal Aunt        Age 92  . Pancreatic cancer Maternal Grandmother   . Heart disease Maternal Grandfather   . Breast cancer Paternal Grandmother        Age 60  . Asthma Paternal Grandmother   . Hypertension Father   . Thyroid disease Father     Social History   Socioeconomic History  . Marital status: Single    Spouse name: Not on file  . Number of children: 0  . Years of education: college  . Highest education level: Not on file  Occupational History  . Occupation: Pharmacist, hospital  Tobacco Use  . Smoking status:  Never Smoker  . Smokeless tobacco: Never Used  Vaping Use  . Vaping Use: Never used  Substance and Sexual Activity  . Alcohol use: No    Alcohol/week: 0.0 standard drinks  . Drug use: No  . Sexual activity: Never    Birth control/protection: Pill  Other Topics Concern  . Not on file  Social History Narrative   Lives at home alone.   Right-handed.   Caffeine use: 1 cup per day.   Social Determinants of Health   Financial Resource Strain:   . Difficulty of Paying Living Expenses: Not on file  Food Insecurity:   . Worried About Charity fundraiser in [place] Last Year: Not on file  . Ran Out of Food in Beth Last Year: Not on file  Transportation Needs:   . Lack of Transportation (Medical): Not on file  . Lack of Transportation (Non-Medical): Not on file  Physical Activity:   . Days of Exercise per Week: Not on file  . Minutes of Exercise per Session: Not on file  Stress:   . Feeling of Stress : Not on file  Social Connections:   . Frequency of Communication with Friends and Family: Not on file  . Frequency of Social Gatherings with Friends and Family: Not on file  . Attends Religious Services: Not on file  . Active Member of Clubs or Organizations: Not on file  . Attends Archivist Meetings: Not on file  . Marital Status: Not on file  Intimate Partner Violence:   . Fear of Current or Ex-Partner: Not on file  . Emotionally Abused: Not on file  . Physically Abused: Not on file  . Sexually Abused: Not on file    Past Medical History, Surgical history, Social history, and Family history were reviewed and updated as appropriate.   Please see review of systems for further details on Beth patient's  review from today.   Objective:   Physical Exam:  LMP 06/14/2020   Physical Exam Constitutional:      General: She is not in acute distress. Musculoskeletal:        General: No deformity.  Neurological:     Mental Status: She is alert and oriented to person, place,  and time.     Coordination: Coordination normal.  Psychiatric:        Attention and Perception: Attention and perception normal. She does not perceive auditory or visual hallucinations.        Mood and Affect: Mood is anxious. Mood is not depressed. Affect is not labile, blunt, angry or inappropriate.        Speech: Speech normal.        Behavior: Behavior normal.        Thought Content: Thought content normal. Thought content is not paranoid or delusional. Thought content does not include homicidal or suicidal ideation. Thought content does not include homicidal or suicidal plan.        Cognition and Memory: Cognition and memory normal.        Judgment: Judgment normal.     Comments: Insight fair     Lab Review:     Component Value Date/Time   NA 140 01/23/2020 1202   NA 142 09/09/2013 1102   K 4.2 01/23/2020 1202   K 4.3 09/09/2013 1102   CL 99 01/23/2020 1202   CO2 20 01/23/2020 1202   CO2 27 09/09/2013 1102   GLUCOSE 105 (H) 01/23/2020 1202   GLUCOSE 135 (H) 12/30/2019 0658   GLUCOSE 95 09/09/2013 1102   BUN 12 01/23/2020 1202   BUN 11.0 09/09/2013 1102   CREATININE 0.87 01/23/2020 1202   CREATININE 0.9 09/09/2013 1102   CALCIUM 9.3 01/23/2020 1202   CALCIUM 9.9 09/09/2013 1102   PROT 7.1 01/23/2020 1202   PROT 7.9 09/09/2013 1102   ALBUMIN 4.2 01/23/2020 1202   ALBUMIN 3.5 09/09/2013 1102   AST 24 01/23/2020 1202   AST 19 09/09/2013 1102   ALT 19 01/23/2020 1202   ALT 18 09/09/2013 1102   ALKPHOS 88 01/23/2020 1202   ALKPHOS 80 09/09/2013 1102   BILITOT 0.2 01/23/2020 1202   BILITOT 0.53 09/09/2013 1102   GFRNONAA 80 01/23/2020 1202   GFRAA 92 01/23/2020 1202       Component Value Date/Time   WBC 7.7 01/23/2020 1202   WBC 4.9 12/30/2019 0658   RBC 4.47 01/23/2020 1202   RBC 4.19 12/30/2019 0658   HGB 12.7 01/23/2020 1202   HGB 13.9 09/09/2013 1102   HCT 39.5 01/23/2020 1202   HCT 40.9 09/09/2013 1102   PLT 332 01/23/2020 1202   MCV 88 01/23/2020 1202    MCV 89.1 09/09/2013 1102   MCH 28.4 01/23/2020 1202   MCH 28.2 12/30/2019 0658   MCHC 32.2 01/23/2020 1202   MCHC 31.9 12/30/2019 0658   RDW 13.5 01/23/2020 1202   RDW 13.6 09/09/2013 1102   LYMPHSABS 3.1 01/23/2020 1202   LYMPHSABS 3.2 09/09/2013 1102   MONOABS 0.3 12/30/2019 0658   MONOABS 0.5 09/09/2013 1102   EOSABS 0.4 01/23/2020 1202   BASOSABS 0.1 01/23/2020 1202   BASOSABS 0.1 09/09/2013 1102    No results found for: POCLITH, LITHIUM   No results found for: PHENYTOIN, PHENOBARB, VALPROATE, CBMZ   .res Assessment: Plan:    Audri was seen today for follow-up, anxiety and depression.  Diagnoses and all orders for this visit:  Generalized anxiety  disorder  Panic disorder with agoraphobia  Insomnia due to mental condition  Anxiety is no better than last time with partial response to current meds. Intolerant of buspirone, and risperidone.  Patient is also on opiates.  Disc risk with Xanax.  We discussed Beth short-term risks associated with benzodiazepines including sedation and increased fall risk among others.  Discussed long-term side effect risk including dependence, potential withdrawal symptoms, and Beth potential eventual dose-related risk of dementia.  But recent studies from 2020 dispute this association between benzodiazepines and dementia risk. Newer studies in 2020 do not support an association with dementia. Continue alprazolam XR 0.5 mg twice daily and citalopram 40 mg daily for now. No plans to increase alprazolam bc pt on opiate.   Options risperidone off label, switch citalopram to Viibryd, Luvox, off label Trileptal, venlafaxine, Abilify, clonidine, retry higher dose gabapentin, higher than 40 citalopram but would have to get EKG. EKG 01/14/20 QTc 512 so cannot increase citalopram. Also hesitant to use TCA for Beth same reason.  Disc SE each option.  She wants to start with Beth safest easiest option.  Retry gabapentin  800 mg tablet 1/2 tablet QID up to 1  tablet QID Call if NR>  Discussed potential metabolic side effects associated with atypical antipsychotics, as well as potential risk for movement side effects. Advised pt to contact office if movement side effects occur.   FU 6 weeks  Lynder Parents, MD, DFAPA   Please see After Visit Summary for patient specific instructions.  Future Appointments  Date Time Provider [place]  06/20/2021  4:00 PM Princess Bruins, MD [facility]    No orders of Beth defined types were placed in this encounter.   -------------------------------

## 2020-06-28 NOTE — Progress Notes (Signed)
Cardiology Office Note   Date:  07/03/2020   ID:  [name], DOB 1971/04/24, MRN [id number]  PCP:  Deland Pretty, MD  Cardiologist:  Sanda Klein, MD EP: None  Chief Complaint  Patient presents with  . Follow-up      History of Present Illness: [name] is a 49 y.o. female with PMH of HTN, HLD, anxiety, fibromyalgia, GERD, hypothyroidism, who presents for routine follow-up.  She was last evaluated by cardiology at an outpatient visit with Dr. Sallyanne Kuster 08/2017, at which time she was doing well from a cardiac standpoint, though had ongoing joint pains from Lyme disease and black mold exposure. She had no cardiac complaints at that time and was encouraged to continue dietary modifications to promote weight loss given limited exercise capacity. She was recommended to follow-up in 1 year, however has not been seen since that time. Her last echocardiogram 02/2020 to evaluate SOB following COVID-19 infection 12/2019 showed EF 60-65%, normal LV diastolic function, no RWMA, normal RV function, and no significant valvular abnormalities.   She presents today for routine follow-up. She has been building up her exercise tolerance since her COVID-19 infection 12/2019. She was initially discharged home on O2 and weaned off. She has been following with behavioral health to manage her anxiety following her hospitalization and feels like they're on the right track of controlling her anxiety without leaving her foggy headed. She reported palpitations with COVID which have been improving and now rarely occurring since starting bisoprolol. She has no complaints of dizziness, lightheadedness, syncope, LE edema, orthopnea, PND, or chest pain.     Past Medical History:  Diagnosis Date  . Anxiety   . Arrhythmia    heart races when pt in pain  . Asthma   . Clostridium difficile infection 2014  . Randell Patient virus infection   . Fibromyalgia   . GERD (gastroesophageal reflux disease)   .  History of COVID-19   . Hypertension   . Hypothyroidism   . Immune deficiency disorder ([place])   . Lyme disease   . Mold contact confirmed    Toxic Mold syndrome  . Mononucleosis 5-10  . Ovarian cyst    Serous Cystadenofibroma-Left ovary  . PONV (postoperative nausea and vomiting)   . Shortness of breath    on exertion  . Systolic murmur   . Thyroid disease     Past Surgical History:  Procedure Laterality Date  . BLADDER SURGERY    . CHOLECYSTECTOMY    . FECAL TRANSPLANT    . Groin cyst    . LAPAROSCOPY Right 06/12/2013   Procedure: LAPAROSCOPY OPERATIVE  fulgeration of endometriosis, right salpingooophorectomy;  Surgeon: Terrance Mass, MD;  Location: [facility];  Service: Gynecology;  Laterality: Right;  . Nose cyst    . OOPHORECTOMY Right 06/12/2013   Procedure: OOPHORECTOMY;POSS RIGHT SALPING OOPHORECTOMY;  Surgeon: Terrance Mass, MD;  Location: [facility];  Service: Gynecology;  Laterality: Right;  . PELVIC LAPAROSCOPY     DL Ovarian cystectomy-Left  . TONSILLECTOMY  2009     Current Outpatient Medications  Medication Sig Dispense Refill  . albuterol (PROAIR HFA) 108 (90 Base) MCG/ACT inhaler Inhale 2 puffs into the lungs every 6 (six) hours as needed for wheezing or shortness of breath. 18 g 3  . ALPRAZolam (XANAX XR) 0.5 MG 24 hr tablet Take 0.5 mg by mouth in the morning and at bedtime.    Marland Kitchen amLODipine (NORVASC) 2.5 MG tablet Take 1 tablet (2.5  mg total) by mouth daily. NEED OV. 90 tablet 3  . baclofen (LIORESAL) 20 MG tablet Take 20 mg by mouth as needed.    . beclomethasone (QVAR REDIHALER) 40 MCG/ACT inhaler Inhale 2 puffs into the lungs 2 (two) times daily. 10.6 g 5  . bisoprolol (ZEBETA) 10 MG tablet Take 10 mg by mouth daily.     . Buprenorphine HCl (BELBUCA) 450 MCG FILM Place 1 Film inside cheek in the morning and at bedtime.    . cetirizine (ZYRTEC) 10 MG tablet Take 10 mg by mouth daily.     . Cholecalciferol (VITAMIN D PO) Take 1 tablet by mouth daily.     .  citalopram (CELEXA) 40 MG tablet Take 40 mg by mouth daily.    Marland Kitchen gabapentin (NEURONTIN) 800 MG tablet 1/2 tablet 4 times for 1 week then 1 tablet 3 times daily for 1 week then 1 tablet 4 times daily. 120 tablet 1  . hydrOXYzine (ATARAX/VISTARIL) 25 MG tablet Take 25 mg by mouth as needed for anxiety.     . Hyoscyamine Sulfate SL 0.125 MG SUBL Take 0.125 mg by mouth every 4 (four) hours as needed for cramping.    Marland Kitchen ibuprofen (ADVIL) 200 MG tablet Take by mouth.    . Melatonin 10 MG TABS Take 10 mg by mouth at bedtime.     . Multiple Vitamin (MULTI-VITAMIN) tablet Take by mouth.    . norethindrone-ethinyl estradiol (LOESTRIN FE) 1-20 MG-MCG tablet Take 1 tablet by mouth daily.    Marland Kitchen POLY-IRON 150 150 MG capsule Take 300 mg by mouth daily.    . Probiotic Product (ALIGN PO) Take 1 capsule by mouth daily.     . prochlorperazine (COMPAZINE) 10 MG tablet Take 10 mg by mouth every 6 (six) hours as needed for nausea or vomiting.     . promethazine (PHENERGAN) 25 MG tablet Take 25 mg by mouth every 6 (six) hours as needed for nausea or vomiting.     . ranitidine (ZANTAC) 150 MG tablet Take by mouth.    Marland Kitchen rOPINIRole (REQUIP) 2 MG tablet Take 2 mg by mouth at bedtime.     Marland Kitchen Spacer/Aero-Holding Chambers (AEROCHAMBER MV) inhaler Use as instructed 1 each 0  . thyroid (ARMOUR) 90 MG tablet Take 120 mg by mouth daily. NP Thyroid    . valACYclovir (VALTREX) 1000 MG tablet Take by mouth.    . vitamin B-12 (CYANOCOBALAMIN) 500 MCG tablet Take 500 mcg by mouth daily.     No current facility-administered medications for this visit.    Allergies:   Allegra [fexofenadine hcl], Fexofenadine, Oseltamivir, Tamiflu [oseltamivir phosphate], Vancomycin, Diphenhydramine, Latex, Sulfamethoxazole, Aspirin, Doxycycline, Other, Diphenhydramine hcl, Morphine, Oseltamivir phosphate, Penicillins, and Sulfonamide derivatives    Social History:  The patient  reports that she has never smoked. She has never used smokeless tobacco.  She reports that she does not drink alcohol and does not use drugs.   Family History:  The patient's family history includes Asthma in her paternal grandmother; Breast cancer in her maternal aunt and paternal grandmother; Diabetes in her mother; Heart disease in her maternal grandfather and mother; Hypertension in her father and mother; Pancreatic cancer in her maternal grandmother; Pulmonary embolism in her mother; Thyroid disease in her father.    ROS:  Please see the history of present illness.   Otherwise, review of systems are positive for none.   All other systems are reviewed and negative.    PHYSICAL EXAM: VS:  BP 118/66  Pulse 83   Ht 5\' 1"  (1.549 m)   Wt 194 lb 9.6 oz (88.3 kg)   LMP 06/14/2020   BMI 36.77 kg/m  , BMI Body mass index is 36.77 kg/m. GEN: Well nourished, well developed, in no acute distress HEENT: sclera anicteric Neck: no JVD, carotid bruits, or masses Cardiac: RRR; +murmur, no rubs or gallops, no edema  Respiratory:  clear to auscultation bilaterally, normal work of breathing GI: soft, nontender, nondistended, + BS MS: no deformity or atrophy Skin: warm and dry, no rash Neuro:  Strength and sensation are intact Psych: euthymic mood, full affect   EKG:  EKG is ordered today. The ekg ordered today demonstrates sinus rhythm, rate 83 bpm, no STE/D, no TWI, no significant change from previous.    Recent Labs: 11/11/2019: Pro B Natriuretic peptide (BNP) 35.0 12/30/2019: Magnesium 2.0 01/23/2020: ALT 19; BUN 12; Creatinine, Ser 0.87; Hemoglobin 12.7; Platelets 332; Potassium 4.2; Sodium 140 01/28/2020: TSH 0.71    Lipid Panel    Component Value Date/Time   TRIG 169 (H) 12/26/2019 1501      Wt Readings from Last 3 Encounters:  06/29/20 194 lb 9.6 oz (88.3 kg)  06/22/20 194 lb (88 kg)  06/16/20 195 lb (88.5 kg)      Other studies Reviewed: Additional studies/ records that were reviewed today include:   Echocardiogram 02/2020: 1. Left ventricular  ejection fraction, by estimation, is 60 to 65%. The  left ventricle has normal function. The left ventricle has no regional  wall motion abnormalities. Left ventricular diastolic parameters were  normal.  2. Right ventricular systolic function is normal. The right ventricular  size is normal. There is normal pulmonary artery systolic pressure.  3. The mitral valve is normal in structure. Trivial mitral valve  regurgitation. No evidence of mitral stenosis.  4. The aortic valve is normal in structure. Aortic valve regurgitation is  not visualized. No aortic stenosis is present.  5. The inferior vena cava is normal in size with greater than 50%  respiratory variability, suggesting right atrial pressure of 3 mmHg.    ASSESSMENT AND PLAN:  1. HTN: BP 118/66 today - Continue amlodipine and bisoprolol  2. Tachycardia: HR 83 today. No significant palpitations or racing heart beat complaints.  - Continue bisoprolol  3. Murmur: she continues to have a minor murmur on exam though echocardiogram reveals only trivial MR.  - Continue to monitor for now  4. HLD: LDL 103 02/2020; goal <100 - Continue healthy dietary/lifestyle modifications   Current medicines are reviewed at length with the patient today.  The patient does not have concerns regarding medicines.  The following changes have been made:  As above  Labs/ tests ordered today include:   Orders Placed This Encounter  Procedures  . EKG 12-Lead     Disposition:   FU with Dr. Sallyanne Kuster in 1 year  Signed, Abigail Butts, PA-C  07/03/2020 11:19 PM

## 2020-06-29 ENCOUNTER — Other Ambulatory Visit: Payer: Self-pay

## 2020-06-29 ENCOUNTER — Ambulatory Visit: Payer: BC Managed Care – PPO | Admitting: Medical

## 2020-06-29 VITALS — BP 118/66 | HR 83 | Ht 61.0 in | Wt 194.6 lb

## 2020-06-29 DIAGNOSIS — R011 Cardiac murmur, unspecified: Secondary | ICD-10-CM | POA: Diagnosis not present

## 2020-06-29 DIAGNOSIS — I10 Essential (primary) hypertension: Secondary | ICD-10-CM

## 2020-06-29 DIAGNOSIS — E785 Hyperlipidemia, unspecified: Secondary | ICD-10-CM | POA: Diagnosis not present

## 2020-06-29 DIAGNOSIS — R000 Tachycardia, unspecified: Secondary | ICD-10-CM | POA: Diagnosis not present

## 2020-06-29 NOTE — Patient Instructions (Signed)
Medication Instructions:  NO CHANGES *If you need a refill on your cardiac medications before your next appointment, please call your pharmacy*  Lab Work: NONE ORDERED THIS VISIT  Testing/Procedures: NONE ORDERED THIS VISIT  Follow-Up: At [facility], you and your health needs are our priority.  As part of our continuing mission to provide you with exceptional heart care, we have created designated Provider Care Teams.  These Care Teams include your primary Cardiologist (physician) and Advanced Practice Providers (APPs -  Physician Assistants and Nurse Practitioners) who all work together to provide you with the care you need, when you need it.    Your next appointment:   12 month(s)  The format for your next appointment:   In Person  Provider:   Sanda Klein, MD  Other Instructions Fat and Cholesterol Restricted Eating Plan Getting too much fat and cholesterol in your diet may cause health problems. Choosing the right foods helps keep your fat and cholesterol at normal levels. This can keep you from getting certain diseases. What are tips for following this plan? Meal planning  At meals, divide your plate into four equal parts: ? Fill one-half of your plate with vegetables and green salads. ? Fill one-fourth of your plate with whole grains. ? Fill one-fourth of your plate with low-fat (lean) protein foods.  Eat fish that is high in omega-3 fats at least two times a week. This includes mackerel, tuna, sardines, and salmon.  Eat foods that are high in fiber, such as whole grains, beans, apples, broccoli, carrots, peas, and barley. General tips   Work with your doctor to lose weight if you need to.  Avoid: ? Foods with added sugar. ? Fried foods. ? Foods with partially hydrogenated oils.  Limit alcohol intake to no more than 1 drink a day for nonpregnant women and 2 drinks a day for men. One drink equals 12 oz of beer, 5 oz of wine, or 1 oz of hard liquor. Reading  food labels  Check food labels for: ? Trans fats. ? Partially hydrogenated oils. ? Saturated fat (g) in each serving. ? Cholesterol (mg) in each serving. ? Fiber (g) in each serving.  Choose foods with healthy fats, such as: ? Monounsaturated fats. ? Polyunsaturated fats. ? Omega-3 fats.  Choose grain products that have whole grains. Look for the word "whole" as the first word in the ingredient list. Cooking  Cook foods using low-fat methods. These include baking, boiling, grilling, and broiling.  Eat more home-cooked foods. Eat at restaurants and buffets less often.  Avoid cooking using saturated fats, such as butter, cream, palm oil, palm kernel oil, and coconut oil. Recommended foods  Fruits  All fresh, canned (in natural juice), or frozen fruits. Vegetables  Fresh or frozen vegetables (raw, steamed, roasted, or grilled). Green salads. Grains  Whole grains, such as whole wheat or whole grain breads, crackers, cereals, and pasta. Unsweetened oatmeal, bulgur, barley, quinoa, or brown rice. Corn or whole wheat flour tortillas. Meats and other protein foods  Ground beef (85% or leaner), grass-fed beef, or beef trimmed of fat. Skinless chicken or [place]. Ground chicken or [place]. Pork trimmed of fat. All fish and seafood. Egg whites. Dried beans, peas, or lentils. Unsalted nuts or seeds. Unsalted canned beans. Nut butters without added sugar or oil. Dairy  Low-fat or nonfat dairy products, such as skim or 1% milk, 2% or reduced-fat cheeses, low-fat and fat-free ricotta or cottage cheese, or plain low-fat and nonfat yogurt. Fats and oils  Tub margarine without trans fats. Light or reduced-fat mayonnaise and salad dressings. Avocado. Olive, canola, sesame, or safflower oils. The items listed above may not be a complete list of foods and beverages you can eat. Contact a dietitian for more information. Foods to avoid Fruits  Canned fruit in heavy syrup. Fruit in cream or  butter sauce. Fried fruit. Vegetables  Vegetables cooked in cheese, cream, or butter sauce. Fried vegetables. Grains  White bread. White pasta. White rice. Cornbread. Bagels, pastries, and croissants. Crackers and snack foods that contain trans fat and hydrogenated oils. Meats and other protein foods  Fatty cuts of meat. Ribs, chicken wings, bacon, sausage, bologna, salami, chitterlings, fatback, hot dogs, bratwurst, and packaged lunch meats. Liver and organ meats. Whole eggs and egg yolks. Chicken and [place] with skin. Fried meat. Dairy  Whole or 2% milk, cream, half-and-half, and cream cheese. Whole milk cheeses. Whole-fat or sweetened yogurt. Full-fat cheeses. Nondairy creamers and whipped toppings. Processed cheese, cheese spreads, and cheese curds. Beverages  Alcohol. Sugar-sweetened drinks such as sodas, lemonade, and fruit drinks. Fats and oils  Butter, stick margarine, lard, shortening, ghee, or bacon fat. Coconut, palm kernel, and palm oils. Sweets and desserts  Corn syrup, sugars, honey, and molasses. Candy. Jam and jelly. Syrup. Sweetened cereals. Cookies, pies, cakes, donuts, muffins, and ice cream. The items listed above may not be a complete list of foods and beverages you should avoid. Contact a dietitian for more information. Summary  Choosing the right foods helps keep your fat and cholesterol at normal levels. This can keep you from getting certain diseases.  At meals, fill one-half of your plate with vegetables and green salads.  Eat high-fiber foods, like whole grains, beans, apples, carrots, peas, and barley.  Limit added sugar, saturated fats, alcohol, and fried foods. This information is not intended to replace advice given to you by your health care provider. Make sure you discuss any questions you have with your health care provider. Document Revised: 04/24/2018 Document Reviewed: 05/08/2017 Elsevier Patient Education  [place].

## 2020-07-01 ENCOUNTER — Encounter: Payer: Self-pay | Admitting: Medical

## 2020-07-12 NOTE — Progress Notes (Signed)
TYKK 

## 2020-07-20 ENCOUNTER — Ambulatory Visit: Payer: BC Managed Care – PPO | Admitting: Physical Therapy

## 2020-08-12 ENCOUNTER — Ambulatory Visit: Payer: Medicaid Other | Admitting: Physical Therapy

## 2020-08-23 ENCOUNTER — Encounter: Payer: Self-pay | Admitting: Psychiatry

## 2020-08-23 ENCOUNTER — Other Ambulatory Visit: Payer: Self-pay

## 2020-08-23 ENCOUNTER — Ambulatory Visit (INDEPENDENT_AMBULATORY_CARE_PROVIDER_SITE_OTHER): Payer: BC Managed Care – PPO | Admitting: Psychiatry

## 2020-08-23 DIAGNOSIS — F411 Generalized anxiety disorder: Secondary | ICD-10-CM

## 2020-08-23 DIAGNOSIS — F4001 Agoraphobia with panic disorder: Secondary | ICD-10-CM

## 2020-08-23 DIAGNOSIS — F5105 Insomnia due to other mental disorder: Secondary | ICD-10-CM

## 2020-08-23 DIAGNOSIS — G2581 Restless legs syndrome: Secondary | ICD-10-CM

## 2020-08-23 MED ORDER — GABAPENTIN ENACARBIL ER 600 MG PO TBCR: EXTENDED_RELEASE_TABLET | ORAL | 1 refills | Status: DC

## 2020-08-23 NOTE — Progress Notes (Signed)
[name] [id number] 07/16/1971 49 y.o.  Subjective:   Patient ID:  [name] is a 49 y.o. (DOB September 30, 1970) female.  Chief Complaint:  Chief Complaint  Patient presents with  . Follow-up  . Anxiety  . Sleeping Problem    HPI [name] presents to the office today for follow-up of panic disorder, generalized anxiety, and insomnia.  First seen December 02, 2019. Referred by Dr. Deland Pretty.  She was on alprazolam XR 0.5 mg twice daily and citalopram 40 mg daily along with opiates at the time. Buspirone was initiated.  04/21/2020 appointment with the following noted: Covid and hosp for a week 12/26/19.  Got Remdesivir.  Finally recovering.  Still tired and aches. Steroids helped chronic pain from Lyme dz but relapsed off steroids. On opiates Belbuca still.  Still on Xanax XR 0.5 mg BID, citalopram 40 mg daily.  Off buspirone bc HA and shakey after a week. Still anxious about Covid.  Has to push herself to leave the house.  No full panic but panicky feelings. Still sees therapist [name]. Settled worker's comp case from 10 years ago. Celexa does best of SSRI she's taken but not controlled. Normal QT interval now. Trouble with sleep sometimes. Plan: She prefers rispieridone 0.5 mg HS for a week then 1 tablet nightly if needed.  06/23/2020 appointment with the following noted: Too foggy and slowed down on risperidone 0.5 mg after a week's trial. So stopped it. Went to Post-Covid clinic yesterday with good antibodies.   Shouldn't worry over covid but still does.  Life in limbo after settled worker's comp. Exercising, pray, writing and therapy with [facility]. Can awaken with panic every 2-3 days.  Distracts herself. Awoke at 2AM with panic with fear of not breathing.  Anxiety driving.   Chronic fatigue and sleepy before and worse after post Covid  Plan:  Retry gabapentin  800 mg tablet 1/2 tablet QID up to 1 tablet QID  08/23/2020 appointment with the following  noted: Increase gabapentin to 800 mg QID.  Works good for 2 hours and then seems to wear off.   NO SE.  Asks about time release.  Helped with panicky feelings and pain.   Hx RLS Anxiety is improved but not gone as noted.  When it runs out still awakens with panic but able to go back to sleep.  Past Psychiatric History: therapist [facility] Dr. Porfirio Mylar. Dr. Koleen Distance [facility] Past Psychiatric Medication Trials: Zoloft SE HA and itching, Paxil wt gain 30# and fatigue, Fluoxetine NR, Lexapro  not as good as Celexa Cymbalta SE dryness dental problems, no pain benefit Gabapentin NR pain midrange Gabatril for sleep 16 mg  Alprazolam 1 mg BID,   Buspirone SE HA  Risperidone SE  Review of Systems:  Review of Systems  Constitutional: Positive for fatigue.  Respiratory: Positive for shortness of breath.   Musculoskeletal: Positive for myalgias.  Neurological: Positive for weakness. Negative for tremors.       Restless legs    Medications: I have reviewed the patient's current medications.  Current Outpatient Medications  Medication Sig Dispense Refill  . albuterol (PROAIR HFA) 108 (90 Base) MCG/ACT inhaler Inhale 2 puffs into the lungs every 6 (six) hours as needed for wheezing or shortness of breath. 18 g 3  . ALPRAZolam (XANAX XR) 0.5 MG 24 hr tablet Take 0.5 mg by mouth in the morning and at bedtime.    Marland Kitchen amLODipine (NORVASC) 2.5 MG tablet Take 1  tablet (2.5 mg total) by mouth daily. NEED OV. 90 tablet 3  . baclofen (LIORESAL) 20 MG tablet Take 20 mg by mouth as needed.    . beclomethasone (QVAR REDIHALER) 40 MCG/ACT inhaler Inhale 2 puffs into the lungs 2 (two) times daily. 10.6 g 5  . bisoprolol (ZEBETA) 10 MG tablet Take 10 mg by mouth daily.     . Buprenorphine HCl (BELBUCA) 450 MCG FILM Place 1 Film inside cheek in the morning and at bedtime.    . cetirizine (ZYRTEC) 10 MG tablet Take 10 mg by mouth daily.     . Cholecalciferol (VITAMIN D PO) Take 1 tablet by mouth daily.      . citalopram (CELEXA) 40 MG tablet Take 40 mg by mouth daily.    Marland Kitchen gabapentin (NEURONTIN) 800 MG tablet 1/2 tablet 4 times for 1 week then 1 tablet 3 times daily for 1 week then 1 tablet 4 times daily. 120 tablet 1  . hydrOXYzine (ATARAX/VISTARIL) 25 MG tablet Take 25 mg by mouth as needed for anxiety.     . Hyoscyamine Sulfate SL 0.125 MG SUBL Take 0.125 mg by mouth every 4 (four) hours as needed for cramping.    Marland Kitchen ibuprofen (ADVIL) 200 MG tablet Take by mouth.    . Melatonin 10 MG TABS Take 10 mg by mouth at bedtime.     . Multiple Vitamin (MULTI-VITAMIN) tablet Take by mouth.    . norethindrone-ethinyl estradiol (LOESTRIN FE) 1-20 MG-MCG tablet Take 1 tablet by mouth daily.    Marland Kitchen POLY-IRON 150 150 MG capsule Take 300 mg by mouth daily.    . Probiotic Product (ALIGN PO) Take 1 capsule by mouth daily.     . prochlorperazine (COMPAZINE) 10 MG tablet Take 10 mg by mouth every 6 (six) hours as needed for nausea or vomiting.     . promethazine (PHENERGAN) 25 MG tablet Take 25 mg by mouth every 6 (six) hours as needed for nausea or vomiting.     . ranitidine (ZANTAC) 150 MG tablet Take by mouth.    Marland Kitchen rOPINIRole (REQUIP) 2 MG tablet Take 2 mg by mouth at bedtime.     Marland Kitchen Spacer/Aero-Holding Chambers (AEROCHAMBER MV) inhaler Use as instructed 1 each 0  . thyroid (ARMOUR) 90 MG tablet Take 120 mg by mouth daily. NP Thyroid    . valACYclovir (VALTREX) 1000 MG tablet Take by mouth.    . vitamin B-12 (CYANOCOBALAMIN) 500 MCG tablet Take 500 mcg by mouth daily.    . Gabapentin Enacarbil 600 MG TBCR 2 in the morning and 3 in the evening 150 tablet 1   No current facility-administered medications for this visit.    Medication Side Effects: None  Allergies:  Allergies  Allergen Reactions  . Allegra [Fexofenadine Hcl] Shortness Of Breath and Rash  . Fexofenadine Shortness Of Breath, Rash and Other (See Comments)    Shakes, can't sleep, trouble breathing  . Oseltamivir Other (See Comments)    Can't  breathe, sick to stomach  . Tamiflu [Oseltamivir Phosphate] Itching, Nausea And Vomiting and Other (See Comments)    Can't breathe, sick to stomach  . Vancomycin Other (See Comments)    Red man's Syndrome.  . Diphenhydramine Other (See Comments)    Shakes, can't sleep, trouble breathing  . Latex Itching, Dermatitis and Rash  . Sulfamethoxazole Rash  . Aspirin Nausea And Vomiting  . Doxycycline   . Other     NUT ALLERGY  . Diphenhydramine Hcl Palpitations and Rash  .  Morphine Other (See Comments)    REACTION: insomnia  . Oseltamivir Phosphate Itching and Nausea And Vomiting  . Penicillins Itching and Swelling    Can tolerate Amoxicillin OK.  . Sulfonamide Derivatives Rash    Past Medical History:  Diagnosis Date  . Anxiety   . Arrhythmia    heart races when pt in pain  . Asthma   . Clostridium difficile infection 2014  . [place] Patient virus infection   . Fibromyalgia   . GERD (gastroesophageal reflux disease)   . History of COVID-19   . Hypertension   . Hypothyroidism   . Immune deficiency disorder ([place])   . Lyme disease   . Mold contact confirmed    Toxic Mold syndrome  . Mononucleosis 5-10  . Ovarian cyst    Serous Cystadenofibroma-Left ovary  . PONV (postoperative nausea and vomiting)   . Shortness of breath    on exertion  . Systolic murmur   . Thyroid disease     Family History  Problem Relation Age of Onset  . Hypertension Mother   . Diabetes Mother   . Pulmonary embolism Mother        x2  . Heart disease Mother   . Breast cancer Maternal Aunt        Age 87  . Pancreatic cancer Maternal Grandmother   . Heart disease Maternal Grandfather   . Breast cancer Paternal Grandmother        Age 75  . Asthma Paternal Grandmother   . Hypertension Father   . Thyroid disease Father     Social History   Socioeconomic History  . Marital status: Single    Spouse name: Not on file  . Number of children: 0  . Years of education: college  . Highest education  level: Not on file  Occupational History  . Occupation: Pharmacist, hospital  Tobacco Use  . Smoking status: Never Smoker  . Smokeless tobacco: Never Used  Vaping Use  . Vaping Use: Never used  Substance and Sexual Activity  . Alcohol use: No    Alcohol/week: 0.0 standard drinks  . Drug use: No  . Sexual activity: Never    Birth control/protection: Pill  Other Topics Concern  . Not on file  Social History Narrative   Lives at home alone.   Right-handed.   Caffeine use: 1 cup per day.   Social Determinants of Health   Financial Resource Strain: Not on file  Food Insecurity: Not on file  Transportation Needs: Not on file  Physical Activity: Not on file  Stress: Not on file  Social Connections: Not on file  Intimate Partner Violence: Not on file    Past Medical History, Surgical history, Social history, and Family history were reviewed and updated as appropriate.   Please see review of systems for further details on the patient's review from today.   Objective:   Physical Exam:  There were no vitals taken for this visit.  Physical Exam Constitutional:      General: She is not in acute distress. Musculoskeletal:        General: No deformity.  Neurological:     Mental Status: She is alert and oriented to person, place, and time.     Coordination: Coordination normal.  Psychiatric:        Attention and Perception: Attention and perception normal. She does not perceive auditory or visual hallucinations.        Mood and Affect: Mood is anxious. Mood is not depressed.  Affect is not labile, blunt, angry, tearful or inappropriate.        Speech: Speech normal.        Behavior: Behavior normal.        Thought Content: Thought content normal. Thought content is not paranoid or delusional. Thought content does not include homicidal or suicidal ideation. Thought content does not include homicidal or suicidal plan.        Cognition and Memory: Cognition and memory normal.        Judgment:  Judgment normal.     Comments: Insight fair     Lab Review:     Component Value Date/Time   NA 140 01/23/2020 1202   NA 142 09/09/2013 1102   K 4.2 01/23/2020 1202   K 4.3 09/09/2013 1102   CL 99 01/23/2020 1202   CO2 20 01/23/2020 1202   CO2 27 09/09/2013 1102   GLUCOSE 105 (H) 01/23/2020 1202   GLUCOSE 135 (H) 12/30/2019 0658   GLUCOSE 95 09/09/2013 1102   BUN 12 01/23/2020 1202   BUN 11.0 09/09/2013 1102   CREATININE 0.87 01/23/2020 1202   CREATININE 0.9 09/09/2013 1102   CALCIUM 9.3 01/23/2020 1202   CALCIUM 9.9 09/09/2013 1102   PROT 7.1 01/23/2020 1202   PROT 7.9 09/09/2013 1102   ALBUMIN 4.2 01/23/2020 1202   ALBUMIN 3.5 09/09/2013 1102   AST 24 01/23/2020 1202   AST 19 09/09/2013 1102   ALT 19 01/23/2020 1202   ALT 18 09/09/2013 1102   ALKPHOS 88 01/23/2020 1202   ALKPHOS 80 09/09/2013 1102   BILITOT 0.2 01/23/2020 1202   BILITOT 0.53 09/09/2013 1102   GFRNONAA 80 01/23/2020 1202   GFRAA 92 01/23/2020 1202       Component Value Date/Time   WBC 7.7 01/23/2020 1202   WBC 4.9 12/30/2019 0658   RBC 4.47 01/23/2020 1202   RBC 4.19 12/30/2019 0658   HGB 12.7 01/23/2020 1202   HGB 13.9 09/09/2013 1102   HCT 39.5 01/23/2020 1202   HCT 40.9 09/09/2013 1102   PLT 332 01/23/2020 1202   MCV 88 01/23/2020 1202   MCV 89.1 09/09/2013 1102   MCH 28.4 01/23/2020 1202   MCH 28.2 12/30/2019 0658   MCHC 32.2 01/23/2020 1202   MCHC 31.9 12/30/2019 0658   RDW 13.5 01/23/2020 1202   RDW 13.6 09/09/2013 1102   LYMPHSABS 3.1 01/23/2020 1202   LYMPHSABS 3.2 09/09/2013 1102   MONOABS 0.3 12/30/2019 0658   MONOABS 0.5 09/09/2013 1102   EOSABS 0.4 01/23/2020 1202   BASOSABS 0.1 01/23/2020 1202   BASOSABS 0.1 09/09/2013 1102    No results found for: POCLITH, LITHIUM   No results found for: PHENYTOIN, PHENOBARB, VALPROATE, CBMZ   .res Assessment: Plan:    Jeanean was seen today for follow-up, anxiety and sleeping problem.  Diagnoses and all orders for this  visit:  Generalized anxiety disorder -     Gabapentin Enacarbil 600 MG TBCR; 2 in the morning and 3 in the evening  Panic disorder with agoraphobia -     Gabapentin Enacarbil 600 MG TBCR; 2 in the morning and 3 in the evening  Insomnia due to mental condition  Restless legs syndrome -     Gabapentin Enacarbil 600 MG TBCR; 2 in the morning and 3 in the evening  Anxiety is no better than last time with partial response to current meds:  . Intolerant of buspirone, and risperidone.  Patient is also on opiates.  Disc risk with Xanax.  We discussed the short-term risks associated with benzodiazepines including sedation and increased fall risk among others.  Discussed long-term side effect risk including dependence, potential withdrawal symptoms, and the potential eventual dose-related risk of dementia.  But recent studies from 2020 dispute this association between benzodiazepines and dementia risk. Newer studies in 2020 do not support an association with dementia. Continue alprazolam XR 0.5 mg twice daily and citalopram 40 mg daily for now. No plans to increase alprazolam bc pt on opiate.   Options risperidone off label, switch citalopram to Viibryd, Luvox, off label Trileptal, venlafaxine, Abilify, clonidine, retry higher dose gabapentin, higher than 40 citalopram but would have to get EKG. EKG 01/14/20 QTc 512 so cannot increase citalopram. Also hesitant to use TCA for the same reason.  Gabapentin is helping but is wearing off  After a couple of hours.  will therefore try to get Horizant approved at 3000 mg a day.  That is 600 mg tablets 2 in the morning and 3 in the evening  Discussed potential metabolic side effects associated with atypical antipsychotics, as well as potential risk for movement side effects. Advised pt to contact office if movement side effects occur.   FU 6 weeks  Lynder Parents, MD, DFAPA   Please see After Visit Summary for patient specific instructions.  Future  Appointments  Date Time Provider [place]  09/21/2020  2:15 PM Hosie Spangle [facility]  06/20/2021  4:00 PM Princess Bruins, MD [id number]    No orders of the defined types were placed in this encounter.   -------------------------------

## 2020-09-21 ENCOUNTER — Ambulatory Visit: Payer: Medicaid Other | Admitting: Physical Therapy

## 2020-10-12 ENCOUNTER — Ambulatory Visit: Payer: Medicaid Other

## 2020-10-20 ENCOUNTER — Ambulatory Visit: Payer: Self-pay | Admitting: Psychiatry

## 2020-10-23 ENCOUNTER — Other Ambulatory Visit: Payer: Self-pay | Admitting: Psychiatry

## 2020-10-23 DIAGNOSIS — F4001 Agoraphobia with panic disorder: Secondary | ICD-10-CM

## 2020-10-23 DIAGNOSIS — F411 Generalized anxiety disorder: Secondary | ICD-10-CM

## 2020-10-23 DIAGNOSIS — G2581 Restless legs syndrome: Secondary | ICD-10-CM

## 2020-11-02 ENCOUNTER — Ambulatory Visit: Payer: Medicaid Other | Admitting: Physical Therapy

## 2020-11-10 NOTE — Telephone Encounter (Signed)
Does this need to be refilled?

## 2020-11-17 ENCOUNTER — Other Ambulatory Visit: Payer: Self-pay | Admitting: Psychiatry

## 2020-11-17 DIAGNOSIS — F411 Generalized anxiety disorder: Secondary | ICD-10-CM

## 2020-11-17 DIAGNOSIS — F4001 Agoraphobia with panic disorder: Secondary | ICD-10-CM

## 2020-11-24 ENCOUNTER — Ambulatory Visit: Payer: Self-pay | Admitting: Psychiatry

## 2021-01-12 ENCOUNTER — Ambulatory Visit: Payer: Self-pay | Admitting: Psychiatry

## 2021-01-15 ENCOUNTER — Other Ambulatory Visit: Payer: Self-pay | Admitting: Psychiatry

## 2021-01-15 DIAGNOSIS — F4001 Agoraphobia with panic disorder: Secondary | ICD-10-CM

## 2021-01-15 DIAGNOSIS — F411 Generalized anxiety disorder: Secondary | ICD-10-CM

## 2021-03-03 ENCOUNTER — Ambulatory Visit: Payer: Self-pay | Admitting: Psychiatry

## 2021-04-14 ENCOUNTER — Encounter: Payer: Self-pay | Admitting: Psychiatry

## 2021-04-14 ENCOUNTER — Ambulatory Visit (INDEPENDENT_AMBULATORY_CARE_PROVIDER_SITE_OTHER): Payer: BC Managed Care – PPO | Admitting: Psychiatry

## 2021-04-14 ENCOUNTER — Other Ambulatory Visit: Payer: Self-pay | Admitting: Psychiatry

## 2021-04-14 ENCOUNTER — Other Ambulatory Visit: Payer: Self-pay

## 2021-04-14 DIAGNOSIS — F4001 Agoraphobia with panic disorder: Secondary | ICD-10-CM

## 2021-04-14 DIAGNOSIS — G2581 Restless legs syndrome: Secondary | ICD-10-CM

## 2021-04-14 DIAGNOSIS — F411 Generalized anxiety disorder: Secondary | ICD-10-CM

## 2021-04-14 DIAGNOSIS — F5105 Insomnia due to other mental disorder: Secondary | ICD-10-CM | POA: Diagnosis not present

## 2021-04-14 MED ORDER — GABAPENTIN ENACARBIL ER 600 MG PO TBCR: 1200.0000 mg | EXTENDED_RELEASE_TABLET | Freq: Two times a day (BID) | ORAL | 1 refills | Status: DC

## 2021-04-14 NOTE — Telephone Encounter (Signed)
Please review pharmacy note

## 2021-04-14 NOTE — Progress Notes (Signed)
[name] [id number] 11-17-1970 50 y.o.  Subjective:   Patient ID:  [name] is a 50 y.o. (DOB [birth year]) female.  Chief Complaint:  Chief Complaint  Patient presents with   Follow-up   Anxiety   Sleeping Problem   Medication Problem    Wants Gabapentin ER and needs PA foer it.    HPI [name] presents to the office today for follow-up of panic disorder, generalized anxiety, and insomnia.  First seen December 02, 2019. Referred by Dr. Deland Pretty.  She was on alprazolam XR 0.5 mg twice daily and citalopram 40 mg daily along with opiates at the time. Buspirone was initiated.  04/21/2020 appointment with the following noted: Covid and hosp for a week 12/26/19.  Got Remdesivir.  Finally recovering.  Still tired and aches. Steroids helped chronic pain from Lyme dz but relapsed off steroids. On opiates Bel[place] still.  Still on Xanax XR 0.5 mg BID, citalopram 40 mg daily.  Off buspirone bc HA and shakey after a week. Still anxious about Covid.  Has to push herself to leave the house.  No full panic but panicky feelings. Still sees therapist Windle Guard. Settled worker's comp case from 10 years ago. Celexa does best of SSRI she's taken but not controlled. Normal QT interval now. Trouble with sleep sometimes. Plan: She prefers rispieridone 0.5 mg HS for a week then 1 tablet nightly if needed.  06/23/2020 appointment with the following noted: Too foggy and slowed down on risperidone 0.5 mg after a week's trial. So stopped it. Went to Post-Covid clinic yesterday with good antibodies.   Shouldn't worry over covid but still does.  Life in limbo after settled worker's comp. Exercising, pray, writing and therapy with [facility]. Can awaken with panic every 2-3 days.  Distracts herself. Awoke at 2AM with panic with fear of not breathing.  Anxiety driving.   Chronic fatigue and sleepy before and worse after post Covid  Plan:  Retry gabapentin  800 mg tablet 1/2 tablet QID up to 1  tablet QID  08/23/2020 appointment with the following noted: Increase gabapentin to 800 mg QID.  Works good for 2 hours and then seems to wear off.   NO SE.  Asks about time release.  Helped with panicky feelings and pain.   Hx RLS Anxiety is improved but not gone as noted.  When it runs out still awakens with panic but able to go back to sleep.  04/14/21 appt noted: Hydroxyzine for IC. Wants gabapentin ER and needs PA to get it.  Has taken regular gabapentin in it's place.  Regular gabapentin will not keep her asleep and awakens about 5 AM feeling panicky after 5-6 hours of sleep.  Gabapentin helps anxiety, pain and RLS but inadequate duration.  Has never taken gabapentin ER but wants to take it for greater duration and to stop withdrawal sx from the short acting gabapentin. Gabapentin helps ropinirole help RLS but it wears off as noted.  Gabapentin helps pain with better sleep until it wears off. "Tremendous" dental work repeatedly with dental problems causes anxiety.  [place] was causing dental problems.  3 more surgeries and then will be done.  Tooth pain and gum problems. Not sig depressed.  Past Psychiatric History: therapist [facility] Remote Dr. Porfirio Mylar. Dr. Koleen Distance [facility] Past Psychiatric Medication Trials: Zoloft SE HA and itching, Paxil wt gain 30# and fatigue, Fluoxetine NR, Lexapro  not as good as Celexa Cymbalta SE dryness dental problems, no pain  benefit Gabapentin NR pain midrange Gabatril for sleep 16 mg  Alprazolam 1 mg BID,   Buspirone SE HA  Risperidone SE  Review of Systems:  Review of Systems  Constitutional:  Positive for fatigue.  HENT:  Positive for dental problem.   Respiratory:  Positive for shortness of breath.   Musculoskeletal:  Positive for myalgias.  Neurological:  Positive for weakness. Negative for tremors.       Restless legs  Psychiatric/Behavioral:  The patient is nervous/anxious.    Medications: I have reviewed the patient's current  medications.  Current Outpatient Medications  Medication Sig Dispense Refill   albuterol (PROAIR HFA) 108 (90 Base) MCG/ACT inhaler Inhale 2 puffs into the lungs every 6 (six) hours as needed for wheezing or shortness of breath. 18 g 3   ALPRAZolam (XANAX XR) 0.5 MG 24 hr tablet Take 0.5 mg by mouth in the morning and at bedtime.     amLODipine (NORVASC) 2.5 MG tablet Take 1 tablet (2.5 mg total) by mouth daily. NEED OV. 90 tablet 3   baclofen (LIORESAL) 20 MG tablet Take 20 mg by mouth as needed.     beclomethasone (QVAR REDIHALER) 40 MCG/ACT inhaler Inhale 2 puffs into the lungs 2 (two) times daily. 10.6 g 5   bisoprolol (ZEBETA) 10 MG tablet Take 10 mg by mouth daily.      Buprenorphine HCl (BELBUCA) 450 MCG FILM Place 1 Film inside cheek in the morning and at bedtime.     cetirizine (ZYRTEC) 10 MG tablet Take 10 mg by mouth daily.      Cholecalciferol (VITAMIN D PO) Take 1 tablet by mouth daily.      citalopram (CELEXA) 40 MG tablet Take 40 mg by mouth daily.     gabapentin (NEURONTIN) 800 MG tablet Take 1 tablet (800 mg total) by mouth in the morning, at noon, in the evening, and at bedtime. TAKE 1/2 TAB 4 TIMES A DAY FOR 1 WEEK THEN 1 TAB 3 TIMES A DAY FOR 1 WEEK THEN 1 TAB 4 TIMES A DAY 120 tablet 1   hydrOXYzine (ATARAX/VISTARIL) 25 MG tablet Take 25 mg by mouth as needed for anxiety.      Hyoscyamine Sulfate SL 0.125 MG SUBL Take 0.125 mg by mouth every 4 (four) hours as needed for cramping.     ibuprofen (ADVIL) 200 MG tablet Take by mouth.     Melatonin 10 MG TABS Take 10 mg by mouth at bedtime.      Multiple Vitamin (MULTI-VITAMIN) tablet Take by mouth.     norethindrone-ethinyl estradiol (LOESTRIN FE) 1-20 MG-MCG tablet Take 1 tablet by mouth daily.     POLY-IRON 150 150 MG capsule Take 300 mg by mouth daily.     Probiotic Product (ALIGN PO) Take 1 capsule by mouth daily.      prochlorperazine (COMPAZINE) 10 MG tablet Take 10 mg by mouth every 6 (six) hours as needed for nausea or  vomiting.      promethazine (PHENERGAN) 25 MG tablet Take 25 mg by mouth every 6 (six) hours as needed for nausea or vomiting.      ranitidine (ZANTAC) 150 MG tablet Take by mouth.     rOPINIRole (REQUIP) 2 MG tablet Take 2 mg by mouth at bedtime.      Spacer/Aero-Holding Chambers (AEROCHAMBER MV) inhaler Use as instructed 1 each 0   thyroid (ARMOUR) 90 MG tablet Take 120 mg by mouth daily. NP Thyroid     valACYclovir (VALTREX) 1000 MG  tablet Take by mouth.     vitamin B-12 (CYANOCOBALAMIN) 500 MCG tablet Take 500 mcg by mouth daily.     Gabapentin Enacarbil 600 MG TBCR Take 2 tablets (1,200 mg total) by mouth 2 (two) times daily. 2 in the morning and 3 in the evening 120 tablet 1   No current facility-administered medications for this visit.    Medication Side Effects: None  Allergies:  Allergies  Allergen Reactions   Allegra [Fexofenadine Hcl] Shortness Of Breath and Rash   Fexofenadine Shortness Of Breath, Rash and Other (See Comments)    Shakes, can't sleep, trouble breathing   Oseltamivir Other (See Comments)    Can't breathe, sick to stomach   Tamiflu [Oseltamivir Phosphate] Itching, Nausea And Vomiting and Other (See Comments)    Can't breathe, sick to stomach   Vancomycin Other (See Comments)    Red man's Syndrome.   Diphenhydramine Other (See Comments)    Shakes, can't sleep, trouble breathing   Latex Itching, Dermatitis and Rash   Sulfamethoxazole Rash   Aspirin Nausea And Vomiting   Doxycycline    Other     NUT ALLERGY   Diphenhydramine Hcl Palpitations and Rash   Morphine Other (See Comments)    REACTION: insomnia   Oseltamivir Phosphate Itching and Nausea And Vomiting   Penicillins Itching and Swelling    Can tolerate Amoxicillin OK.   Sulfonamide Derivatives Rash    Past Medical History:  Diagnosis Date   Anxiety    Arrhythmia    heart races when pt in pain   Asthma    Clostridium difficile infection 2014   Epstein Barr virus infection    Fibromyalgia     GERD (gastroesophageal reflux disease)    History of COVID-19    Hypertension    Hypothyroidism    Immune deficiency disorder ([place])    Lyme disease    Mold contact confirmed    Toxic Mold syndrome   Mononucleosis 5-10   Ovarian cyst    Serous Cystadenofibroma-Left ovary   PONV (postoperative nausea and vomiting)    Shortness of breath    on exertion   Systolic murmur    Thyroid disease     Family History  Problem Relation Age of Onset   Hypertension Mother    Diabetes Mother    Pulmonary embolism Mother        x2   Heart disease Mother    Breast cancer Maternal Aunt        Age 42   Pancreatic cancer Maternal Grandmother    Heart disease Maternal Grandfather    Breast cancer Paternal Grandmother        Age 47   Asthma Paternal Grandmother    Hypertension Father    Thyroid disease Father     Social History   Socioeconomic History   Marital status: Single    Spouse name: Not on file   Number of children: 0   Years of education: college   Highest education level: Not on file  Occupational History   Occupation: Pharmacist, hospital  Tobacco Use   Smoking status: Never   Smokeless tobacco: Never  Vaping Use   Vaping Use: Never used  Substance and Sexual Activity   Alcohol use: No    Alcohol/week: 0.0 standard drinks   Drug use: No   Sexual activity: Never    Birth control/protection: Pill  Other Topics Concern   Not on file  Social History Narrative   Lives at home alone.  Right-handed.   Caffeine use: 1 cup per day.   Social Determinants of Health   Financial Resource Strain: Not on file  Food Insecurity: Not on file  Transportation Needs: Not on file  Physical Activity: Not on file  Stress: Not on file  Social Connections: Not on file  Intimate Partner Violence: Not on file    Past Medical History, Surgical history, Social history, and Family history were reviewed and updated as appropriate.   Please see review of systems for further details on the  patient's review from today.   Objective:   Physical Exam:  There were no vitals taken for this visit.  Physical Exam Constitutional:      General: She is not in acute distress. Musculoskeletal:        General: No deformity.  Neurological:     Mental Status: She is alert and oriented to person, place, and time.     Coordination: Coordination normal.  Psychiatric:        Attention and Perception: Attention and perception normal. She does not perceive auditory or visual hallucinations.        Mood and Affect: Mood is anxious. Mood is not depressed. Affect is not labile, blunt, angry, tearful or inappropriate.        Speech: Speech normal. Speech is not slurred.        Behavior: Behavior normal. Behavior is not slowed.        Thought Content: Thought content normal. Thought content is not paranoid or delusional. Thought content does not include homicidal or suicidal ideation. Thought content does not include homicidal or suicidal plan.        Cognition and Memory: Cognition and memory normal.        Judgment: Judgment normal.     Comments: Insight fair    Lab Review:     Component Value Date/Time   NA 140 01/23/2020 1202   NA 142 09/09/2013 1102   K 4.2 01/23/2020 1202   K 4.3 09/09/2013 1102   CL 99 01/23/2020 1202   CO2 20 01/23/2020 1202   CO2 27 09/09/2013 1102   GLUCOSE 105 (H) 01/23/2020 1202   GLUCOSE 135 (H) 12/30/2019 0658   GLUCOSE 95 09/09/2013 1102   BUN 12 01/23/2020 1202   BUN 11.0 09/09/2013 1102   CREATININE 0.87 01/23/2020 1202   CREATININE 0.9 09/09/2013 1102   CALCIUM 9.3 01/23/2020 1202   CALCIUM 9.9 09/09/2013 1102   PROT 7.1 01/23/2020 1202   PROT 7.9 09/09/2013 1102   ALBUMIN 4.2 01/23/2020 1202   ALBUMIN 3.5 09/09/2013 1102   AST 24 01/23/2020 1202   AST 19 09/09/2013 1102   ALT 19 01/23/2020 1202   ALT 18 09/09/2013 1102   ALKPHOS 88 01/23/2020 1202   ALKPHOS 80 09/09/2013 1102   BILITOT 0.2 01/23/2020 1202   BILITOT 0.53 09/09/2013 1102    GFRNONAA 80 01/23/2020 1202   GFRAA 92 01/23/2020 1202       Component Value Date/Time   WBC 7.7 01/23/2020 1202   WBC 4.9 12/30/2019 0658   RBC 4.47 01/23/2020 1202   RBC 4.19 12/30/2019 0658   HGB 12.7 01/23/2020 1202   HGB 13.9 09/09/2013 1102   HCT 39.5 01/23/2020 1202   HCT 40.9 09/09/2013 1102   PLT 332 01/23/2020 1202   MCV 88 01/23/2020 1202   MCV 89.1 09/09/2013 1102   MCH 28.4 01/23/2020 1202   MCH 28.2 12/30/2019 0658   MCHC 32.2 01/23/2020 1202   MCHC  31.9 12/30/2019 0658   RDW 13.5 01/23/2020 1202   RDW 13.6 09/09/2013 1102   LYMPHSABS 3.1 01/23/2020 1202   LYMPHSABS 3.2 09/09/2013 1102   MONOABS 0.3 12/30/2019 0658   MONOABS 0.5 09/09/2013 1102   EOSABS 0.4 01/23/2020 1202   BASOSABS 0.1 01/23/2020 1202   BASOSABS 0.1 09/09/2013 1102    No results found for: POCLITH, LITHIUM   No results found for: PHENYTOIN, PHENOBARB, VALPROATE, CBMZ   .res Assessment: Plan:    Yihan was seen today for follow-up, anxiety, sleeping problem and medication problem.  Diagnoses and all orders for this visit:  Generalized anxiety disorder -     Gabapentin Enacarbil 600 MG TBCR; Take 2 tablets (1,200 mg total) by mouth 2 (two) times daily. 2 in the morning and 3 in the evening  Panic disorder with agoraphobia -     Gabapentin Enacarbil 600 MG TBCR; Take 2 tablets (1,200 mg total) by mouth 2 (two) times daily. 2 in the morning and 3 in the evening  Insomnia due to mental condition  Restless legs syndrome -     Gabapentin Enacarbil 600 MG TBCR; Take 2 tablets (1,200 mg total) by mouth 2 (two) times daily. 2 in the morning and 3 in the evening Anxiety is no better than last time with partial response to current meds:  . Intolerant of buspirone, and risperidone.  Patient is also on opiates.  Disc risk with Xanax.  We discussed the short-term risks associated with benzodiazepines including sedation and increased fall risk among others.  Discussed long-term side effect  risk including dependence, potential withdrawal symptoms, and the potential eventual dose-related risk of dementia.  But recent studies from 2020 dispute this association between benzodiazepines and dementia risk. Newer studies in 2020 do not support an association with dementia. Continue alprazolam XR 0.5 mg twice daily and citalopram 40 mg daily for now. No plans to increase alprazolam bc pt on opiate.   Options risperidone off label, switch citalopram to Viibryd, Luvox, off label Trileptal, venlafaxine, Abilify, clonidine, retry higher dose gabapentin, higher than 40 citalopram but would have to get EKG. EKG 01/14/20 QTc 512 so cannot increase citalopram. Also hesitant to use TCA for the same reason.  Gabapentin is helping but is wearing off  After a couple of hours.  will therefore try to get Horizant approved at 1200 mg BID for RLS but off label help for pain and anxiety. Wants gabapentin ER and needs PA to get it.  Has taken regular gabapentin in it's place.  Regular gabapentin will not keep her asleep and awakens about 5 AM feeling panicky after 5-6 hours of sleep.  Gabapentin helps anxiety, pain and RLS but inadequate duration.  Has never taken gabapentin ER but wants to take it for greater duration and to stop withdrawal sx from the short acting gabapentin.  Discussed potential metabolic side effects associated with atypical antipsychotics, as well as potential risk for movement side effects. Advised pt to contact office if movement side effects occur.   FU 2 mos  Lynder Parents, MD, DFAPA   Please see After Visit Summary for patient specific instructions.  Future Appointments  Date Time Provider [place]  05/23/2021  2:30 PM Cottle, Billey Co., MD [facility] None  06/30/2021  3:30 PM Princess Bruins, MD [facility] None    No orders of the defined types were placed in this encounter.   -------------------------------

## 2021-05-02 ENCOUNTER — Telehealth: Payer: Self-pay

## 2021-05-02 NOTE — Telephone Encounter (Signed)
Prior authorization submitted and DENIED for HORIZANT 600 MG 4 TABS DAILY. See below for responses from [facility] ID# [id number]   Medication is not on formulary. Only one listed is Neupro for an alternative medication.  Also the requested amount daily is more than the highest dose approved by FDA and adequate medical information was not submitted to support a higher dose for this condition.      Dr. Clovis Pu to be notified on how to proceed.

## 2021-05-23 ENCOUNTER — Ambulatory Visit: Payer: Self-pay | Admitting: Psychiatry

## 2021-06-20 ENCOUNTER — Ambulatory Visit: Payer: BC Managed Care – PPO | Admitting: Obstetrics & Gynecology

## 2021-06-23 ENCOUNTER — Encounter: Payer: Self-pay | Admitting: Psychiatry

## 2021-06-23 ENCOUNTER — Ambulatory Visit (INDEPENDENT_AMBULATORY_CARE_PROVIDER_SITE_OTHER): Payer: BC Managed Care – PPO | Admitting: Psychiatry

## 2021-06-23 ENCOUNTER — Other Ambulatory Visit: Payer: Self-pay

## 2021-06-23 DIAGNOSIS — F5105 Insomnia due to other mental disorder: Secondary | ICD-10-CM | POA: Diagnosis not present

## 2021-06-23 DIAGNOSIS — G2581 Restless legs syndrome: Secondary | ICD-10-CM | POA: Diagnosis not present

## 2021-06-23 DIAGNOSIS — F411 Generalized anxiety disorder: Secondary | ICD-10-CM

## 2021-06-23 DIAGNOSIS — F4001 Agoraphobia with panic disorder: Secondary | ICD-10-CM

## 2021-06-23 NOTE — Progress Notes (Signed)
[name] [id number] Dec 29, 1970 50 y.o.  Subjective:   Patient ID:  [name] is a 50 y.o. (DOB 1971/03/28) female.  Chief Complaint:  Chief Complaint  Patient presents with   Follow-up   Anxiety   Sleeping Problem    HPI [name] presents to the office today for follow-up of panic disorder, generalized anxiety, and insomnia.  First seen December 02, 2019. Referred by Dr. Deland Pretty.  She was on alprazolam XR 0.5 mg twice daily and citalopram 40 mg daily along with opiates at the time. Buspirone was initiated.  04/21/2020 appointment with the following noted: Covid and hosp for a week 12/26/19.  Got Remdesivir.  Finally recovering.  Still tired and aches. Steroids helped chronic pain from Lyme dz but relapsed off steroids. On opiates Bel[place] still.  Still on Xanax XR 0.5 mg BID, citalopram 40 mg daily.  Off buspirone bc HA and shakey after a week. Still anxious about Covid.  Has to push herself to leave the house.  No full panic but panicky feelings. Still sees therapist Windle Guard. Settled worker's comp case from 10 years ago. Celexa does best of SSRI she's taken but not controlled. Normal QT interval now. Trouble with sleep sometimes. Plan: She prefers rispieridone 0.5 mg HS for a week then 1 tablet nightly if needed.  06/23/2020 appointment with the following noted: Too foggy and slowed down on risperidone 0.5 mg after a week's trial. So stopped it. Went to Post-Covid clinic yesterday with good antibodies.   Shouldn't worry over covid but still does.  Life in limbo after settled worker's comp. Exercising, pray, writing and therapy with [facility]. Can awaken with panic every 2-3 days.  Distracts herself. Awoke at 2AM with panic with fear of not breathing.  Anxiety driving.   Chronic fatigue and sleepy before and worse after post Covid  Plan:  Retry gabapentin  800 mg tablet 1/2 tablet QID up to 1 tablet QID  08/23/2020 appointment with the following  noted: Increase gabapentin to 800 mg QID.  Works good for 2 hours and then seems to wear off.   NO SE.  Asks about time release.  Helped with panicky feelings and pain.   Hx RLS Anxiety is improved but not gone as noted.  When it runs out still awakens with panic but able to go back to sleep.  04/14/21 appt noted: Hydroxyzine for IC. Wants gabapentin ER and needs PA to get it.  Has taken regular gabapentin in it's place.  Regular gabapentin will not keep her asleep and awakens about 5 AM feeling panicky after 5-6 hours of sleep.  Gabapentin helps anxiety, pain and RLS but inadequate duration.  Has never taken gabapentin ER but wants to take it for greater duration and to stop withdrawal sx from the short acting gabapentin. Gabapentin helps ropinirole help RLS but it wears off as noted.  Gabapentin helps pain with better sleep until it wears off. "Tremendous" dental work repeatedly with dental problems causes anxiety.  [place] was causing dental problems.  3 more surgeries and then will be done.  Tooth pain and gum problems. Not sig depressed.  06/23/21 appt noted: PA denied for ER gabapentin bc not on formulary. Taking gabapentin 800 QID instead. Still wakens at night with anxiety and thinking of all she needs to do.  Worrying for ahwile.  Avg 5-7 hours of sleep.  Family interferes some too. Can awaken with hear racing and feeling panicky. Not depressed but anxious.  Past Psychiatric History: therapist [facility] Remote Dr. Porfirio Mylar. Dr. Koleen Distance [facility] Past Psychiatric Medication Trials: Zoloft SE HA and itching,  Paxil wt gain 30# and fatigue,  Fluoxetine NR,  Lexapro  not as good as Celexa Cymbalta SE dryness dental problems, no pain benefit Gabapentin NR pain midrange Gabatril for sleep 16 mg  Alprazolam 1 mg BID,  Buspirone SE HA  Risperidone SE Trazodone, NR Ambien and Lunesta NR  Review of Systems:  Review of Systems  Constitutional:  Positive for fatigue.  HENT:   Positive for dental problem.   Respiratory:  Positive for shortness of breath.   Musculoskeletal:  Positive for myalgias.  Neurological:  Positive for weakness. Negative for tremors.       Restless legs  Psychiatric/Behavioral:  Positive for sleep disturbance. The patient is nervous/anxious.    Medications: I have reviewed the patient's current medications.  Current Outpatient Medications  Medication Sig Dispense Refill   albuterol (PROAIR HFA) 108 (90 Base) MCG/ACT inhaler Inhale 2 puffs into the lungs every 6 (six) hours as needed for wheezing or shortness of breath. 18 g 3   ALPRAZolam (XANAX XR) 0.5 MG 24 hr tablet Take 0.5 mg by mouth in the morning and at bedtime.     amLODipine (NORVASC) 2.5 MG tablet Take 1 tablet (2.5 mg total) by mouth daily. NEED OV. 90 tablet 3   baclofen (LIORESAL) 20 MG tablet Take 10 mg by mouth as needed.     beclomethasone (QVAR REDIHALER) 40 MCG/ACT inhaler Inhale 2 puffs into the lungs 2 (two) times daily. (Patient taking differently: Inhale 2 puffs into the lungs 2 (two) times daily as needed.) 10.6 g 5   bisoprolol (ZEBETA) 10 MG tablet Take 10 mg by mouth daily.      buprenorphine (BUTRANS) 15 MCG/HR Place onto the skin once a week.     Buprenorphine HCl (BELBUCA) 450 MCG FILM Place 1 Film inside cheek in the morning and at bedtime.     cetirizine (ZYRTEC) 10 MG tablet Take 10 mg by mouth daily.      Cholecalciferol (VITAMIN D PO) Take 1 tablet by mouth daily.      citalopram (CELEXA) 40 MG tablet Take 40 mg by mouth daily.     gabapentin (NEURONTIN) 800 MG tablet Take 1 tablet (800 mg total) by mouth in the morning, at noon, in the evening, and at bedtime. TAKE 1/2 TAB 4 TIMES A DAY FOR 1 WEEK THEN 1 TAB 3 TIMES A DAY FOR 1 WEEK THEN 1 TAB 4 TIMES A DAY (Patient taking differently: Take 800 mg by mouth 4 (four) times daily.) 120 tablet 1   hydrOXYzine (ATARAX/VISTARIL) 25 MG tablet Take 25 mg by mouth as needed for anxiety.      Hyoscyamine Sulfate SL  0.125 MG SUBL Take 0.125 mg by mouth every 4 (four) hours as needed for cramping.     ibuprofen (ADVIL) 200 MG tablet Take by mouth daily as needed.     Melatonin 10 MG TABS Take 10 mg by mouth at bedtime.      Multiple Vitamin (MULTI-VITAMIN) tablet Take by mouth.     norethindrone-ethinyl estradiol (LOESTRIN FE) 1-20 MG-MCG tablet Take 1 tablet by mouth daily.     POLY-IRON 150 150 MG capsule Take 300 mg by mouth daily.     Probiotic Product (ALIGN PO) Take 1 capsule by mouth daily.      prochlorperazine (COMPAZINE) 10 MG tablet Take 10 mg by mouth every 6 (  six) hours as needed for nausea or vomiting.      promethazine (PHENERGAN) 25 MG tablet Take 25 mg by mouth every 6 (six) hours as needed for nausea or vomiting.      ranitidine (ZANTAC) 150 MG tablet Take by mouth as needed.     rOPINIRole (REQUIP) 2 MG tablet Take 2 mg by mouth at bedtime.      Spacer/Aero-Holding Chambers (AEROCHAMBER MV) inhaler Use as instructed 1 each 0   thyroid (ARMOUR) 90 MG tablet Take 120 mg by mouth daily. NP Thyroid     vitamin B-12 (CYANOCOBALAMIN) 500 MCG tablet Take 500 mcg by mouth daily.     valACYclovir (VALTREX) 1000 MG tablet Take by mouth. (Patient not taking: Reported on 06/23/2021)     No current facility-administered medications for this visit.    Medication Side Effects: None  Allergies:  Allergies  Allergen Reactions   Allegra [Fexofenadine Hcl] Shortness Of Breath and Rash   Fexofenadine Shortness Of Breath, Rash and Other (See Comments)    Shakes, can't sleep, trouble breathing   Oseltamivir Other (See Comments)    Can't breathe, sick to stomach   Tamiflu [Oseltamivir Phosphate] Itching, Nausea And Vomiting and Other (See Comments)    Can't breathe, sick to stomach   Vancomycin Other (See Comments)    Red man's Syndrome.   Diphenhydramine Other (See Comments)    Shakes, can't sleep, trouble breathing   Latex Itching, Dermatitis and Rash   Sulfamethoxazole Rash   Aspirin Nausea  And Vomiting   Doxycycline    Other     NUT ALLERGY   Diphenhydramine Hcl Palpitations and Rash   Morphine Other (See Comments)    REACTION: insomnia   Oseltamivir Phosphate Itching and Nausea And Vomiting   Penicillins Itching and Swelling    Can tolerate Amoxicillin OK.   Sulfonamide Derivatives Rash    Past Medical History:  Diagnosis Date   Anxiety    Arrhythmia    heart races when pt in pain   Asthma    Clostridium difficile infection 2014   Epstein Barr virus infection    Fibromyalgia    GERD (gastroesophageal reflux disease)    History of COVID-19    Hypertension    Hypothyroidism    Immune deficiency disorder ([place])    Lyme disease    Mold contact confirmed    Toxic Mold syndrome   Mononucleosis 5-10   Ovarian cyst    Serous Cystadenofibroma-Left ovary   PONV (postoperative nausea and vomiting)    Shortness of breath    on exertion   Systolic murmur    Thyroid disease     Family History  Problem Relation Age of Onset   Hypertension Mother    Diabetes Mother    Pulmonary embolism Mother        x2   Heart disease Mother    Breast cancer Maternal Aunt        Age 22   Pancreatic cancer Maternal Grandmother    Heart disease Maternal Grandfather    Breast cancer Paternal Grandmother        Age 90   Asthma Paternal Grandmother    Hypertension Father    Thyroid disease Father     Social History   Socioeconomic History   Marital status: Single    Spouse name: Not on file   Number of children: 0   Years of education: college   Highest education level: Not on file  Occupational History  Occupation: Pharmacist, hospital  Tobacco Use   Smoking status: Never   Smokeless tobacco: Never  Vaping Use   Vaping Use: Never used  Substance and Sexual Activity   Alcohol use: No    Alcohol/week: 0.0 standard drinks   Drug use: No   Sexual activity: Never    Birth control/protection: Pill  Other Topics Concern   Not on file  Social History Narrative   Lives at home  alone.   Right-handed.   Caffeine use: 1 cup per day.   Social Determinants of Health   Financial Resource Strain: Not on file  Food Insecurity: Not on file  Transportation Needs: Not on file  Physical Activity: Not on file  Stress: Not on file  Social Connections: Not on file  Intimate Partner Violence: Not on file    Past Medical History, Surgical history, Social history, and Family history were reviewed and updated as appropriate.   Please see review of systems for further details on the patient's review from today.   Objective:   Physical Exam:  There were no vitals taken for this visit.  Physical Exam Constitutional:      General: She is not in acute distress.    Appearance: She is obese.  Musculoskeletal:        General: No deformity.  Neurological:     Mental Status: She is alert and oriented to person, place, and time.     Coordination: Coordination normal.  Psychiatric:        Attention and Perception: Attention and perception normal. She does not perceive auditory or visual hallucinations.        Mood and Affect: Mood is anxious. Mood is not depressed. Affect is not labile, blunt, angry, tearful or inappropriate.        Speech: Speech normal. Speech is not slurred.        Behavior: Behavior normal. Behavior is not slowed.        Thought Content: Thought content normal. Thought content is not paranoid or delusional. Thought content does not include homicidal or suicidal ideation. Thought content does not include homicidal or suicidal plan.        Cognition and Memory: Cognition and memory normal.        Judgment: Judgment normal.     Comments: Insight fair Chronic stress and anxietyh    Lab Review:     Component Value Date/Time   NA 140 01/23/2020 1202   NA 142 09/09/2013 1102   K 4.2 01/23/2020 1202   K 4.3 09/09/2013 1102   CL 99 01/23/2020 1202   CO2 20 01/23/2020 1202   CO2 27 09/09/2013 1102   GLUCOSE 105 (H) 01/23/2020 1202   GLUCOSE 135 (H)  12/30/2019 0658   GLUCOSE 95 09/09/2013 1102   BUN 12 01/23/2020 1202   BUN 11.0 09/09/2013 1102   CREATININE 0.87 01/23/2020 1202   CREATININE 0.9 09/09/2013 1102   CALCIUM 9.3 01/23/2020 1202   CALCIUM 9.9 09/09/2013 1102   PROT 7.1 01/23/2020 1202   PROT 7.9 09/09/2013 1102   ALBUMIN 4.2 01/23/2020 1202   ALBUMIN 3.5 09/09/2013 1102   AST 24 01/23/2020 1202   AST 19 09/09/2013 1102   ALT 19 01/23/2020 1202   ALT 18 09/09/2013 1102   ALKPHOS 88 01/23/2020 1202   ALKPHOS 80 09/09/2013 1102   BILITOT 0.2 01/23/2020 1202   BILITOT 0.53 09/09/2013 1102   GFRNONAA 80 01/23/2020 1202   GFRAA 92 01/23/2020 1202  Component Value Date/Time   WBC 7.7 01/23/2020 1202   WBC 4.9 12/30/2019 0658   RBC 4.47 01/23/2020 1202   RBC 4.19 12/30/2019 0658   HGB 12.7 01/23/2020 1202   HGB 13.9 09/09/2013 1102   HCT 39.5 01/23/2020 1202   HCT 40.9 09/09/2013 1102   PLT 332 01/23/2020 1202   MCV 88 01/23/2020 1202   MCV 89.1 09/09/2013 1102   MCH 28.4 01/23/2020 1202   MCH 28.2 12/30/2019 0658   MCHC 32.2 01/23/2020 1202   MCHC 31.9 12/30/2019 0658   RDW 13.5 01/23/2020 1202   RDW 13.6 09/09/2013 1102   LYMPHSABS 3.1 01/23/2020 1202   LYMPHSABS 3.2 09/09/2013 1102   MONOABS 0.3 12/30/2019 0658   MONOABS 0.5 09/09/2013 1102   EOSABS 0.4 01/23/2020 1202   BASOSABS 0.1 01/23/2020 1202   BASOSABS 0.1 09/09/2013 1102    No results found for: POCLITH, LITHIUM   No results found for: PHENYTOIN, PHENOBARB, VALPROATE, CBMZ   .res Assessment: Plan:    Chailyn was seen today for follow-up, anxiety and sleeping problem.  Diagnoses and all orders for this visit:  Generalized anxiety disorder  Panic disorder with agoraphobia  Restless legs syndrome  Insomnia due to mental condition Anxiety is no better than last time with partial response to current meds:  . Intolerant of buspirone, and risperidone.  Patient is also on opiates.  Disc risk with Xanax.  We discussed the  short-term risks associated with benzodiazepines including sedation and increased fall risk among others.  Discussed long-term side effect risk including dependence, potential withdrawal symptoms, and the potential eventual dose-related risk of dementia.  But recent studies from 2020 dispute this association between benzodiazepines and dementia risk. Newer studies in 2020 do not support an association with dementia. Continue alprazolam XR 0.5 mg twice daily and citalopram 40 mg daily for now. No plans to increase alprazolam bc pt on opiate.   Options risperidone off label, switch citalopram to Viibryd, Luvox, off label Trileptal, venlafaxine, Abilify, clonidine, retry higher dose gabapentin, higher than 40 citalopram but would have to get EKG. EKG 01/14/20 QTc 512 so cannot increase citalopram. Also hesitant to use TCA for the same reason.  Gabapentin 800 QID since can't get horizant Continue citalopram 40 Trial mirtazapine 30  Discussed potential metabolic side effects associated with atypical antipsychotics, as well as potential risk for movement side effects. Advised pt to contact office if movement side effects occur.   FU 2 mos  Lynder Parents, MD, DFAPA   Please see After Visit Summary for patient specific instructions.  Future Appointments  Date Time Provider [place]  06/30/2021  3:30 PM Princess Bruins, MD [facility] None    No orders of the defined types were placed in this encounter.   -------------------------------

## 2021-06-28 ENCOUNTER — Other Ambulatory Visit: Payer: Self-pay | Admitting: Obstetrics & Gynecology

## 2021-06-28 NOTE — Telephone Encounter (Signed)
Annual exam scheduled on 06/30/21

## 2021-06-30 ENCOUNTER — Telehealth: Payer: Self-pay

## 2021-06-30 ENCOUNTER — Ambulatory Visit (INDEPENDENT_AMBULATORY_CARE_PROVIDER_SITE_OTHER): Payer: BC Managed Care – PPO | Admitting: Obstetrics & Gynecology

## 2021-06-30 ENCOUNTER — Encounter: Payer: Self-pay | Admitting: Obstetrics & Gynecology

## 2021-06-30 ENCOUNTER — Other Ambulatory Visit: Payer: Self-pay

## 2021-06-30 VITALS — BP 112/72 | HR 72 | Resp 16 | Ht 60.75 in | Wt 194.0 lb

## 2021-06-30 DIAGNOSIS — Z3041 Encounter for surveillance of contraceptive pills: Secondary | ICD-10-CM | POA: Diagnosis not present

## 2021-06-30 DIAGNOSIS — Z87440 Personal history of urinary (tract) infections: Secondary | ICD-10-CM

## 2021-06-30 DIAGNOSIS — Z8742 Personal history of other diseases of the female genital tract: Secondary | ICD-10-CM

## 2021-06-30 DIAGNOSIS — Z01419 Encounter for gynecological examination (general) (routine) without abnormal findings: Secondary | ICD-10-CM

## 2021-06-30 MED ORDER — NORETHIN ACE-ETH ESTRAD-FE 1-20 MG-MCG PO TABS: 1.0000 | ORAL_TABLET | Freq: Every day | ORAL | 5 refills | Status: DC

## 2021-06-30 MED ORDER — FLUCONAZOLE 150 MG PO TABS: 150.0000 mg | ORAL_TABLET | Freq: Every day | ORAL | 2 refills | Status: AC

## 2021-06-30 NOTE — Telephone Encounter (Signed)
Patient aware her urine showed yeast in it. Rx for diflucan was sent to the pharmacy. Urine was sent for culture.

## 2021-06-30 NOTE — Progress Notes (Addendum)
[name] 06-18-1971 [id number]   History:    50 y.o. G0 Single/[place]   RP:  Established patient presenting for annual gyn exam    HPI:  On Junel Fe 1/20 controlling patient's menometrorrhagia/dysmenorrhea/H/O Endometriosis.  No pelvic pain.  [place].  Breasts normal. Urinary frequency.  H/O Interstitial Cystitis. BMs wnl.  BMI 36.96.  Not exercising regularly. Helping her family a lot.  Fam MD for Health labs.   Past medical history,surgical history, family history and social history were all reviewed and documented in the EPIC chart.  Gynecologic History No LMP recorded. (Menstrual status: Oral contraceptives).  Obstetric History OB History  Gravida Para Term Preterm AB Living  0            SAB IAB Ectopic Multiple Live Births                ROS: A ROS was performed and pertinent positives and negatives are included in the history.  GENERAL: No fevers or chills. HEENT: No change in vision, no earache, sore throat or sinus congestion. NECK: No pain or stiffness. CARDIOVASCULAR: No chest pain or pressure. No palpitations. PULMONARY: No shortness of breath, cough or wheeze. GASTROINTESTINAL: No abdominal pain, nausea, vomiting or diarrhea, melena or bright red blood per rectum. GENITOURINARY: No urinary frequency, urgency, hesitancy or dysuria. MUSCULOSKELETAL: No joint or muscle pain, no back pain, no recent trauma. DERMATOLOGIC: No rash, no itching, no lesions. ENDOCRINE: No polyuria, polydipsia, no heat or cold intolerance. No recent change in weight. HEMATOLOGICAL: No anemia or easy bruising or bleeding. NEUROLOGIC: No headache, seizures, numbness, tingling or weakness. PSYCHIATRIC: No depression, no loss of interest in normal activity or change in sleep pattern.     Exam:   BP 112/72   Pulse 72   Resp 16   Ht 5' 0.75" (1.543 m)   Wt 194 lb (88 kg)   BMI 36.96 kg/m   Body mass index is 36.96 kg/m.  General appearance : Well developed well nourished female. No  acute distress HEENT: Eyes: no retinal hemorrhage or exudates,  Neck supple, trachea midline, no carotid bruits, no thyroidmegaly Lungs: Clear to auscultation, no rhonchi or wheezes, or rib retractions  Heart: Regular rate and rhythm, no murmurs or gallops Breast:Examined in sitting and supine position were symmetrical in appearance, no palpable masses or tenderness,  no skin retraction, no nipple inversion, no nipple discharge, no skin discoloration, no axillary or supraclavicular lymphadenopathy Abdomen: no palpable masses or tenderness, no rebound or guarding Extremities: no edema or skin discoloration or tenderness  Pelvic: Vulva: Normal             Vagina: No gross lesions or discharge  Cervix: No gross lesions or discharge  Uterus  AV, normal size, shape and consistency, non-tender and mobile  Adnexa  Without masses or tenderness  Anus: Normal  U/A:  Yeasts present.  Dark yellow cloudy, protein trace, nitrites negative, white blood cells 0-5, red blood cells negative, bacteria few.  Urine culture pending.   Assessment/Plan:  50 y.o. female for annual exam   1. Well female exam with routine gynecological exam On Junel Fe 1/20 controlling patient's menometrorrhagia/dysmenorrhea/H/O Endometriosis.  No pelvic pain.  [place].  Breasts normal. Schedule mammo now.  Urinary frequency.  H/O Interstitial Cystitis. BMs wnl.  BMI 36.96.  Not exercising regularly. Helping her family a lot.  Fam MD for Health labs.  2. Encounter for surveillance of contraceptive pills On Junel Fe 1/20 controlling patient's menometrorrhagia/dysmenorrhea/H/O Endometriosis.  No pelvic pain.  [place]. No CI to continue on the [facility] [place], prescription sent to pharmacy.  3. History of UTI Yeasts present.  Will treat with Fluconazole. - Urinalysis,Complete w/RFL Culture  4. History of endometriosis On Junel Fe 1/20 controlling patient's menometrorrhagia/dysmenorrhea/H/O Endometriosis.  No pelvic pain.  [place]. No CI to  continue on the [facility], prescription sent to pharmacy.  Other orders - buprenorphine (BUTRANS) 20 MCG/HR PTWK; 1 patch once a week. (Patient not taking: Reported on 06/30/2021) - thyroid (ARMOUR) 120 MG tablet; Take by mouth. - Cholecalciferol (VITAMIN D3 PO); Take 5,000 Int'l Units by mouth daily. - norethindrone-ethinyl estradiol-FE (JUNEL FE 1/20) 1-20 MG-MCG tablet; Take 1 tablet by mouth daily. Continuous use to control menses and endometriosis.  - fluconazole (DIFLUCAN) 150 MG tablet; Take 1 tablet (150 mg total) by mouth daily for 3 days.   Princess Bruins MD, 3:39 PM 06/30/2021

## 2021-07-01 ENCOUNTER — Encounter: Payer: Self-pay | Admitting: Obstetrics & Gynecology

## 2021-07-02 LAB — URINALYSIS, COMPLETE W/RFL CULTURE
Glucose, UA: NEGATIVE
Hgb urine dipstick: NEGATIVE
Hyaline Cast: NONE SEEN /LPF
Leukocyte Esterase: NEGATIVE
Nitrites, Initial: NEGATIVE
RBC / HPF: NONE SEEN /HPF (ref 0–2)
Specific Gravity, Urine: 1.027 (ref 1.001–1.035)
pH: 5.5 (ref 5.0–8.0)

## 2021-07-02 LAB — URINE CULTURE
MICRO NUMBER:: 12560047
SPECIMEN QUALITY:: ADEQUATE

## 2021-07-02 LAB — CULTURE INDICATED

## 2021-08-18 ENCOUNTER — Other Ambulatory Visit: Payer: Self-pay

## 2021-08-18 NOTE — Telephone Encounter (Signed)
Patient called because she is having trouble getting her bcp refill from [facility].  I did confirm with her Rx was sent at her Oct 2022 AEX for the year. I called [facility] and they did have the Rx on file and will get the refill ready for her to pick up. Patient informed.

## 2021-08-30 IMAGING — MG DIGITAL SCREENING BILAT W/ TOMO W/ CAD
8 series · 8 of 24 positions shown · non-contrast
Comparison: Previous exam(s).

CLINICAL DATA: Screening.

EXAM:
DIGITAL SCREENING BILATERAL MAMMOGRAM WITH TOMO AND CAD

[R MLO synth-2D]
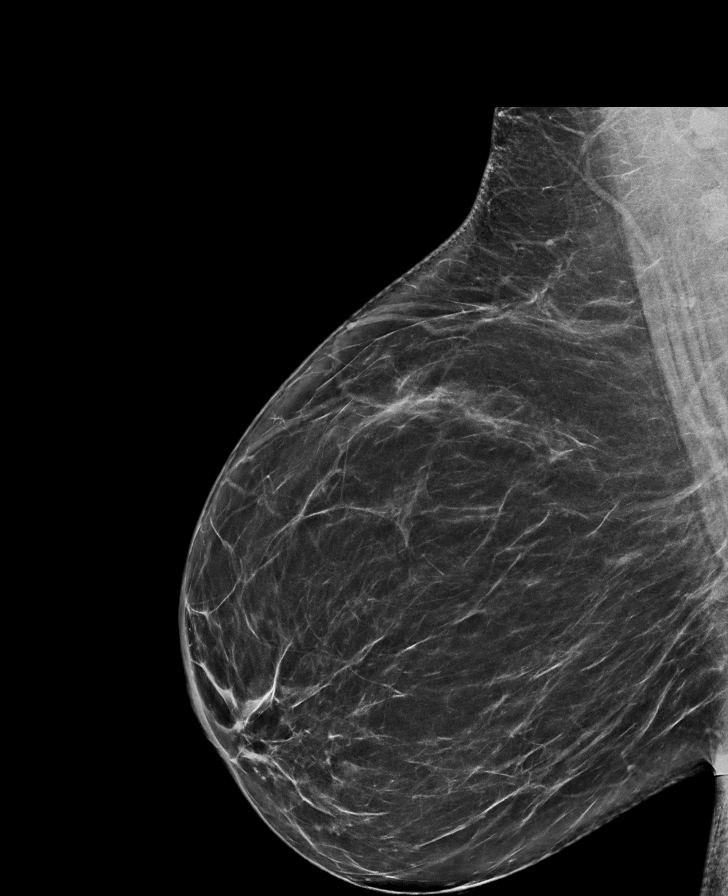

[R CC synth-2D]
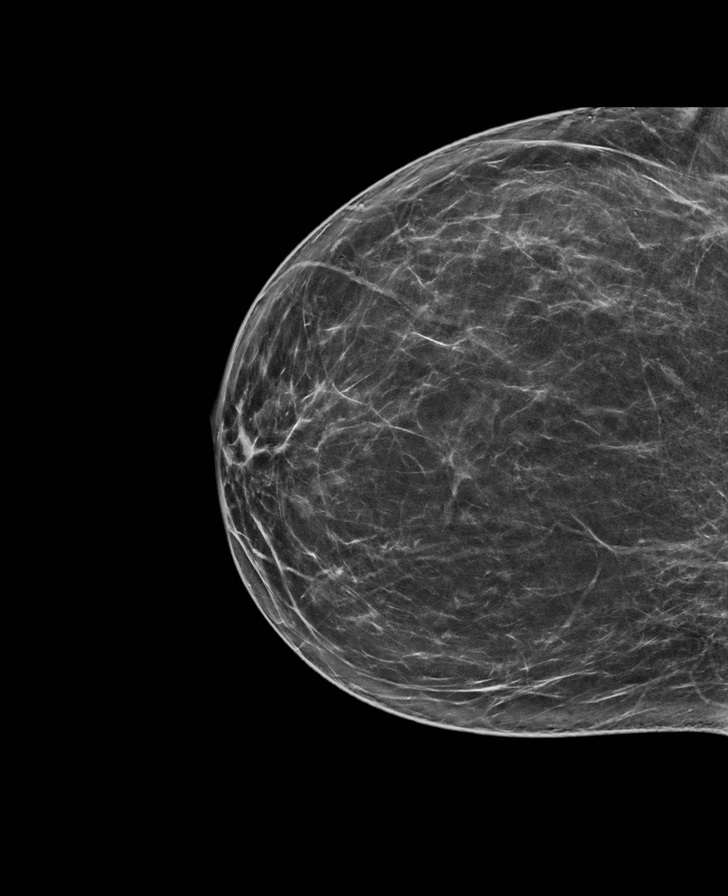

[L CC synth-2D]
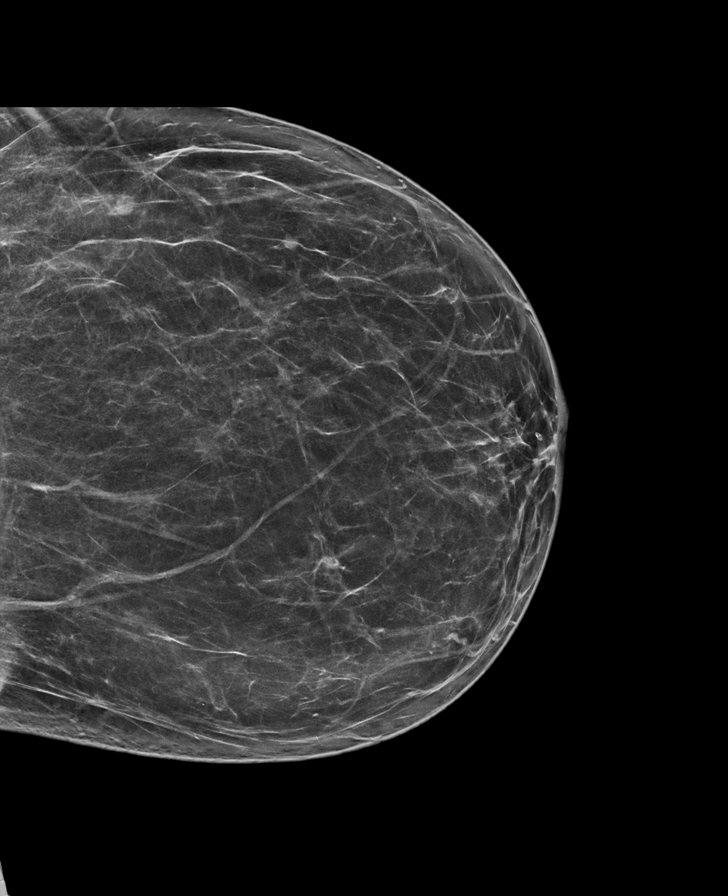

[L MLO synth-2D]
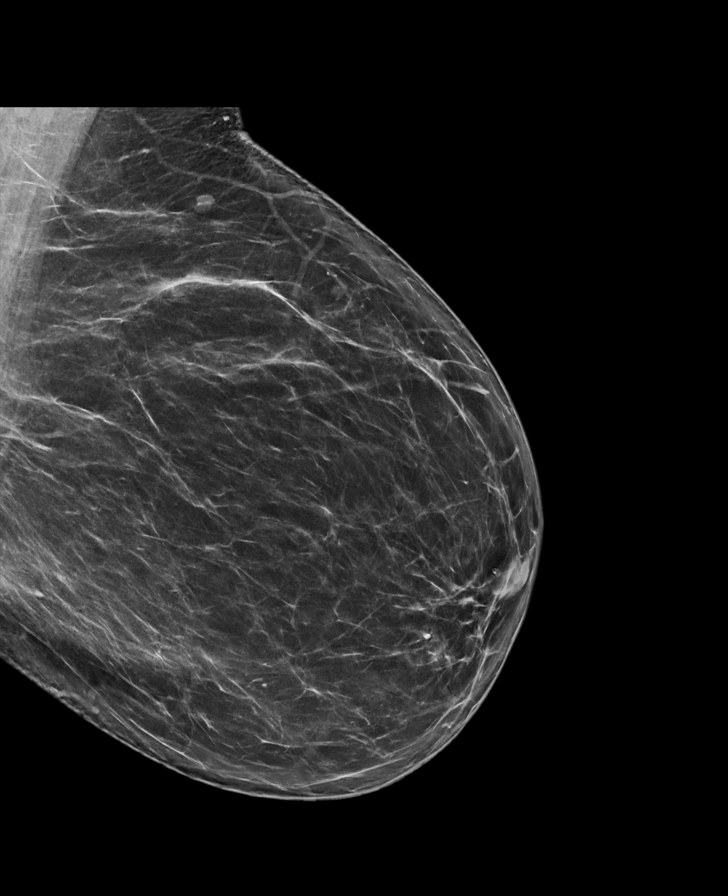

[L CC tomo · tomo slice 37/74.0]
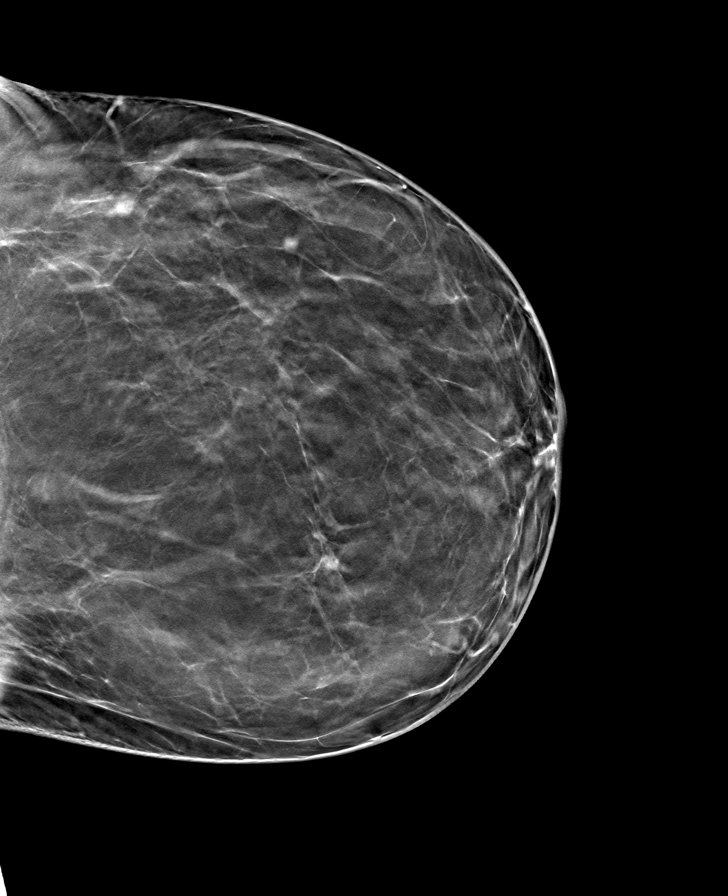

[L MLO tomo · tomo slice 43/86.0]
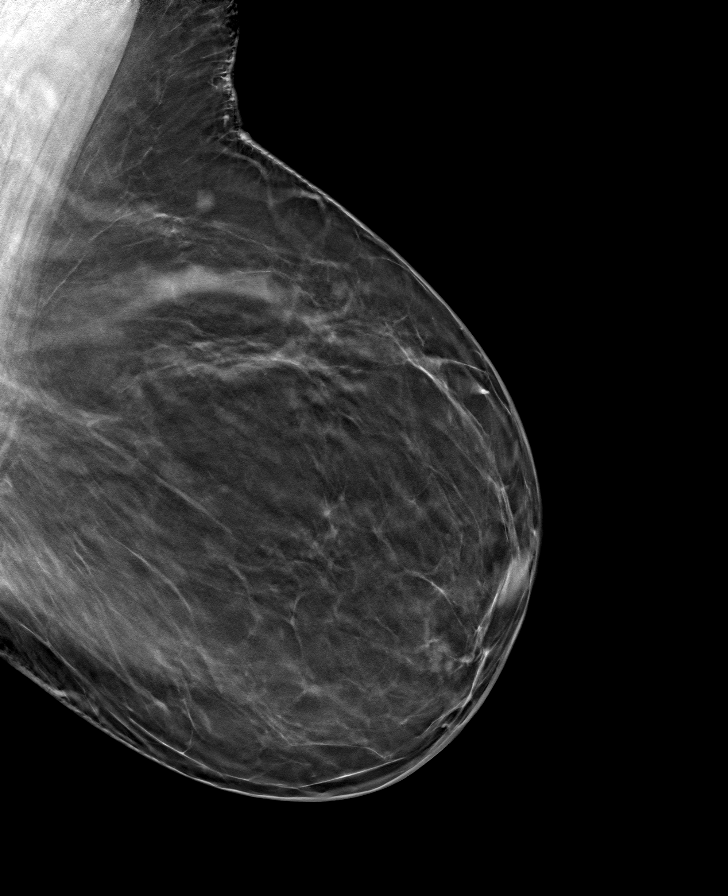

[R CC tomo · tomo slice 38/75.0]
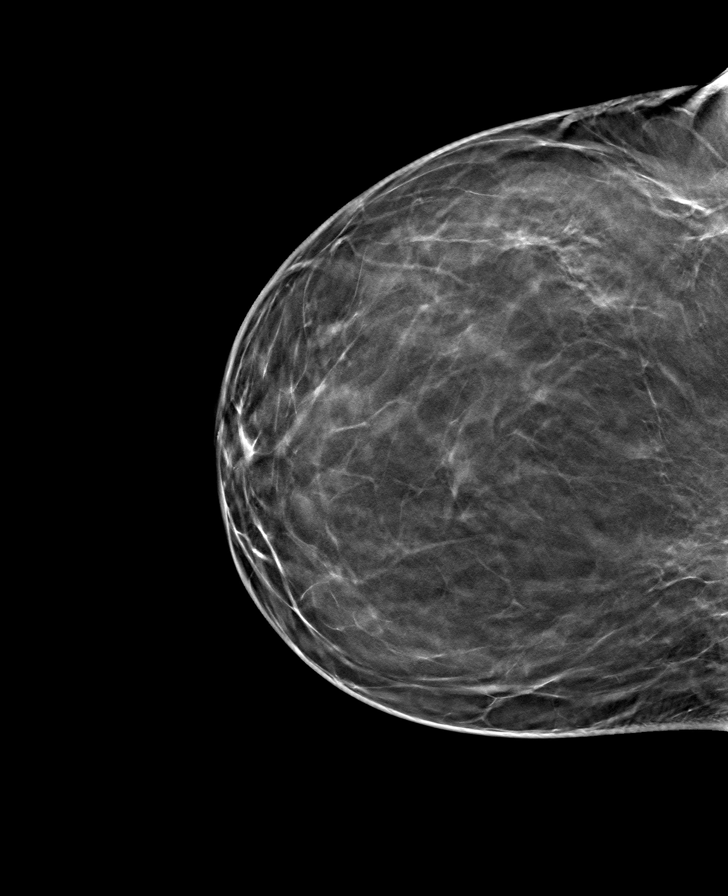

[R MLO tomo · tomo slice 44/87.0]
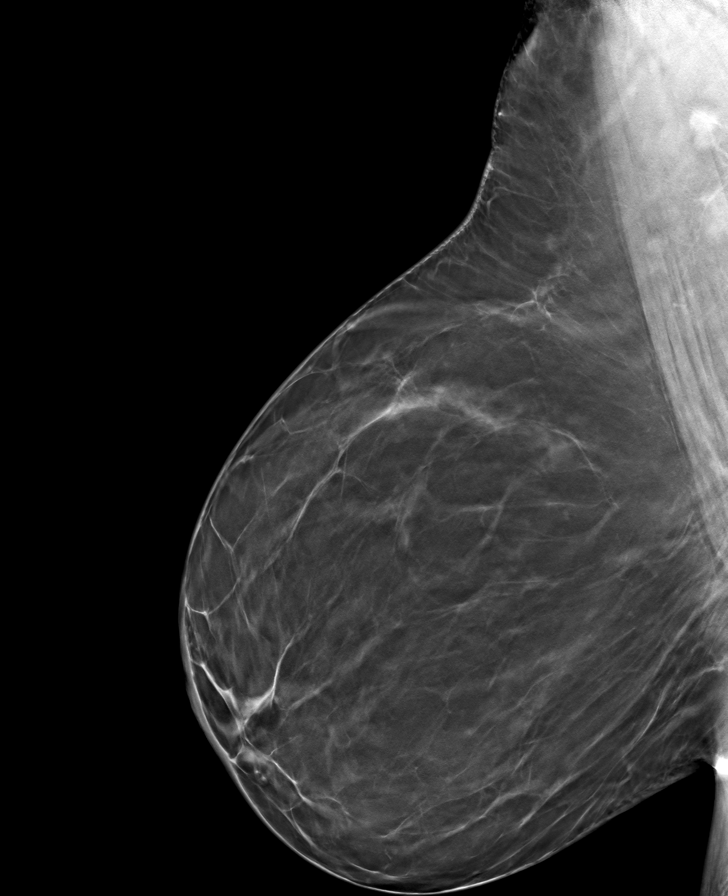

[8 of 24 positions shown; findings below may reference images not displayed]

ACR Breast Density Category b: There are scattered areas of
fibroglandular density.
FINDINGS: There are no findings suspicious for malignancy. Images were
processed with CAD.
IMPRESSION: No mammographic evidence of malignancy. A result letter of this
screening mammogram will be mailed directly to the patient.

RECOMMENDATION:
Screening mammogram in one year. (Code:[id number])

BI-RADS CATEGORY  1: Negative.

## 2021-09-01 ENCOUNTER — Encounter: Payer: Self-pay | Admitting: Psychiatry

## 2021-09-01 ENCOUNTER — Ambulatory Visit (INDEPENDENT_AMBULATORY_CARE_PROVIDER_SITE_OTHER): Payer: BC Managed Care – PPO | Admitting: Psychiatry

## 2021-09-01 ENCOUNTER — Other Ambulatory Visit: Payer: Self-pay

## 2021-09-01 DIAGNOSIS — F411 Generalized anxiety disorder: Secondary | ICD-10-CM

## 2021-09-01 DIAGNOSIS — F5105 Insomnia due to other mental disorder: Secondary | ICD-10-CM | POA: Diagnosis not present

## 2021-09-01 DIAGNOSIS — F4001 Agoraphobia with panic disorder: Secondary | ICD-10-CM

## 2021-09-01 DIAGNOSIS — G2581 Restless legs syndrome: Secondary | ICD-10-CM

## 2021-09-01 MED ORDER — CLONIDINE HCL 0.1 MG PO TABS: ORAL_TABLET | ORAL | 1 refills | Status: DC

## 2021-09-01 NOTE — Progress Notes (Signed)
[name] [id number] February 23, 1971 50 y.o.  Subjective:   Patient ID:  [name] is a 50 y.o. (DOB 1971-04-08) female.  Chief Complaint:  Chief Complaint  Patient presents with   Follow-up   Anxiety   Sleeping Problem    HPI [name] presents to the office today for follow-up of panic disorder, generalized anxiety, and insomnia.  First seen December 02, 2019. Referred by Dr. Deland Pretty.  She was on alprazolam XR 0.5 mg twice daily and citalopram 40 mg daily along with opiates at the time. Buspirone was initiated.  04/21/2020 appointment with the following noted: Covid and hosp for a week 12/26/19.  Got Remdesivir.  Finally recovering.  Still tired and aches. Steroids helped chronic pain from Lyme dz but relapsed off steroids. On opiates [place] still.  Still on Xanax XR 0.5 mg BID, citalopram 40 mg daily.  Off buspirone bc HA and shakey after a week. Still anxious about Covid.  Has to push herself to leave the house.  No full panic but panicky feelings. Still sees therapist Windle Guard. Settled worker's comp case from 10 years ago. Celexa does best of SSRI she's taken but not controlled. Normal QT interval now. Trouble with sleep sometimes. Plan: She prefers rispieridone 0.5 mg HS for a week then 1 tablet nightly if needed.  06/23/2020 appointment with the following noted: Too foggy and slowed down on risperidone 0.5 mg after a week's trial. So stopped it. Went to Post-Covid clinic yesterday with good antibodies.   Shouldn't worry over covid but still does.  Life in limbo after settled worker's comp. Exercising, pray, writing and therapy with [facility]. Can awaken with panic every 2-3 days.  Distracts herself. Awoke at 2AM with panic with fear of not breathing.  Anxiety driving.   Chronic fatigue and sleepy before and worse after post Covid  Plan:  Retry gabapentin  800 mg tablet 1/2 tablet QID up to 1 tablet QID  08/23/2020 appointment with the following  noted: Increase gabapentin to 800 mg QID.  Works good for 2 hours and then seems to wear off.   NO SE.  Asks about time release.  Helped with panicky feelings and pain.   Hx RLS Anxiety is improved but not gone as noted.  When it runs out still awakens with panic but able to go back to sleep.  04/14/21 appt noted: Hydroxyzine for IC. Wants gabapentin ER and needs PA to get it.  Has taken regular gabapentin in it's place.  Regular gabapentin will not keep her asleep and awakens about 5 AM feeling panicky after 5-6 hours of sleep.  Gabapentin helps anxiety, pain and RLS but inadequate duration.  Has never taken gabapentin ER but wants to take it for greater duration and to stop withdrawal sx from the short acting gabapentin. Gabapentin helps ropinirole help RLS but it wears off as noted.  Gabapentin helps pain with better sleep until it wears off. "Tremendous" dental work repeatedly with dental problems causes anxiety.  [place] was causing dental problems.  3 more surgeries and then will be done.  Tooth pain and gum problems. Not sig depressed.  06/23/21 appt noted: PA denied for ER gabapentin bc not on formulary. Taking gabapentin 800 QID instead. Still wakens at night with anxiety and thinking of all she needs to do.  Worrying for ahwile.  Avg 5-7 hours of sleep.  Family interferes some too. Can awaken with hear racing and feeling panicky. Not depressed but anxious. Plan:  Trial mirtazapine 30  09/01/2021 appointment with the following noted: Never got mirtazapine. 13 M and F with health problems in tandem with sister.  She does the night shifts with mother and then trouble sleeping  after it.  Parents are gettting better but still anxious.  A lot of stress.  Getting a couple of hours nap and then 4-5  hours at night.  Sleep is still irregular.  Some nights EFA.  Still RLS problems several nights per week when first tries to go to sleep about half the nights. No other med changes.    Asked about a girl in the news.     Past Psychiatric History: therapist [facility] Dr. Porfirio Mylar. Dr. Koleen Distance [facility] Past Psychiatric Medication Trials:  Zoloft SE HA and itching,  Paxil wt gain 30# and fatigue,  Fluoxetine NR,  Lexapro  not as good as Celexa Cymbalta SE dryness dental problems, no pain benefit Gabapentin NR pain midrange Gabatril for sleep 16 mg  Alprazolam 1 mg BID,  Xanax XR Buspirone SE HA  Risperidone SE Trazodone, NR Ambien and Lunesta NR Mirtazapine 30  Review of Systems:  Review of Systems  Constitutional:  Positive for fatigue.  HENT:  Positive for dental problem.   Respiratory:  Positive for shortness of breath.   Cardiovascular:  Negative for palpitations.  Musculoskeletal:  Positive for myalgias.  Neurological:  Positive for weakness. Negative for tremors.       Restless legs  Psychiatric/Behavioral:  Positive for sleep disturbance. The patient is nervous/anxious.    Medications: I have reviewed the patient's current medications.  Current Outpatient Medications  Medication Sig Dispense Refill   albuterol (PROAIR HFA) 108 (90 Base) MCG/ACT inhaler Inhale 2 puffs into the lungs every 6 (six) hours as needed for wheezing or shortness of breath. 18 g 3   ALPRAZolam (XANAX XR) 0.5 MG 24 hr tablet Take 0.5 mg by mouth in the morning and at bedtime.     amLODipine (NORVASC) 2.5 MG tablet Take 1 tablet (2.5 mg total) by mouth daily. NEED OV. 90 tablet 3   baclofen (LIORESAL) 20 MG tablet Take 10 mg by mouth as needed.     beclomethasone (QVAR REDIHALER) 40 MCG/ACT inhaler Inhale 2 puffs into the lungs 2 (two) times daily. (Patient taking differently: Inhale 2 puffs into the lungs 2 (two) times daily as needed.) 10.6 g 5   bisoprolol (ZEBETA) 10 MG tablet Take 10 mg by mouth daily.      buprenorphine (BUTRANS) 15 MCG/HR Place onto the skin once a week.     cetirizine (ZYRTEC) 10 MG tablet Take 10 mg by mouth daily.      Cholecalciferol  (VITAMIN D PO) Take 1 tablet by mouth daily.      Cholecalciferol (VITAMIN D3 PO) Take 5,000 Int'l Units by mouth daily.     citalopram (CELEXA) 40 MG tablet Take 40 mg by mouth daily.     cloNIDine (CATAPRES) 0.1 MG tablet 1/2 tablet at night for 3 nights then 1 tablet at night for 5 nights then 1/2 tablet in the AM and 1 tablet 45 tablet 1   gabapentin (NEURONTIN) 800 MG tablet Take 1 tablet (800 mg total) by mouth in the morning, at noon, in the evening, and at bedtime. TAKE 1/2 TAB 4 TIMES A DAY FOR 1 WEEK THEN 1 TAB 3 TIMES A DAY FOR 1 WEEK THEN 1 TAB 4 TIMES A DAY (Patient taking differently: Take 800 mg by mouth 4 (four)  times daily.) 120 tablet 1   hydrOXYzine (ATARAX/VISTARIL) 25 MG tablet Take 25 mg by mouth as needed for anxiety.      Hyoscyamine Sulfate SL 0.125 MG SUBL Take 0.125 mg by mouth every 4 (four) hours as needed for cramping.     ibuprofen (ADVIL) 200 MG tablet Take by mouth daily as needed.     Melatonin 10 MG TABS Take 10 mg by mouth at bedtime.      Multiple Vitamin (MULTI-VITAMIN) tablet Take by mouth.     norethindrone-ethinyl estradiol-FE (JUNEL FE 1/20) 1-20 MG-MCG tablet Take 1 tablet by mouth daily. Continuous use to control menses and endometriosis. 84 tablet 5   OVER THE COUNTER MEDICATION Floridex-use as directed 10 ml BID     POLY-IRON 150 150 MG capsule Take 300 mg by mouth daily.     Probiotic Product (ALIGN PO) Take 1 capsule by mouth as needed.     prochlorperazine (COMPAZINE) 10 MG tablet Take 10 mg by mouth every 6 (six) hours as needed for nausea or vomiting.      promethazine (PHENERGAN) 25 MG tablet Take 25 mg by mouth every 6 (six) hours as needed for nausea or vomiting.      ranitidine (ZANTAC) 150 MG tablet Take by mouth as needed.     rOPINIRole (REQUIP) 2 MG tablet Take 2 mg by mouth at bedtime.      Spacer/Aero-Holding Chambers (AEROCHAMBER MV) inhaler Use as instructed 1 each 0   thyroid (ARMOUR) 120 MG tablet Take by mouth.     vitamin B-12  (CYANOCOBALAMIN) 500 MCG tablet Take 500 mcg by mouth daily.     valACYclovir (VALTREX) 1000 MG tablet Take by mouth. (Patient not taking: Reported on 06/30/2021)     No current facility-administered medications for this visit.    Medication Side Effects: None  Allergies:  Allergies  Allergen Reactions   Allegra [Fexofenadine Hcl] Shortness Of Breath and Rash   Fexofenadine Shortness Of Breath, Rash and Other (See Comments)    Shakes, can't sleep, trouble breathing   Oseltamivir Other (See Comments)    Can't breathe, sick to stomach   Tamiflu [Oseltamivir Phosphate] Itching, Nausea And Vomiting and Other (See Comments)    Can't breathe, sick to stomach   Vancomycin Other (See Comments)    Red man's Syndrome.   Diphenhydramine Other (See Comments)    Shakes, can't sleep, trouble breathing   Latex Itching, Dermatitis and Rash   Sulfamethoxazole Rash   Aspirin Nausea And Vomiting   Doxycycline    Other     NUT ALLERGY   Diphenhydramine Hcl Palpitations and Rash   Morphine Other (See Comments)    REACTION: insomnia   Oseltamivir Phosphate Itching and Nausea And Vomiting   Penicillins Itching and Swelling    Can tolerate Amoxicillin OK.   Sulfonamide Derivatives Rash    Past Medical History:  Diagnosis Date   Anxiety    Arrhythmia    heart races when pt in pain   Asthma    Clostridium difficile infection 2014   Epstein Barr virus infection    Fibromyalgia    GERD (gastroesophageal reflux disease)    History of COVID-19    Hypertension    Hypothyroidism    Immune deficiency disorder ([place])    Lyme disease    Mold contact confirmed    Toxic Mold syndrome   Mononucleosis 5-10   Ovarian cyst    Serous Cystadenofibroma-Left ovary   PONV (postoperative nausea and vomiting)  Shortness of breath    on exertion   Systolic murmur    Thyroid disease     Family History  Problem Relation Age of Onset   Hypertension Mother    Diabetes Mother    Pulmonary embolism  Mother        x2   Heart disease Mother    Breast cancer Maternal Aunt        Age 64   Pancreatic cancer Maternal Grandmother    Heart disease Maternal Grandfather    Breast cancer Paternal Grandmother        Age 25   Asthma Paternal Grandmother    Hypertension Father    Thyroid disease Father     Social History   Socioeconomic History   Marital status: Single    Spouse name: Not on file   Number of children: 0   Years of education: college   Highest education level: Not on file  Occupational History   Occupation: Pharmacist, hospital  Tobacco Use   Smoking status: Never   Smokeless tobacco: Never  Vaping Use   Vaping Use: Never used  Substance and Sexual Activity   Alcohol use: No    Alcohol/week: 0.0 standard drinks   Drug use: No   Sexual activity: Not Currently    Birth control/protection: OCP  Other Topics Concern   Not on file  Social History Narrative   Lives at home alone.   Right-handed.   Caffeine use: 1 cup per day.   Social Determinants of Health   Financial Resource Strain: Not on file  Food Insecurity: Not on file  Transportation Needs: Not on file  Physical Activity: Not on file  Stress: Not on file  Social Connections: Not on file  Intimate Partner Violence: Not on file    Past Medical History, Surgical history, Social history, and Family history were reviewed and updated as appropriate.   Please see review of systems for further details on the patient's review from today.   Objective:   Physical Exam:  There were no vitals taken for this visit.  Physical Exam Constitutional:      General: She is not in acute distress.    Appearance: She is obese.  Musculoskeletal:        General: No deformity.  Neurological:     Mental Status: She is alert and oriented to person, place, and time.     Coordination: Coordination normal.  Psychiatric:        Attention and Perception: Attention and perception normal. She does not perceive auditory or visual  hallucinations.        Mood and Affect: Mood is anxious. Mood is not depressed. Affect is not labile, blunt, angry, tearful or inappropriate.        Speech: Speech normal. Speech is not slurred.        Behavior: Behavior normal. Behavior is not slowed.        Thought Content: Thought content normal. Thought content is not paranoid or delusional. Thought content does not include homicidal or suicidal ideation. Thought content does not include suicidal plan.        Cognition and Memory: Cognition and memory normal.        Judgment: Judgment normal.     Comments: Insight fair Chronic stress and anxiety ongoing including about meds.    Lab Review:     Component Value Date/Time   NA 140 01/23/2020 1202   NA 142 09/09/2013 1102   K 4.2 01/23/2020 1202  K 4.3 09/09/2013 1102   CL 99 01/23/2020 1202   CO2 20 01/23/2020 1202   CO2 27 09/09/2013 1102   GLUCOSE 105 (H) 01/23/2020 1202   GLUCOSE 135 (H) 12/30/2019 0658   GLUCOSE 95 09/09/2013 1102   BUN 12 01/23/2020 1202   BUN 11.0 09/09/2013 1102   CREATININE 0.87 01/23/2020 1202   CREATININE 0.9 09/09/2013 1102   CALCIUM 9.3 01/23/2020 1202   CALCIUM 9.9 09/09/2013 1102   PROT 7.1 01/23/2020 1202   PROT 7.9 09/09/2013 1102   ALBUMIN 4.2 01/23/2020 1202   ALBUMIN 3.5 09/09/2013 1102   AST 24 01/23/2020 1202   AST 19 09/09/2013 1102   ALT 19 01/23/2020 1202   ALT 18 09/09/2013 1102   ALKPHOS 88 01/23/2020 1202   ALKPHOS 80 09/09/2013 1102   BILITOT 0.2 01/23/2020 1202   BILITOT 0.53 09/09/2013 1102   GFRNONAA 80 01/23/2020 1202   GFRAA 92 01/23/2020 1202       Component Value Date/Time   WBC 7.7 01/23/2020 1202   WBC 4.9 12/30/2019 0658   RBC 4.47 01/23/2020 1202   RBC 4.19 12/30/2019 0658   HGB 12.7 01/23/2020 1202   HGB 13.9 09/09/2013 1102   HCT 39.5 01/23/2020 1202   HCT 40.9 09/09/2013 1102   PLT 332 01/23/2020 1202   MCV 88 01/23/2020 1202   MCV 89.1 09/09/2013 1102   MCH 28.4 01/23/2020 1202   MCH 28.2  12/30/2019 0658   MCHC 32.2 01/23/2020 1202   MCHC 31.9 12/30/2019 0658   RDW 13.5 01/23/2020 1202   RDW 13.6 09/09/2013 1102   LYMPHSABS 3.1 01/23/2020 1202   LYMPHSABS 3.2 09/09/2013 1102   MONOABS 0.3 12/30/2019 0658   MONOABS 0.5 09/09/2013 1102   EOSABS 0.4 01/23/2020 1202   BASOSABS 0.1 01/23/2020 1202   BASOSABS 0.1 09/09/2013 1102    No results found for: POCLITH, LITHIUM   No results found for: PHENYTOIN, PHENOBARB, VALPROATE, CBMZ   .res Assessment: Plan:    Mikenzi was seen today for follow-up, anxiety and sleeping problem.  Diagnoses and all orders for this visit:  Generalized anxiety disorder -     cloNIDine (CATAPRES) 0.1 MG tablet; 1/2 tablet at night for 3 nights then 1 tablet at night for 5 nights then 1/2 tablet in the AM and 1 tablet  Panic disorder with agoraphobia -     cloNIDine (CATAPRES) 0.1 MG tablet; 1/2 tablet at night for 3 nights then 1 tablet at night for 5 nights then 1/2 tablet in the AM and 1 tablet  Restless legs syndrome -     cloNIDine (CATAPRES) 0.1 MG tablet; 1/2 tablet at night for 3 nights then 1 tablet at night for 5 nights then 1/2 tablet in the AM and 1 tablet  Insomnia due to mental condition -     cloNIDine (CATAPRES) 0.1 MG tablet; 1/2 tablet at night for 3 nights then 1 tablet at night for 5 nights then 1/2 tablet in the AM and 1 tablet  Anxiety is no better than last time with partial response to current meds:  . Intolerant of buspirone, and risperidone.  Patient is also on opiates.  Disc risk with Xanax.  We discussed the short-term risks associated with benzodiazepines including sedation and increased fall risk among others.  Discussed long-term side effect risk including dependence, potential withdrawal symptoms, and the potential eventual dose-related risk of dementia.  But recent studies from 2020 dispute this association between benzodiazepines and dementia risk. Newer studies  in 2020 do not support an association with  dementia. Continue alprazolam XR 0.5 mg twice daily and citalopram 40 mg daily for now. No plans to increase alprazolam bc pt on opiate.   Options risperidone off label, switch citalopram to Viibryd, Luvox, off label Trileptal, venlafaxine, Abilify, clonidine, retry higher dose gabapentin, higher than 40 citalopram but would have to get EKG. EKG 01/14/20 QTc 512 so cannot increase citalopram. Also hesitant to use TCA for the same reason.  Gabapentin 800 QID since can't get horizant Continue citalopram 40  Hold mirtazapine 30  Clonidine 0.1 mg start 1/2 tablet at night and gradually increase to 1/2 in the AM and 1 at night for sleep and anxiety and perimenopausal  sweating off label. Call if not effective and we can increase the dose.  She'll monitor BP   Discussed potential metabolic side effects associated with atypical antipsychotics, as well as potential risk for movement side effects. Advised pt to contact office if movement side effects occur.   FU 2 mos  Lynder Parents, MD, DFAPA   Please see After Visit Summary for patient specific instructions.  Future Appointments  Date Time Provider [place]  07/05/2022  2:30 PM Princess Bruins, MD [facility] None    No orders of the defined types were placed in this encounter.    -------------------------------

## 2021-09-15 ENCOUNTER — Telehealth: Payer: Self-pay | Admitting: Psychiatry

## 2021-09-15 NOTE — Telephone Encounter (Signed)
Please clarify the directions on the 12/29 RX for Clonidine. Looks like the directions were cut off.  Call [facility] to confirm. Thx

## 2021-09-15 NOTE — Telephone Encounter (Signed)
I spoke with Cheri Rous and went over the correct instructions.

## 2021-10-08 ENCOUNTER — Other Ambulatory Visit: Payer: Self-pay | Admitting: Psychiatry

## 2021-10-08 DIAGNOSIS — G2581 Restless legs syndrome: Secondary | ICD-10-CM

## 2021-10-08 DIAGNOSIS — F5105 Insomnia due to other mental disorder: Secondary | ICD-10-CM

## 2021-10-08 DIAGNOSIS — F4001 Agoraphobia with panic disorder: Secondary | ICD-10-CM

## 2021-10-08 DIAGNOSIS — F411 Generalized anxiety disorder: Secondary | ICD-10-CM

## 2021-10-20 ENCOUNTER — Other Ambulatory Visit: Payer: Self-pay | Admitting: Psychiatry

## 2021-10-20 DIAGNOSIS — F411 Generalized anxiety disorder: Secondary | ICD-10-CM

## 2021-10-20 DIAGNOSIS — F4001 Agoraphobia with panic disorder: Secondary | ICD-10-CM

## 2021-11-01 ENCOUNTER — Ambulatory Visit: Payer: BC Managed Care – PPO | Admitting: Psychiatry

## 2021-11-11 ENCOUNTER — Other Ambulatory Visit: Payer: Self-pay | Admitting: Psychiatry

## 2021-11-11 DIAGNOSIS — F4001 Agoraphobia with panic disorder: Secondary | ICD-10-CM

## 2021-11-11 DIAGNOSIS — F411 Generalized anxiety disorder: Secondary | ICD-10-CM

## 2021-11-11 DIAGNOSIS — G2581 Restless legs syndrome: Secondary | ICD-10-CM

## 2021-11-11 DIAGNOSIS — F5105 Insomnia due to other mental disorder: Secondary | ICD-10-CM

## 2021-11-23 ENCOUNTER — Ambulatory Visit: Payer: BC Managed Care – PPO | Admitting: Psychiatry

## 2021-11-25 ENCOUNTER — Other Ambulatory Visit: Payer: Self-pay | Admitting: Psychiatry

## 2021-11-25 DIAGNOSIS — F4001 Agoraphobia with panic disorder: Secondary | ICD-10-CM

## 2021-11-25 DIAGNOSIS — F5105 Insomnia due to other mental disorder: Secondary | ICD-10-CM

## 2021-11-25 DIAGNOSIS — F411 Generalized anxiety disorder: Secondary | ICD-10-CM

## 2021-11-25 DIAGNOSIS — G2581 Restless legs syndrome: Secondary | ICD-10-CM

## 2021-12-26 ENCOUNTER — Ambulatory Visit (INDEPENDENT_AMBULATORY_CARE_PROVIDER_SITE_OTHER): Payer: No Typology Code available for payment source | Admitting: Psychiatry

## 2021-12-26 ENCOUNTER — Encounter: Payer: Self-pay | Admitting: Psychiatry

## 2021-12-26 DIAGNOSIS — F411 Generalized anxiety disorder: Secondary | ICD-10-CM

## 2021-12-26 DIAGNOSIS — F4001 Agoraphobia with panic disorder: Secondary | ICD-10-CM | POA: Diagnosis not present

## 2021-12-26 DIAGNOSIS — G2581 Restless legs syndrome: Secondary | ICD-10-CM

## 2021-12-26 NOTE — Progress Notes (Signed)
Pershing Cox ?[id number] ?1970-11-28 ?51 y.o. ? ?Subjective:  ? ?Patient ID:  [name] is a 51 y.o. (DOB [birth year]) female. ? ?Chief Complaint:  ?Chief Complaint  ?Patient presents with  ? Follow-up  ? Anxiety  ? Sleeping Problem  ? ? ?HPI ?Pershing Cox presents to the office today for follow-up of panic disorder, generalized anxiety, and insomnia.  First seen December 02, 2019. ?Referred by Dr. Deland Pretty.  She was on alprazolam XR 0.5 mg twice daily and citalopram 40 mg daily along with opiates at the time. ?Buspirone was initiated. ? ?04/21/2020 appointment with the following noted: ?Covid and hosp for a week 12/26/19.  Got Remdesivir.  Finally recovering.  Still tired and aches. Steroids helped chronic pain from Lyme dz but relapsed off steroids. ?On opiates [place] still.  Still on Xanax XR 0.5 mg BID, citalopram 40 mg daily.  Off buspirone bc HA and shakey after a week. ?Still anxious about Covid.  Has to push herself to leave the house.  No full panic but panicky feelings. ?Still sees therapist Windle Guard. ?Settled worker's comp case from 10 years ago. ?Celexa does best of SSRI she's taken but not controlled. ?Normal QT interval now. ?Trouble with sleep sometimes. ?Plan: She prefers rispieridone 0.5 mg HS for a week then 1 tablet nightly if needed. ? ?06/23/2020 appointment with the following noted: ?Too foggy and slowed down on risperidone 0.5 mg after a week's trial. So stopped it. ?Went to Post-Covid clinic yesterday with good antibodies.   Shouldn't worry over covid but still does.  Life in limbo after settled worker's comp. ?Exercising, pray, writing and therapy with [facility]. ?Can awaken with panic every 2-3 days.  Distracts herself. Awoke at 2AM with panic with fear of not breathing.  Anxiety driving.   ?Chronic fatigue and sleepy before and worse after post Covid  ?Plan:  Retry gabapentin  800 mg tablet 1/2 tablet QID up to 1 tablet QID ? ?08/23/2020 appointment with the following  noted: ?Increase gabapentin to 800 mg QID.  Works good for 2 hours and then seems to wear off.   NO SE.  Asks about time release.  Helped with panicky feelings and pain.   ?Hx RLS ?Anxiety is improved but not gone as noted.  When it runs out still awakens with panic but able to go back to sleep. ? ?04/14/21 appt noted: ?Hydroxyzine for IC. ?Wants gabapentin ER and needs PA to get it.  Has taken regular gabapentin in it's place.  Regular gabapentin will not keep her asleep and awakens about 5 AM feeling panicky after 5-6 hours of sleep.  Gabapentin helps anxiety, pain and RLS but inadequate duration.  Has never taken gabapentin ER but wants to take it for greater duration and to stop withdrawal sx from the short acting gabapentin. ?Gabapentin helps ropinirole help RLS but it wears off as noted.  Gabapentin helps pain with better sleep until it wears off. ?"Tremendous" dental work repeatedly with dental problems causes anxiety.  [place] was causing dental problems.  3 more surgeries and then will be done.  Tooth pain and gum problems. ?Not sig depressed. ? ?06/23/21 appt noted: ?PA denied for ER gabapentin bc not on formulary. ?Taking gabapentin 800 QID instead. Still wakens at night with anxiety and thinking of all she needs to do.  Worrying for ahwile.  Avg 5-7 hours of sleep.  Family interferes some too. ?Can awaken with hear racing and feeling panicky. ?Not depressed but anxious. ?Plan:  Trial mirtazapine 30 ? ?09/01/2021 appointment with the following noted: ?Never got mirtazapine. ?78 M and F with health problems in tandem with sister.  She does the night shifts with mother and then trouble sleeping  after it.  Parents are gettting better but still anxious.  A lot of stress.  Getting a couple of hours nap and then 4-5  hours at night.  Sleep is still irregular.  Some nights EFA.  ?Still RLS problems several nights per week when first tries to go to sleep about half the nights. ?No other med changes.    ?Asked about a girl in the news.   ?Plan: Gabapentin 800 QID since can't get horizant ?Continue citalopram 40 ?Hold mirtazapine 30 ?Clonidine 0.1 mg start 1/2 tablet at night and gradually increase to 1/2 in the AM and 1 at night for sleep and anxiety and perimenopausal  sweating off label. ? ?12/26/2021 appointment with the following noted: ?Pending sleep repeat study bc witnessed loud snoring and periods of apnea and excessive daytime drowsiness regardless of sleep quantity. Witnessed in sleep clinic by staff while there with mother in sleep study.  Health issues..  Will talk to Dr. Shelia Media. ?When increased clonidine to 0.1 mg HA so reduced to 0.05 mg HS ?Still sweats but not as heavy and BP better with clonidine. ?Helps care for M with pulm fibrosis and hip fx.  Stress with that.  Worry over mother.   ? ? ?Past Psychiatric History: therapist Windle Guard ?Remote Dr. Porfirio Mylar. ?Dr. [facility] ?Past Psychiatric Medication Trials:  ?Zoloft SE HA and itching,  ?Paxil wt gain 30# and fatigue,  ?Fluoxetine NR,  ?Lexapro  not as good as Celexa ?Cymbalta SE dryness dental problems, no pain benefit ?Gabapentin NR pain midrange ?Gabatril for sleep 16 mg  ?Alprazolam 1 mg BID,  ?Xanax XR ?Buspirone SE HA  ?Risperidone SE ?Trazodone, NR ?Ambien and Lunesta NR ?Mirtazapine 30 never taken ? ?Review of Systems:  ?Review of Systems  ?Constitutional:  Positive for fatigue.  ?HENT:  Positive for dental problem.   ?Respiratory:  Positive for shortness of breath.   ?Cardiovascular:  Negative for chest pain and palpitations.  ?Musculoskeletal:  Positive for myalgias.  ?Neurological:  Positive for weakness. Negative for tremors.  ?     Restless legs  ?Psychiatric/Behavioral:  Positive for sleep disturbance. The patient is nervous/anxious.   ? ?Medications: I have reviewed the patient's current medications. ? ?Current Outpatient Medications  ?Medication Sig Dispense Refill  ? albuterol (PROAIR HFA) 108 (90 Base) MCG/ACT inhaler  Inhale 2 puffs into the lungs every 6 (six) hours as needed for wheezing or shortness of breath. 18 g 3  ? ALPRAZolam (XANAX XR) 0.5 MG 24 hr tablet Take 0.5 mg by mouth in the morning and at bedtime.    ? amLODipine (NORVASC) 2.5 MG tablet Take 1 tablet (2.5 mg total) by mouth daily. NEED OV. 90 tablet 3  ? baclofen (LIORESAL) 20 MG tablet Take 10 mg by mouth as needed.    ? beclomethasone (QVAR REDIHALER) 40 MCG/ACT inhaler Inhale 2 puffs into the lungs 2 (two) times daily. (Patient taking differently: Inhale 2 puffs into the lungs 2 (two) times daily as needed.) 10.6 g 5  ? bisoprolol (ZEBETA) 10 MG tablet Take 10 mg by mouth daily.     ? buprenorphine (BUTRANS) 15 MCG/HR Place onto the skin once a week.    ? cetirizine (ZYRTEC) 10 MG tablet Take 10 mg by mouth daily.     ?  Cholecalciferol (VITAMIN D PO) Take 1 tablet by mouth daily.     ? Cholecalciferol (VITAMIN D3 PO) Take 5,000 Int'l Units by mouth daily.    ? citalopram (CELEXA) 40 MG tablet Take 40 mg by mouth daily.    ? cloNIDine (CATAPRES) 0.1 MG tablet TAKE 1/2 TAB IN MORNING AND 1 TAB AT BEDTIME 135 tablet 0  ? gabapentin (NEURONTIN) 800 MG tablet Take 1 tablet (800 mg total) by mouth in the morning, at noon, in the evening, and at bedtime. 120 tablet 1  ? hydrOXYzine (ATARAX/VISTARIL) 25 MG tablet Take 25 mg by mouth as needed for anxiety.     ? Hyoscyamine Sulfate SL 0.125 MG SUBL Take 0.125 mg by mouth every 4 (four) hours as needed for cramping.    ? ibuprofen (ADVIL) 200 MG tablet Take by mouth daily as needed.    ? Melatonin 10 MG TABS Take 10 mg by mouth at bedtime.     ? Multiple Vitamin (MULTI-VITAMIN) tablet Take by mouth.    ? norethindrone-ethinyl estradiol-FE (JUNEL FE 1/20) 1-20 MG-MCG tablet Take 1 tablet by mouth daily. Continuous use to control menses and endometriosis. 84 tablet 5  ? OVER THE COUNTER MEDICATION Floridex-use as directed 10 ml BID    ? POLY-IRON 150 150 MG capsule Take 300 mg by mouth daily.    ? Probiotic Product  (ALIGN PO) Take 1 capsule by mouth as needed.    ? prochlorperazine (COMPAZINE) 10 MG tablet Take 10 mg by mouth every 6 (six) hours as needed for nausea or vomiting.     ? promethazine (PHENERGAN) 25 MG tablet Take 25 mg by m

## 2022-02-14 ENCOUNTER — Ambulatory Visit: Payer: No Typology Code available for payment source | Admitting: Podiatry

## 2022-02-14 ENCOUNTER — Ambulatory Visit (INDEPENDENT_AMBULATORY_CARE_PROVIDER_SITE_OTHER): Payer: No Typology Code available for payment source

## 2022-02-14 DIAGNOSIS — M2041 Other hammer toe(s) (acquired), right foot: Secondary | ICD-10-CM

## 2022-02-14 DIAGNOSIS — S99921A Unspecified injury of right foot, initial encounter: Secondary | ICD-10-CM

## 2022-02-14 NOTE — Progress Notes (Signed)
HPI: 51 y.o. female presenting today for evaluation of an injury that was sustained to the right great toe when she kicked a wall in the middle the night.  Patient has had pain and tenderness to the right great toe ever since.  DOI: 02/07/2022.  Patient states of the last 2 days there has been significant improvement and she does not experience the throbbing sensations anymore.  She is concerned for fracture of the toe  Past Medical History:  Diagnosis Date   Anxiety    Arrhythmia    heart races when pt in pain   Asthma    Clostridium difficile infection 2014   Epstein Barr virus infection    Fibromyalgia    GERD (gastroesophageal reflux disease)    History of COVID-19    Hypertension    Hypothyroidism    Immune deficiency disorder ([place])    Lyme disease    Mold contact confirmed    Toxic Mold syndrome   Mononucleosis 5-10   Ovarian cyst    Serous Cystadenofibroma-Left ovary   PONV (postoperative nausea and vomiting)    Shortness of breath    on exertion   Systolic murmur    Thyroid disease     Past Surgical History:  Procedure Laterality Date   BLADDER SURGERY     CHOLECYSTECTOMY     FECAL TRANSPLANT     Groin cyst     LAPAROSCOPY Right 06/12/2013   Procedure: LAPAROSCOPY OPERATIVE  fulgeration of endometriosis, right salpingooophorectomy;  Surgeon: Terrance Mass, MD;  Location: [facility];  Service: Gynecology;  Laterality: Right;   Nose cyst     OOPHORECTOMY Right 06/12/2013   Procedure: OOPHORECTOMY;POSS RIGHT [place];  Surgeon: Terrance Mass, MD;  Location: [facility];  Service: Gynecology;  Laterality: Right;   PELVIC LAPAROSCOPY     DL Ovarian cystectomy-Left   TONSILLECTOMY  2009    Allergies  Allergen Reactions   Allegra [Fexofenadine Hcl] Shortness Of Breath and Rash   Fexofenadine Shortness Of Breath, Rash and Other (See Comments)    Shakes, can't sleep, trouble breathing   Oseltamivir Other (See Comments)    Can't breathe, sick to stomach    Tamiflu [Oseltamivir Phosphate] Itching, Nausea And Vomiting and Other (See Comments)    Can't breathe, sick to stomach   Vancomycin Other (See Comments)    Red man's Syndrome.   Diphenhydramine Other (See Comments)    Shakes, can't sleep, trouble breathing   Latex Itching, Dermatitis and Rash   Sulfamethoxazole Rash   Aspirin Nausea And Vomiting   Doxycycline    Other     NUT ALLERGY   Diphenhydramine Hcl Palpitations and Rash   Morphine Other (See Comments)    REACTION: insomnia   Oseltamivir Phosphate Itching and Nausea And Vomiting   Penicillins Itching and Swelling    Can tolerate Amoxicillin OK.   Sulfonamide Derivatives Rash     Physical Exam: General: The patient is alert and oriented x3 in no acute distress.  Dermatology: Subungual hematoma noted to the right hallux nail plate along the base  Vascular: Palpable pedal pulses bilaterally. Capillary refill within normal limits.  Negative for any significant edema or erythema  Neurological: Light touch and protective threshold grossly intact  Musculoskeletal Exam: No pedal deformities noted.  There is some mild inflammation with bruising around the base of the nail plate right hallux.  Clinically no indication of infection  Radiographic Exam:  Normal osseous mineralization. Joint spaces preserved. No fracture/dislocation/boney destruction.  Assessment: 1.  Trauma right hallux nail plate with subungual hematoma   Plan of Care:  1. Patient evaluated. X-Rays reviewed.  2.  The patient's symptoms have improved significantly over the past few days.  For now we are going to pursue conservative treatment and management 3.  Recommend wearing shoes and sandals that do not irritate the toenail 4.  I explained to the patient that the nail plate may eventually come off as a new nail grows in 5.  Return to clinic as needed      Edrick Kins, DPM [facility]  Dr. Edrick Kins, DPM    [address], [place] [address]                Office [phone]  Fax [phone]

## 2022-02-18 ENCOUNTER — Other Ambulatory Visit: Payer: Self-pay | Admitting: Obstetrics & Gynecology

## 2022-02-20 NOTE — Telephone Encounter (Signed)
Last AEX 06/30/21-scheduled 07/05/22. Rx sent on 06/30/21 for #84 w/ 5RF. Last Mammo- 02/19/20  Pt should have plenty Rfs until next appt. Will refuse for now.

## 2022-03-19 ENCOUNTER — Other Ambulatory Visit: Payer: Self-pay | Admitting: Psychiatry

## 2022-03-19 DIAGNOSIS — F5105 Insomnia due to other mental disorder: Secondary | ICD-10-CM

## 2022-03-19 DIAGNOSIS — F4001 Agoraphobia with panic disorder: Secondary | ICD-10-CM

## 2022-03-19 DIAGNOSIS — G2581 Restless legs syndrome: Secondary | ICD-10-CM

## 2022-03-19 DIAGNOSIS — F411 Generalized anxiety disorder: Secondary | ICD-10-CM

## 2022-03-27 ENCOUNTER — Ambulatory Visit (INDEPENDENT_AMBULATORY_CARE_PROVIDER_SITE_OTHER): Payer: 59 | Admitting: Psychiatry

## 2022-03-27 ENCOUNTER — Encounter: Payer: Self-pay | Admitting: Psychiatry

## 2022-03-27 DIAGNOSIS — G2581 Restless legs syndrome: Secondary | ICD-10-CM

## 2022-03-27 DIAGNOSIS — F5105 Insomnia due to other mental disorder: Secondary | ICD-10-CM | POA: Diagnosis not present

## 2022-03-27 DIAGNOSIS — F411 Generalized anxiety disorder: Secondary | ICD-10-CM

## 2022-03-27 DIAGNOSIS — F4001 Agoraphobia with panic disorder: Secondary | ICD-10-CM | POA: Diagnosis not present

## 2022-03-27 NOTE — Progress Notes (Signed)
[name] [id number] 29-Oct-1970 51 y.o.  Subjective:   Patient ID:  [name] is a 51 y.o. (DOB [birth year]) female.  Chief Complaint:  Chief Complaint  Patient presents with   Follow-up   Anxiety    HPI ANAB VIVAR presents to the office today for follow-up of panic disorder, generalized anxiety, and insomnia.  First seen December 02, 2019. Referred by Dr. Deland Pretty.  She was on alprazolam XR 0.5 mg twice daily and citalopram 40 mg daily along with opiates at the time. Buspirone was initiated.  04/21/2020 appointment with the following noted: Covid and hosp for a week 12/26/19.  Got Remdesivir.  Finally recovering.  Still tired and aches. Steroids helped chronic pain from Lyme dz but relapsed off steroids. On opiates [place] still.  Still on Xanax XR 0.5 mg BID, citalopram 40 mg daily.  Off buspirone bc HA and shakey after a week. Still anxious about Covid.  Has to push herself to leave the house.  No full panic but panicky feelings. Still sees therapist Windle Guard. Settled worker's comp case from 10 years ago. Celexa does best of SSRI she's taken but not controlled. Normal QT interval now. Trouble with sleep sometimes. Plan: She prefers rispieridone 0.5 mg HS for a week then 1 tablet nightly if needed.  06/23/2020 appointment with the following noted: Too foggy and slowed down on risperidone 0.5 mg after a week's trial. So stopped it. Went to Post-Covid clinic yesterday with good antibodies.   Shouldn't worry over covid but still does.  Life in limbo after settled worker's comp. Exercising, pray, writing and therapy with [facility]. Can awaken with panic every 2-3 days.  Distracts herself. Awoke at 2AM with panic with fear of not breathing.  Anxiety driving.   Chronic fatigue and sleepy before and worse after post Covid  Plan:  Retry gabapentin  800 mg tablet 1/2 tablet QID up to 1 tablet QID  08/23/2020 appointment with the following noted: Increase gabapentin to  800 mg QID.  Works good for 2 hours and then seems to wear off.   NO SE.  Asks about time release.  Helped with panicky feelings and pain.   Hx RLS Anxiety is improved but not gone as noted.  When it runs out still awakens with panic but able to go back to sleep.  04/14/21 appt noted: Hydroxyzine for IC. Wants gabapentin ER and needs PA to get it.  Has taken regular gabapentin in it's place.  Regular gabapentin will not keep her asleep and awakens about 5 AM feeling panicky after 5-6 hours of sleep.  Gabapentin helps anxiety, pain and RLS but inadequate duration.  Has never taken gabapentin ER but wants to take it for greater duration and to stop withdrawal sx from the short acting gabapentin. Gabapentin helps ropinirole help RLS but it wears off as noted.  Gabapentin helps pain with better sleep until it wears off. "Tremendous" dental work repeatedly with dental problems causes anxiety.  [place] was causing dental problems.  3 more surgeries and then will be done.  Tooth pain and gum problems. Not sig depressed.  06/23/21 appt noted: PA denied for ER gabapentin bc not on formulary. Taking gabapentin 800 QID instead. Still wakens at night with anxiety and thinking of all she needs to do.  Worrying for ahwile.  Avg 5-7 hours of sleep.  Family interferes some too. Can awaken with hear racing and feeling panicky. Not depressed but anxious. Plan: Trial mirtazapine 30  09/01/2021 appointment with the following noted: Never got mirtazapine. 49 M and F with health problems in tandem with sister.  She does the night shifts with mother and then trouble sleeping  after it.  Parents are gettting better but still anxious.  A lot of stress.  Getting a couple of hours nap and then 4-5  hours at night.  Sleep is still irregular.  Some nights EFA.  Still RLS problems several nights per week when first tries to go to sleep about half the nights. No other med changes.   Asked about a girl in the news.    Plan: Gabapentin 800 QID since can't get horizant Continue citalopram 40 Hold mirtazapine 30 Clonidine 0.1 mg start 1/2 tablet at night and gradually increase to 1/2 in the AM and 1 at night for sleep and anxiety and perimenopausal  sweating off label.  12/26/2021 appointment with the following noted: Pending sleep repeat study bc witnessed loud snoring and periods of apnea and excessive daytime drowsiness regardless of sleep quantity. Witnessed in sleep clinic by staff while there with mother in sleep study.  Health issues..  Will talk to Dr. Shelia Media. When increased clonidine to 0.1 mg HA so reduced to 0.05 mg HS Still sweats but not as heavy and BP better with clonidine. Helps care for M with pulm fibrosis and hip fx.  Stress with that.  Worry over mother.    03/27/22 appt noted:  Administrative px around getting sleep study.   Wants LA gabapentin to see if can sleep later and also bc short acting gabapentin causes diarrhea and hopes LA would control pain better. Sees pain doc. Anxiiety is not severe but varies with pain. Chronic pain associated with severe muscle pain assoc with history of Lyme dz and chronic abdominal pain.  Went to [employer] [facility] [employer] for 6 mos. Daytime drowsiness. Has to avoid caffeine bc heart racing. Needs to nap. CC sleepy, pain, and anxity M is sick and caring for her.  Past Psychiatric History: therapist [facility] Dr. Porfirio Mylar. Dr. Koleen Distance [facility] Past Psychiatric Medication Trials:  Zoloft SE HA and itching,  Paxil wt gain 30# and fatigue,  Fluoxetine NR,  Lexapro  not as good as Celexa Cymbalta SE dryness dental problems, no pain benefit Gabapentin NR pain midrange, Horizant Gabatril for sleep 16 mg  Alprazolam 1 mg BID,  Xanax XR Buspirone SE HA  Risperidone SE Trazodone, NR Ambien and Lunesta NR Mirtazapine 30 never taken  Review of Systems:  Review of Systems  Constitutional:  Positive for fatigue.  HENT:  Positive for  dental problem.   Respiratory:  Positive for shortness of breath.   Cardiovascular:  Negative for chest pain and palpitations.  Musculoskeletal:  Positive for myalgias.  Neurological:  Positive for weakness. Negative for dizziness and tremors.       Restless legs  Psychiatric/Behavioral:  Positive for sleep disturbance. The patient is nervous/anxious.     Medications: I have reviewed the patient's current medications.  Current Outpatient Medications  Medication Sig Dispense Refill   albuterol (PROAIR HFA) 108 (90 Base) MCG/ACT inhaler Inhale 2 puffs into the lungs every 6 (six) hours as needed for wheezing or shortness of breath. 18 g 3   ALPRAZolam (XANAX XR) 0.5 MG 24 hr tablet Take 0.5 mg by mouth in the morning and at bedtime.     amLODipine (NORVASC) 2.5 MG tablet Take 1 tablet (2.5 mg total) by mouth daily. NEED OV. 90 tablet 3  baclofen (LIORESAL) 20 MG tablet Take 10 mg by mouth as needed.     beclomethasone (QVAR REDIHALER) 40 MCG/ACT inhaler Inhale 2 puffs into the lungs 2 (two) times daily. (Patient taking differently: Inhale 2 puffs into the lungs 2 (two) times daily as needed.) 10.6 g 5   bisoprolol (ZEBETA) 10 MG tablet Take 10 mg by mouth daily.      buprenorphine (BUTRANS) 15 MCG/HR Place onto the skin once a week.     cetirizine (ZYRTEC) 10 MG tablet Take 10 mg by mouth daily.      Cholecalciferol (VITAMIN D PO) Take 1 tablet by mouth daily.      Cholecalciferol (VITAMIN D3 PO) Take 5,000 Int'l Units by mouth daily.     citalopram (CELEXA) 40 MG tablet Take 40 mg by mouth daily.     cloNIDine (CATAPRES) 0.1 MG tablet TAKE 1/2 TABLET BY MOUTH IN THE MORNING AND 1 TAB AT BEDTIME 45 tablet 0   gabapentin (NEURONTIN) 800 MG tablet Take 1 tablet (800 mg total) by mouth in the morning, at noon, in the evening, and at bedtime. 120 tablet 1   hydrOXYzine (ATARAX/VISTARIL) 25 MG tablet Take 25 mg by mouth as needed for anxiety.      Hyoscyamine Sulfate SL 0.125 MG SUBL Take 0.125  mg by mouth every 4 (four) hours as needed for cramping.     ibuprofen (ADVIL) 200 MG tablet Take by mouth daily as needed.     Melatonin 10 MG TABS Take 10 mg by mouth at bedtime.      Multiple Vitamin (MULTI-VITAMIN) tablet Take by mouth.     norethindrone-ethinyl estradiol-FE (JUNEL FE 1/20) 1-20 MG-MCG tablet Take 1 tablet by mouth daily. Continuous use to control menses and endometriosis. 84 tablet 5   OVER THE COUNTER MEDICATION Floridex-use as directed 10 ml BID     POLY-IRON 150 150 MG capsule Take 300 mg by mouth daily.     Probiotic Product (ALIGN PO) Take 1 capsule by mouth as needed.     prochlorperazine (COMPAZINE) 10 MG tablet Take 10 mg by mouth every 6 (six) hours as needed for nausea or vomiting.      promethazine (PHENERGAN) 25 MG tablet Take 25 mg by mouth every 6 (six) hours as needed for nausea or vomiting.      ranitidine (ZANTAC) 150 MG tablet Take by mouth as needed.     rOPINIRole (REQUIP) 2 MG tablet Take 2 mg by mouth at bedtime.      Spacer/Aero-Holding Chambers (AEROCHAMBER MV) inhaler Use as instructed 1 each 0   thyroid (ARMOUR) 120 MG tablet Take by mouth.     valACYclovir (VALTREX) 1000 MG tablet Take by mouth.     vitamin B-12 (CYANOCOBALAMIN) 500 MCG tablet Take 500 mcg by mouth daily.     No current facility-administered medications for this visit.    Medication Side Effects: None  Allergies:  Allergies  Allergen Reactions   Allegra [Fexofenadine Hcl] Shortness Of Breath and Rash   Fexofenadine Shortness Of Breath, Rash and Other (See Comments)    Shakes, can't sleep, trouble breathing   Oseltamivir Other (See Comments)    Can't breathe, sick to stomach   Tamiflu [Oseltamivir Phosphate] Itching, Nausea And Vomiting and Other (See Comments)    Can't breathe, sick to stomach   Vancomycin Other (See Comments)    Red man's Syndrome.   Diphenhydramine Other (See Comments)    Shakes, can't sleep, trouble breathing   Latex Itching,  Dermatitis and Rash    Sulfamethoxazole Rash   Aspirin Nausea And Vomiting   Doxycycline    Other     NUT ALLERGY   Diphenhydramine Hcl Palpitations and Rash   Morphine Other (See Comments)    REACTION: insomnia   Oseltamivir Phosphate Itching and Nausea And Vomiting   Penicillins Itching and Swelling    Can tolerate Amoxicillin OK.   Sulfonamide Derivatives Rash    Past Medical History:  Diagnosis Date   Anxiety    Arrhythmia    heart races when pt in pain   Asthma    Clostridium difficile infection 2014   Epstein Barr virus infection    Fibromyalgia    GERD (gastroesophageal reflux disease)    History of COVID-19    Hypertension    Hypothyroidism    Immune deficiency disorder ([place])    Lyme disease    Mold contact confirmed    Toxic Mold syndrome   Mononucleosis 5-10   Ovarian cyst    Serous Cystadenofibroma-Left ovary   PONV (postoperative nausea and vomiting)    Shortness of breath    on exertion   Systolic murmur    Thyroid disease     Family History  Problem Relation Age of Onset   Hypertension Mother    Diabetes Mother    Pulmonary embolism Mother        x2   Heart disease Mother    Breast cancer Maternal Aunt        Age 36   Pancreatic cancer Maternal Grandmother    Heart disease Maternal Grandfather    Breast cancer Paternal Grandmother        Age 66   Asthma Paternal Grandmother    Hypertension Father    Thyroid disease Father     Social History   Socioeconomic History   Marital status: Single    Spouse name: Not on file   Number of children: 0   Years of education: college   Highest education level: Not on file  Occupational History   Occupation: Pharmacist, hospital  Tobacco Use   Smoking status: Never   Smokeless tobacco: Never  Vaping Use   Vaping Use: Never used  Substance and Sexual Activity   Alcohol use: No    Alcohol/week: 0.0 standard drinks of alcohol   Drug use: No   Sexual activity: Not Currently    Birth control/protection: OCP  Other Topics  Concern   Not on file  Social History Narrative   Lives at home alone.   Right-handed.   Caffeine use: 1 cup per day.   Social Determinants of Health   Financial Resource Strain: Not on file  Food Insecurity: Not on file  Transportation Needs: Not on file  Physical Activity: Not on file  Stress: Not on file  Social Connections: Not on file  Intimate Partner Violence: Not on file    Past Medical History, Surgical history, Social history, and Family history were reviewed and updated as appropriate.   Please see review of systems for further details on the patient's review from today.   Objective:   Physical Exam:  There were no vitals taken for this visit.  Physical Exam Constitutional:      General: She is not in acute distress.    Appearance: She is obese.  Musculoskeletal:        General: No deformity.  Neurological:     Mental Status: She is alert and oriented to person, place, and time.  Coordination: Coordination normal.  Psychiatric:        Attention and Perception: Attention and perception normal. She does not perceive auditory or visual hallucinations.        Mood and Affect: Mood is anxious. Mood is not depressed. Affect is not labile, blunt, tearful or inappropriate.        Speech: Speech normal. Speech is not slurred.        Behavior: Behavior normal. Behavior is not slowed.        Thought Content: Thought content normal. Thought content is not paranoid or delusional. Thought content does not include homicidal or suicidal ideation. Thought content does not include suicidal plan.        Cognition and Memory: Cognition and memory normal.        Judgment: Judgment normal.     Comments: Insight fair Chronic stress and anxiety ongoing including about meds and mother's dx  anxious questions     Lab Review:     Component Value Date/Time   NA 140 01/23/2020 1202   NA 142 09/09/2013 1102   K 4.2 01/23/2020 1202   K 4.3 09/09/2013 1102   CL 99 01/23/2020  1202   CO2 20 01/23/2020 1202   CO2 27 09/09/2013 1102   GLUCOSE 105 (H) 01/23/2020 1202   GLUCOSE 135 (H) 12/30/2019 0658   GLUCOSE 95 09/09/2013 1102   BUN 12 01/23/2020 1202   BUN 11.0 09/09/2013 1102   CREATININE 0.87 01/23/2020 1202   CREATININE 0.9 09/09/2013 1102   CALCIUM 9.3 01/23/2020 1202   CALCIUM 9.9 09/09/2013 1102   PROT 7.1 01/23/2020 1202   PROT 7.9 09/09/2013 1102   ALBUMIN 4.2 01/23/2020 1202   ALBUMIN 3.5 09/09/2013 1102   AST 24 01/23/2020 1202   AST 19 09/09/2013 1102   ALT 19 01/23/2020 1202   ALT 18 09/09/2013 1102   ALKPHOS 88 01/23/2020 1202   ALKPHOS 80 09/09/2013 1102   BILITOT 0.2 01/23/2020 1202   BILITOT 0.53 09/09/2013 1102   GFRNONAA 80 01/23/2020 1202   GFRAA 92 01/23/2020 1202       Component Value Date/Time   WBC 7.7 01/23/2020 1202   WBC 4.9 12/30/2019 0658   RBC 4.47 01/23/2020 1202   RBC 4.19 12/30/2019 0658   HGB 12.7 01/23/2020 1202   HGB 13.9 09/09/2013 1102   HCT 39.5 01/23/2020 1202   HCT 40.9 09/09/2013 1102   PLT 332 01/23/2020 1202   MCV 88 01/23/2020 1202   MCV 89.1 09/09/2013 1102   MCH 28.4 01/23/2020 1202   MCH 28.2 12/30/2019 0658   MCHC 32.2 01/23/2020 1202   MCHC 31.9 12/30/2019 0658   RDW 13.5 01/23/2020 1202   RDW 13.6 09/09/2013 1102   LYMPHSABS 3.1 01/23/2020 1202   LYMPHSABS 3.2 09/09/2013 1102   MONOABS 0.3 12/30/2019 0658   MONOABS 0.5 09/09/2013 1102   EOSABS 0.4 01/23/2020 1202   BASOSABS 0.1 01/23/2020 1202   BASOSABS 0.1 09/09/2013 1102    No results found for: "POCLITH", "LITHIUM"   No results found for: "PHENYTOIN", "PHENOBARB", "VALPROATE", "CBMZ"   .res Assessment: Plan:    Tamie was seen today for follow-up and anxiety.  Diagnoses and all orders for this visit:  Generalized anxiety disorder  Panic disorder with agoraphobia  Restless legs syndrome  Insomnia due to mental condition  Anxiety is no better than last time with partial response to current meds:  . Intolerant of  buspirone, and risperidone.  [employer]iser [employer] DT multiple med failures.  Patient is also on opiates.  Disc risk with Xanax.  We discussed the short-term risks associated with benzodiazepines including sedation and increased fall risk among others.  Discussed long-term side effect risk including dependence, potential withdrawal symptoms, and the potential eventual dose-related risk of dementia.  But recent studies from 2020 dispute this association between benzodiazepines and dementia risk. Newer studies in 2020 do not support an association with dementia. Continue alprazolam XR 0.5 mg twice daily and citalopram 40 mg daily for now. No plans to increase alprazolam bc pt on opiate.  Consider switch to lorazepam XR   Options risperidone off label, switch citalopram to Viibryd, Luvox, off label Trileptal, venlafaxine, Abilify, clonidine, retry higher dose gabapentin, higher than 40 citalopram but would have to get EKG. EKG 01/14/20 QTc 512 so cannot increase citalopram. Also hesitant to use TCA for the same reason.  Gabapentin 800 QID since can't get horizant Continue citalopram 40  Clonidine 0.1 mg 1/2 tablet twice daily prn sweats and anxiety off label.  Would prefer not to change meds until sleep study  Pending sleep study likely OSA.  She had problems getting it in lab and discussed process of getting it and hwo to manage this.  Need to get this study.  Disc process can take weeks.  Consider modafinil if OSA  FU 3 mos  Lynder Parents, MD, DFAPA   Please see After Visit Summary for patient specific instructions.  Future Appointments  Date Time Provider [place]  07/05/2022  2:30 PM Princess Bruins, MD [facility] None    No orders of the defined types were placed in this encounter.    -------------------------------

## 2022-04-11 ENCOUNTER — Other Ambulatory Visit: Payer: Self-pay | Admitting: Psychiatry

## 2022-04-11 DIAGNOSIS — G2581 Restless legs syndrome: Secondary | ICD-10-CM

## 2022-04-11 DIAGNOSIS — F411 Generalized anxiety disorder: Secondary | ICD-10-CM

## 2022-04-11 DIAGNOSIS — F5105 Insomnia due to other mental disorder: Secondary | ICD-10-CM

## 2022-04-11 DIAGNOSIS — F4001 Agoraphobia with panic disorder: Secondary | ICD-10-CM

## 2022-04-24 ENCOUNTER — Other Ambulatory Visit: Payer: Self-pay | Admitting: Registered Nurse

## 2022-04-24 DIAGNOSIS — I10 Essential (primary) hypertension: Secondary | ICD-10-CM

## 2022-05-03 ENCOUNTER — Ambulatory Visit (INDEPENDENT_AMBULATORY_CARE_PROVIDER_SITE_OTHER): Payer: Commercial Managed Care - HMO | Admitting: Nurse Practitioner

## 2022-05-03 ENCOUNTER — Encounter: Payer: Self-pay | Admitting: Nurse Practitioner

## 2022-05-03 ENCOUNTER — Other Ambulatory Visit: Payer: Self-pay | Admitting: Nurse Practitioner

## 2022-05-03 VITALS — BP 130/80 | HR 83 | Ht 61.0 in | Wt 211.0 lb

## 2022-05-03 DIAGNOSIS — J4520 Mild intermittent asthma, uncomplicated: Secondary | ICD-10-CM

## 2022-05-03 DIAGNOSIS — R0683 Snoring: Secondary | ICD-10-CM

## 2022-05-03 DIAGNOSIS — E6609 Other obesity due to excess calories: Secondary | ICD-10-CM | POA: Diagnosis not present

## 2022-05-03 DIAGNOSIS — Z6839 Body mass index (BMI) 39.0-39.9, adult: Secondary | ICD-10-CM

## 2022-05-03 DIAGNOSIS — G4719 Other hypersomnia: Secondary | ICD-10-CM

## 2022-05-03 MED ORDER — QVAR REDIHALER 40 MCG/ACT IN AERB: 2.0000 | INHALATION_SPRAY | Freq: Two times a day (BID) | RESPIRATORY_TRACT | 4 refills | Status: DC | PRN

## 2022-05-03 NOTE — Patient Instructions (Signed)
Given your symptoms, I am concerned that you may have sleep disordered breathing with obstructive sleep apnea. We will set you up with home sleep study for further evaluation. Someone will contact you for scheduling.   We discussed how untreated sleep apnea puts an individual at risk for cardiac arrhthymias, pulm HTN, DM, stroke and increases their risk for daytime accidents. We also briefly reviewed treatment options including weight loss, side sleeping position, oral appliance, CPAP therapy or referral to ENT for possible surgical options  Follow up in 6-8 weeks with Katie Vanisha Whiten,NP or as needed

## 2022-05-03 NOTE — Progress Notes (Addendum)
$'@Patient'c$  ID: [name], female    DOB: 01/01/1971, 51 y.o.   MRN: [id number]  No chief complaint on file.   Referring provider: Deland Pretty, MD  HPI: 51 year old female, never smoker followed for asthma and COVID-pneumonia April 2021.  She is a patient of Dr. Golden Pop and last seen in office 04/15/2020 by Parrett,NP.  She has since been referred back for sleep consult.  Past medical history significant for GERD, IBS, chronic fatigue, HLD, anxiety, obesity.  TEST/EVENTS:  04/03/2020 PFTs: FEV1 83%, ratio 82, FVC 82%.  No significant BD, mid flow reversibility.  DLCO 89%.  04/15/2022: OV with Parrett NP for 55-monthfollow-up.  Breathing has improved.  Trying to be more active.  Does still have some limitations in activity tolerance.  Previously on Trelegy but unable to tolerate due to recurrent thrush.  Did feel like it helped her breathing.  Pulmonary function testing from April 03, 2020 showed normal lung function with mid flow reversibility.  Recommended starting her on Qvar low-dose ICS with spacer to see if this gives her better control and limits potential side effect such as thrush.  She had COVID-pneumonia in April 2021; clinically improved with clear chest x-ray.  Follow-up 3 months  05/03/2022: Today - sleep consult Patient presents today for sleep consult. She has been struggling with daytime fatigue for a long time now but worse over the past few years. She wakes in the morning feeling like she barely slept. She has been told that she snores and she occasionally wakes up gasping for air. She also has struggled with RLS over the past few years and is on Requip, which does seem to help. She occasionally wakes up in the morning with a headache. She denies sleep parasomnias/paralysis, drowsy driving, cataplexy. She goes to bed around 12 am. Takes a while to fall asleep, 1-3 hours, so she takes melatonin to help with this. Her PCP also put her on gabapentin, which has been helping. She  was 2-3 times a night, sometimes to use the restroom. She officially gets up around 10 am.  She is up about 10 pounds over the last 2 years.  She did have a former sleep study in 2005 which did not show any evidence of sleep apnea from what she was told.  She has a history of hypertension.  She is a never smoker.  Does not drink any alcohol.  She is single and lives part of the time on her own and the other time with her mother and father.  She is currently self-employed.  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  Her father has a history of sleep apnea and was previously on CPAP.  Her mother, which is also a patient of our's, has IPF.  From a respiratory standpoint, her breathing has been stable since she was seen last in 2021.  She did have a respiratory virus in the beginning of 2023 and had to have prednisone but otherwise has not required any antibiotics or prednisone since.  She uses Qvar prior to exercise; otherwise, does not usually have to use it.  Denies any wheezing, chest congestion, cough.  Epworth 8  Allergies  Allergen Reactions   Allegra [Fexofenadine Hcl] Shortness Of Breath and Rash   Fexofenadine Shortness Of Breath, Rash and Other (See Comments)    Shakes, can't sleep, trouble breathing   Oseltamivir Other (See Comments)    Can't breathe, sick to stomach   Tamiflu [  Oseltamivir Phosphate] Itching, Nausea And Vomiting and Other (See Comments)    Can't breathe, sick to stomach   Vancomycin Other (See Comments)    Red man's Syndrome.   Diphenhydramine Other (See Comments)    Shakes, can't sleep, trouble breathing   Latex Itching, Dermatitis and Rash   Sulfamethoxazole Rash   Aspirin Nausea And Vomiting   Doxycycline    Other     NUT ALLERGY   Diphenhydramine Hcl Palpitations and Rash   Morphine Other (See Comments)    REACTION: insomnia   Oseltamivir Phosphate Itching and Nausea And Vomiting   Penicillins Itching and Swelling    Can  tolerate Amoxicillin OK.   Sulfonamide Derivatives Rash    Immunization History  Administered Date(s) Administered   Influenza,inj,Quad PF,6+ Mos 06/16/2019   Influenza-Unspecified 07/02/2012, 06/26/2013, 07/07/2014   Pneumococcal Polysaccharide-23 02/20/2012   Tdap 02/20/2012    Past Medical History:  Diagnosis Date   Anxiety    Arrhythmia    heart races when pt in pain   Asthma    Clostridium difficile infection 2014   Epstein Barr virus infection    Fibromyalgia    GERD (gastroesophageal reflux disease)    History of COVID-19    Hypertension    Hypothyroidism    Immune deficiency disorder ([place])    Lyme disease    Mold contact confirmed    Toxic Mold syndrome   Mononucleosis 5-10   Ovarian cyst    Serous Cystadenofibroma-Left ovary   PONV (postoperative nausea and vomiting)    Shortness of breath    on exertion   Systolic murmur    Thyroid disease     Tobacco History: Social History   Tobacco Use  Smoking Status Never  Smokeless Tobacco Never   Counseling given: Not Answered   Outpatient Medications Prior to Visit  Medication Sig Dispense Refill   albuterol (PROAIR HFA) 108 (90 Base) MCG/ACT inhaler Inhale 2 puffs into the lungs every 6 (six) hours as needed for wheezing or shortness of breath. 18 g 3   ALPRAZolam (XANAX XR) 0.5 MG 24 hr tablet Take 0.5 mg by mouth in the morning and at bedtime.     baclofen (LIORESAL) 20 MG tablet Take 10 mg by mouth as needed.     bisoprolol (ZEBETA) 10 MG tablet Take 10 mg by mouth daily.      buprenorphine (BUTRANS) 15 MCG/HR Place onto the skin once a week.     cetirizine (ZYRTEC) 10 MG tablet Take 10 mg by mouth daily.      Cholecalciferol (VITAMIN D PO) Take 1 tablet by mouth daily.      Cholecalciferol (VITAMIN D3 PO) Take 5,000 Int'l Units by mouth daily.     citalopram (CELEXA) 40 MG tablet Take 40 mg by mouth daily.     cloNIDine (CATAPRES) 0.1 MG tablet TAKE 1/2 TABLET BY MOUTH IN THE MORNING AND 1 TAB AT  BEDTIME 45 tablet 0   gabapentin (NEURONTIN) 800 MG tablet Take 1 tablet (800 mg total) by mouth in the morning, at noon, in the evening, and at bedtime. 120 tablet 1   hydrOXYzine (ATARAX/VISTARIL) 25 MG tablet Take 25 mg by mouth as needed for anxiety.      Hyoscyamine Sulfate SL 0.125 MG SUBL Take 0.125 mg by mouth every 4 (four) hours as needed for cramping.     ibuprofen (ADVIL) 200 MG tablet Take by mouth daily as needed.     Melatonin 10 MG TABS Take 10 mg  by mouth at bedtime.      Multiple Vitamin (MULTI-VITAMIN) tablet Take by mouth.     norethindrone-ethinyl estradiol-FE (JUNEL FE 1/20) 1-20 MG-MCG tablet Take 1 tablet by mouth daily. Continuous use to control menses and endometriosis. 84 tablet 5   prochlorperazine (COMPAZINE) 10 MG tablet Take 10 mg by mouth every 6 (six) hours as needed for nausea or vomiting.      promethazine (PHENERGAN) 25 MG tablet Take 25 mg by mouth every 6 (six) hours as needed for nausea or vomiting.      rOPINIRole (REQUIP) 2 MG tablet Take 2 mg by mouth at bedtime.      Spacer/Aero-Holding Chambers (AEROCHAMBER MV) inhaler Use as instructed 1 each 0   thyroid (ARMOUR) 120 MG tablet Take by mouth.     vitamin B-12 (CYANOCOBALAMIN) 500 MCG tablet Take 500 mcg by mouth daily.     beclomethasone (QVAR REDIHALER) 40 MCG/ACT inhaler Inhale 2 puffs into the lungs 2 (two) times daily. (Patient taking differently: Inhale 2 puffs into the lungs 2 (two) times daily as needed.) 10.6 g 5   amLODipine (NORVASC) 2.5 MG tablet Take 1 tablet (2.5 mg total) by mouth daily. NEED OV. (Patient not taking: Reported on 05/03/2022) 90 tablet 3   OVER THE COUNTER MEDICATION Floridex-use as directed 10 ml BID (Patient not taking: Reported on 05/03/2022)     POLY-IRON 150 150 MG capsule Take 300 mg by mouth daily. (Patient not taking: Reported on 05/03/2022)     Probiotic Product (ALIGN PO) Take 1 capsule by mouth as needed. (Patient not taking: Reported on 05/03/2022)     ranitidine  (ZANTAC) 150 MG tablet Take by mouth as needed. (Patient not taking: Reported on 05/03/2022)     valACYclovir (VALTREX) 1000 MG tablet Take by mouth. (Patient not taking: Reported on 05/03/2022)     No facility-administered medications prior to visit.     Review of Systems:   Constitutional: No night sweats, fevers, chills, or lassitude. +excessive daytime fatigue; weight gain (10lb in 2 yr) HEENT: No headaches, difficulty swallowing, tooth/dental problems, or sore throat. No sneezing, itching, ear ache, nasal congestion, or post nasal drip CV:  +PND. No chest pain, orthopnea, swelling in lower extremities, anasarca, dizziness, palpitations, syncope Resp: +snoring; shortness of breath with exertion (baseline; strenuous activity). No excess mucus or change in color of mucus. No productive or non-productive. No hemoptysis. No wheezing.  No chest wall deformity GI:  No heartburn, indigestion, abdominal pain, nausea, vomiting, diarrhea, change in bowel habits, loss of appetite, bloody stools.  GU: No dysuria, change in color of urine, urgency or frequency.  No flank pain, no hematuria  MSK:  No joint pain or swelling.  No decreased range of motion.  No back pain. Neuro: No dizziness or lightheadedness.  Psych: No depression or anxiety. Mood stable.     Physical Exam:  BP 130/80 (BP Location: Left Arm, Cuff Size: Normal)   Pulse 83   Ht '5\' 1"'$  (1.549 m)   Wt 211 lb (95.7 kg)   SpO2 97%   BMI 39.87 kg/m   GEN: Pleasant, interactive, well-appearing; obese; in no acute distress. HEENT:  Normocephalic and atraumatic. PERRLA. Sclera white. Nasal turbinates pink, moist and patent bilaterally. No rhinorrhea present. Oropharynx pink and moist, without exudate or edema. No lesions, ulcerations, or postnasal drip. Mallampati II NECK:  Supple w/ fair ROM. No JVD present. Normal carotid impulses w/o bruits. Thyroid symmetrical with no goiter or nodules palpated. No lymphadenopathy.  CV: RRR, no m/r/g,  no peripheral edema. Pulses intact, +2 bilaterally. No cyanosis, pallor or clubbing. PULMONARY:  Unlabored, regular breathing. Clear bilaterally A&P w/o wheezes/rales/rhonchi. No accessory muscle use. No dullness to percussion. GI: BS present and normoactive. Soft, non-tender to palpation.  MSK: No erythema, warmth or tenderness.  Neuro: A/Ox3. No focal deficits noted.   Skin: Warm, no lesions or rashe Psych: Normal affect and behavior. Judgement and thought content appropriate.     Lab Results:  CBC    Component Value Date/Time   WBC 7.7 01/23/2020 1202   WBC 4.9 12/30/2019 0658   RBC 4.47 01/23/2020 1202   RBC 4.19 12/30/2019 0658   HGB 12.7 01/23/2020 1202   HGB 13.9 09/09/2013 1102   HCT 39.5 01/23/2020 1202   HCT 40.9 09/09/2013 1102   PLT 332 01/23/2020 1202   MCV 88 01/23/2020 1202   MCV 89.1 09/09/2013 1102   MCH 28.4 01/23/2020 1202   MCH 28.2 12/30/2019 0658   MCHC 32.2 01/23/2020 1202   MCHC 31.9 12/30/2019 0658   RDW 13.5 01/23/2020 1202   RDW 13.6 09/09/2013 1102   LYMPHSABS 3.1 01/23/2020 1202   LYMPHSABS 3.2 09/09/2013 1102   MONOABS 0.3 12/30/2019 0658   MONOABS 0.5 09/09/2013 1102   EOSABS 0.4 01/23/2020 1202   BASOSABS 0.1 01/23/2020 1202   BASOSABS 0.1 09/09/2013 1102    BMET    Component Value Date/Time   NA 140 01/23/2020 1202   NA 142 09/09/2013 1102   K 4.2 01/23/2020 1202   K 4.3 09/09/2013 1102   CL 99 01/23/2020 1202   CO2 20 01/23/2020 1202   CO2 27 09/09/2013 1102   GLUCOSE 105 (H) 01/23/2020 1202   GLUCOSE 135 (H) 12/30/2019 0658   GLUCOSE 95 09/09/2013 1102   BUN 12 01/23/2020 1202   BUN 11.0 09/09/2013 1102   CREATININE 0.87 01/23/2020 1202   CREATININE 0.9 09/09/2013 1102   CALCIUM 9.3 01/23/2020 1202   CALCIUM 9.9 09/09/2013 1102   GFRNONAA 80 01/23/2020 1202   GFRAA 92 01/23/2020 1202    BNP No results found for: "BNP"   Imaging:  No results found.       Latest Ref Rng & Units 04/02/2020   12:14 PM  PFT  Results  FVC-Pre L 2.59   FVC-Predicted Pre % 80   FVC-Post L 2.65   FVC-Predicted Post % 82   Pre FEV1/FVC % % 82   Post FEV1/FCV % % 82   FEV1-Pre L 2.12   FEV1-Predicted Pre % 81   FEV1-Post L 2.17   DLCO uncorrected ml/min/mmHg 17.26   DLCO UNC% % 89   DLCO corrected ml/min/mmHg 17.26   DLCO COR %Predicted % 89   DLVA Predicted % 97   TLC L 3.90   TLC % Predicted % 84   RV % Predicted % 85     No results found for: "NITRICOXIDE"      Assessment & Plan:   Excessive daytime sleepiness She has snoring, excessive daytime sleepiness, nocturnal apneic events, morning headaches, RLS. BMI 39. Epworth 8. History of HTN. Given this,  I am concerned she could have sleep disordered breathing with obstructive sleep apnea. She will need sleep study for further evaluation.    - discussed how weight can impact sleep and risk for sleep disordered breathing - discussed options to assist with weight loss: combination of diet modification, cardiovascular and strength training exercises   - had an extensive discussion regarding the adverse health  consequences related to untreated sleep disordered breathing - specifically discussed the risks for hypertension, coronary artery disease, cardiac dysrhythmias, cerebrovascular disease, and diabetes - lifestyle modification discussed   - discussed how sleep disruption can increase risk of accidents, particularly when driving - safe driving practices were discussed  Patient Instructions  Given your symptoms, I am concerned that you may have sleep disordered breathing with obstructive sleep apnea. We will set you up with home sleep study for further evaluation. Someone will contact you for scheduling.   We discussed how untreated sleep apnea puts an individual at risk for cardiac arrhthymias, pulm HTN, DM, stroke and increases their risk for daytime accidents. We also briefly reviewed treatment options including weight loss, side sleeping position,  oral appliance, CPAP therapy or referral to ENT for possible surgical options  Follow up in 6-8 weeks with Katie Kristyl Athens,NP or as needed     Asthma Compensated on current regimen; using ICS PRN and prior to exercise. Refilled today.   Obesity Healthy weight loss encouraged  I spent 45 minutes of dedicated to the care of this patient on the date of this encounter to include pre-visit review of records, face-to-face time with the patient discussing conditions above, post visit ordering of testing, clinical documentation with the electronic health record, making appropriate referrals as documented, and communicating necessary findings to members of the patients care team.  Clayton Bibles, NP 05/03/2022  Pt aware and understands NP's role.

## 2022-05-03 NOTE — Progress Notes (Signed)
Reviewed and agree with assessment/plan.   Chesley Mires, MD [facility] Pulmonary/Critical Care 05/03/2022, 5:18 PM Pager:  [phone]

## 2022-05-03 NOTE — Assessment & Plan Note (Signed)
Compensated on current regimen; using ICS PRN and prior to exercise. Refilled today.

## 2022-05-03 NOTE — Assessment & Plan Note (Signed)
She has snoring, excessive daytime sleepiness, nocturnal apneic events, morning headaches, RLS. BMI 39. Epworth 8. History of HTN. Given this,  I am concerned she could have sleep disordered breathing with obstructive sleep apnea. She will need sleep study for further evaluation.    - discussed how weight can impact sleep and risk for sleep disordered breathing - discussed options to assist with weight loss: combination of diet modification, cardiovascular and strength training exercises   - had an extensive discussion regarding the adverse health consequences related to untreated sleep disordered breathing - specifically discussed the risks for hypertension, coronary artery disease, cardiac dysrhythmias, cerebrovascular disease, and diabetes - lifestyle modification discussed   - discussed how sleep disruption can increase risk of accidents, particularly when driving - safe driving practices were discussed  Patient Instructions  Given your symptoms, I am concerned that you may have sleep disordered breathing with obstructive sleep apnea. We will set you up with home sleep study for further evaluation. Someone will contact you for scheduling.   We discussed how untreated sleep apnea puts an individual at risk for cardiac arrhthymias, pulm HTN, DM, stroke and increases their risk for daytime accidents. We also briefly reviewed treatment options including weight loss, side sleeping position, oral appliance, CPAP therapy or referral to ENT for possible surgical options  Follow up in 6-8 weeks with Katie Roney Youtz,NP or as needed

## 2022-05-03 NOTE — Assessment & Plan Note (Signed)
Healthy weight loss encouraged 

## 2022-05-04 ENCOUNTER — Ambulatory Visit
Admission: RE | Admit: 2022-05-04 | Discharge: 2022-05-04 | Disposition: A | Discharge status: Home / Self Care | Payer: No Typology Code available for payment source | Source: Ambulatory Visit | Attending: Registered Nurse | Admitting: Registered Nurse

## 2022-05-04 DIAGNOSIS — I10 Essential (primary) hypertension: Secondary | ICD-10-CM

## 2022-05-05 ENCOUNTER — Other Ambulatory Visit: Payer: Self-pay | Admitting: Psychiatry

## 2022-05-05 DIAGNOSIS — F4001 Agoraphobia with panic disorder: Secondary | ICD-10-CM

## 2022-05-05 DIAGNOSIS — G2581 Restless legs syndrome: Secondary | ICD-10-CM

## 2022-05-05 DIAGNOSIS — F411 Generalized anxiety disorder: Secondary | ICD-10-CM

## 2022-05-05 DIAGNOSIS — F5105 Insomnia due to other mental disorder: Secondary | ICD-10-CM

## 2022-06-03 ENCOUNTER — Other Ambulatory Visit: Payer: Self-pay | Admitting: Psychiatry

## 2022-06-03 DIAGNOSIS — F4001 Agoraphobia with panic disorder: Secondary | ICD-10-CM

## 2022-06-03 DIAGNOSIS — F411 Generalized anxiety disorder: Secondary | ICD-10-CM

## 2022-06-03 DIAGNOSIS — F5105 Insomnia due to other mental disorder: Secondary | ICD-10-CM

## 2022-06-03 DIAGNOSIS — G2581 Restless legs syndrome: Secondary | ICD-10-CM

## 2022-06-14 ENCOUNTER — Ambulatory Visit: Payer: Commercial Managed Care - HMO | Admitting: Nurse Practitioner

## 2022-06-14 ENCOUNTER — Ambulatory Visit (INDEPENDENT_AMBULATORY_CARE_PROVIDER_SITE_OTHER): Payer: Commercial Managed Care - HMO | Admitting: Psychiatry

## 2022-06-14 ENCOUNTER — Encounter: Payer: Self-pay | Admitting: Psychiatry

## 2022-06-14 DIAGNOSIS — F411 Generalized anxiety disorder: Secondary | ICD-10-CM

## 2022-06-14 DIAGNOSIS — F4001 Agoraphobia with panic disorder: Secondary | ICD-10-CM | POA: Diagnosis not present

## 2022-06-14 DIAGNOSIS — F5105 Insomnia due to other mental disorder: Secondary | ICD-10-CM

## 2022-06-14 DIAGNOSIS — G2581 Restless legs syndrome: Secondary | ICD-10-CM | POA: Diagnosis not present

## 2022-06-14 MED ORDER — GABAPENTIN 800 MG PO TABS: 800.0000 mg | ORAL_TABLET | Freq: Four times a day (QID) | ORAL | 3 refills | Status: DC

## 2022-06-14 NOTE — Patient Instructions (Signed)
Consider modafinil for alertness

## 2022-06-14 NOTE — Progress Notes (Signed)
[name] [id number] 1971-07-26 52 y.o.  Subjective:   Patient ID:  [name] is a 51 y.o. (DOB Sep 25, 1970) female.  Chief Complaint:  Chief Complaint  Patient presents with   Follow-up   Anxiety    HPI [name] presents to the office today for follow-up of panic disorder, generalized anxiety, and insomnia.  First seen December 02, 2019. Referred by Dr. Deland Pretty.  She was on alprazolam XR 0.5 mg twice daily and citalopram 40 mg daily along with opiates at the time. Buspirone was initiated.  04/21/2020 appointment with the following noted: Covid and hosp for a week 12/26/19.  Got Remdesivir.  Finally recovering.  Still tired and aches. Steroids helped chronic pain from Lyme dz but relapsed off steroids. On opiates [place] still.  Still on Xanax XR 0.5 mg BID, citalopram 40 mg daily.  Off buspirone bc HA and shakey after a week. Still anxious about Covid.  Has to push herself to leave the house.  No full panic but panicky feelings. Still sees therapist Windle Guard. Settled worker's comp case from 10 years ago. Celexa does best of SSRI she's taken but not controlled. Normal QT interval now. Trouble with sleep sometimes. Plan: She prefers rispieridone 0.5 mg HS for a week then 1 tablet nightly if needed.  06/23/2020 appointment with the following noted: Too foggy and slowed down on risperidone 0.5 mg after a week's trial. So stopped it. Went to Post-Covid clinic yesterday with good antibodies.   Shouldn't worry over covid but still does.  Life in limbo after settled worker's comp. Exercising, pray, writing and therapy with [facility]. Can awaken with panic every 2-3 days.  Distracts herself. Awoke at 2AM with panic with fear of not breathing.  Anxiety driving.   Chronic fatigue and sleepy before and worse after post Covid  Plan:  Retry gabapentin  800 mg tablet 1/2 tablet QID up to 1 tablet QID  08/23/2020 appointment with the following noted: Increase gabapentin to  800 mg QID.  Works good for 2 hours and then seems to wear off.   NO SE.  Asks about time release.  Helped with panicky feelings and pain.   Hx RLS Anxiety is improved but not gone as noted.  When it runs out still awakens with panic but able to go back to sleep.  04/14/21 appt noted: Hydroxyzine for IC. Wants gabapentin ER and needs PA to get it.  Has taken regular gabapentin in it's place.  Regular gabapentin will not keep her asleep and awakens about 5 AM feeling panicky after 5-6 hours of sleep.  Gabapentin helps anxiety, pain and RLS but inadequate duration.  Has never taken gabapentin ER but wants to take it for greater duration and to stop withdrawal sx from the short acting gabapentin. Gabapentin helps ropinirole help RLS but it wears off as noted.  Gabapentin helps pain with better sleep until it wears off. "Tremendous" dental work repeatedly with dental problems causes anxiety.  [place] was causing dental problems.  3 more surgeries and then will be done.  Tooth pain and gum problems. Not sig depressed.  06/23/21 appt noted: PA denied for ER gabapentin bc not on formulary. Taking gabapentin 800 QID instead. Still wakens at night with anxiety and thinking of all she needs to do.  Worrying for ahwile.  Avg 5-7 hours of sleep.  Family interferes some too. Can awaken with hear racing and feeling panicky. Not depressed but anxious. Plan: Trial mirtazapine 30  09/01/2021 appointment with the following noted: Never got mirtazapine. 37 M and F with health problems in tandem with sister.  She does the night shifts with mother and then trouble sleeping  after it.  Parents are gettting better but still anxious.  A lot of stress.  Getting a couple of hours nap and then 4-5  hours at night.  Sleep is still irregular.  Some nights EFA.  Still RLS problems several nights per week when first tries to go to sleep about half the nights. No other med changes.   Asked about a girl in the news.    Plan: Gabapentin 800 QID since can't get horizant Continue citalopram 40 Hold mirtazapine 30 Clonidine 0.1 mg start 1/2 tablet at night and gradually increase to 1/2 in the AM and 1 at night for sleep and anxiety and perimenopausal  sweating off label.  12/26/2021 appointment with the following noted: Pending sleep repeat study bc witnessed loud snoring and periods of apnea and excessive daytime drowsiness regardless of sleep quantity. Witnessed in sleep clinic by staff while there with mother in sleep study.  Health issues..  Will talk to Dr. Shelia Media. When increased clonidine to 0.1 mg HA so reduced to 0.05 mg HS Still sweats but not as heavy and BP better with clonidine. Helps care for M with pulm fibrosis and hip fx.  Stress with that.  Worry over mother.    03/27/22 appt noted:  Administrative px around getting sleep study.   Wants LA gabapentin to see if can sleep later and also bc short acting gabapentin causes diarrhea and hopes LA would control pain better. Sees pain doc. Anxiiety is not severe but varies with pain. Chronic pain associated with severe muscle pain assoc with history of Lyme dz and chronic abdominal pain.  Went to [employer] [facility] [employer] for 6 mos. Daytime drowsiness. Has to avoid caffeine bc heart racing. Needs to nap. CC sleepy, pain, and anxity M is sick and caring for her.  06/14/22 appt noted: Still waiting for sleep study. Still taking clonidine 0.05 mg BID helps anxiety and BP. Continues citalopram, gabapentin 800 mg TID, hydroxyzine for IC, Xanax XR 0.5 mg BID Still on opiate, [place] Still sleepy and tired ongoing and predates current meds.  Doesn't feel rested.  Had these sx for years with remote negative sleep study. Had shingles in face and ear.  Still a little pain on side of head. d  Past Psychiatric History: therapist [facility] Dr. Porfirio Mylar. Dr. Koleen Distance [facility] [place] Past Psychiatric Medication Trials:  Zoloft SE HA and itching,  Paxil  wt gain 30# and fatigue,  Fluoxetine NR,  Lexapro  not as good as Celexa Cymbalta SE dryness dental problems, no pain benefit Gabapentin NR pain midrange, Horizant Gabatril for sleep 16 mg  Alprazolam 1 mg BID,  Xanax XR Buspirone SE HA  Risperidone SE Trazodone, NR Ambien and Lunesta NR Mirtazapine 30 never taken  Review of Systems:  Review of Systems  Constitutional:  Positive for fatigue.  HENT:  Positive for dental problem.   Respiratory:  Positive for shortness of breath.   Cardiovascular:  Negative for chest pain and palpitations.  Musculoskeletal:  Positive for myalgias.  Neurological:  Positive for weakness. Negative for dizziness and tremors.       Restless legs  Psychiatric/Behavioral:  Positive for sleep disturbance. The patient is nervous/anxious.     Medications: I have reviewed the patient's current medications.  Current Outpatient Medications  Medication Sig  Dispense Refill   albuterol (PROAIR HFA) 108 (90 Base) MCG/ACT inhaler Inhale 2 puffs into the lungs every 6 (six) hours as needed for wheezing or shortness of breath. 18 g 3   ALPRAZolam (XANAX XR) 0.5 MG 24 hr tablet Take 0.5 mg by mouth in the morning and at bedtime. BID     baclofen (LIORESAL) 20 MG tablet Take 10 mg by mouth as needed.     bisoprolol (ZEBETA) 10 MG tablet Take 10 mg by mouth daily.      buprenorphine (BUTRANS) 15 MCG/HR Place onto the skin once a week.     cetirizine (ZYRTEC) 10 MG tablet Take 10 mg by mouth daily.      Cholecalciferol (VITAMIN D PO) Take 1 tablet by mouth daily.      Cholecalciferol (VITAMIN D3 PO) Take 5,000 Int'l Units by mouth daily.     citalopram (CELEXA) 40 MG tablet Take 40 mg by mouth daily.     cloNIDine (CATAPRES) 0.1 MG tablet TAKE 1/2 TABLET BY MOUTH IN THE MORNING AND 1 TAB AT BEDTIME (Patient taking differently: TAKE 1/2 TABLET BY MOUTH IN THE MORNING AND 1/2 TAB AT BEDTIME) 45 tablet 0   gabapentin (NEURONTIN) 800 MG tablet Take 1 tablet (800 mg total)  by mouth in the morning, at noon, in the evening, and at bedtime. 120 tablet 1   hydrOXYzine (ATARAX/VISTARIL) 25 MG tablet Take 25 mg by mouth as needed for anxiety.      Hyoscyamine Sulfate SL 0.125 MG SUBL Take 0.125 mg by mouth every 4 (four) hours as needed for cramping.     ibuprofen (ADVIL) 200 MG tablet Take by mouth daily as needed.     Melatonin 10 MG TABS Take 10 mg by mouth at bedtime.      Mometasone Furoate (ASMANEX HFA) 50 MCG/ACT AERO Inhale 2 puffs into the lungs 2 (two) times daily as needed (shortness of breath or wheezing). 13 g 3   Multiple Vitamin (MULTI-VITAMIN) tablet Take by mouth.     norethindrone-ethinyl estradiol-FE (JUNEL FE 1/20) 1-20 MG-MCG tablet Take 1 tablet by mouth daily. Continuous use to control menses and endometriosis. 84 tablet 5   POLY-IRON 150 150 MG capsule Take 300 mg by mouth daily.     Probiotic Product (ALIGN PO) Take 1 capsule by mouth as needed.     prochlorperazine (COMPAZINE) 10 MG tablet Take 10 mg by mouth every 6 (six) hours as needed for nausea or vomiting.      promethazine (PHENERGAN) 25 MG tablet Take 25 mg by mouth every 6 (six) hours as needed for nausea or vomiting.      rOPINIRole (REQUIP) 2 MG tablet Take 2 mg by mouth at bedtime.      Spacer/Aero-Holding Chambers (AEROCHAMBER MV) inhaler Use as instructed 1 each 0   thyroid (ARMOUR) 120 MG tablet Take by mouth.     valACYclovir (VALTREX) 1000 MG tablet Take by mouth.     vitamin B-12 (CYANOCOBALAMIN) 500 MCG tablet Take 500 mcg by mouth daily.     ranitidine (ZANTAC) 150 MG tablet Take by mouth as needed. (Patient not taking: Reported on 05/03/2022)     No current facility-administered medications for this visit.    Medication Side Effects: None  Allergies:  Allergies  Allergen Reactions   Allegra [Fexofenadine Hcl] Shortness Of Breath and Rash   Fexofenadine Shortness Of Breath, Rash and Other (See Comments)    Shakes, can't sleep, trouble breathing   Oseltamivir Other  (  See Comments)    Can't breathe, sick to stomach   Tamiflu [Oseltamivir Phosphate] Itching, Nausea And Vomiting and Other (See Comments)    Can't breathe, sick to stomach   Vancomycin Other (See Comments)    Red man's Syndrome.   Diphenhydramine Other (See Comments)    Shakes, can't sleep, trouble breathing   Latex Itching, Dermatitis and Rash   Sulfamethoxazole Rash   Aspirin Nausea And Vomiting   Doxycycline    Other     NUT ALLERGY   Diphenhydramine Hcl Palpitations and Rash   Morphine Other (See Comments)    REACTION: insomnia   Oseltamivir Phosphate Itching and Nausea And Vomiting   Penicillins Itching and Swelling    Can tolerate Amoxicillin OK.   Sulfonamide Derivatives Rash    Past Medical History:  Diagnosis Date   Anxiety    Arrhythmia    heart races when pt in pain   Asthma    Clostridium difficile infection 2014   Epstein Barr virus infection    Fibromyalgia    GERD (gastroesophageal reflux disease)    History of COVID-19    Hypertension    Hypothyroidism    Immune deficiency disorder ([place])    Lyme disease    Mold contact confirmed    Toxic Mold syndrome   Mononucleosis 5-10   Ovarian cyst    Serous Cystadenofibroma-Left ovary   PONV (postoperative nausea and vomiting)    Shortness of breath    on exertion   Systolic murmur    Thyroid disease     Family History  Problem Relation Age of Onset   Hypertension Mother    Diabetes Mother    Pulmonary embolism Mother        x2   Heart disease Mother    Breast cancer Maternal Aunt        Age 46   Pancreatic cancer Maternal Grandmother    Heart disease Maternal Grandfather    Breast cancer Paternal Grandmother        Age 62   Asthma Paternal Grandmother    Hypertension Father    Thyroid disease Father     Social History   Socioeconomic History   Marital status: Single    Spouse name: Not on file   Number of children: 0   Years of education: college   Highest education level: Not on file   Occupational History   Occupation: Pharmacist, hospital  Tobacco Use   Smoking status: Never   Smokeless tobacco: Never  Vaping Use   Vaping Use: Never used  Substance and Sexual Activity   Alcohol use: No    Alcohol/week: 0.0 standard drinks of alcohol   Drug use: No   Sexual activity: Not Currently    Birth control/protection: OCP  Other Topics Concern   Not on file  Social History Narrative   Lives at home alone.   Right-handed.   Caffeine use: 1 cup per day.   Social Determinants of Health   Financial Resource Strain: Not on file  Food Insecurity: Not on file  Transportation Needs: Not on file  Physical Activity: Not on file  Stress: Not on file  Social Connections: Not on file  Intimate Partner Violence: Not on file    Past Medical History, Surgical history, Social history, and Family history were reviewed and updated as appropriate.   Please see review of systems for further details on the patient's review from today.   Objective:   Physical Exam:  There were no vitals  taken for this visit.  Physical Exam Constitutional:      General: She is not in acute distress.    Appearance: She is obese.  Musculoskeletal:        General: No deformity.  Neurological:     Mental Status: She is alert and oriented to person, place, and time.     Coordination: Coordination normal.  Psychiatric:        Attention and Perception: Attention and perception normal. She does not perceive auditory or visual hallucinations.        Mood and Affect: Mood is anxious. Mood is not depressed. Affect is not labile, blunt, tearful or inappropriate.        Speech: Speech normal. Speech is not slurred.        Behavior: Behavior normal. Behavior is not slowed.        Thought Content: Thought content normal. Thought content is not paranoid or delusional. Thought content does not include homicidal or suicidal ideation. Thought content does not include suicidal plan.        Cognition and Memory: Cognition  and memory normal.        Judgment: Judgment normal.     Comments: Insight fair Chronic stress and anxiety ongoing including about meds and mother's dx  anxious questions     Lab Review:     Component Value Date/Time   NA 140 01/23/2020 1202   NA 142 09/09/2013 1102   K 4.2 01/23/2020 1202   K 4.3 09/09/2013 1102   CL 99 01/23/2020 1202   CO2 20 01/23/2020 1202   CO2 27 09/09/2013 1102   GLUCOSE 105 (H) 01/23/2020 1202   GLUCOSE 135 (H) 12/30/2019 0658   GLUCOSE 95 09/09/2013 1102   BUN 12 01/23/2020 1202   BUN 11.0 09/09/2013 1102   CREATININE 0.87 01/23/2020 1202   CREATININE 0.9 09/09/2013 1102   CALCIUM 9.3 01/23/2020 1202   CALCIUM 9.9 09/09/2013 1102   PROT 7.1 01/23/2020 1202   PROT 7.9 09/09/2013 1102   ALBUMIN 4.2 01/23/2020 1202   ALBUMIN 3.5 09/09/2013 1102   AST 24 01/23/2020 1202   AST 19 09/09/2013 1102   ALT 19 01/23/2020 1202   ALT 18 09/09/2013 1102   ALKPHOS 88 01/23/2020 1202   ALKPHOS 80 09/09/2013 1102   BILITOT 0.2 01/23/2020 1202   BILITOT 0.53 09/09/2013 1102   GFRNONAA 80 01/23/2020 1202   GFRAA 92 01/23/2020 1202       Component Value Date/Time   WBC 7.7 01/23/2020 1202   WBC 4.9 12/30/2019 0658   RBC 4.47 01/23/2020 1202   RBC 4.19 12/30/2019 0658   HGB 12.7 01/23/2020 1202   HGB 13.9 09/09/2013 1102   HCT 39.5 01/23/2020 1202   HCT 40.9 09/09/2013 1102   PLT 332 01/23/2020 1202   MCV 88 01/23/2020 1202   MCV 89.1 09/09/2013 1102   MCH 28.4 01/23/2020 1202   MCH 28.2 12/30/2019 0658   MCHC 32.2 01/23/2020 1202   MCHC 31.9 12/30/2019 0658   RDW 13.5 01/23/2020 1202   RDW 13.6 09/09/2013 1102   LYMPHSABS 3.1 01/23/2020 1202   LYMPHSABS 3.2 09/09/2013 1102   MONOABS 0.3 12/30/2019 0658   MONOABS 0.5 09/09/2013 1102   EOSABS 0.4 01/23/2020 1202   BASOSABS 0.1 01/23/2020 1202   BASOSABS 0.1 09/09/2013 1102    No results found for: "POCLITH", "LITHIUM"   No results found for: "PHENYTOIN", "PHENOBARB", "VALPROATE", "CBMZ"    .res Assessment: Plan:    [name] was  seen today for follow-up and anxiety.  Diagnoses and all orders for this visit:  Panic disorder with agoraphobia  Generalized anxiety disorder  Restless legs syndrome  Insomnia due to mental condition  Anxiety is no better than last time with partial response to current meds:  . Intolerant of buspirone, and risperidone.  [employer]iser [employer] DT multiple med failures.  Patient is also on opiates.  Disc risk with Xanax.  We discussed the short-term risks associated with benzodiazepines including sedation and increased fall risk among others.  Discussed long-term side effect risk including dependence, potential withdrawal symptoms, and the potential eventual dose-related risk of dementia.  But recent studies from 2020 dispute this association between benzodiazepines and dementia risk. Newer studies in 2020 do not support an association with dementia. Continue alprazolam XR 0.5 mg twice daily and citalopram 40 mg daily for now. No plans to increase alprazolam bc pt on opiate.  Consider switch to lorazepam XR   Options risperidone off label, switch citalopram to Viibryd, Luvox, off label Trileptal, venlafaxine, Abilify, clonidine, retry higher dose gabapentin, higher than 40 citalopram but would have to get EKG. EKG 01/14/20 QTc 512 so cannot increase citalopram. Also hesitant to use TCA for the same reason.  Gabapentin 800 QID since can't get horizant Continue citalopram 40  Clonidine 0.1 mg 1/2 tablet twice daily prn sweats and anxiety off label.  Would prefer not to change meds until sleep study  Pending sleep study likely OSA.  She had problems getting it in lab and discussed process of getting it and hwo to manage this.  Need to get this study.  Disc process can take weeks.  Consider modafinil if OSA  FU 3 mos  Lynder Parents, MD, DFAPA   Please see After Visit Summary for patient specific instructions.  Future Appointments  Date Time  Provider [place]  07/05/2022  2:30 PM Princess Bruins, MD [facility] None  07/05/2022  3:00 PM Cobb, Karie Schwalbe, NP [facility] None    No orders of the defined types were placed in this encounter.    -------------------------------

## 2022-06-15 ENCOUNTER — Telehealth: Payer: Self-pay | Admitting: Nurse Practitioner

## 2022-06-15 NOTE — Telephone Encounter (Signed)
Called and spoke with patient. She wanted to check on the status of her home sleep test. She was seen on the same day as her father back in August and he has already been scheduled to receive his HST equipment on 10/24. She wanted to know if she could pick up her equipment on the same day to prevent having to make separate trips. Her father does not drive and she would need to bring him.   Confirmed the order was placed. PCCs, can you please advise? Thanks!

## 2022-06-15 NOTE — Telephone Encounter (Signed)
Patient called to get an update on her cpap machine.  She stated that her father got a call 2 days ago to pick up his and they came at the same time and wanted to know why she did not get a call.  Please advise and call patient to discuss at [phone] or [phone].

## 2022-06-29 ENCOUNTER — Ambulatory Visit: Payer: Commercial Managed Care - HMO

## 2022-06-29 DIAGNOSIS — G4733 Obstructive sleep apnea (adult) (pediatric): Secondary | ICD-10-CM

## 2022-06-29 DIAGNOSIS — G4719 Other hypersomnia: Secondary | ICD-10-CM

## 2022-07-01 ENCOUNTER — Other Ambulatory Visit: Payer: Self-pay | Admitting: Psychiatry

## 2022-07-01 DIAGNOSIS — G2581 Restless legs syndrome: Secondary | ICD-10-CM

## 2022-07-01 DIAGNOSIS — F4001 Agoraphobia with panic disorder: Secondary | ICD-10-CM

## 2022-07-01 DIAGNOSIS — F411 Generalized anxiety disorder: Secondary | ICD-10-CM

## 2022-07-01 DIAGNOSIS — F5105 Insomnia due to other mental disorder: Secondary | ICD-10-CM

## 2022-07-03 ENCOUNTER — Encounter (INDEPENDENT_AMBULATORY_CARE_PROVIDER_SITE_OTHER): Payer: Self-pay

## 2022-07-05 ENCOUNTER — Ambulatory Visit: Payer: BC Managed Care – PPO | Admitting: Obstetrics & Gynecology

## 2022-07-05 ENCOUNTER — Ambulatory Visit: Payer: Commercial Managed Care - HMO | Admitting: Nurse Practitioner

## 2022-07-10 DIAGNOSIS — G4733 Obstructive sleep apnea (adult) (pediatric): Secondary | ICD-10-CM | POA: Diagnosis not present

## 2022-07-15 ENCOUNTER — Other Ambulatory Visit: Payer: Self-pay | Admitting: Obstetrics & Gynecology

## 2022-07-15 DIAGNOSIS — Z3041 Encounter for surveillance of contraceptive pills: Secondary | ICD-10-CM

## 2022-07-17 NOTE — Telephone Encounter (Signed)
Last AEX 06/30/21--scheduled for 07/31/2022. Last mammo 02/19/2020-WNL   FYI. Sent pt mychart msg recommending that if she didn't already have her mammogram at an external location where we cannot see in our system, then she should schedule at her earliest convenience.

## 2022-07-19 ENCOUNTER — Ambulatory Visit: Payer: Commercial Managed Care - HMO | Admitting: Nurse Practitioner

## 2022-07-24 ENCOUNTER — Encounter: Payer: Self-pay | Admitting: Nurse Practitioner

## 2022-07-24 ENCOUNTER — Ambulatory Visit (INDEPENDENT_AMBULATORY_CARE_PROVIDER_SITE_OTHER): Payer: Commercial Managed Care - HMO | Admitting: Nurse Practitioner

## 2022-07-24 VITALS — BP 128/74 | HR 70 | Ht 61.0 in | Wt 206.6 lb

## 2022-07-24 DIAGNOSIS — E6609 Other obesity due to excess calories: Secondary | ICD-10-CM

## 2022-07-24 DIAGNOSIS — J4520 Mild intermittent asthma, uncomplicated: Secondary | ICD-10-CM | POA: Diagnosis not present

## 2022-07-24 DIAGNOSIS — Z6839 Body mass index (BMI) 39.0-39.9, adult: Secondary | ICD-10-CM | POA: Diagnosis not present

## 2022-07-24 DIAGNOSIS — E66812 Obesity, class 2: Secondary | ICD-10-CM

## 2022-07-24 DIAGNOSIS — G4733 Obstructive sleep apnea (adult) (pediatric): Secondary | ICD-10-CM | POA: Diagnosis not present

## 2022-07-24 MED ORDER — AEROCHAMBER MV MISC: 0 refills | Status: AC

## 2022-07-24 NOTE — Assessment & Plan Note (Signed)
Mild intermittent. Well-controlled on PRN ICS.

## 2022-07-24 NOTE — Patient Instructions (Signed)
Referral to orthodontics for oral appliance   We discussed how untreated sleep apnea puts an individual at risk for cardiac arrhthymias, pulm HTN, DM, stroke and increases their risk for daytime accidents; although, this is lessened with mild severity. We also briefly reviewed treatment options including weight loss, side sleeping position, oral appliance, CPAP therapy or referral to ENT for possible surgical options. Use caution when driving and pull over if sleepy  Continue working on healthy weight loss measures   Follow up in 6 months with Dr. Chase Caller or Alanson Aly. If symptoms do not improve or worsen, please contact office for sooner follow up or seek emergency care.

## 2022-07-24 NOTE — Assessment & Plan Note (Signed)
Mild OSA with AHI 6.4/h.  We discussed potential treatment options.  She opted to try oral appliance and work on weight loss measures.  Referral placed to Dr. Toy Cookey.  Advised her to notify us if this is not cost effective for her.  Cautioned on safe driving practices.  Patient Instructions  Referral to orthodontics for oral appliance   We discussed how untreated sleep apnea puts an individual at risk for cardiac arrhthymias, pulm HTN, DM, stroke and increases their risk for daytime accidents; although, this is lessened with mild severity. We also briefly reviewed treatment options including weight loss, side sleeping position, oral appliance, CPAP therapy or referral to ENT for possible surgical options. Use caution when driving and pull over if sleepy  Continue working on healthy weight loss measures   Follow up in 6 months with Dr. Chase Caller or Alanson Aly. If symptoms do not improve or worsen, please contact office for sooner follow up or seek emergency care.

## 2022-07-24 NOTE — Progress Notes (Signed)
$'@Patient'm$  ID: [name], female    DOB: 04-19-1971, 51 y.o.   MRN: [id number]  Chief Complaint  Patient presents with   Follow-up    Pt f/u to discuss HST results    Referring provider: Deland Pretty, MD  HPI: 51 year old female, never smoker followed for asthma and COVID-pneumonia April 2021.  She is a patient of Dr. Golden Pop and last seen in office 05/03/2022 by Bocephus Cali,NP.  She has since been referred back for sleep consult.  Past medical history significant for GERD, IBS, chronic fatigue, HLD, anxiety, obesity.  TEST/EVENTS:  04/03/2020 PFTs: FEV1 83%, ratio 82, FVC 82%.  No significant BD, mid flow reversibility.  DLCO 89%. 06/29/2022 HST: AHI 6.4/h, SPO2 low 81%, average 90%  04/15/2022: OV with Parrett NP for 53-monthfollow-up.  Breathing has improved.  Trying to be more active.  Does still have some limitations in activity tolerance.  Previously on Trelegy but unable to tolerate due to recurrent thrush.  Did feel like it helped her breathing.  Pulmonary function testing from April 03, 2020 showed normal lung function with mid flow reversibility.  Recommended starting her on Qvar low-dose ICS with spacer to see if this gives her better control and limits potential side effect such as thrush.  She had COVID-pneumonia in April 2021; clinically improved with clear chest x-ray.  Follow-up 3 months  05/03/2022: OV with Deion Swift NP for sleep consult. She has been struggling with daytime fatigue for a long time now but worse over the past few years. She wakes in the morning feeling like she barely slept. She has been told that she snores and she occasionally wakes up gasping for air. She also has struggled with RLS over the past few years and is on Requip, which does seem to help. She occasionally wakes up in the morning with a headache. She denies sleep parasomnias/paralysis, drowsy driving, cataplexy. She goes to bed around 12 am. Takes a while to fall asleep, 1-3 hours, so she takes melatonin  to help with this. Her PCP also put her on gabapentin, which has been helping. She was 2-3 times a night, sometimes to use the restroom. She officially gets up around 10 am.  She is up about 10 pounds over the last 2 years.  She did have a former sleep study in 2005 which did not show any evidence of sleep apnea from what she was told.  She has a history of hypertension.  She is a never smoker.  Does not drink any alcohol.  She is single and lives part of the time on her own and the other time with her mother and father.  She is currently self-employed.  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  Her father has a history of sleep apnea and was previously on CPAP.  Her mother, which is also a patient of our's, has IPF.  From a respiratory standpoint, her breathing has been stable since she was seen last in 2021.  She did have a respiratory virus in the beginning of 2023 and had to have prednisone but otherwise has not required any antibiotics or prednisone since.  She uses Qvar prior to exercise; otherwise, does not usually have to use it.  Denies any wheezing, chest congestion, cough.  07/24/2022: Today - follow up Patient presents today for follow-up after undergoing home sleep study.  She has mild obstructive sleep apnea with AHI 6.4/h.  Continues to have daytime fatigue symptoms.  Feels like she just does not sleep that well.  She also occasionally wakes up gasping for air at night.  She has a history of RLS and is on Requip, which does help with her symptoms.  Denies any sleep parasomnia/paralysis, drowsy driving.  No history of narcolepsy or cataplexy.  Previous Epworth was 8, unchanged today.  Allergies  Allergen Reactions   Allegra [Fexofenadine Hcl] Shortness Of Breath and Rash   Fexofenadine Shortness Of Breath, Rash and Other (See Comments)    Shakes, can't sleep, trouble breathing   Oseltamivir Other (See Comments)    Can't breathe, sick to stomach    Tamiflu [Oseltamivir Phosphate] Itching, Nausea And Vomiting and Other (See Comments)    Can't breathe, sick to stomach   Vancomycin Other (See Comments)    Red man's Syndrome.   Diphenhydramine Other (See Comments)    Shakes, can't sleep, trouble breathing   Latex Itching, Dermatitis and Rash   Sulfamethoxazole Rash   Aspirin Nausea And Vomiting   Doxycycline    Other     NUT ALLERGY   Diphenhydramine Hcl Palpitations and Rash   Morphine Other (See Comments)    REACTION: insomnia   Oseltamivir Phosphate Itching and Nausea And Vomiting   Penicillins Itching and Swelling    Can tolerate Amoxicillin OK.   Sulfonamide Derivatives Rash    Immunization History  Administered Date(s) Administered   Influenza,inj,Quad PF,6+ Mos 06/16/2019   Influenza-Unspecified 07/02/2012, 06/26/2013, 07/07/2014   Pneumococcal Polysaccharide-23 02/20/2012   Tdap 02/20/2012    Past Medical History:  Diagnosis Date   Anxiety    Arrhythmia    heart races when pt in pain   Asthma    Clostridium difficile infection 2014   Epstein Barr virus infection    Fibromyalgia    GERD (gastroesophageal reflux disease)    History of COVID-19    Hypertension    Hypothyroidism    Immune deficiency disorder ([place])    Lyme disease    Mold contact confirmed    Toxic Mold syndrome   Mononucleosis 5-10   Ovarian cyst    Serous Cystadenofibroma-Left ovary   PONV (postoperative nausea and vomiting)    Shortness of breath    on exertion   Systolic murmur    Thyroid disease     Tobacco History: Social History   Tobacco Use  Smoking Status Never  Smokeless Tobacco Never   Counseling given: Not Answered   Outpatient Medications Prior to Visit  Medication Sig Dispense Refill   albuterol (PROAIR HFA) 108 (90 Base) MCG/ACT inhaler Inhale 2 puffs into the lungs every 6 (six) hours as needed for wheezing or shortness of breath. 18 g 3   ALPRAZolam (XANAX XR) 0.5 MG 24 hr tablet Take 0.5 mg by mouth in the  morning and at bedtime. BID     baclofen (LIORESAL) 20 MG tablet Take 10 mg by mouth as needed.     bisoprolol (ZEBETA) 10 MG tablet Take 10 mg by mouth daily.      buprenorphine (BUTRANS) 15 MCG/HR Place onto the skin once a week.     cetirizine (ZYRTEC) 10 MG tablet Take 10 mg by mouth daily.      Cholecalciferol (VITAMIN D PO) Take 1 tablet by mouth daily.      Cholecalciferol (VITAMIN D3 PO) Take 5,000 Int'l Units by mouth daily.     citalopram (CELEXA) 40 MG tablet Take 40 mg by mouth daily.     cloNIDine (CATAPRES) 0.1 MG tablet TAKE  1/2 TABLET BY MOUTH IN THE MORNING AND 1 TAB AT BEDTIME 135 tablet 1   gabapentin (NEURONTIN) 800 MG tablet Take 1 tablet (800 mg total) by mouth in the morning, at noon, in the evening, and at bedtime. 120 tablet 3   hydrOXYzine (ATARAX/VISTARIL) 25 MG tablet Take 25 mg by mouth as needed for anxiety.      Hyoscyamine Sulfate SL 0.125 MG SUBL Take 0.125 mg by mouth every 4 (four) hours as needed for cramping.     ibuprofen (ADVIL) 200 MG tablet Take by mouth daily as needed.     JUNEL FE 1/20 1-20 MG-MCG tablet TAKE 1 TABLET BY MOUTH DAILY. CONTINUOUS USE TO CONTROL MENSES AND ENDOMETRIOSIS. 84 tablet 0   Melatonin 10 MG TABS Take 10 mg by mouth at bedtime.      Mometasone Furoate (ASMANEX HFA) 50 MCG/ACT AERO Inhale 2 puffs into the lungs 2 (two) times daily as needed (shortness of breath or wheezing). 13 g 3   Multiple Vitamin (MULTI-VITAMIN) tablet Take by mouth.     POLY-IRON 150 150 MG capsule Take 300 mg by mouth daily.     Probiotic Product (ALIGN PO) Take 1 capsule by mouth as needed.     prochlorperazine (COMPAZINE) 10 MG tablet Take 10 mg by mouth every 6 (six) hours as needed for nausea or vomiting.      promethazine (PHENERGAN) 25 MG tablet Take 25 mg by mouth every 6 (six) hours as needed for nausea or vomiting.      ranitidine (ZANTAC) 150 MG tablet Take by mouth as needed.     rOPINIRole (REQUIP) 2 MG tablet Take 2 mg by mouth at bedtime.       thyroid (ARMOUR) 120 MG tablet Take by mouth.     valACYclovir (VALTREX) 1000 MG tablet Take by mouth.     vitamin B-12 (CYANOCOBALAMIN) 500 MCG tablet Take 500 mcg by mouth daily.     Spacer/Aero-Holding Chambers (AEROCHAMBER MV) inhaler Use as instructed 1 each 0   No facility-administered medications prior to visit.     Review of Systems:   Constitutional: No night sweats, fevers, chills, or lassitude. +excessive daytime fatigue; weight gain (10lb in 2 yr) HEENT: No headaches, difficulty swallowing, tooth/dental problems, or sore throat. No sneezing, itching, ear ache, nasal congestion, or post nasal drip CV:  +PND. No chest pain, orthopnea, swelling in lower extremities, anasarca, dizziness, palpitations, syncope Resp: +snoring; shortness of breath with exertion (baseline; strenuous activity). No excess mucus or change in color of mucus. No productive or non-productive. No hemoptysis. No wheezing.  No chest wall deformity GI:  No heartburn, indigestion, abdominal pain, nausea, vomiting, diarrhea, change in bowel habits, loss of appetite, bloody stools.  GU: No dysuria, change in color of urine, urgency or frequency.  No flank pain, no hematuria  MSK:  No joint pain or swelling.  No decreased range of motion.  No back pain. Neuro: No dizziness or lightheadedness.  Psych: No depression or anxiety. Mood stable.     Physical Exam:  BP 128/74   Pulse 70   Ht '5\' 1"'$  (1.549 m)   Wt 206 lb 9.6 oz (93.7 kg)   SpO2 97%   BMI 39.04 kg/m   GEN: Pleasant, interactive, well-appearing; obese; in no acute distress. HEENT:  Normocephalic and atraumatic. PERRLA. Sclera white. Nasal turbinates pink, moist and patent bilaterally. No rhinorrhea present. Oropharynx pink and moist, without exudate or edema. No lesions, ulcerations, or postnasal drip. Mallampati II NECK:  Supple w/ fair ROM. No JVD present. Normal carotid impulses w/o bruits. Thyroid symmetrical with no goiter or nodules palpated.  No lymphadenopathy.   CV: RRR, no m/r/g, no peripheral edema. Pulses intact, +2 bilaterally. No cyanosis, pallor or clubbing. PULMONARY:  Unlabored, regular breathing. Clear bilaterally A&P w/o wheezes/rales/rhonchi. No accessory muscle use. No dullness to percussion. GI: BS present and normoactive. Soft, non-tender to palpation.  MSK: No erythema, warmth or tenderness.  Neuro: A/Ox3. No focal deficits noted.   Skin: Warm, no lesions or rashe Psych: Normal affect and behavior. Judgement and thought content appropriate.     Lab Results:  CBC    Component Value Date/Time   WBC 7.7 01/23/2020 1202   WBC 4.9 12/30/2019 0658   RBC 4.47 01/23/2020 1202   RBC 4.19 12/30/2019 0658   HGB 12.7 01/23/2020 1202   HGB 13.9 09/09/2013 1102   HCT 39.5 01/23/2020 1202   HCT 40.9 09/09/2013 1102   PLT 332 01/23/2020 1202   MCV 88 01/23/2020 1202   MCV 89.1 09/09/2013 1102   MCH 28.4 01/23/2020 1202   MCH 28.2 12/30/2019 0658   MCHC 32.2 01/23/2020 1202   MCHC 31.9 12/30/2019 0658   RDW 13.5 01/23/2020 1202   RDW 13.6 09/09/2013 1102   LYMPHSABS 3.1 01/23/2020 1202   LYMPHSABS 3.2 09/09/2013 1102   MONOABS 0.3 12/30/2019 0658   MONOABS 0.5 09/09/2013 1102   EOSABS 0.4 01/23/2020 1202   BASOSABS 0.1 01/23/2020 1202   BASOSABS 0.1 09/09/2013 1102    BMET    Component Value Date/Time   NA 140 01/23/2020 1202   NA 142 09/09/2013 1102   K 4.2 01/23/2020 1202   K 4.3 09/09/2013 1102   CL 99 01/23/2020 1202   CO2 20 01/23/2020 1202   CO2 27 09/09/2013 1102   GLUCOSE 105 (H) 01/23/2020 1202   GLUCOSE 135 (H) 12/30/2019 0658   GLUCOSE 95 09/09/2013 1102   BUN 12 01/23/2020 1202   BUN 11.0 09/09/2013 1102   CREATININE 0.87 01/23/2020 1202   CREATININE 0.9 09/09/2013 1102   CALCIUM 9.3 01/23/2020 1202   CALCIUM 9.9 09/09/2013 1102   GFRNONAA 80 01/23/2020 1202   GFRAA 92 01/23/2020 1202    BNP No results found for: "BNP"   Imaging:  No results found.       Latest Ref  Rng & Units 04/02/2020   12:14 PM  PFT Results  FVC-Pre L 2.59   FVC-Predicted Pre % 80   FVC-Post L 2.65   FVC-Predicted Post % 82   Pre FEV1/FVC % % 82   Post FEV1/FCV % % 82   FEV1-Pre L 2.12   FEV1-Predicted Pre % 81   FEV1-Post L 2.17   DLCO uncorrected ml/min/mmHg 17.26   DLCO UNC% % 89   DLCO corrected ml/min/mmHg 17.26   DLCO COR %Predicted % 89   DLVA Predicted % 97   TLC L 3.90   TLC % Predicted % 84   RV % Predicted % 85     No results found for: "NITRICOXIDE"      Assessment & Plan:   Mild obstructive sleep apnea Mild OSA with AHI 6.4/h.  We discussed potential treatment options.  She opted to try oral appliance and work on weight loss measures.  Referral placed to Dr. Toy Cookey.  Advised her to notify us if this is not cost effective for her.  Cautioned on safe driving practices.  Patient Instructions  Referral to orthodontics for oral appliance  We discussed how untreated sleep apnea puts an individual at risk for cardiac arrhthymias, pulm HTN, DM, stroke and increases their risk for daytime accidents; although, this is lessened with mild severity. We also briefly reviewed treatment options including weight loss, side sleeping position, oral appliance, CPAP therapy or referral to ENT for possible surgical options. Use caution when driving and pull over if sleepy  Continue working on healthy weight loss measures   Follow up in 6 months with Dr. Chase Caller or Alanson Aly. If symptoms do not improve or worsen, please contact office for sooner follow up or seek emergency care.    Obesity BMI 39.4. Discussed the correlation between OSA and obesity. Healthy weight loss encouraged.   Asthma Mild intermittent. Well-controlled on PRN ICS.    I spent 32 minutes of dedicated to the care of this patient on the date of this encounter to include pre-visit review of records, face-to-face time with the patient discussing conditions above, post visit ordering of testing,  clinical documentation with the electronic health record, making appropriate referrals as documented, and communicating necessary findings to members of the patients care team.  Clayton Bibles, NP 07/24/2022  Pt aware and understands NP's role.

## 2022-07-24 NOTE — Assessment & Plan Note (Addendum)
BMI 39.4. Discussed the correlation between OSA and obesity. Healthy weight loss encouraged.

## 2022-07-31 ENCOUNTER — Ambulatory Visit (INDEPENDENT_AMBULATORY_CARE_PROVIDER_SITE_OTHER): Payer: Commercial Managed Care - HMO | Admitting: Obstetrics & Gynecology

## 2022-07-31 ENCOUNTER — Encounter: Payer: Self-pay | Admitting: Obstetrics & Gynecology

## 2022-07-31 VITALS — BP 138/82 | HR 72 | Resp 20 | Ht 60.43 in | Wt 210.0 lb

## 2022-07-31 DIAGNOSIS — N3010 Interstitial cystitis (chronic) without hematuria: Secondary | ICD-10-CM | POA: Insufficient documentation

## 2022-07-31 DIAGNOSIS — Z8742 Personal history of other diseases of the female genital tract: Secondary | ICD-10-CM | POA: Diagnosis not present

## 2022-07-31 DIAGNOSIS — Z3041 Encounter for surveillance of contraceptive pills: Secondary | ICD-10-CM

## 2022-07-31 DIAGNOSIS — Z01419 Encounter for gynecological examination (general) (routine) without abnormal findings: Secondary | ICD-10-CM

## 2022-07-31 MED ORDER — NORETHIN ACE-ETH ESTRAD-FE 1-20 MG-MCG PO TABS: ORAL_TABLET | ORAL | 4 refills | Status: DC

## 2022-07-31 NOTE — Progress Notes (Signed)
[name] 1971-06-29 [id number]   History:    51 y.o. G0 Single/[place]   RP:  Established patient presenting for annual gyn exam    HPI: On Junel Fe 1/20 controlling menometrorrhagia and dysmenorrhea with H/O Endometriosis. No BTB.  No pelvic pain.  [place].  Breasts normal.  Overdue for Mammo, will schedule now.  Urinary frequency. H/O Interstitial Cystitis. BMs wnl.  BMI increased t0 40.43.  Not exercising regularly. Helping her family a lot.  Fam MD for Health labs.  [place] 5 yrs ago per patient.    Past medical history,surgical history, family history and social history were all reviewed and documented in the EPIC chart.  Gynecologic History No LMP recorded. (Menstrual status: Oral contraceptives).  Obstetric History OB History  Gravida Para Term Preterm AB Living  0            SAB IAB Ectopic Multiple Live Births                ROS: A ROS was performed and pertinent positives and negatives are included in the history. GENERAL: No fevers or chills. HEENT: No change in vision, no earache, sore throat or sinus congestion. NECK: No pain or stiffness. CARDIOVASCULAR: No chest pain or pressure. No palpitations. PULMONARY: No shortness of breath, cough or wheeze. GASTROINTESTINAL: No abdominal pain, nausea, vomiting or diarrhea, melena or bright red blood per rectum. GENITOURINARY: No urinary frequency, urgency, hesitancy or dysuria. MUSCULOSKELETAL: No joint or muscle pain, no back pain, no recent trauma. DERMATOLOGIC: No rash, no itching, no lesions. ENDOCRINE: No polyuria, polydipsia, no heat or cold intolerance. No recent change in weight. HEMATOLOGICAL: No anemia or easy bruising or bleeding. NEUROLOGIC: No headache, seizures, numbness, tingling or weakness. PSYCHIATRIC: No depression, no loss of interest in normal activity or change in sleep pattern.     Exam:   BP 138/82 (BP Location: Right Arm, Patient Position: Sitting)   Pulse 72   Resp 20   Ht 5' 0.43" (1.535 m)    Wt 210 lb (95.3 kg)   SpO2 100%   BMI 40.43 kg/m   Body mass index is 40.43 kg/m.  General appearance : Well developed well nourished female. No acute distress HEENT: Eyes: no retinal hemorrhage or exudates,  Neck supple, trachea midline, no carotid bruits, no thyroidmegaly Lungs: Clear to auscultation, no rhonchi or wheezes, or rib retractions  Heart: Regular rate and rhythm, no murmurs or gallops Breast:Examined in sitting and supine position were symmetrical in appearance, no palpable masses or tenderness,  no skin retraction, no nipple inversion, no nipple discharge, no skin discoloration, no axillary or supraclavicular lymphadenopathy Abdomen: no palpable masses or tenderness, no rebound or guarding Extremities: no edema or skin discoloration or tenderness  Pelvic: Vulva: Normal             Vagina: No gross lesions or discharge  Cervix: No gross lesions or discharge  Uterus  AV, normal size, shape and consistency, non-tender and mobile  Adnexa  Without masses or tenderness  Anus: Normal   Assessment/Plan:  51 y.o. female for annual exam   1. Well female exam with routine gynecological exam On Junel Fe 1/20 controlling menometrorrhagia and dysmenorrhea with H/O Endometriosis. No BTB.  No pelvic pain.  [place].  Breasts normal.  Overdue for Mammo, will schedule now.  Urinary frequency. H/O Interstitial Cystitis. BMs wnl.  BMI increased t0 40.43.  Not exercising regularly. Helping her family a lot.  Fam MD for Health labs.  [place] 5  yrs ago per patient.   2. Encounter for surveillance of contraceptive pills Well on Junel Fe 1/20 continuously for cycle control.  No CI.  Prescription sent to pharmacy. - norethindrone-ethinyl estradiol-FE (JUNEL FE 1/20) 1-20 MG-MCG tablet; TAKE 1 TABLET BY MOUTH DAILY. CONTINUOUS USE TO CONTROL MENSES AND ENDOMETRIOSIS.  3. History of endometriosis As above.  4. Interstitial cystitis Currently asymptomatic.  Other orders - acetaminophen  (TYLENOL) 650 MG CR tablet; Take by mouth. - fluticasone (FLONASE) 50 MCG/ACT nasal spray   Princess Bruins MD, 3:11 PM

## 2022-09-14 ENCOUNTER — Ambulatory Visit: Payer: Commercial Managed Care - HMO | Admitting: Psychiatry

## 2022-10-03 ENCOUNTER — Telehealth: Payer: Self-pay | Admitting: Psychiatry

## 2022-10-03 ENCOUNTER — Other Ambulatory Visit: Payer: Self-pay | Admitting: Psychiatry

## 2022-10-03 DIAGNOSIS — F4001 Agoraphobia with panic disorder: Secondary | ICD-10-CM

## 2022-10-03 DIAGNOSIS — F411 Generalized anxiety disorder: Secondary | ICD-10-CM

## 2022-10-03 MED ORDER — GABAPENTIN 600 MG PO TABS: 600.0000 mg | ORAL_TABLET | Freq: Four times a day (QID) | ORAL | 0 refills | Status: DC

## 2022-10-03 NOTE — Telephone Encounter (Signed)
Patient is requesting to titrate down on the Gabapentin . She tried to cut in half without advisement, but she is not having success. Per another Dr.s advisement she was advised to reach out to Dr Clovis Pu. Patient is scheduled for 3/25. A reschedule from 2/15 due to provider being out.  Contact # [phone]

## 2022-10-03 NOTE — Telephone Encounter (Signed)
Please see message

## 2022-10-03 NOTE — Telephone Encounter (Signed)
Pt wants to decrease gabapentin due to doing some research and finding that it can contribute to bladder problems ,which she already has.Also she noticed that she is "not as sharp."She tried taking a half tab and it made her shaky,have headaches,and her pain level was out of control.Please advise how she can reduce it.

## 2022-10-03 NOTE — Telephone Encounter (Signed)
Yes she can trigger withdrawal symptoms if she drops the dose of gabapentin too much at 1 time.  But it is not a difficult medicine to reduce if it is done gradually.  I sent in a prescription for 600 mg tablet instead of the 800 mg tablet and she takes 1 4 times a day and that transition should be fairly easy but give it about a week for her body to adjust.

## 2022-10-03 NOTE — Telephone Encounter (Signed)
Pt informed

## 2022-10-13 ENCOUNTER — Emergency Department (HOSPITAL_BASED_OUTPATIENT_CLINIC_OR_DEPARTMENT_OTHER): Payer: Commercial Managed Care - HMO

## 2022-10-13 ENCOUNTER — Other Ambulatory Visit: Payer: Self-pay

## 2022-10-13 ENCOUNTER — Emergency Department (HOSPITAL_BASED_OUTPATIENT_CLINIC_OR_DEPARTMENT_OTHER)
Admission: EM | Admit: 2022-10-13 | Discharge: 2022-10-13 | Disposition: A | Discharge status: Home / Self Care | Payer: Commercial Managed Care - HMO | Attending: Emergency Medicine | Admitting: Emergency Medicine

## 2022-10-13 ENCOUNTER — Encounter (HOSPITAL_BASED_OUTPATIENT_CLINIC_OR_DEPARTMENT_OTHER): Payer: Self-pay

## 2022-10-13 DIAGNOSIS — Z7901 Long term (current) use of anticoagulants: Secondary | ICD-10-CM | POA: Insufficient documentation

## 2022-10-13 DIAGNOSIS — I82612 Acute embolism and thrombosis of superficial veins of left upper extremity: Secondary | ICD-10-CM | POA: Diagnosis not present

## 2022-10-13 DIAGNOSIS — Z7982 Long term (current) use of aspirin: Secondary | ICD-10-CM | POA: Insufficient documentation

## 2022-10-13 DIAGNOSIS — Z91040 Latex allergy status: Secondary | ICD-10-CM | POA: Insufficient documentation

## 2022-10-13 DIAGNOSIS — M7989 Other specified soft tissue disorders: Secondary | ICD-10-CM | POA: Diagnosis present

## 2022-10-13 LAB — BASIC METABOLIC PANEL
Anion gap: 9 (ref 5–15)
BUN: 8 mg/dL (ref 6–20)
CO2: 28 mmol/L (ref 22–32)
Calcium: 9.2 mg/dL (ref 8.9–10.3)
Chloride: 103 mmol/L (ref 98–111)
Creatinine, Ser: 0.89 mg/dL (ref 0.44–1.00)
GFR, Estimated: >60 mL/min (ref >60)
Glucose, Bld: 93 mg/dL (ref 70–99)
Potassium: 4.1 mmol/L (ref 3.5–5.1)
Sodium: 140 mmol/L (ref 135–145)

## 2022-10-13 LAB — CBC WITH DIFFERENTIAL/PLATELET
Abs Immature Granulocytes: 0.03 10*3/uL (ref 0.00–0.07)
Basophils Absolute: 0 10*3/uL (ref 0.0–0.1)
Basophils Relative: 0 %
Eosinophils Absolute: 0.4 10*3/uL (ref 0.0–0.5)
Eosinophils Relative: 4 %
HCT: 39.9 % (ref 36.0–46.0)
Hemoglobin: 13.2 g/dL (ref 12.0–15.0)
Immature Granulocytes: 0 %
Lymphocytes Relative: 27 %
Lymphs Abs: 2.8 10*3/uL (ref 0.7–4.0)
MCH: 28.8 pg (ref 26.0–34.0)
MCHC: 33.1 g/dL (ref 30.0–36.0)
MCV: 86.9 fL (ref 80.0–100.0)
Monocytes Absolute: 0.5 10*3/uL (ref 0.1–1.0)
Monocytes Relative: 5 %
Neutro Abs: 6.5 10*3/uL (ref 1.7–7.7)
Neutrophils Relative %: 64 %
Platelets: 308 10*3/uL (ref 150–400)
RBC: 4.59 MIL/uL (ref 3.87–5.11)
RDW: 12.6 % (ref 11.5–15.5)
WBC: 10.2 10*3/uL (ref 4.0–10.5)
nRBC: 0 % (ref 0.0–0.2)

## 2022-10-13 MED ORDER — ASPIRIN 325 MG PO TBEC: 325.0000 mg | DELAYED_RELEASE_TABLET | Freq: Every day | ORAL | 0 refills | Status: DC

## 2022-10-13 NOTE — ED Notes (Signed)
[place] at Pt's bedside.

## 2022-10-13 NOTE — ED Provider Notes (Signed)
[facility] Provider Note   CSN: [id number] Arrival date & time: 10/13/22  1444     History  Chief Complaint  Patient presents with   Arm Swelling    [name] is a 52 y.o. female history of interstitial cystitis, anxiety here presenting with left arm swelling.  Patient states that she just went to [place] to celebrate her 52th birthday.  She states that she came back 2 weeks ago.  She woke up yesterday with left arm swelling.  She thought she slept on it wrong.  She denies any chest pain or shortness of breath.  Patient states that her mother has factor V Leiden and has multiple blood clots in the past.  Patient is concerned that she may have a blood clot.  The history is provided by the patient.       Home Medications Prior to Admission medications   Medication Sig Start Date End Date Taking? Authorizing Provider  acetaminophen (TYLENOL) 650 MG CR tablet Take by mouth.    [provider]  albuterol (PROAIR HFA) 108 (90 Base) MCG/ACT inhaler Inhale 2 puffs into the lungs every 6 (six) hours as needed for wheezing or shortness of breath. 11/24/19   Tanda Rockers, MD  ALPRAZolam (XANAX XR) 0.5 MG 24 hr tablet Take 0.5 mg by mouth in the morning and at bedtime. BID    [provider]  baclofen (LIORESAL) 20 MG tablet Take 10 mg by mouth as needed. 05/23/18   [provider]  bisoprolol (ZEBETA) 10 MG tablet Take 10 mg by mouth daily.     [provider]  buprenorphine (BUTRANS) 15 MCG/HR Place onto the skin once a week.    [provider]  cetirizine (ZYRTEC) 10 MG tablet Take 10 mg by mouth daily.     [provider]  Cholecalciferol (VITAMIN D3 PO) Take 5,000 Int'l Units by mouth daily.    [provider]  citalopram (CELEXA) 40 MG tablet Take 40 mg by mouth daily.    [provider]  cloNIDine (CATAPRES) 0.1 MG tablet TAKE 1/2 TABLET BY MOUTH IN THE MORNING AND 1  TAB AT BEDTIME 07/03/22   Cottle, Billey Co., MD  fluticasone [facility]) 50 MCG/ACT nasal spray     [provider]  gabapentin (NEURONTIN) 600 MG tablet Take 1 tablet (600 mg total) by mouth in the morning, at noon, in the evening, and at bedtime. 10/03/22   Cottle, Billey Co., MD  hydrOXYzine (ATARAX/VISTARIL) 25 MG tablet Take 25 mg by mouth as needed for anxiety.  08/19/17   [provider]  Hyoscyamine Sulfate SL 0.125 MG SUBL Take 0.125 mg by mouth every 4 (four) hours as needed for cramping. 05/23/18   [provider]  ibuprofen (ADVIL) 200 MG tablet Take by mouth daily as needed.    [provider]  Melatonin 10 MG TABS Take 10 mg by mouth at bedtime.     [provider]  Mometasone Furoate [facility] HFA) 50 MCG/ACT AERO Inhale 2 puffs into the lungs 2 (two) times daily as needed (shortness of breath or wheezing). Patient not taking: Reported on 07/31/2022 05/04/22   Clayton Bibles, NP  Multiple Vitamin (MULTI-VITAMIN) tablet Take by mouth.    [provider]  norethindrone-ethinyl estradiol-FE (JUNEL FE 1/20) 1-20 MG-MCG tablet TAKE 1 TABLET BY MOUTH DAILY. CONTINUOUS USE TO CONTROL MENSES AND ENDOMETRIOSIS. 07/31/22   Princess Bruins, MD  POLY-IRON 150  150 MG capsule Take 300 mg by mouth daily. Patient not taking: Reported on 07/31/2022 03/04/20   [provider]  Probiotic Product (ALIGN PO) Take 1 capsule by mouth as needed.    [provider]  prochlorperazine (COMPAZINE) 10 MG tablet Take 10 mg by mouth every 6 (six) hours as needed for nausea or vomiting.     [provider]  promethazine (PHENERGAN) 25 MG tablet Take 25 mg by mouth every 6 (six) hours as needed for nausea or vomiting.     [provider]  ranitidine (ZANTAC) 150 MG tablet Take by mouth as needed.    [provider]  rOPINIRole (REQUIP) 2 MG tablet Take 2 mg by mouth at bedtime.     [provider]   Spacer/Aero-Holding Chambers (AEROCHAMBER MV) inhaler Use with inhaler 07/24/22   Cobb, Karie Schwalbe, NP  thyroid (ARMOUR) 120 MG tablet Take by mouth. 07/01/13   [provider]  vitamin B-12 (CYANOCOBALAMIN) 500 MCG tablet Take 500 mcg by mouth daily.    [provider]      Allergies    Allegra [fexofenadine hcl], Fexofenadine, Oseltamivir, Tamiflu [oseltamivir phosphate], Vancomycin, Diphenhydramine, Latex, Sulfamethoxazole, Aspirin, Doxycycline, Other, Diphenhydramine hcl, Morphine, Oseltamivir phosphate, Penicillins, and Sulfonamide derivatives    Review of Systems   Review of Systems  Musculoskeletal:        Left arm swelling  All other systems reviewed and are negative.   Physical Exam Updated Vital Signs BP (!) 133/91 (BP Location: Right Arm)   Pulse 72   Temp 98.5 F (36.9 C) (Oral)   Resp 18   Ht 5' (1.524 m)   Wt 95.3 kg   SpO2 99%   BMI 41.03 kg/m  Physical Exam Vitals and nursing note reviewed.  Constitutional:      Appearance: Normal appearance.  HENT:     Head: Normocephalic.     Nose: Nose normal.     Mouth/Throat:     Mouth: Mucous membranes are moist.  Eyes:     Extraocular Movements: Extraocular movements intact.     Pupils: Pupils are equal, round, and reactive to light.  Cardiovascular:     Rate and Rhythm: Normal rate.     Pulses: Normal pulses.  Pulmonary:     Effort: Pulmonary effort is normal.     Breath sounds: Normal breath sounds.  Abdominal:     General: Abdomen is flat.     Palpations: Abdomen is soft.  Musculoskeletal:     Cervical back: Normal range of motion.     Comments: Left arm slightly more swollen compared to the right.  Patient has 2+ radial pulses.  Patient has no signs of track marks or cellulitis.  No obvious lymphadenopathy in the left axilla and left antecube   Neurological:     General: No focal deficit present.     Mental Status: She is alert.  Psychiatric:        Mood and Affect: Mood normal.      ED Results / Procedures / Treatments   Labs (all labs ordered are listed, but only abnormal results are displayed) Labs Reviewed  CBC WITH DIFFERENTIAL/PLATELET  BASIC METABOLIC PANEL    EKG None  Radiology No results found.  Procedures Procedures    Medications Ordered in ED Medications - No data to display  ED Course/ Medical Decision Making/ A&P  Medical Decision Making MONZERATH PENDELL is a 52 y.o. female here with left arm pain and swelling after travel.  She has family history of DVT as well.  Will get DVT study and blood work.  I do not see any signs of cellulitis and she adamantly denies using drugs.  5:34 PM I reviewed patient's labs and intimately interpreted imaging study.  Patient has no DVT in the left upper extremity but does have superficial thrombophlebitis of the left basilic vein.  We discussed treatment options including arm sleeve and warm compresses versus aspirin versus Eliquis.  I reviewed the guidelines regarding this matter and patient does not have a previous DVT and this is a provoked thrombophlebitis from recent travel.  I think full dose aspirin for a month and repeat ultrasound is a reasonable option.  Patient does have intolerance to aspirin previously.  I have prescribed enteric-coated aspirin.  Recommend that she get outpatient ultrasound in a month.  Problems Addressed: Acute basilic vein thrombosis, left: acute illness or injury  Amount and/or Complexity of Data Reviewed Labs: ordered. Decision-making details documented in ED Course.  Risk OTC drugs.    Final Clinical Impression(s) / ED Diagnoses Final diagnoses:  None    Rx / DC Orders ED Discharge Orders     None         Drenda Freeze, MD 10/13/22 1736

## 2022-10-13 NOTE — Discharge Instructions (Signed)
As we discussed, you have left basilic vein thrombosis which is a superficial vein blood clot.  I recommend that you take enteric-coated aspirin daily for the next month and use compression sleeve and warm compresses.  You need to talk to your doctor and schedule a repeat ultrasound in a month to make sure the clot resolves  Return to ER if you have worse arm swelling, chest pain, shortness of breath

## 2022-10-13 NOTE — ED Notes (Signed)
Discharge instructions, prescription, follow up care reviewed and explained, pt verbalized understanding and had no further questions on d/c. Pt caox4, ambulatory, NAD on d/c.

## 2022-10-13 NOTE — ED Triage Notes (Signed)
Patient here POV from Home.  Endorses Left Proximal Forearm Swelling and pain yesterday that worsened today.   [employer] Flight to [place] 2.5 Weeks ago. No Fevers. No Warmth noted. No Trauma  NAD Noted during Triage. A&Ox4. GCS 15. Ambulatory.

## 2022-10-17 ENCOUNTER — Telehealth: Payer: Self-pay

## 2022-10-17 NOTE — Telephone Encounter (Signed)
Patient called in voice mail. Seen in [facility] ED on 10/13/22. ED note in chart. Patient said she has "blood clot in her arm" and questions if she should d/c Junel.  10/13/22 ED report  "Medical Decision Making [name] is a 52 y.o. female here with left arm pain and swelling after travel.  She has family history of DVT as well.  Will get DVT study and blood work.  I do not see any signs of cellulitis and she adamantly denies using drugs.   5:34 PM I reviewed patient's labs and intimately interpreted imaging study.  Patient has no DVT in the left upper extremity but does have superficial thrombophlebitis of the left basilic vein.  We discussed treatment options including arm sleeve and warm compresses versus aspirin versus Eliquis.  I reviewed the guidelines regarding this matter and patient does not have a previous DVT and this is a provoked thrombophlebitis from recent travel.  I think full dose aspirin for a month and repeat ultrasound is a reasonable option.  Patient does have intolerance to aspirin previously.  I have prescribed enteric-coated aspirin.  Recommend that she get outpatient ultrasound in a month.   Problems Addressed: Acute basilic vein thrombosis, left: acute illness or injury

## 2022-10-18 ENCOUNTER — Telehealth: Payer: Self-pay

## 2022-10-18 ENCOUNTER — Other Ambulatory Visit: Payer: Self-pay | Admitting: Registered Nurse

## 2022-10-18 DIAGNOSIS — I82612 Acute embolism and thrombosis of superficial veins of left upper extremity: Secondary | ICD-10-CM

## 2022-10-18 NOTE — Telephone Encounter (Signed)
Pt calling to report recent dx of DVT in her arm. States her PCP advised her to stop taking COCs and contact GYN provider about switching to a POP option. Pt calling to inquire and also inquiring if she will need to make an appt? Please advise.   Last AEX 07/31/2022--scheduled for 08/06/2023.  Last mammo seen 02/19/2020-neg birads 1

## 2022-10-19 ENCOUNTER — Ambulatory Visit: Payer: Commercial Managed Care - HMO | Admitting: Psychiatry

## 2022-10-19 ENCOUNTER — Other Ambulatory Visit: Payer: Commercial Managed Care - HMO

## 2022-10-19 NOTE — Telephone Encounter (Signed)
Message received from front office for assistance with scheduling.  Spoke with patient. Patient anxious to see Dr. Dellis Filbert to discuss contraceptive options.  Confirmed patient stopped OCP.  OV scheduled for 10/20/22 with Dr. Dellis Filbert at 1000.  Questions answered.  Patient verbalizes understanding and agreeable.   Routing to provider for final review. Patient is agreeable to disposition. Will close encounter.

## 2022-10-19 NOTE — Telephone Encounter (Signed)
Beth Bruins, MD  You35 minutes ago (1:44 PM)   Yes stop BCPs and recommend strict condom use. ML     I spoke with patient and advised. Patient is inquiring about progesterone only bc. She is not sexually active but she said her periods are extremely heavy and she has endometriosis and that is why on pills.  I recommended OV with Dr. Marguerita Merles to discuss options.  Message given to [place] who had originally spoken with her to call patient back and schedule visit.

## 2022-10-20 ENCOUNTER — Ambulatory Visit (INDEPENDENT_AMBULATORY_CARE_PROVIDER_SITE_OTHER): Payer: Commercial Managed Care - HMO | Admitting: Obstetrics & Gynecology

## 2022-10-20 ENCOUNTER — Encounter: Payer: Self-pay | Admitting: Obstetrics & Gynecology

## 2022-10-20 ENCOUNTER — Telehealth: Payer: Self-pay | Admitting: Hematology

## 2022-10-20 VITALS — BP 110/68 | HR 71

## 2022-10-20 DIAGNOSIS — Z8742 Personal history of other diseases of the female genital tract: Secondary | ICD-10-CM

## 2022-10-20 DIAGNOSIS — N951 Menopausal and female climacteric states: Secondary | ICD-10-CM

## 2022-10-20 DIAGNOSIS — I809 Phlebitis and thrombophlebitis of unspecified site: Secondary | ICD-10-CM

## 2022-10-20 NOTE — Progress Notes (Signed)
    [name] 07-14-1971 [id number]        52 y.o.  G0P0   RP: Superficial VT left forearm/stopped BCPs x 2 days  HPI: Patient was on Junel 1/20 to control symptoms of Endometriosis. Developed a superficial TP of the Left forearm.  Stopped BCPS x 2 days.  Started on Eliquis 5 mg PO daily. [place]. No pelvic pain and no vaginal on BCPs or since she stopped.  Mother with h/o Factor V Leiden/PE.  Patient scheduled with [facility] next week.   OB History  Gravida Para Term Preterm AB Living  0            SAB IAB Ectopic Multiple Live Births               Past medical history,surgical history, problem list, medications, allergies, family history and social history were all reviewed and documented in the EPIC chart.   Directed ROS with pertinent positives and negatives documented in the history of present illness/assessment and plan.  Exam:  Vitals:   10/20/22 1010  BP: 110/68  Pulse: 71  SpO2: 99%   General appearance:  Normal  Gynecologic exam: Deferred   Assessment/Plan:  52 y.o. G0P0   1. History of endometriosis Patient was on Junel 1/20 to control symptoms of Endometriosis.  Developed a superficial TP of the Left forearm.  Stopped BCPS x 2 days.  Started on Eliquis 5 mg PO daily. [place]. No pelvic pain and no vaginal on BCPs or since she stopped.  Mother with h/o Factor V Leiden/PE.  Patient scheduled with [facility] next week.    2. Superficial phlebitis Developed a superficial TP of the Left forearm.  Stopped BCPS x 2 days.  Started on Eliquis 5 mg PO daily.  Mother with h/o Factor V Leiden/PE.  Patient scheduled with [facility] next week.    3. Perimenopause Will check menopausal status.  If menopausal, should not have problems with Endometriosis in the future.  If not menopausal, recommend observing for symptoms of menopause before starting on any treatment. - [facility]  Other orders - apixaban (ELIQUIS) 5 MG TABS tablet; Take 5 mg by mouth 2 (two) times daily.   Princess Bruins MD, 10:17 AM 10/20/2022

## 2022-10-20 NOTE — Telephone Encounter (Signed)
Scheduled appt per 2/14 referral. Pt is aware of appt date and time. Pt is aware to arrive 15 mins prior to appt time and to bring and updated insurance card. Pt is aware of appt location.

## 2022-10-21 LAB — FOLLICLE STIMULATING HORMONE: FSH: 7.8 m[IU]/mL

## 2022-10-23 ENCOUNTER — Other Ambulatory Visit: Payer: Commercial Managed Care - HMO

## 2022-10-25 ENCOUNTER — Ambulatory Visit
Admission: RE | Admit: 2022-10-25 | Discharge: 2022-10-25 | Disposition: A | Discharge status: Home / Self Care | Payer: Commercial Managed Care - HMO | Source: Ambulatory Visit | Attending: Registered Nurse | Admitting: Registered Nurse

## 2022-10-25 ENCOUNTER — Other Ambulatory Visit: Payer: Commercial Managed Care - HMO

## 2022-10-25 DIAGNOSIS — I82612 Acute embolism and thrombosis of superficial veins of left upper extremity: Secondary | ICD-10-CM

## 2022-10-25 NOTE — Telephone Encounter (Signed)
See telephone encounter 10/17/2022. Will close encounter.

## 2022-11-03 NOTE — Progress Notes (Signed)
HEMATOLOGY/ONCOLOGY CONSULTATION NOTE  Date of Service: 11/03/2022  Patient Care Team: Deland Pretty, MD as PCP - General (Internal Medicine) Croitoru, Dani Gobble, MD as PCP - Cardiology (Cardiology) [name], NP as Nurse Practitioner (Pulmonary Disease) Cottle, Billey Co., MD as Attending Physician (Psychiatry) Princess Bruins, MD as Consulting Physician (Obstetrics and Gynecology)  CHIEF COMPLAINTS/PURPOSE OF CONSULTATION:  Evaluation and management of Fhx of factor v leiden mutation  HISTORY OF PRESENTING ILLNESS:   [name] is a wonderful 52 y.o. female who has been referred to [place] by Deland Pretty, MD for evaluation and management of superficial venous clots.  Today, she is accompanied by her sister. Patient confirms that she has never had a DVT. Patient does however have superficial venous clots. Patient was previously tested for factor V leiden and reports that she tested negative, though she is not 100% sure. She also reports that she has severe endometriosis.  She does complain of a constant headache that began about two weeks ago. This has not been evaluated by her PCP. This may have been triggered by her hormonal medications.   In regards to Fhx, her mother has a hx of  Factor V Leiden, PE, and plenty of clots in her brain and lungs. She is unsure if it was Heterozygous or homozygous in origin. Her mother has also had issues with pregnancy and has had 6 bypass surgeries. She reports that her father has had vein circulation issues.   Patient reports that she has lyme diease. She initially received a tick bite in her upper left arm around 2006 but denies any blood clots. She noticed a large swollen knot in her upper left arm. She denies any injuries, IV lines, or blood drawn in that area.   Patient denies any previous issues with miscarriages, spine pain, abdominal pain, unexpected weight loss, fever, chills, or night sweats. She does complain of sudden spasms in  her arm. No swelling prior to her pain.  She has received two sigmoidoscopy previously, but has not had a recent mammogram or colonoscopy.  Her left arm swelling has improved by approximately 75% since it was significantly swollen. She has no otherwise swelling in her hand.  She has been on hormonal pills for about 30 years which did not trigger any blood clots. Patient had previously taken Eliquis for about a week.    MEDICAL HISTORY:  Past Medical History:  Diagnosis Date   Anxiety    Arrhythmia    heart races when pt in pain   Asthma    Clostridium difficile infection 2014   Epstein Barr virus infection    Fibromyalgia    GERD (gastroesophageal reflux disease)    History of COVID-19    Hypertension    Hypothyroidism    Immune deficiency disorder ([place])    Interstitial cystitis    Lyme disease    Mold contact confirmed    Toxic Mold syndrome   Mononucleosis 01/2009   Ovarian cyst    Serous Cystadenofibroma-Left ovary   PONV (postoperative nausea and vomiting)    Shortness of breath    on exertion   Systolic murmur    Thrombophlebitis    Thyroid disease     SURGICAL HISTORY: Past Surgical History:  Procedure Laterality Date   BLADDER SURGERY     CHOLECYSTECTOMY     FECAL TRANSPLANT     Groin cyst     LAPAROSCOPY Right 06/12/2013   Procedure: LAPAROSCOPY OPERATIVE  fulgeration of endometriosis, right salpingooophorectomy;  Surgeon: Terrance Mass, MD;  Location: [facility];  Service: Gynecology;  Laterality: Right;   Nose cyst     OOPHORECTOMY Right 06/12/2013   Procedure: OOPHORECTOMY;POSS RIGHT [place];  Surgeon: Terrance Mass, MD;  Location: [facility];  Service: Gynecology;  Laterality: Right;   PELVIC LAPAROSCOPY     DL Ovarian cystectomy-Left   TONSILLECTOMY  2009    SOCIAL HISTORY: Social History   Socioeconomic History   Marital status: Single    Spouse name: Not on file   Number of children: 0   Years of education: college   Highest  education level: Not on file  Occupational History   Occupation: Teacher  Tobacco Use   Smoking status: Never   Smokeless tobacco: Never  Vaping Use   Vaping Use: Never used  Substance and Sexual Activity   Alcohol use: No    Alcohol/week: 0.0 standard drinks of alcohol   Drug use: No   Sexual activity: Not Currently    Birth control/protection: Abstinence  Other Topics Concern   Not on file  Social History Narrative   Lives at home alone.   Right-handed.   Caffeine use: 1 cup per day.   Social Determinants of Health   Financial Resource Strain: Not on file  Food Insecurity: Not on file  Transportation Needs: Not on file  Physical Activity: Not on file  Stress: Not on file  Social Connections: Not on file  Intimate Partner Violence: Not on file    FAMILY HISTORY: Family History  Problem Relation Age of Onset   Rheum arthritis Mother    Hypertension Mother    Diabetes Mother    Pulmonary embolism Mother        x2   Heart disease Mother    Other Mother        factor 5   Hypertension Father    Thyroid disease Father    Breast cancer Maternal Aunt        Age 65   Pancreatic cancer Maternal Grandmother    Heart disease Maternal Grandfather    Breast cancer Paternal Grandmother        Age 45   Asthma Paternal Grandmother     ALLERGIES:  is allergic to [employer] hcl], fexofenadine, oseltamivir, tamiflu [oseltamivir phosphate], vancomycin, diphenhydramine, latex, sulfamethoxazole, aspirin, doxycycline, other, diphenhydramine hcl, morphine, oseltamivir phosphate, penicillins, and sulfonamide derivatives.  MEDICATIONS:  Current Outpatient Medications  Medication Sig Dispense Refill   acetaminophen (TYLENOL) 650 MG CR tablet Take by mouth.     albuterol (PROAIR HFA) 108 (90 Base) MCG/ACT inhaler Inhale 2 puffs into the lungs every 6 (six) hours as needed for wheezing or shortness of breath. 18 g 3   ALPRAZolam (XANAX XR) 0.5 MG 24 hr tablet Take 0.5 mg by  mouth in the morning and at bedtime. BID     apixaban (ELIQUIS) 5 MG TABS tablet Take 5 mg by mouth 2 (two) times daily.     baclofen (LIORESAL) 20 MG tablet Take 10 mg by mouth as needed.     bisoprolol (ZEBETA) 10 MG tablet Take 10 mg by mouth daily.      buprenorphine (BUTRANS) 15 MCG/HR Place onto the skin once a week.     cetirizine (ZYRTEC) 10 MG tablet Take 10 mg by mouth daily.      Cholecalciferol (VITAMIN D3 PO) Take 5,000 Int'l Units by mouth daily.     citalopram (CELEXA) 40 MG tablet Take 40 mg by mouth daily.  cloNIDine (CATAPRES) 0.1 MG tablet TAKE 1/2 TABLET BY MOUTH IN THE MORNING AND 1 TAB AT BEDTIME 135 tablet 1   fluticasone (FLONASE) 50 MCG/ACT nasal spray      gabapentin (NEURONTIN) 600 MG tablet Take 1 tablet (600 mg total) by mouth in the morning, at noon, in the evening, and at bedtime. 120 tablet 0   hydrOXYzine (ATARAX/VISTARIL) 25 MG tablet Take 25 mg by mouth as needed for anxiety.      Hyoscyamine Sulfate SL 0.125 MG SUBL Take 0.125 mg by mouth every 4 (four) hours as needed for cramping.     Melatonin 10 MG TABS Take 10 mg by mouth at bedtime.      Multiple Vitamin (MULTI-VITAMIN) tablet Take by mouth.     Probiotic Product (ALIGN PO) Take 1 capsule by mouth as needed.     prochlorperazine (COMPAZINE) 10 MG tablet Take 10 mg by mouth every 6 (six) hours as needed for nausea or vomiting.      promethazine (PHENERGAN) 25 MG tablet Take 25 mg by mouth every 6 (six) hours as needed for nausea or vomiting.      rOPINIRole (REQUIP) 2 MG tablet Take 2 mg by mouth at bedtime.      Spacer/Aero-Holding Chambers (AEROCHAMBER MV) inhaler Use with inhaler 1 each 0   thyroid (ARMOUR) 120 MG tablet Take by mouth.     vitamin B-12 (CYANOCOBALAMIN) 500 MCG tablet Take 500 mcg by mouth daily.     No current facility-administered medications for this visit.    REVIEW OF SYSTEMS:    10 Point review of Systems was done is negative except as noted above.  PHYSICAL  EXAMINATION: ECOG PERFORMANCE STATUS: 1 - Symptomatic but completely ambulatory  . Vitals:   11/04/22 1100  BP: (!) 144/83  Pulse: 82  Resp: 17  Temp: 97.7 F (36.5 C)  SpO2: 98%   GENERAL:alert, in no acute distress and comfortable SKIN: no acute rashes, no significant lesions EYES: conjunctiva are pink and non-injected, sclera anicteric OROPHARYNX: MMM, no exudates, no oropharyngeal erythema or ulceration NECK: supple, no JVD LYMPH:  no palpable lymphadenopathy in the cervical, axillary or inguinal regions LUNGS: clear to auscultation b/l with normal respiratory effort HEART: regular rate & rhythm ABDOMEN:  normoactive bowel sounds , non tender, not distended. Extremity: no pedal edema PSYCH: alert & oriented x 3 with fluent speech NEURO: no focal motor/sensory deficits  LABORATORY DATA:  I have reviewed the data as listed .    Latest Ref Rng & Units 11/07/2022    2:10 PM 10/13/2022    4:57 PM 01/23/2020   12:02 PM  CBC  WBC 4.0 - 10.5 K/uL 9.9  10.2  7.7   Hemoglobin 12.0 - 15.0 g/dL 13.4  13.2  12.7   Hematocrit 36.0 - 46.0 % 39.6  39.9  39.5   Platelets 150 - 400 K/uL 289  308  332    .    Latest Ref Rng & Units 11/07/2022    2:10 PM 10/13/2022    4:57 PM 01/23/2020   12:02 PM  CMP  Glucose 70 - 99 mg/dL 99  93  105   BUN 6 - 20 mg/dL '8  8  12   '$ Creatinine 0.44 - 1.00 mg/dL 0.79  0.89  0.87   Sodium 135 - 145 mmol/L 139  140  140   Potassium 3.5 - 5.1 mmol/L 3.9  4.1  4.2   Chloride 98 - 111 mmol/L 103  103  99   CO2 22 - 32 mmol/L '29  28  20   '$ Calcium 8.9 - 10.3 mg/dL 9.1  9.2  9.3   Total Protein 6.5 - 8.1 g/dL 7.4   7.1   Total Bilirubin 0.3 - 1.2 mg/dL 0.8   0.2   Alkaline Phos 38 - 126 U/L 60   88   AST 15 - 41 U/L 18   24   ALT 0 - 44 U/L 14   19      RADIOGRAPHIC STUDIES: I have personally reviewed the radiological images as listed and agreed with the findings in the report. US Venous Img Upper Uni Left (DVT)  Result Date: 10/25/2022 CLINICAL DATA:   Follow-up left basilic vein thrombus, currently on anticoagulation. Evaluate for acute or chronic DVT/SVT. EXAM: LEFT UPPER EXTREMITY VENOUS DOPPLER ULTRASOUND TECHNIQUE: Gray-scale sonography with graded compression, as well as color Doppler and duplex ultrasound were performed to evaluate the upper extremity deep venous system from the level of the subclavian vein and including the jugular, axillary, basilic, radial, ulnar and upper cephalic vein. Spectral Doppler was utilized to evaluate flow at rest and with distal augmentation maneuvers. COMPARISON:  Left upper extremity venous Doppler ultrasound-10/13/2022 (positive for occlusive superficial thrombophlebitis involving the left basilic vein). FINDINGS: Contralateral Subclavian Vein: Respiratory phasicity is normal and symmetric with the symptomatic side. No evidence of thrombus. Normal compressibility. Internal Jugular Vein: No evidence of thrombus. Normal compressibility, respiratory phasicity and response to augmentation. Subclavian Vein: No evidence of thrombus. Normal compressibility, respiratory phasicity and response to augmentation. Axillary Vein: No evidence of thrombus. Normal compressibility, respiratory phasicity and response to augmentation. Cephalic Vein: No evidence of thrombus. Normal compressibility, respiratory phasicity and response to augmentation. Basilic Vein: Grossly unchanged occlusive thrombus involving the left basilic vein (images 19 through 22), similar to the 10/13/2022 examination. Brachial Veins: No evidence of thrombus. Normal compressibility, respiratory phasicity and response to augmentation. Radial Veins: No evidence of thrombus. Normal compressibility, respiratory phasicity and response to augmentation. Ulnar Veins: No evidence of thrombus. Normal compressibility, respiratory phasicity and response to augmentation. Other Findings:  None visualized. IMPRESSION: 1. No evidence of acute or chronic DVT within the left upper  extremity. 2. Unchanged occlusive superficial thrombophlebitis involving the left basilic vein without evidence of central propagation. Electronically Signed   By: Sandi Mariscal M.D.   On: 10/25/2022 15:34   US Venous Img Upper Uni Left  Result Date: 10/13/2022 CLINICAL DATA:  Left upper extremity pain and swelling. Family history of factor 5 Leiden mutation. EXAM: LEFT UPPER EXTREMITY VENOUS DOPPLER ULTRASOUND TECHNIQUE: Gray-scale sonography with graded compression, as well as color Doppler and duplex ultrasound were performed to evaluate the upper extremity deep venous system from the level of the subclavian vein and including the jugular, axillary, basilic, radial, ulnar and upper cephalic vein. Spectral Doppler was utilized to evaluate flow at rest and with distal augmentation maneuvers. COMPARISON:  None Available. FINDINGS: Contralateral Subclavian Vein: Respiratory phasicity is normal and symmetric with the symptomatic side. No evidence of thrombus. Normal compressibility. Internal Jugular Vein: No evidence of thrombus. Normal compressibility, respiratory phasicity and response to augmentation. Subclavian Vein: No evidence of thrombus. Normal compressibility, respiratory phasicity and response to augmentation. Axillary Vein: No evidence of thrombus. Normal compressibility, respiratory phasicity and response to augmentation. Cephalic Vein: No evidence of thrombus. Normal compressibility, respiratory phasicity and response to augmentation. Basilic Vein: There is hypoechoic occlusive thrombus involving the left basilic vein (image 15 through 17; images 19 through 21). Brachial  Veins: No evidence of thrombus. Normal compressibility, respiratory phasicity and response to augmentation. Radial Veins: No evidence of thrombus. Normal compressibility, respiratory phasicity and response to augmentation. Ulnar Veins: No evidence of thrombus. Normal compressibility, respiratory phasicity and response to augmentation.  Other Findings:  None visualized. IMPRESSION: 1. No evidence of DVT within the left upper extremity. 2. The examination is positive for occlusive superficial thrombophlebitis involving the left basilic vein. Electronically Signed   By: Sandi Mariscal M.D.   On: 10/13/2022 17:00    ASSESSMENT & PLAN:   Wonderful 52 y/o female with:  Left upper extremity basilic vein superficial venous thrombophlebitis. Previous PICC level. FHx of factor V Leiden mutation  PLAN:  -Educated patient that if her superficial clot is minor, warm compresses and Aspirin would typically resolve issues -Recommend more advanced testing if superficial clots are more concerning or if patient has strong family history -Discussed that if heterozygous, risk of clots is fairly variable: Risk is 4-7 fold. -Educated pt on Factor V Leiden clotting factor in great detail including role of protein C -Informed patient that heart attacks and strokes are primary arterial events, exception is if there is a hole in the heart.  -Educated patient that factor V Leiden usually causes venous blood clots -discussed that pulmonary fibrosis is an independent risk factor for blood clots -Recommended patient to avoid excessive IV lines as local IV lines would be a risk factor for blood clots. Informed patient that tight clothing could also constrict the flow of blood -discussed option of full arm compression sleeve to improve swelling.  -Recommended patient to keep arms elevated especially when sleeping to improve swelling -Recommend topical Voltaran gel to improve arm swelling -Recommend avoid excessive caffeine, walk frequently, move around, compression socks -Patient's two clots have neither progressed or regressed at this time -no concern for significant clotting disorder at this time -entire panel not necessary at this time, though pt would like to proceed as such -Recommend PCP to evaluate headache -Educated patient that superficial vein  clots do not typically move to the lungs -Recommend patient to receive her age appropriate cancer screening.  -no major benefit from Eliquis at this time. If symptoms bothersome and persistent, discussed that there may be an additional role of starting blood thinners -discussed option to order Xarelto or Eliquis if patient would like -Recommend patient to take Aspirin for a few days before travel to further reduce risk of blood clots -continue 162 MG Aspirin with food for 4-6 weeks -Advised patient to take precautions to reduce any risk of blood clots while traveling -Discussed that risk of blood clots increases with estrogen exposure after age 8 years.  -answered all of patient questions  -educated patient on venous stenosis -hypercoag testing sent based on patients preference and fhx of VTE and FVL mutation . Orders Placed This Encounter  Procedures   CBC with Differential ([place] Only)    Standing Status:   Future    Number of Occurrences:   1    Standing Expiration Date:   11/07/2023   CMP ([place] only)    Standing Status:   Future    Number of Occurrences:   1    Standing Expiration Date:   11/07/2023   Antithrombin III    Standing Status:   Future    Number of Occurrences:   1    Standing Expiration Date:   11/07/2023   Protein C activity    Standing Status:   Future  Number of Occurrences:   1    Standing Expiration Date:   11/07/2023   Protein C, total    Standing Status:   Future    Number of Occurrences:   1    Standing Expiration Date:   11/07/2023   Protein S activity    Standing Status:   Future    Number of Occurrences:   1    Standing Expiration Date:   11/07/2023   Protein S, total    Standing Status:   Future    Number of Occurrences:   1    Standing Expiration Date:   11/07/2023   Lupus anticoagulant panel    Standing Status:   Future    Number of Occurrences:   1    Standing Expiration Date:   11/07/2023   Beta-2-glycoprotein i abs, IgG/M/A     Standing Status:   Future    Number of Occurrences:   1    Standing Expiration Date:   11/07/2023   Homocysteine, serum    Standing Status:   Future    Number of Occurrences:   1    Standing Expiration Date:   11/07/2023   Factor 5 leiden    Standing Status:   Future    Number of Occurrences:   1    Standing Expiration Date:   11/07/2023   Prothrombin gene mutation    Standing Status:   Future    Number of Occurrences:   1    Standing Expiration Date:   11/07/2023   Cardiolipin antibodies, IgG, IgM, IgA    Standing Status:   Future    Number of Occurrences:   1    Standing Expiration Date:   11/07/2023    FOLLOW-UP: Labs on Monday 11/05/2021 Phone visit with Dr Irene Limbo in 2 weeks  The total time spent in the appointment was 45 minutes* .  All of the patient's questions were answered with apparent satisfaction. The patient knows to call the clinic with any problems, questions or concerns.   Sullivan Lone MD MS AAHIVMS [facility] Hematology/Oncology Physician [facility]  .*Total Encounter Time as defined by the Centers for Medicare and Medicaid Services includes, in addition to the face-to-face time of a patient visit (documented in the note above) non-face-to-face time: obtaining and reviewing outside history, ordering and reviewing medications, tests or procedures, care coordination (communications with other health care professionals or caregivers) and documentation in the medical record.   I,Mitra Faeizi,acting as a Education administrator for Sullivan Lone, MD.,have documented all relevant documentation on the behalf of Sullivan Lone, MD,as directed by  Sullivan Lone, MD while in the presence of Sullivan Lone, MD.  .I have reviewed the above documentation for accuracy and completeness, and I agree with the above. Brunetta Genera MD

## 2022-11-04 ENCOUNTER — Inpatient Hospital Stay: Payer: Commercial Managed Care - HMO | Attending: Hematology | Admitting: Hematology

## 2022-11-04 VITALS — BP 144/83 | HR 82 | Temp 97.7°F | Resp 17

## 2022-11-04 DIAGNOSIS — I808 Phlebitis and thrombophlebitis of other sites: Secondary | ICD-10-CM | POA: Insufficient documentation

## 2022-11-04 DIAGNOSIS — R2232 Localized swelling, mass and lump, left upper limb: Secondary | ICD-10-CM

## 2022-11-04 DIAGNOSIS — D6859 Other primary thrombophilia: Secondary | ICD-10-CM

## 2022-11-04 DIAGNOSIS — Z832 Family history of diseases of the blood and blood-forming organs and certain disorders involving the immune mechanism: Secondary | ICD-10-CM | POA: Insufficient documentation

## 2022-11-06 ENCOUNTER — Telehealth: Payer: Self-pay

## 2022-11-06 NOTE — Telephone Encounter (Signed)
Patient was informed. She wanted Dr. Marguerita Merles to know she was on her Jun[place] bcps right up to the day before her [facility] level was drawn. She asked if that could have affected result.

## 2022-11-06 NOTE — Telephone Encounter (Signed)
-----   Message from Princess Bruins, MD sent at 10/23/2022  9:37 AM EST ----- FSH 7.8, not menopausal.

## 2022-11-07 ENCOUNTER — Other Ambulatory Visit: Payer: Self-pay

## 2022-11-07 ENCOUNTER — Inpatient Hospital Stay: Payer: Commercial Managed Care - HMO

## 2022-11-07 DIAGNOSIS — D6859 Other primary thrombophilia: Secondary | ICD-10-CM

## 2022-11-07 DIAGNOSIS — Z832 Family history of diseases of the blood and blood-forming organs and certain disorders involving the immune mechanism: Secondary | ICD-10-CM

## 2022-11-07 DIAGNOSIS — I808 Phlebitis and thrombophlebitis of other sites: Secondary | ICD-10-CM | POA: Diagnosis not present

## 2022-11-07 LAB — CBC WITH DIFFERENTIAL (CANCER CENTER ONLY)
Abs Immature Granulocytes: 0.04 10*3/uL (ref 0.00–0.07)
Basophils Absolute: 0 10*3/uL (ref 0.0–0.1)
Basophils Relative: 0 %
Eosinophils Absolute: 0.4 10*3/uL (ref 0.0–0.5)
Eosinophils Relative: 4 %
HCT: 39.6 % (ref 36.0–46.0)
Hemoglobin: 13.4 g/dL (ref 12.0–15.0)
Immature Granulocytes: 0 %
Lymphocytes Relative: 27 %
Lymphs Abs: 2.7 10*3/uL (ref 0.7–4.0)
MCH: 28.9 pg (ref 26.0–34.0)
MCHC: 33.8 g/dL (ref 30.0–36.0)
MCV: 85.5 fL (ref 80.0–100.0)
Monocytes Absolute: 0.5 10*3/uL (ref 0.1–1.0)
Monocytes Relative: 5 %
Neutro Abs: 6.3 10*3/uL (ref 1.7–7.7)
Neutrophils Relative %: 64 %
Platelet Count: 289 10*3/uL (ref 150–400)
RBC: 4.63 MIL/uL (ref 3.87–5.11)
RDW: 12.3 % (ref 11.5–15.5)
WBC Count: 9.9 10*3/uL (ref 4.0–10.5)
nRBC: 0 % (ref 0.0–0.2)

## 2022-11-07 LAB — CMP (CANCER CENTER ONLY)
ALT: 14 U/L (ref 0–44)
AST: 18 U/L (ref 15–41)
Albumin: 4.1 g/dL (ref 3.5–5.0)
Alkaline Phosphatase: 60 U/L (ref 38–126)
Anion gap: 7 (ref 5–15)
BUN: 8 mg/dL (ref 6–20)
CO2: 29 mmol/L (ref 22–32)
Calcium: 9.1 mg/dL (ref 8.9–10.3)
Chloride: 103 mmol/L (ref 98–111)
Creatinine: 0.79 mg/dL (ref 0.44–1.00)
GFR, Estimated: >60 mL/min
Glucose, Bld: 99 mg/dL (ref 70–99)
Potassium: 3.9 mmol/L (ref 3.5–5.1)
Sodium: 139 mmol/L (ref 135–145)
Total Bilirubin: 0.8 mg/dL (ref 0.3–1.2)
Total Protein: 7.4 g/dL (ref 6.5–8.1)

## 2022-11-07 LAB — ANTITHROMBIN III: AntiThromb III Func: 97 % (ref 75–120)

## 2022-11-08 ENCOUNTER — Other Ambulatory Visit: Payer: Self-pay

## 2022-11-08 DIAGNOSIS — N951 Menopausal and female climacteric states: Secondary | ICD-10-CM

## 2022-11-08 LAB — HOMOCYSTEINE: Homocysteine: 9.5 umol/L (ref 0.0–14.5)

## 2022-11-08 NOTE — Telephone Encounter (Signed)
Beth Bruins, MD  You2 days ago   She can repeat [facility] after 1 week off the BCPs. Dr Carlean Jews

## 2022-11-08 NOTE — Telephone Encounter (Signed)
Called and spoke with patient. She has been off her pills and by Friday it will be a little more than 7 days. Lab appt scheduled for 11/10/22 and order placedl.

## 2022-11-09 LAB — PTT-LA MIX: PTT-LA Mix: 39.2 s (ref 0.0–40.5)

## 2022-11-09 LAB — BETA-2-GLYCOPROTEIN I ABS, IGG/M/A
Beta-2 Glyco I IgG: <9 GPI IgG units (ref 0–20)
Beta-2-Glycoprotein I IgA: <9 GPI IgA units (ref 0–25)
Beta-2-Glycoprotein I IgM: <9 GPI IgM units (ref 0–32)

## 2022-11-09 LAB — CARDIOLIPIN ANTIBODIES, IGG, IGM, IGA
Anticardiolipin IgA: <9 APL U/mL (ref 0–11)
Anticardiolipin IgG: <9 GPL U/mL (ref 0–14)
Anticardiolipin IgM: <9 MPL U/mL (ref 0–12)

## 2022-11-09 LAB — PROTEIN C, TOTAL: Protein C, Total: 124 % (ref 60–150)

## 2022-11-09 LAB — LUPUS ANTICOAGULANT PANEL
DRVVT: 41.7 s (ref 0.0–47.0)
PTT Lupus Anticoagulant: 45.7 s — ABNORMAL HIGH (ref 0.0–43.5)

## 2022-11-09 LAB — PROTEIN C ACTIVITY: Protein C Activity: 98 % (ref 73–180)

## 2022-11-09 LAB — PTT-LA INCUB MIX: PTT-LA Incub Mix: 39.3 s (ref 0.0–40.5)

## 2022-11-09 LAB — PROTEIN S ACTIVITY: Protein S Activity: 69 % (ref 63–140)

## 2022-11-09 LAB — PROTEIN S, TOTAL: Protein S Ag, Total: 62 % (ref 60–150)

## 2022-11-10 ENCOUNTER — Other Ambulatory Visit: Payer: Commercial Managed Care - HMO

## 2022-11-10 DIAGNOSIS — N951 Menopausal and female climacteric states: Secondary | ICD-10-CM

## 2022-11-11 LAB — FOLLICLE STIMULATING HORMONE: FSH: 4.6 m[IU]/mL

## 2022-11-14 ENCOUNTER — Inpatient Hospital Stay: Payer: Commercial Managed Care - HMO | Admitting: Hematology

## 2022-11-15 LAB — FACTOR 5 LEIDEN

## 2022-11-16 LAB — PROTHROMBIN GENE MUTATION

## 2022-11-21 ENCOUNTER — Inpatient Hospital Stay (HOSPITAL_BASED_OUTPATIENT_CLINIC_OR_DEPARTMENT_OTHER): Payer: Commercial Managed Care - HMO | Admitting: Hematology

## 2022-11-21 DIAGNOSIS — Z832 Family history of diseases of the blood and blood-forming organs and certain disorders involving the immune mechanism: Secondary | ICD-10-CM | POA: Diagnosis not present

## 2022-11-21 DIAGNOSIS — I82612 Acute embolism and thrombosis of superficial veins of left upper extremity: Secondary | ICD-10-CM | POA: Diagnosis not present

## 2022-11-21 DIAGNOSIS — I808 Phlebitis and thrombophlebitis of other sites: Secondary | ICD-10-CM | POA: Diagnosis not present

## 2022-11-21 NOTE — Progress Notes (Signed)
HEMATOLOGY/ONCOLOGY TELE-MED NOTE  Date of Service: 11/21/2022  Patient Care Team: Deland Pretty, MD as PCP - General (Internal Medicine) Croitoru, Dani Gobble, MD as PCP - Cardiology (Cardiology) [name], NP as Nurse Practitioner (Pulmonary Disease) Cottle, Billey Co., MD as Attending Physician (Psychiatry) Princess Bruins, MD as Consulting Physician (Obstetrics and Gynecology)  CHIEF COMPLAINTS/PURPOSE OF CONSULTATION:  Evaluation and management of Fhx of factor v leiden mutation  HISTORY OF PRESENTING ILLNESS:   [name] is a wonderful 52 y.o. female who has been referred to [place] by Deland Pretty, MD for evaluation and management of superficial venous clots.  Today, she is accompanied by her sister. Patient confirms that she has never had a DVT. Patient does however have superficial venous clots. Patient was previously tested for factor V leiden and reports that she tested negative, though she is not 100% sure. She also reports that she has severe endometriosis.  She does complain of a constant headache that began about two weeks ago. This has not been evaluated by her PCP. This may have been triggered by her hormonal medications.   In regards to Fhx, her mother has a hx of  Factor V Leiden, PE, and plenty of clots in her brain and lungs. She is unsure if it was Heterozygous or homozygous in origin. Her mother has also had issues with pregnancy and has had 6 bypass surgeries. She reports that her father has had vein circulation issues.   Patient reports that she has lyme diease. She initially received a tick bite in her upper left arm around 2006 but denies any blood clots. She noticed a large swollen knot in her upper left arm. She denies any injuries, IV lines, or blood drawn in that area.   Patient denies any previous issues with miscarriages, spine pain, abdominal pain, unexpected weight loss, fever, chills, or night sweats. She does complain of sudden spasms in her  arm. No swelling prior to her pain.  She has received two sigmoidoscopy previously, but has not had a recent mammogram or colonoscopy.  Her left arm swelling has improved by approximately 75% since it was significantly swollen. She has no otherwise swelling in her hand.  She has been on hormonal pills for about 30 years which did not trigger any blood clots. Patient had previously taken Eliquis for about a week.    INTERVAL HISTORY: [name] is a wonderful 52 y.o. female who is here for continued evaluation and management of superficial venous clots. Patient was last seen by me on 11/03/2022.  .I connected with Pershing Cox on 11/21/2022 at  3:30 PM EDT by telephone visit and verified that I am speaking with the correct person using two identifiers.  Patient notes she has been doing well overall since our last visit. She notes that she has discontinued her hormonal medication. She denies fever, chills, night sweats, infection issues, abdominal pain, chest pain, back pain, or leg swelling.   Patient notes that her left hand swelling has improved since our last visit. She has been taking aspirin for 4 weeks.    I discussed the limitations, risks, security and privacy concerns of performing an evaluation and management service by telemedicine and the availability of in-person appointments. I also discussed with the patient that there may be a patient responsible charge related to this service. The patient expressed understanding and agreed to proceed.   Other persons participating in the visit and their role in the encounter: None  Patient's location: Home  Provider's location: [facility]   Chief Complaint: superficial venous clots     MEDICAL HISTORY:  Past Medical History:  Diagnosis Date   Anxiety    Arrhythmia    heart races when pt in pain   Asthma    Clostridium difficile infection 2014   Epstein Barr virus infection    Fibromyalgia    GERD (gastroesophageal reflux  disease)    History of COVID-19    Hypertension    Hypothyroidism    Immune deficiency disorder ([place])    Interstitial cystitis    Lyme disease    Mold contact confirmed    Toxic Mold syndrome   Mononucleosis 01/2009   Ovarian cyst    Serous Cystadenofibroma-Left ovary   PONV (postoperative nausea and vomiting)    Shortness of breath    on exertion   Systolic murmur    Thrombophlebitis    Thyroid disease     SURGICAL HISTORY: Past Surgical History:  Procedure Laterality Date   BLADDER SURGERY     CHOLECYSTECTOMY     FECAL TRANSPLANT     Groin cyst     LAPAROSCOPY Right 06/12/2013   Procedure: LAPAROSCOPY OPERATIVE  fulgeration of endometriosis, right salpingooophorectomy;  Surgeon: Terrance Mass, MD;  Location: [facility];  Service: Gynecology;  Laterality: Right;   Nose cyst     OOPHORECTOMY Right 06/12/2013   Procedure: OOPHORECTOMY;POSS RIGHT [place];  Surgeon: Terrance Mass, MD;  Location: [facility];  Service: Gynecology;  Laterality: Right;   PELVIC LAPAROSCOPY     DL Ovarian cystectomy-Left   TONSILLECTOMY  2009    SOCIAL HISTORY: Social History   Socioeconomic History   Marital status: Single    Spouse name: Not on file   Number of children: 0   Years of education: college   Highest education level: Not on file  Occupational History   Occupation: Teacher  Tobacco Use   Smoking status: Never   Smokeless tobacco: Never  Vaping Use   Vaping Use: Never used  Substance and Sexual Activity   Alcohol use: No    Alcohol/week: 0.0 standard drinks of alcohol   Drug use: No   Sexual activity: Not Currently    Birth control/protection: Abstinence  Other Topics Concern   Not on file  Social History Narrative   Lives at home alone.   Right-handed.   Caffeine use: 1 cup per day.   Social Determinants of Health   Financial Resource Strain: Not on file  Food Insecurity: No Food Insecurity (11/04/2022)   Hunger Vital Sign    Worried About Running Out  of Food in the Last Year: Never true    Ran Out of Food in the Last Year: Never true  Transportation Needs: No Transportation Needs (11/04/2022)   PRAPARE - Hydrologist (Medical): No    Lack of Transportation (Non-Medical): No  Physical Activity: Not on file  Stress: Not on file  Social Connections: Not on file  Intimate Partner Violence: Not At Risk (11/04/2022)   Humiliation, Afraid, Rape, and Kick questionnaire    Fear of Current or Ex-Partner: No    Emotionally Abused: No    Physically Abused: No    Sexually Abused: No    FAMILY HISTORY: Family History  Problem Relation Age of Onset   Rheum arthritis Mother    Hypertension Mother    Diabetes Mother    Pulmonary embolism Mother  x2   Heart disease Mother    Other Mother        factor 5   Hypertension Father    Thyroid disease Father    Breast cancer Maternal Aunt        Age 43   Pancreatic cancer Maternal Grandmother    Heart disease Maternal Grandfather    Breast cancer Paternal Grandmother        Age 63   Asthma Paternal Grandmother     ALLERGIES:  is allergic to [employer] hcl], fexofenadine, oseltamivir, tamiflu [oseltamivir phosphate], vancomycin, diphenhydramine, latex, sulfamethoxazole, aspirin, doxycycline, other, diphenhydramine hcl, morphine, oseltamivir phosphate, penicillins, and sulfonamide derivatives.  MEDICATIONS:  Current Outpatient Medications  Medication Sig Dispense Refill   acetaminophen (TYLENOL) 650 MG CR tablet Take by mouth.     albuterol (PROAIR HFA) 108 (90 Base) MCG/ACT inhaler Inhale 2 puffs into the lungs every 6 (six) hours as needed for wheezing or shortness of breath. 18 g 3   ALPRAZolam (XANAX XR) 0.5 MG 24 hr tablet Take 0.5 mg by mouth in the morning and at bedtime. BID     apixaban (ELIQUIS) 5 MG TABS tablet Take 5 mg by mouth 2 (two) times daily. (Patient not taking: Reported on 11/04/2022)     aspirin EC 81 MG tablet Take 81 mg by mouth  daily. Swallow whole. Take 2 daily     baclofen (LIORESAL) 20 MG tablet Take 10 mg by mouth as needed.     bisoprolol (ZEBETA) 10 MG tablet Take 10 mg by mouth daily.      buprenorphine (BUTRANS) 15 MCG/HR Place onto the skin once a week.     cetirizine (ZYRTEC) 10 MG tablet Take 10 mg by mouth daily.      Cholecalciferol (VITAMIN D3 PO) Take 5,000 Int'l Units by mouth daily.     citalopram (CELEXA) 40 MG tablet Take 40 mg by mouth daily.     cloNIDine (CATAPRES) 0.1 MG tablet TAKE 1/2 TABLET BY MOUTH IN THE MORNING AND 1 TAB AT BEDTIME 135 tablet 1   fluticasone (FLONASE) 50 MCG/ACT nasal spray      gabapentin (NEURONTIN) 600 MG tablet Take 1 tablet (600 mg total) by mouth in the morning, at noon, in the evening, and at bedtime. 120 tablet 0   hydrOXYzine (ATARAX/VISTARIL) 25 MG tablet Take 25 mg by mouth as needed for anxiety.      Hyoscyamine Sulfate SL 0.125 MG SUBL Take 0.125 mg by mouth every 4 (four) hours as needed for cramping.     Melatonin 10 MG TABS Take 5 mg by mouth at bedtime.     Multiple Vitamin (MULTI-VITAMIN) tablet Take by mouth.     Probiotic Product (ALIGN PO) Take 1 capsule by mouth as needed.     prochlorperazine (COMPAZINE) 10 MG tablet Take 10 mg by mouth every 6 (six) hours as needed for nausea or vomiting.      promethazine (PHENERGAN) 25 MG tablet Take 25 mg by mouth every 6 (six) hours as needed for nausea or vomiting.      rOPINIRole (REQUIP) 2 MG tablet Take 2 mg by mouth at bedtime.      Spacer/Aero-Holding Chambers (AEROCHAMBER MV) inhaler Use with inhaler 1 each 0   thyroid (ARMOUR) 120 MG tablet Take by mouth.     vitamin B-12 (CYANOCOBALAMIN) 500 MCG tablet Take 500 mcg by mouth daily.     No current facility-administered medications for this visit.    REVIEW OF SYSTEMS:  10 Point review of Systems was done is negative except as noted above.  PHYSICAL EXAMINATION: TELE-MED VISIT  LABORATORY DATA:  I have reviewed the data as listed .    Latest  Ref Rng & Units 11/07/2022    2:10 PM 10/13/2022    4:57 PM 01/23/2020   12:02 PM  CBC  WBC 4.0 - 10.5 K/uL 9.9  10.2  7.7   Hemoglobin 12.0 - 15.0 g/dL 13.4  13.2  12.7   Hematocrit 36.0 - 46.0 % 39.6  39.9  39.5   Platelets 150 - 400 K/uL 289  308  332    .    Latest Ref Rng & Units 11/07/2022    2:10 PM 10/13/2022    4:57 PM 01/23/2020   12:02 PM  CMP  Glucose 70 - 99 mg/dL 99  93  105   BUN 6 - 20 mg/dL 8  8  12    Creatinine 0.44 - 1.00 mg/dL 0.79  0.89  0.87   Sodium 135 - 145 mmol/L 139  140  140   Potassium 3.5 - 5.1 mmol/L 3.9  4.1  4.2   Chloride 98 - 111 mmol/L 103  103  99   CO2 22 - 32 mmol/L 29  28  20    Calcium 8.9 - 10.3 mg/dL 9.1  9.2  9.3   Total Protein 6.5 - 8.1 g/dL 7.4   7.1   Total Bilirubin 0.3 - 1.2 mg/dL 0.8   0.2   Alkaline Phos 38 - 126 U/L 60   88   AST 15 - 41 U/L 18   24   ALT 0 - 44 U/L 14   19       important suggestion  View Detailed Reports    important suggestion  View Condensed Results Report   PTT-LA Incub Mix Order: [id number] Collected 11/07/2022 14:10   0 Result Notes    Component Ref Range & Units 2 wk ago  PTT-LA Incub Mix 0.0 - 40.5 sec 39.3  Comment: (NOTE) Performed At: [facility] [place], [place] [id number] Rush Farmer MD [id number]     View Full Report     Specimen Collected: 11/07/22 14:10 Last Resulted: 11/09/22 13:37         Result Care Coordination   Patient Communication   Add Comments   Seen Back to Top       PTT-LA Mix Order: [id number] Collected 11/07/2022 14:10   0 Result Notes    Component Ref Range & Units 2 wk ago  PTT-LA Mix 0.0 - 40.5 sec 39.2  Comment: (NOTE) Performed At: [facility] [place], [place] [id number] Rush Farmer MD [id number]     View Full Report     Specimen Collected: 11/07/22 14:10 Last Resulted: 11/09/22 13:37         Result Care Coordination   Patient Communication   Add Comments   Seen Back to Top        CBC with Differential ([place] Only) Order: [id number] Collected 11/07/2022 14:10   0 Result Notes    Component Ref Range & Units 2 wk ago  WBC Count 4.0 - 10.5 K/uL 9.9  RBC 3.87 - 5.11 MIL/uL 4.63  Hemoglobin 12.0 - 15.0 g/dL 13.4  HCT 36.0 - 46.0 % 39.6  MCV 80.0 - 100.0 fL 85.5  MCH 26.0 - 34.0 pg 28.9  MCHC 30.0 - 36.0 g/dL 33.8  RDW 11.5 - 15.5 % 12.3  Platelet  Count 150 - 400 K/uL 289  nRBC 0.0 - 0.2 % 0.0  Neutrophils Relative % % 64  Neutro Abs 1.7 - 7.7 K/uL 6.3  Lymphocytes Relative % 27  Lymphs Abs 0.7 - 4.0 K/uL 2.7  Monocytes Relative % 5  Monocytes Absolute 0.1 - 1.0 K/uL 0.5  Eosinophils Relative % 4  Eosinophils Absolute 0.0 - 0.5 K/uL 0.4  Basophils Relative % 0  Basophils Absolute 0.0 - 0.1 K/uL 0.0  Immature Granulocytes % 0  Abs Immature Granulocytes 0.00 - 0.07 K/uL 0.04  Comment: Performed at [facility], [place] [address] [place], [place] [address]     View Full Report     Specimen Collected: 11/07/22 14:10 Last Resulted: 11/07/22 14:42         Result Care Coordination   Patient Communication   Add Comments   Seen Back to Top       CMP ([place] only) Order: [id number] Collected 11/07/2022 14:10   0 Result Notes    Component Ref Range & Units 2 wk ago  Sodium 135 - 145 mmol/L 139  Potassium 3.5 - 5.1 mmol/L 3.9  Chloride 98 - 111 mmol/L 103  CO2 22 - 32 mmol/L 29  Glucose, Bld 70 - 99 mg/dL 99  Comment: Glucose reference range applies only to samples taken after fasting for at least 8 hours.  BUN 6 - 20 mg/dL 8  Creatinine 0.44 - 1.00 mg/dL 0.79  Calcium 8.9 - 10.3 mg/dL 9.1  Total Protein 6.5 - 8.1 g/dL 7.4  Albumin 3.5 - 5.0 g/dL 4.1  AST 15 - 41 U/L 18  ALT 0 - 44 U/L 14  Alkaline Phosphatase 38 - 126 U/L 60  Total Bilirubin 0.3 - 1.2 mg/dL 0.8  GFR, Estimated >60 mL/min >60  Comment: (NOTE) Calculated using the CKD-EPI Creatinine Equation (2021)   Anion gap 5 - 15 7  Comment: Performed at [facility], [place] [address], [place], [place] [address]     View Full Report     Specimen Collected: 11/07/22 14:10 Last Resulted: 11/07/22 15:35         Result Care Coordination   Patient Communication   Add Comments   Seen Back to Top       Antithrombin III Order: [id number] Collected 11/07/2022 14:10   0 Result Notes    Component Ref Range & Units 2 wk ago  AntiThromb III Func 75 - 120 % 97  Comment: Performed at [facility], [place] [address] [place], [place] [address]     View Full Report     Specimen Collected: 11/07/22 14:10 Last Resulted: 11/07/22 18:31         Result Care Coordination   Patient Communication   Add Comments   Seen Back to Top       Protein C activity Order: [id number] Collected 11/07/2022 14:10   0 Result Notes    Component Ref Range & Units 2 wk ago  Protein C Activity 73 - 180 % 98  Comment: (NOTE) Performed At: [facility] [place], [place] [id number] Rush Farmer MD [id number]     View Full Report     Specimen Collected: 11/07/22 14:10 Last Resulted: 11/09/22 06:37         Result Care Coordination   Patient Communication   Add Comments   Seen Back to Top       Protein C, total Order: [id number] Collected 11/07/2022 14:10  0 Result Notes    Component Ref Range & Units 2 wk ago  Protein C, Total 60 - 150 % 124  Comment: (NOTE) Performed At: [facility] [place], [place] [id number] Rush Farmer MD [id number]     View Full Report     Specimen Collected: 11/07/22 14:10 Last Resulted: 11/09/22 07:39         Result Care Coordination   Patient Communication   Add Comments   Seen Back to Top       Protein S activity Order: [id number] Collected 11/07/2022 14:10   0 Result Notes    Component Ref Range & Units 2 wk ago  Protein S Activity 63 - 140 % 69  Comment:  (NOTE) Protein S activity may be falsely increased (masking an abnormal, low result) in patients receiving direct Xa inhibitor (e.g., rivaroxaban, apixaban, edoxaban) or a direct thrombin inhibitor (e.g., dabigatran) anticoagulant treatment due to assay interference by these drugs. Performed At: [facility] [place], [place] [id number] Rush Farmer MD [id number]     View Full Report     Specimen Collected: 11/07/22 14:10 Last Resulted: 11/09/22 06:37         Result Care Coordination   Patient Communication   Add Comments   Seen Back to Top       Protein S, total Order: [id number] Collected 11/07/2022 14:10   0 Result Notes    Component Ref Range & Units 2 wk ago  Protein S Ag, Total 60 - 150 % 62  Comment: (NOTE) This test was developed and its performance characteristics determined by [facility]. It has not been cleared or approved by the Food and Drug Administration. Performed At: [facility] [place], [place] [id number] Rush Farmer MD [id number]     View Full Report     Specimen Collected: 11/07/22 14:10 Last Resulted: 11/09/22 06:37         Result Care Coordination   Patient Communication   Add Comments   Seen Back to Top        Contains abnormal data Lupus anticoagulant panel Order: [id number] Collected 11/07/2022 14:10     Status: Edited Result - FINAL   0 Result Notes    Component Ref Range & Units 2 wk ago  PTT Lupus Anticoagulant 0.0 - 43.5 sec 45.7 High   DRVVT 0.0 - 47.0 sec 41.7  Lupus Anticoag Interp Comment: VC  Comment: (NOTE) No lupus anticoagulant was detected. These results are consistent with a deficiency of one or more intrinsic pathway factors.  Since the dRVVT was within normal limits, the factors in question are VIII, IX, XI, XII and the contact factors.  It should be noted that mixing studies performed on samples with minimally extended aPTT results can be  equivocal.  Normal plasma can overcome weak inhibitors, also resulting in a correction of the mixing study. Performed At: [facility] [place], [place] [id number] Rush Farmer MD [id number]     View Full Report     Specimen Collected: 11/07/22 14:10 Last Resulted: 11/09/22 13:36       VC=Value has a corrected status      Result Care Coordination   Patient Communication   Add Comments   Seen Back to Top       Beta-2-glycoprotein i abs, IgG/M/A Order: [id number] Collected 11/07/2022 14:10   0 Result Notes    Component Ref Range & Units 2 wk ago  Beta-2 Glyco I IgG  0 - 20 GPI IgG units <9  Comment: (NOTE) The reference interval reflects a 3SD or 99th percentile interval, which is thought to represent a potentially clinically significant result in accordance with the International Consensus Statement on the classification criteria for definitive antiphospholipid syndrome (APS). J Thromb Haem 2006;4:295-306.  Beta-2-Glycoprotein I IgM 0 - 32 GPI IgM units <9  Comment: (NOTE) The reference interval reflects a 3SD or 99th percentile interval, which is thought to represent a potentially clinically significant result in accordance with the International Consensus Statement on the classification criteria for definitive antiphospholipid syndrome (APS). J Thromb Haem 2006;4:295-306. Performed At: [facility] [place], [place] [id number] Rush Farmer MD [id number]  Beta-2-Glycoprotein I IgA 0 - 25 GPI IgA units <9  Comment: (NOTE) The reference interval reflects a 3SD or 99th percentile interval, which is thought to represent a potentially clinically significant result in accordance with the International Consensus Statement on the classification criteria for definitive antiphospholipid syndrome (APS). J Thromb Haem 2006;4:295-306.     View Full Report     Specimen Collected: 11/07/22 14:10 Last Resulted: 11/09/22  06:37         Result Care Coordination   Patient Communication   Add Comments   Seen Back to Top       Factor 5 leiden Order: [id number] Collected 11/07/2022 14:10   0 Result Notes    Component 2 wk ago  Recommendations-F5LEID: Comment  Comment: (NOTE) Result: c.1601G>A (p.Arg534Gln) - Not Detected This result is not associated with an increased risk for venous thromboembolism. See Additional Clinical Information and Comments. Additional Clinical Information: Venous thromboembolism is a multifactorial disease influenced by genetic, environmental, and circumstantial risk factors. The c.1601G>A (p. Arg534Gln) variant in the F5 gene, commonly referred to as Factor V Leiden, is a genetic risk factor for venous thromboembolism. Heterozygous carriers of this variant have a 6- to 8- fold increased risk for venous thromboembolism. Individuals homozygous for this variant (ie, with a copy of the variant on each chromosome) have an approximately 80-fold increased risk for venous thromboembolism. Individuals who carry both a c.*97G>A variant in the F2 gene and Factor V Leiden have an approximately 20-fold increased risk for venous thromboembolism. Risks are likely to be even higher in more complex genotype combinations involving the F2 c.*97G>A variant and Factor V Leiden (PMID: [id number]). Additional risk factors include but are not limited to: deficiency of protein C, protein S, or antithrombin III, age, female sex, personal or family history of deep vein thromboembolism, smoking, surgery, prolonged immobilization, malignant neoplasm, tamoxifen treatment, raloxifene treatment, oral contraceptive use, hormone replacement therapy, and pregnancy. Management of thrombotic risk and thrombotic events should follow established guidelines and fit the clinical circumstance. This result cannot predict the occurrence or recurrence of a thrombotic event. Comment: Genetic counseling is  recommended to discuss the potential clinical implications of positive results, as well as recommendations for testing family members. Genetic Coordinators are available for health care providers to discuss results at [phone]). Test Details: Variant Analyzed: c.1601G>A (p. Arg534Gln), referred to as Factor V Leiden Methods/Limitations: DNA analysis of the F5 gene (NM_000130.5) was performed by PCR amplification followed by restriction enzyme analysis. The diagnostic sensitivity is >99%. Results must be combined with clinical information for the most accurate interpretation. Molecular- based testing is highly accurate, but as in any laboratory test, diagnostic errors may occur. False positive or false negative results may occur for reasons that include genetic variants, blood transfusions, bone marrow transplantation, somatic or tissue-specific  mosaicism, mislabeled samples, or erroneous representation of family relationships. This test was developed and its performance characteristics determined by [facility]. It has not been cleared or approved by the Food and Drug Administration. References: [facility], [facility]; [facility] Professional Practice and Guidelines Committee. Addendum: [place] consensus statement on factor V Leiden mutation testing. Genet Med. 2021 Mar 5. doi: JK:7402453- 01108-x. PMID: [id number]. Kristopher Oppenheim. Factor V Leiden Thrombophilia. 1999 May 14 (Updated 2018 Jan 4). In: Tarri Glenn, Ardinger HH, Pagon RA, et al., editors. GeneReviews(R) (Internet). [address] ([place]): [employer] [place], [place]; 1993-2021. Available from: [email or web address] Terrilee Files, Carla Drape, Marin Shutter CS; [facility] Laboratory Quality Assurance Committee. Venous thromboembolism laboratory testing (factor V Leiden and factor II c.*97G>A), 2018 update: a technical standard  of the [place] ([facility]). Genet Med. 2018 Dec;20(12):1489-1498. doi: J397249. Epub 2018 Oct 5. PMID: [id number].  Reviewed By: Comment  Comment: (NOTE) Technical Component performed at [facility] Professional Component performed by: Earlean Polka, Ph.D., [facility] Director, Molecular Genetics [place] [address] Performed At: [facility] [address] [place], [place] [id number] Katina Degree MDPhD [id number]     View Full Report     Specimen Collected: 11/07/22 14:10 Last Resulted: 11/15/22 11:37         Result Care Coordination   Patient Communication   Add Comments   Seen Back to Top       Prothrombin gene mutation Order: [id number] Collected 11/07/2022 14:10   0 Result Notes    Component 2 wk ago  Recommendations-PTGENE: Comment  Comment: (NOTE) Result: c.*97G>A - Not Detected This result is not associated with an increased risk for venous thromboembolism. See Additional Clinical Information and Comments. Additional Clinical Information: Venous thromboembolism is a multifactorial disease influenced by genetic, environmental, and circumstantial risk factors. The c.*97G>A variant in the F2 gene is a genetic risk factor for venous thromboembolism. Heterozygous carriers have a 2- to 4-fold increased risk for venous thromboembolism. Homozygotes for the c.*97G>A variant are rare. The annual risk of VTE in homozygotes has been reported to be 1.1%/year. Individuals who carry both a c.*97G>A variant in the F2 gene and a c.1601G>A (p. Arg534Gln) variant in the F5 gene (commonly referred to as Factor V Leiden) have an approximately 20- fold increased risk for venous thromboembolism. Risks are likely to be even higher in more complex genotype combinations involving the F2 c.*97G>A variant and Factor V Leiden (PMID: [id number]). Additional risk factors include but are not limited to: deficiency of  protein C, protein S, or antithrombin III, age, female sex, personal or family history of deep vein thromboembolism, smoking, surgery, prolonged immobilization, malignant neoplasm, tamoxifen treatment, raloxifene treatment, oral contraceptive use, hormone replacement therapy, and pregnancy. Management of thrombotic risk and thrombotic events should follow established guidelines and fit the clinical circumstance. This result cannot predict the occurrence or recurrence of a thrombotic event. Comments: Genetic counseling is recommended to discuss the potential clinical implications of positive results, as well as recommendations for testing family members. Genetic Coordinators are available for health care providers to discuss results at [phone]). Test Details: Variant analyzed: c.*97G>A, previously referred to as G20210A Methods/Limitations: DNA analysis of the F2 gene (NM_000506.5) was performed by PCR amplification followed by restriction enzyme analysis. The diagnostic sensitivity is >99%. Results must be combined with clinical information for the most accurate interpretation. Molecular-based testing is highly accurate,  but as in any laboratory test, diagnostic errors may occur. False positive or false negative results may occur for reasons that include genetic variants, blood transfusions, bone marrow transplantation, somatic or tissue-specific mosaicism, mislabeled samples, or erroneous representation of family relationships. This test was developed and its performance characteristics determined by [facility]. It has not been cleared or approved by the Food and Drug Administration. References: [facility], [facility] [place] [facility]; [facility] Professional Practice and Guidelines Committee. Addendum: [place] consensus statement on factor V Leiden mutation testing. Genet Med. 2021 Mar 5. doi: 10.1038/s41436-021-01108-x. PMID:  BQ:6104235. Kristopher Oppenheim. Prothrombin Thrombophilia. 2006 Jul 25 [Updated 2021 Feb 4]. In: Tarri Glenn, Ardinger HH, Pagon RA, et al., editors. GeneReviews(R) [Internet]. [address] ([place]): [place], [place]; 1993-2021. Available from: [email or web address]-[email or web address]/ Terrilee Files, Carla Drape, Marin Shutter CS; [facility] Laboratory Quality Assurance Committee. Venous thromboembolism laboratory testing (factor V Leiden and factor II c.*97G>A), 2018 update: a technical standard of the [place] ([facility]). Genet Med. 2018 Dec;20(12):1489-1498. doi: J397249. Epub 2018 Oct 5. PMID: [id number].  Reviewed by: Comment  Comment: (NOTE) Technical Component performed at [facility] Professional Component performed by: Earlean Polka, Ph.D., [facility] Director, Molecular Genetics [place] [address] Performed At: [facility] [address] [place], [place] [id number] Katina Degree MDPhD [id number]     View Full Report     Specimen Collected: 11/07/22 14:10 Last Resulted: 11/16/22 10:36         Result Care Coordination   Patient Communication   Add Comments   Seen Back to Top       Cardiolipin antibodies, IgG, IgM, IgA Order: [id number] Collected 11/07/2022 14:10   0 Result Notes    Component Ref Range & Units 2 wk ago  Anticardiolipin IgG 0 - 14 GPL U/mL <9  Comment: (NOTE)                          Negative:              <15                          Indeterminate:     15 - 20                          Low-Med Positive: >20 - 80                          High Positive:         >80  Anticardiolipin IgM 0 - 12 MPL U/mL <9  Comment: (NOTE)                          Negative:              <13                          Indeterminate:     13 - 20                          Low-Med Positive: >20 - 80  High Positive:         >80  Anticardiolipin IgA 0 - 11 APL  U/mL <9  Comment: (NOTE)                          Negative:              <12                          Indeterminate:     12 - 20                          Low-Med Positive: >20 - 80                          High Positive:         >80 Performed At: [facility] [address] [place], [place] [id number] Rush Farmer MD [id number]     View Full Report     Specimen Collected: 11/07/22 14:10 Last Resulted: 11/09/22 18:36         Result Care Coordination   Patient Communication   Add Comments   Seen Back to Top         RADIOGRAPHIC STUDIES: I have personally reviewed the radiological images as listed and agreed with the findings in the report. US Venous Img Upper Uni Left (DVT)  Result Date: 10/25/2022 CLINICAL DATA:  Follow-up left basilic vein thrombus, currently on anticoagulation. Evaluate for acute or chronic DVT/SVT. EXAM: LEFT UPPER EXTREMITY VENOUS DOPPLER ULTRASOUND TECHNIQUE: Gray-scale sonography with graded compression, as well as color Doppler and duplex ultrasound were performed to evaluate the upper extremity deep venous system from the level of the subclavian vein and including the jugular, axillary, basilic, radial, ulnar and upper cephalic vein. Spectral Doppler was utilized to evaluate flow at rest and with distal augmentation maneuvers. COMPARISON:  Left upper extremity venous Doppler ultrasound-10/13/2022 (positive for occlusive superficial thrombophlebitis involving the left basilic vein). FINDINGS: Contralateral Subclavian Vein: Respiratory phasicity is normal and symmetric with the symptomatic side. No evidence of thrombus. Normal compressibility. Internal Jugular Vein: No evidence of thrombus. Normal compressibility, respiratory phasicity and response to augmentation. Subclavian Vein: No evidence of thrombus. Normal compressibility, respiratory phasicity and response to augmentation. Axillary Vein: No evidence of thrombus. Normal compressibility,  respiratory phasicity and response to augmentation. Cephalic Vein: No evidence of thrombus. Normal compressibility, respiratory phasicity and response to augmentation. Basilic Vein: Grossly unchanged occlusive thrombus involving the left basilic vein (images 19 through 22), similar to the 10/13/2022 examination. Brachial Veins: No evidence of thrombus. Normal compressibility, respiratory phasicity and response to augmentation. Radial Veins: No evidence of thrombus. Normal compressibility, respiratory phasicity and response to augmentation. Ulnar Veins: No evidence of thrombus. Normal compressibility, respiratory phasicity and response to augmentation. Other Findings:  None visualized. IMPRESSION: 1. No evidence of acute or chronic DVT within the left upper extremity. 2. Unchanged occlusive superficial thrombophlebitis involving the left basilic vein without evidence of central propagation. Electronically Signed   By: Sandi Mariscal M.D.   On: 10/25/2022 15:34    ASSESSMENT & PLAN:   Wonderful 52 y/o female with:  Left upper extremity basilic vein superficial venous thrombophlebitis. Previous PICC level. FHx of factor V Leiden mutation-- patient neg for FVL mutation.  PLAN:  -Discussed lab results from 11/07/2022 with the patient. CBC and CMP are stable.  Lupus anticoagulant panel neg Homocysteine levels in the normal range at 9.5.  Antithrombin III level in the normal range.  -Beta2GP and Anti cardiolipin IgG, IgM, and IgA levels are in the normal range.  Protein C and Protein S in the normal range.  -Factor V Leiden not detectable.  -Prothrombin gene mutation neg  -Lab results did not show any genetic disease or other abnormality that could cause superficial venous clots. Not DVT. -Discussed that her superficial venous clots were most likely due to hormonal medications. -Discussed that it is risks vs benefit issue for starting progesterone medication. -Answered all of patient's questions.   -recommended taking aspirin for 2 months for low risk SVT -Continue to follow-up with PCP.  FOLLOW-UP: RTC with PCP  The total time spent in the appointment was 20 minutes* .  All of the patient's questions were answered with apparent satisfaction. The patient knows to call the clinic with any problems, questions or concerns.   Sullivan Lone MD MS AAHIVMS [facility] Hematology/Oncology Physician [facility]  .*Total Encounter Time as defined by the Centers for Medicare and Medicaid Services includes, in addition to the face-to-face time of a patient visit (documented in the note above) non-face-to-face time: obtaining and reviewing outside history, ordering and reviewing medications, tests or procedures, care coordination (communications with other health care professionals or caregivers) and documentation in the medical record.   I, Cleda Mccreedy, am acting as a Education administrator for Sullivan Lone, MD. .I have reviewed the above documentation for accuracy and completeness, and I agree with the above. Brunetta Genera MD

## 2022-11-27 ENCOUNTER — Encounter: Payer: Self-pay | Admitting: Psychiatry

## 2022-11-27 ENCOUNTER — Ambulatory Visit (INDEPENDENT_AMBULATORY_CARE_PROVIDER_SITE_OTHER): Payer: Commercial Managed Care - HMO | Admitting: Psychiatry

## 2022-11-27 DIAGNOSIS — F411 Generalized anxiety disorder: Secondary | ICD-10-CM | POA: Diagnosis not present

## 2022-11-27 DIAGNOSIS — F4001 Agoraphobia with panic disorder: Secondary | ICD-10-CM

## 2022-11-27 DIAGNOSIS — F5105 Insomnia due to other mental disorder: Secondary | ICD-10-CM

## 2022-11-27 DIAGNOSIS — G2581 Restless legs syndrome: Secondary | ICD-10-CM | POA: Diagnosis not present

## 2022-11-27 DIAGNOSIS — G4733 Obstructive sleep apnea (adult) (pediatric): Secondary | ICD-10-CM

## 2022-11-27 MED ORDER — GABAPENTIN 600 MG PO TABS: 600.0000 mg | ORAL_TABLET | Freq: Three times a day (TID) | ORAL | 1 refills | Status: DC

## 2022-11-27 NOTE — Progress Notes (Signed)
[name] EY:[id number] 04-May-1971 52 y.o.  Subjective:   Patient ID:  [name] is a 52 y.o. (DOB 18-Jan-1971) female.  Chief Complaint:  Chief Complaint  Patient presents with   Follow-up   Depression   Sleeping Problem   Fatigue    HPI [name] presents to the office today for follow-up of panic disorder, generalized anxiety, and insomnia.  First seen December 02, 2019. Referred by Dr. Deland Pretty.  She was on alprazolam XR 0.5 mg twice daily and citalopram 40 mg daily along with opiates at the time. Buspirone was initiated.  04/21/2020 appointment with the following noted: Covid and hosp for a week 12/26/19.  Got Remdesivir.  Finally recovering.  Still tired and aches. Steroids helped chronic pain from Lyme dz but relapsed off steroids. On opiates [place] still.  Still on Xanax XR 0.5 mg BID, citalopram 40 mg daily.  Off buspirone bc HA and shakey after a week. Still anxious about Covid.  Has to push herself to leave the house.  No full panic but panicky feelings. Still sees therapist Windle Guard. Settled worker's comp case from 10 years ago. Celexa does best of SSRI she's taken but not controlled. Normal QT interval now. Trouble with sleep sometimes. Plan: She prefers rispieridone 0.5 mg HS for a week then 1 tablet nightly if needed.  06/23/2020 appointment with the following noted: Too foggy and slowed down on risperidone 0.5 mg after a week's trial. So stopped it. Went to Post-Covid clinic yesterday with good antibodies.   Shouldn't worry over covid but still does.  Life in limbo after settled worker's comp. Exercising, pray, writing and therapy with [employer]. Can awaken with panic every 2-3 days.  Distracts herself. Awoke at 2AM with panic with fear of not breathing.  Anxiety driving.   Chronic fatigue and sleepy before and worse after post Covid  Plan:  Retry gabapentin  800 mg tablet 1/2 tablet QID up to 1 tablet QID  08/23/2020 appointment with the  following noted: Increase gabapentin to 800 mg QID.  Works good for 2 hours and then seems to wear off.   NO SE.  Asks about time release.  Helped with panicky feelings and pain.   Hx RLS Anxiety is improved but not gone as noted.  When it runs out still awakens with panic but able to go back to sleep.  04/14/21 appt noted: Hydroxyzine for IC. Wants gabapentin ER and needs PA to get it.  Has taken regular gabapentin in it's place.  Regular gabapentin will not keep her asleep and awakens about 5 AM feeling panicky after 5-6 hours of sleep.  Gabapentin helps anxiety, pain and RLS but inadequate duration.  Has never taken gabapentin ER but wants to take it for greater duration and to stop withdrawal sx from the short acting gabapentin. Gabapentin helps ropinirole help RLS but it wears off as noted.  Gabapentin helps pain with better sleep until it wears off. "Tremendous" dental work repeatedly with dental problems causes anxiety.  [place] was causing dental problems.  3 more surgeries and then will be done.  Tooth pain and gum problems. Not sig depressed.  06/23/21 appt noted: PA denied for ER gabapentin bc not on formulary. Taking gabapentin 800 QID instead. Still wakens at night with anxiety and thinking of all she needs to do.  Worrying for ahwile.  Avg 5-7 hours of sleep.  Family interferes some too. Can awaken with hear racing and feeling panicky. Not depressed  but anxious. Plan: Trial mirtazapine 30  09/01/2021 appointment with the following noted: Never got mirtazapine. 18 M and F with health problems in tandem with sister.  She does the night shifts with mother and then trouble sleeping  after it.  Parents are gettting better but still anxious.  A lot of stress.  Getting a couple of hours nap and then 4-5  hours at night.  Sleep is still irregular.  Some nights EFA.  Still RLS problems several nights per week when first tries to go to sleep about half the nights. No other med  changes.   Asked about a girl in the news.   Plan: Gabapentin 800 QID since can't get horizant Continue citalopram 40 Hold mirtazapine 30 Clonidine 0.1 mg start 1/2 tablet at night and gradually increase to 1/2 in the AM and 1 at night for sleep and anxiety and perimenopausal  sweating off label.  12/26/2021 appointment with the following noted: Pending sleep repeat study bc witnessed loud snoring and periods of apnea and excessive daytime drowsiness regardless of sleep quantity. Witnessed in sleep clinic by staff while there with mother in sleep study.  Health issues..  Will talk to Dr. Shelia Media. When increased clonidine to 0.1 mg HA so reduced to 0.05 mg HS Still sweats but not as heavy and BP better with clonidine. Helps care for M with pulm fibrosis and hip fx.  Stress with that.  Worry over mother.    03/27/22 appt noted:  Administrative px around getting sleep study.   Wants LA gabapentin to see if can sleep later and also bc short acting gabapentin causes diarrhea and hopes LA would control pain better. Sees pain doc. Anxiiety is not severe but varies with pain. Chronic pain associated with severe muscle pain assoc with history of Lyme dz and chronic abdominal pain.  Went to [employer] [facility] [employer] for 6 mos. Daytime drowsiness. Has to avoid caffeine bc heart racing. Needs to nap. CC sleepy, pain, and anxity M is sick and caring for her.  06/14/22 appt noted: Still waiting for sleep study. Still taking clonidine 0.05 mg BID helps anxiety and BP. Continues citalopram, gabapentin 800 mg TID, hydroxyzine for IC, Xanax XR 0.5 mg BID Still on opiate, Butrans Still sleepy and tired ongoing and predates current meds.  Doesn't feel rested.  Had these sx for years with remote negative sleep study. Had shingles in face and ear.  Still a little pain on side of head. Plan no changes yet.  Pending sleep study  10/03/22 TC  Patient is requesting to titrate down on the Gabapentin . She  tried to cut in half without advisement, but she is not having success.   Pt wants to decrease gabapentin due to doing some research and finding that it can contribute to bladder problems ,which she already has.Also she noticed that she is "not as sharp."She tried taking a half tab and it made her shaky,have headaches,and her pain level was out of control.    MD resp:  Yes she can trigger withdrawal symptoms if she drops the dose of gabapentin too much at 1 time.  But it is not a difficult medicine to reduce if it is done gradually.  I sent in a prescription for 600 mg tablet instead of the 800 mg tablet and she takes 1 4 times a day and that transition should be fairly easy but give it about a week for her body to adjust.     11/27/22  appt noted: Psych med: Xanax XR 0.5 BID, gabapentin 600 TID, citalopram 40, clonidine 0.1 HS, hydroxyzine 25 prn, ropinirole 2 HS Had WD with less gabapentin.  At 600 TID.  Was wanting to reduce bc cognitive fog.  Forgetting names and thought gabapentin could be related. Does feel sharper with lower dose. Rx for pain and anxiety and did help anxiety.   Anxiety has crept up some with less gabapentin.   Sleep is pretty good.  Staying up later so can stay asleep.  Used to wake up in panic.   Struggling to lose wt.  Afraid to take Ozempic.  M pulm fiibrosis. Would like to later try reducing gabapentin.   Past Psychiatric History: therapist [facility] Dr. Porfirio Mylar. Dr. Koleen Distance [facility] Past Psychiatric Medication Trials:  Zoloft SE HA and itching,  Paxil wt gain 30# and fatigue,  Fluoxetine NR,  Lexapro  not as good as Celexa Cymbalta SE dryness dental problems, no pain benefit Gabapentin NR pain midrange, Horizant Gabatril for sleep 16 mg  Alprazolam 1 mg BID,  Xanax XR Buspirone SE HA  Risperidone SE Trazodone, NR Ambien and Lunesta NR Mirtazapine 30 never taken  Review of Systems:  Review of Systems  Constitutional:  Positive for fatigue.   HENT:  Positive for dental problem.   Respiratory:  Positive for shortness of breath.   Cardiovascular:  Negative for chest pain and palpitations.  Gastrointestinal:  Positive for nausea.  Musculoskeletal:  Positive for myalgias.  Neurological:  Positive for weakness. Negative for dizziness and tremors.       Restless legs  Psychiatric/Behavioral:  Positive for sleep disturbance. The patient is nervous/anxious.     Medications: I have reviewed the patient's current medications.  Current Outpatient Medications  Medication Sig Dispense Refill   acetaminophen (TYLENOL) 650 MG CR tablet Take by mouth.     albuterol (PROAIR HFA) 108 (90 Base) MCG/ACT inhaler Inhale 2 puffs into the lungs every 6 (six) hours as needed for wheezing or shortness of breath. 18 g 3   ALPRAZolam (XANAX XR) 0.5 MG 24 hr tablet Take 0.5 mg by mouth in the morning and at bedtime. BID     aspirin EC 81 MG tablet Take 81 mg by mouth daily. Swallow whole. Take 2 daily     baclofen (LIORESAL) 20 MG tablet Take 10 mg by mouth as needed.     bisoprolol (ZEBETA) 10 MG tablet Take 10 mg by mouth daily.      buprenorphine (BUTRANS) 15 MCG/HR Place onto the skin once a week.     cetirizine (ZYRTEC) 10 MG tablet Take 10 mg by mouth daily.      Cholecalciferol (VITAMIN D3 PO) Take 5,000 Int'l Units by mouth daily.     citalopram (CELEXA) 40 MG tablet Take 40 mg by mouth daily.     cloNIDine (CATAPRES) 0.1 MG tablet TAKE 1/2 TABLET BY MOUTH IN THE MORNING AND 1 TAB AT BEDTIME (Patient taking differently: Take 0.1 mg by mouth at bedtime. TAKE 1/2 TABLET BY MOUTH IN THE MORNING AND 1 TAB AT BEDTIME) 135 tablet 1   fluticasone (FLONASE) 50 MCG/ACT nasal spray      hydrOXYzine (ATARAX/VISTARIL) 25 MG tablet Take 25 mg by mouth as needed for anxiety.      Hyoscyamine Sulfate SL 0.125 MG SUBL Take 0.125 mg by mouth every 4 (four) hours as needed for cramping.     Melatonin 10 MG TABS Take 5 mg by mouth at bedtime.  Multiple Vitamin  (MULTI-VITAMIN) tablet Take by mouth.     Probiotic Product (ALIGN PO) Take 1 capsule by mouth as needed.     prochlorperazine (COMPAZINE) 10 MG tablet Take 10 mg by mouth every 6 (six) hours as needed for nausea or vomiting.      promethazine (PHENERGAN) 25 MG tablet Take 25 mg by mouth every 6 (six) hours as needed for nausea or vomiting.      rOPINIRole (REQUIP) 2 MG tablet Take 2 mg by mouth at bedtime.      Spacer/Aero-Holding Chambers (AEROCHAMBER MV) inhaler Use with inhaler 1 each 0   thyroid (ARMOUR) 120 MG tablet Take by mouth.     vitamin B-12 (CYANOCOBALAMIN) 500 MCG tablet Take 500 mcg by mouth daily.     gabapentin (NEURONTIN) 600 MG tablet Take 1 tablet (600 mg total) by mouth 3 (three) times daily. 270 tablet 1   No current facility-administered medications for this visit.    Medication Side Effects: None  Allergies:  Allergies  Allergen Reactions   Allegra [Fexofenadine Hcl] Shortness Of Breath and Rash   Fexofenadine Shortness Of Breath, Rash and Other (See Comments)    Shakes, can't sleep, trouble breathing   Oseltamivir Other (See Comments)    Can't breathe, sick to stomach   Tamiflu [Oseltamivir Phosphate] Itching, Nausea And Vomiting and Other (See Comments)    Can't breathe, sick to stomach   Vancomycin Other (See Comments)    Red man's Syndrome.   Diphenhydramine Other (See Comments)    Shakes, can't sleep, trouble breathing   Latex Itching, Dermatitis and Rash   Sulfamethoxazole Rash   Aspirin Nausea And Vomiting   Doxycycline    Other     NUT ALLERGY   Diphenhydramine Hcl Palpitations and Rash   Morphine Other (See Comments)    REACTION: insomnia   Oseltamivir Phosphate Itching and Nausea And Vomiting   Penicillins Itching and Swelling    Can tolerate Amoxicillin OK.   Sulfonamide Derivatives Rash    Past Medical History:  Diagnosis Date   Anxiety    Arrhythmia    heart races when pt in pain   Asthma    Clostridium difficile infection 2014    Epstein Barr virus infection    Fibromyalgia    GERD (gastroesophageal reflux disease)    History of COVID-19    Hypertension    Hypothyroidism    Immune deficiency disorder ([place])    Interstitial cystitis    Lyme disease    Mold contact confirmed    Toxic Mold syndrome   Mononucleosis 01/2009   Ovarian cyst    Serous Cystadenofibroma-Left ovary   PONV (postoperative nausea and vomiting)    Shortness of breath    on exertion   Systolic murmur    Thrombophlebitis    Thyroid disease     Family History  Problem Relation Age of Onset   Rheum arthritis Mother    Hypertension Mother    Diabetes Mother    Pulmonary embolism Mother        x2   Heart disease Mother    Other Mother        factor 5   Hypertension Father    Thyroid disease Father    Breast cancer Maternal Aunt        Age 69   Pancreatic cancer Maternal Grandmother    Heart disease Maternal Grandfather    Breast cancer Paternal Grandmother        Age 83  Asthma Paternal Grandmother     Social History   Socioeconomic History   Marital status: Single    Spouse name: Not on file   Number of children: 0   Years of education: college   Highest education level: Not on file  Occupational History   Occupation: Pharmacist, hospital  Tobacco Use   Smoking status: Never   Smokeless tobacco: Never  Vaping Use   Vaping Use: Never used  Substance and Sexual Activity   Alcohol use: No    Alcohol/week: 0.0 standard drinks of alcohol   Drug use: No   Sexual activity: Not Currently    Birth control/protection: Abstinence  Other Topics Concern   Not on file  Social History Narrative   Lives at home alone.   Right-handed.   Caffeine use: 1 cup per day.   Social Determinants of Health   Financial Resource Strain: Not on file  Food Insecurity: No Food Insecurity (11/04/2022)   Hunger Vital Sign    Worried About Running Out of Food in the Last Year: Never true    Ran Out of Food in the Last Year: Never true   Transportation Needs: No Transportation Needs (11/04/2022)   PRAPARE - Hydrologist (Medical): No    Lack of Transportation (Non-Medical): No  Physical Activity: Not on file  Stress: Not on file  Social Connections: Not on file  Intimate Partner Violence: Not At Risk (11/04/2022)   Humiliation, Afraid, Rape, and Kick questionnaire    Fear of Current or Ex-Partner: No    Emotionally Abused: No    Physically Abused: No    Sexually Abused: No    Past Medical History, Surgical history, Social history, and Family history were reviewed and updated as appropriate.   Please see review of systems for further details on the patient's review from today.   Objective:   Physical Exam:  There were no vitals taken for this visit.  Physical Exam Constitutional:      General: She is not in acute distress.    Appearance: She is obese.  Musculoskeletal:        General: No deformity.  Neurological:     Mental Status: She is alert and oriented to person, place, and time.     Coordination: Coordination normal.  Psychiatric:        Attention and Perception: Attention and perception normal. She does not perceive auditory or visual hallucinations.        Mood and Affect: Mood is anxious. Mood is not depressed. Affect is not labile, blunt, tearful or inappropriate.        Speech: Speech normal. Speech is not slurred.        Behavior: Behavior normal. Behavior is not slowed.        Thought Content: Thought content normal. Thought content is not paranoid or delusional. Thought content does not include homicidal or suicidal ideation. Thought content does not include suicidal plan.        Cognition and Memory: Cognition and memory normal.        Judgment: Judgment normal.     Comments: Insight fair Chronic stress and anxiety ongoing including about meds  anxious questions     Lab Review:     Component Value Date/Time   NA 139 11/07/2022 1410   NA 140 01/23/2020 1202    NA 142 09/09/2013 1102   K 3.9 11/07/2022 1410   K 4.3 09/09/2013 1102   CL 103 11/07/2022 1410  CO2 29 11/07/2022 1410   CO2 27 09/09/2013 1102   GLUCOSE 99 11/07/2022 1410   GLUCOSE 95 09/09/2013 1102   BUN 8 11/07/2022 1410   BUN 12 01/23/2020 1202   BUN 11.0 09/09/2013 1102   CREATININE 0.79 11/07/2022 1410   CREATININE 0.9 09/09/2013 1102   CALCIUM 9.1 11/07/2022 1410   CALCIUM 9.9 09/09/2013 1102   PROT 7.4 11/07/2022 1410   PROT 7.1 01/23/2020 1202   PROT 7.9 09/09/2013 1102   ALBUMIN 4.1 11/07/2022 1410   ALBUMIN 4.2 01/23/2020 1202   ALBUMIN 3.5 09/09/2013 1102   AST 18 11/07/2022 1410   AST 19 09/09/2013 1102   ALT 14 11/07/2022 1410   ALT 18 09/09/2013 1102   ALKPHOS 60 11/07/2022 1410   ALKPHOS 80 09/09/2013 1102   BILITOT 0.8 11/07/2022 1410   BILITOT 0.53 09/09/2013 1102   GFRNONAA >60 11/07/2022 1410   GFRAA 92 01/23/2020 1202       Component Value Date/Time   WBC 9.9 11/07/2022 1410   WBC 10.2 10/13/2022 1657   RBC 4.63 11/07/2022 1410   HGB 13.4 11/07/2022 1410   HGB 12.7 01/23/2020 1202   HGB 13.9 09/09/2013 1102   HCT 39.6 11/07/2022 1410   HCT 39.5 01/23/2020 1202   HCT 40.9 09/09/2013 1102   PLT 289 11/07/2022 1410   PLT 332 01/23/2020 1202   MCV 85.5 11/07/2022 1410   MCV 88 01/23/2020 1202   MCV 89.1 09/09/2013 1102   MCH 28.9 11/07/2022 1410   MCHC 33.8 11/07/2022 1410   RDW 12.3 11/07/2022 1410   RDW 13.5 01/23/2020 1202   RDW 13.6 09/09/2013 1102   LYMPHSABS 2.7 11/07/2022 1410   LYMPHSABS 3.1 01/23/2020 1202   LYMPHSABS 3.2 09/09/2013 1102   MONOABS 0.5 11/07/2022 1410   MONOABS 0.5 09/09/2013 1102   EOSABS 0.4 11/07/2022 1410   EOSABS 0.4 01/23/2020 1202   BASOSABS 0.0 11/07/2022 1410   BASOSABS 0.1 01/23/2020 1202   BASOSABS 0.1 09/09/2013 1102    No results found for: "POCLITH", "LITHIUM"   No results found for: "PHENYTOIN", "PHENOBARB", "VALPROATE", "CBMZ"   .res Assessment: Plan:    Laetitia was seen today for  follow-up, depression, sleeping problem and fatigue.  Diagnoses and all orders for this visit:  Generalized anxiety disorder -     gabapentin (NEURONTIN) 600 MG tablet; Take 1 tablet (600 mg total) by mouth 3 (three) times daily.  Panic disorder with agoraphobia -     gabapentin (NEURONTIN) 600 MG tablet; Take 1 tablet (600 mg total) by mouth 3 (three) times daily.  Restless legs syndrome  Insomnia due to mental condition  Mild obstructive sleep apnea  Anxiety is no better than last time with partial response to current meds:  . Intolerant of buspirone, and risperidone.  [employer]iser [employer] DT multiple med failures.  Patient is also on opiates.  Disc risk with Xanax.  We discussed the short-term risks associated with benzodiazepines including sedation and increased fall risk among others.  Discussed long-term side effect risk including dependence, potential withdrawal symptoms, and the potential eventual dose-related risk of dementia.  But recent studies from 2020 dispute this association between benzodiazepines and dementia risk. Newer studies in 2020 do not support an association with dementia.   Options risperidone off label, switch citalopram to Viibryd, Luvox, off label Trileptal, venlafaxine, Abilify, clonidine, retry higher dose gabapentin, higher than 40 citalopram but would have to get EKG. EKG 01/14/20 QTc 512 so cannot increase citalopram. Also hesitant to  use TCA for the same reason.  Gabapentin 600 QID since can't get horizant Continue alprazolam XR 0.5 mg twice daily  Continue citalopram 40 Clonidine 0.1 mg 1/2 tablet twice daily prn sweats and anxiety off label.  Mild sleep apnea Rx dental appliance  (NAC) N-Acetylcysteine 2 of the  600 mg capsules daily to help with mild cognitive problems.  It can be combined with a B-complex vitamin as the B-12 and folate which can sometimes enhance the effect.  Consider modafinil if OSA  FU 3-4 mos  Lynder Parents, MD,  DFAPA   Please see After Visit Summary for patient specific instructions.  Future Appointments  Date Time Provider [place]  01/22/2023  2:00 PM Clayton Bibles, NP [facility] None  08/06/2023  4:00 PM Princess Bruins, MD [facility] None    No orders of the defined types were placed in this encounter.    -------------------------------

## 2022-11-27 NOTE — Patient Instructions (Signed)
(  NAC) N-Acetylcysteine 2 of the  600 mg capsules daily to help with mild cognitive problems.  It can be combined with a B-complex vitamin as the B-12 and folate which can sometimes enhance the effect.  

## 2022-11-28 ENCOUNTER — Other Ambulatory Visit: Payer: Self-pay | Admitting: Obstetrics & Gynecology

## 2022-11-28 DIAGNOSIS — Z1231 Encounter for screening mammogram for malignant neoplasm of breast: Secondary | ICD-10-CM

## 2022-11-30 ENCOUNTER — Ambulatory Visit
Admission: RE | Admit: 2022-11-30 | Discharge: 2022-11-30 | Disposition: A | Discharge status: Home / Self Care | Payer: Commercial Managed Care - HMO | Source: Ambulatory Visit | Attending: Obstetrics & Gynecology | Admitting: Obstetrics & Gynecology

## 2022-11-30 DIAGNOSIS — Z1231 Encounter for screening mammogram for malignant neoplasm of breast: Secondary | ICD-10-CM

## 2022-11-30 NOTE — Progress Notes (Signed)
This encounter was created in error - please disregard.

## 2022-12-25 ENCOUNTER — Other Ambulatory Visit: Payer: Self-pay | Admitting: Psychiatry

## 2022-12-25 DIAGNOSIS — F4001 Agoraphobia with panic disorder: Secondary | ICD-10-CM

## 2022-12-25 DIAGNOSIS — F411 Generalized anxiety disorder: Secondary | ICD-10-CM

## 2022-12-25 DIAGNOSIS — G2581 Restless legs syndrome: Secondary | ICD-10-CM

## 2022-12-25 DIAGNOSIS — F5105 Insomnia due to other mental disorder: Secondary | ICD-10-CM

## 2023-01-07 ENCOUNTER — Ambulatory Visit
Admission: EM | Admit: 2023-01-07 | Discharge: 2023-01-07 | Disposition: A | Discharge status: Home / Self Care | Payer: Commercial Managed Care - HMO | Attending: Nurse Practitioner | Admitting: Nurse Practitioner

## 2023-01-07 DIAGNOSIS — Z23 Encounter for immunization: Secondary | ICD-10-CM

## 2023-01-07 DIAGNOSIS — L03011 Cellulitis of right finger: Secondary | ICD-10-CM

## 2023-01-07 MED ORDER — TETANUS-DIPHTH-ACELL PERTUSSIS 5-2.5-18.5 LF-MCG/0.5 IM SUSY
0.5000 mL | PREFILLED_SYRINGE | Freq: Once | INTRAMUSCULAR | Status: AC
  Administered 2023-01-07: 0.5 mL via INTRAMUSCULAR

## 2023-01-07 MED ORDER — AMOXICILLIN-POT CLAVULANATE 875-125 MG PO TABS
1.0000 | ORAL_TABLET | Freq: Two times a day (BID) | ORAL | 0 refills | Status: AC
Start: 2023-01-07 — End: 2023-01-14

## 2023-01-07 NOTE — ED Provider Notes (Signed)
[facility]    CSN: [id number] Arrival date & time: 01/07/23  1021      History   Chief Complaint Chief Complaint  Patient presents with   Laceration    HPI [name] is a 52 y.o. female presents for evaluation of a finger infection.  Patient reports 5 days ago while working in the yard she poked or cut her medial aspect of her right index finger with something in the garden.  She cleaned it with soap and water and has not been applying Neosporin but has noticed increasing swelling and pain around the nailbed.  No drainage, fevers or chills.  Denies foreign body sensation.  Denies any history of MRSA.  She does bite her fingernails.  She is not up-to-date on her tetanus.  No other concerns at this time.   Laceration   Past Medical History:  Diagnosis Date   Anxiety    Arrhythmia    heart races when pt in pain   Asthma    Clostridium difficile infection 2014   Epstein Barr virus infection    Fibromyalgia    GERD (gastroesophageal reflux disease)    History of COVID-19    Hypertension    Hypothyroidism    Immune deficiency disorder (HCC)    Interstitial cystitis    Lyme disease    Mold contact confirmed    Toxic Mold syndrome   Mononucleosis 01/2009   Ovarian cyst    Serous Cystadenofibroma-Left ovary   PONV (postoperative nausea and vomiting)    Shortness of breath    on exertion   Systolic murmur    Thrombophlebitis    Thyroid disease     Patient Active Problem List   Diagnosis Date Noted   Interstitial cystitis 07/31/2022   Mild obstructive sleep apnea 07/24/2022   Excessive daytime sleepiness 05/03/2022   History of COVID-19 01/20/2020   Brain fog 01/20/2020   Pneumonia due to COVID-19 virus 12/26/2019   Upper airway cough syndrome vs VCD vs asthma 11/12/2019   Personal history of endometriosis 11/30/2016   Obesity 07/03/2014   DOE (dyspnea on exertion) 07/02/2014   Clostridium difficile infection    Gastroparesis 06/15/2009    INSOMNIA 05/12/2009   EPIGASTRIC PAIN 04/15/2009   INFECTIOUS MONONUCLEOSIS 03/10/2009   TACHYCARDIA 03/10/2009   ADENOMATOUS COLONIC POLYP 12/05/2007   FUNGAL DERMATITIS 11/30/2006   HYPERLIPIDEMIA 11/30/2006   PANIC DISORDER 11/30/2006   Asthma 11/30/2006   GERD 11/30/2006   IRRITABLE BOWEL SYNDROME 11/30/2006   INTERSTITIAL CYSTITIS 11/30/2006   MYOFASCIAL PAIN SYNDROME 11/30/2006   CHRONIC FATIGUE SYNDROME 11/30/2006   HEADACHE 11/30/2006    Past Surgical History:  Procedure Laterality Date   BLADDER SURGERY     CHOLECYSTECTOMY     FECAL TRANSPLANT     Groin cyst     LAPAROSCOPY Right 06/12/2013   Procedure: LAPAROSCOPY OPERATIVE  fulgeration of endometriosis, right salpingooophorectomy;  Surgeon: Ok Edwards, MD;  Location: [facility];  Service: Gynecology;  Laterality: Right;   Nose cyst     OOPHORECTOMY Right 06/12/2013   Procedure: OOPHORECTOMY;POSS RIGHT SALPING OOPHORECTOMY;  Surgeon: Ok Edwards, MD;  Location: [facility];  Service: Gynecology;  Laterality: Right;   PELVIC LAPAROSCOPY     DL Ovarian cystectomy-Left   TONSILLECTOMY  2009    OB History     Gravida  0   Para      Term      Preterm      AB  Living         SAB      IAB      Ectopic      Multiple      Live Births               Home Medications    Prior to Admission medications   Medication Sig Start Date End Date Taking? Authorizing Provider  amoxicillin-clavulanate (AUGMENTIN) 875-125 MG tablet Take 1 tablet by mouth every 12 (twelve) hours for 7 days. 01/07/23 01/14/23 Yes Radford Pax, NP  acetaminophen (TYLENOL) 650 MG CR tablet Take by mouth.    [provider]  albuterol (PROAIR HFA) 108 (90 Base) MCG/ACT inhaler Inhale 2 puffs into the lungs every 6 (six) hours as needed for wheezing or shortness of breath. 11/24/19   Nyoka Cowden, MD  ALPRAZolam (XANAX XR) 0.5 MG 24 hr tablet Take 0.5 mg by mouth in the morning and at bedtime. BID    [provider]  aspirin EC 81 MG tablet Take 81 mg by mouth daily. Swallow whole. Take 2 daily    [provider]  baclofen (LIORESAL) 20 MG tablet Take 10 mg by mouth as needed. 05/23/18   [provider]  bisoprolol (ZEBETA) 10 MG tablet Take 10 mg by mouth daily.     [provider]  buprenorphine (BUTRANS) 15 MCG/HR Place onto the skin once a week.    [provider]  cetirizine (ZYRTEC) 10 MG tablet Take 10 mg by mouth daily.     [provider]  Cholecalciferol (VITAMIN D3 PO) Take 5,000 Int'l Units by mouth daily.    [provider]  citalopram (CELEXA) 40 MG tablet Take 40 mg by mouth daily.    [provider]  cloNIDine (CATAPRES) 0.1 MG tablet TAKE 1/2 TABLET BY MOUTH TWICE A DAY 12/27/22   Cottle, Steva Ready., MD  fluticasone [facility]) 50 MCG/ACT nasal spray     [provider]  gabapentin (NEURONTIN) 600 MG tablet Take 1 tablet (600 mg total) by mouth 3 (three) times daily. 11/27/22   Cottle, Steva Ready., MD  hydrOXYzine (ATARAX/VISTARIL) 25 MG tablet Take 25 mg by mouth as needed for anxiety.  08/19/17   [provider]  Hyoscyamine Sulfate SL 0.125 MG SUBL Take 0.125 mg by mouth every 4 (four) hours as needed for cramping. 05/23/18   [provider]  Melatonin 10 MG TABS Take 5 mg by mouth at bedtime.    [provider]  Multiple Vitamin (MULTI-VITAMIN) tablet Take by mouth.    [provider]  Probiotic Product (ALIGN PO) Take 1 capsule by mouth as needed.    [provider]  prochlorperazine (COMPAZINE) 10 MG tablet Take 10 mg by mouth every 6 (six) hours as needed for nausea or vomiting.     [provider]  promethazine (PHENERGAN) 25 MG tablet Take 25 mg by mouth every 6 (six) hours as needed for nausea or vomiting.     [provider]  rOPINIRole (REQUIP) 2 MG tablet Take 2 mg by mouth at bedtime.     [provider]  Spacer/Aero-Holding  Chambers (AEROCHAMBER MV) inhaler Use with inhaler 07/24/22   Cobb, Ruby Cola, NP  thyroid (ARMOUR) 120 MG tablet Take by mouth. 07/01/13   [provider]  vitamin B-12 (CYANOCOBALAMIN) 500 MCG tablet Take 500 mcg by mouth daily.    [provider]    Family History Family History  Problem Relation Age of Onset   Rheum arthritis Mother    Hypertension Mother    Diabetes Mother    Pulmonary embolism Mother        x2   Heart disease Mother    Other Mother        factor 5   Hypertension Father    Thyroid disease Father    Breast cancer Maternal Aunt        Age 35   Pancreatic cancer Maternal Grandmother    Heart disease Maternal Grandfather    Breast cancer Paternal Grandmother        Age 14   Asthma Paternal Grandmother     Social History Social History   Tobacco Use   Smoking status: Never   Smokeless tobacco: Never  Vaping Use   Vaping Use: Never used  Substance Use Topics   Alcohol use: No    Alcohol/week: 0.0 standard drinks of alcohol   Drug use: No     Allergies   Allegra [fexofenadine hcl], Fexofenadine, Oseltamivir, Tamiflu [oseltamivir phosphate], Vancomycin, Diphenhydramine, Latex, Sulfamethoxazole, Aspirin, Clindamycin/lincomycin, Doxycycline, Other, Diphenhydramine hcl, Morphine, Oseltamivir phosphate, Penicillins, and Sulfonamide derivatives   Review of Systems Review of Systems  Skin:        Finger infection      Physical Exam Triage Vital Signs ED Triage Vitals  Enc Vitals Group     BP 01/07/23 1126 122/85     Pulse Rate 01/07/23 1126 70     Resp 01/07/23 1126 18     Temp 01/07/23 1126 97.9 F (36.6 C)     Temp src --      SpO2 01/07/23 1126 95 %     Weight --      Height --      Head Circumference --      Peak Flow --      Pain Score 01/07/23 1125 6     Pain Loc --      Pain Edu? --      Excl. in GC? --    No data found.  Updated Vital Signs BP 122/85   Pulse 70   Temp 97.9 F (36.6 C)   Resp 18   LMP  12/11/2022   SpO2 95%   Visual Acuity Right Eye Distance:   Left Eye Distance:   Bilateral Distance:    Right Eye Near:   Left Eye Near:    Bilateral Near:     Physical Exam Vitals and nursing note reviewed.  Constitutional:      General: She is not in acute distress.    Appearance: Normal appearance. She is not ill-appearing.  HENT:     Head: Normocephalic and atraumatic.  Eyes:     Pupils: Pupils are equal, round, and reactive to light.  Cardiovascular:     Rate and Rhythm: Normal rate.  Pulmonary:     Effort: Pulmonary effort is normal.  Musculoskeletal:       Hands:     Comments: Mild swelling with erythema, warmth, and tenderness to the medial aspect of the right distal index finger adjacent to nail bed.  No induration or fluctuance.  No active drainage.  Cap refill +2.  Skin:    General: Skin is warm and dry.  Neurological:     General: No focal deficit present.     Mental Status: She is alert and oriented to person, place, and time.  Psychiatric:        Mood and Affect: Mood  normal.        Behavior: Behavior normal.      UC Treatments / Results  Labs (all labs ordered are listed, but only abnormal results are displayed) Labs Reviewed - No data to display  CMP [facility] only) Order: [id number] Status: Final result     Visible to patient: Yes (seen)     Next appt: 01/22/2023 at 02:00 PM in Pulmonology Noemi Chapel, NP)     Dx: Hypercoagulable state [facility]); FHx: fac...   0 Result Notes          Component Ref Range & Units 2 mo ago (11/07/22) 2 mo ago (10/13/22) 2 yr ago (01/23/20) 3 yr ago (12/30/19) 3 yr ago (12/29/19) 3 yr ago (12/28/19) 3 yr ago (12/27/19)  Sodium 135 - 145 mmol/L 139 140 140 R 141 140 141 141  Potassium 3.5 - 5.1 mmol/L 3.9 4.1 4.2 R 3.8 3.1 Low  4.1 4.1  Chloride 98 - 111 mmol/L 103 103 99 R 108 106 105 106  CO2 22 - 32 mmol/L 29 28 20  R 25 24 25 25   Glucose, Bld 70 - 99 mg/dL 99 93 CM 409 High  R 811 High  CM 166  High  CM 130 High  CM 144 High  CM  Comment: Glucose reference range applies only to samples taken after fasting for at least 8 hours.  BUN 6 - 20 mg/dL 8 8 12  R 7 6 5  Low  7  Creatinine 0.44 - 1.00 mg/dL 9.14 7.82 9.56 R 2.13 0.86 0.70 0.66  Calcium 8.9 - 10.3 mg/dL 9.1 9.2 9.3 R 8.2 Low  8.1 Low  8.5 Low  7.9 Low   Total Protein 6.5 - 8.1 g/dL 7.4  7.1 R 5.9 Low  5.9 Low  6.6 6.3 Low   Albumin 3.5 - 5.0 g/dL 4.1  4.2 R 2.8 Low  2.7 Low  2.7 Low  2.6 Low   AST 15 - 41 U/L 18  24 R 27 22 17 27   ALT 0 - 44 U/L 14  19 R 21 18 17 21   Alkaline Phosphatase 38 - 126 U/L 60  88 R, CM 48 49 52 57  Total Bilirubin 0.3 - 1.2 mg/dL 0.8  0.2 R 0.7 0.7 0.7 0.8  GFR, Estimated >60 mL/min >60  80 R >60 >60 >60 >60  Comment: (NOTE) Calculated using the CKD-EPI Creatinine Equation (2021)  Anion gap 5 - 15 7 9  CM  8 CM 10 CM 11 CM 10 CM  Comment: Performed at [facility], [address], [place], [place] [address]  Resulting Agency [facility]              EKG   Radiology No results found.  Procedures Procedures (including critical care time)  Medications Ordered in UC Medications  Tdap (BOOSTRIX) injection 0.5 mL (has no administration in time range)    Initial Impression / Assessment and Plan / UC Course  I have reviewed the triage vital signs and the nursing notes.  Pertinent labs & imaging results that were available during my care of the patient were reviewed by me and considered in my medical decision making (see chart for details).    Reviewed labs and recent prognosis notes with patient Reviewed exam and symptoms with patient. Start Augmentin for paronychia Tetanus updated in clinic Wound care reviewed PCP follow-up  if symptoms do not improve ER precautions reviewed and patient verbalized understanding Final Clinical Impressions(s) / UC Diagnoses   Final diagnoses:   Paronychia of right index finger     Discharge Instructions      Start Augmentin twice daily for 7 days You can do warm soapy water soaks or Epsom salt soaks to the finger as needed Your tetanus has been updated in clinic today Follow-up with your PCP if your symptoms do not improve Please go to the emergency room for any worsening symptoms   ED Prescriptions     Medication Sig Dispense Auth. Provider   amoxicillin-clavulanate (AUGMENTIN) 875-125 MG tablet Take 1 tablet by mouth every 12 (twelve) hours for 7 days. 14 tablet Radford Pax, NP      PDMP not reviewed this encounter.   Radford Pax, NP 01/07/23 1225

## 2023-01-07 NOTE — Discharge Instructions (Signed)
Start Augmentin twice daily for 7 days You can do warm soapy water soaks or Epsom salt soaks to the finger as needed Your tetanus has been updated in clinic today Follow-up with your PCP if your symptoms do not improve Please go to the emergency room for any worsening symptoms

## 2023-01-07 NOTE — ED Triage Notes (Signed)
Patient to Urgent Care with complaints of wound present to right index finger.   Reports injury occurred on Wednesday. Reports she was outside when she was working in her yard. Reports throbbing and burning pain. Swelling and redness around nail.  Last TDAP 02/20/2012.

## 2023-01-08 ENCOUNTER — Encounter (HOSPITAL_BASED_OUTPATIENT_CLINIC_OR_DEPARTMENT_OTHER): Payer: Self-pay | Admitting: Emergency Medicine

## 2023-01-08 ENCOUNTER — Emergency Department (HOSPITAL_BASED_OUTPATIENT_CLINIC_OR_DEPARTMENT_OTHER): Payer: Commercial Managed Care - HMO

## 2023-01-08 ENCOUNTER — Other Ambulatory Visit: Payer: Self-pay

## 2023-01-08 ENCOUNTER — Emergency Department (HOSPITAL_BASED_OUTPATIENT_CLINIC_OR_DEPARTMENT_OTHER)
Admission: EM | Admit: 2023-01-08 | Discharge: 2023-01-08 | Disposition: A | Discharge status: Home / Self Care | Payer: Commercial Managed Care - HMO | Attending: Emergency Medicine | Admitting: Emergency Medicine

## 2023-01-08 DIAGNOSIS — L049 Acute lymphadenitis, unspecified: Secondary | ICD-10-CM | POA: Insufficient documentation

## 2023-01-08 DIAGNOSIS — E039 Hypothyroidism, unspecified: Secondary | ICD-10-CM | POA: Insufficient documentation

## 2023-01-08 DIAGNOSIS — M79601 Pain in right arm: Secondary | ICD-10-CM | POA: Diagnosis present

## 2023-01-08 DIAGNOSIS — Z91040 Latex allergy status: Secondary | ICD-10-CM | POA: Diagnosis not present

## 2023-01-08 DIAGNOSIS — Z7901 Long term (current) use of anticoagulants: Secondary | ICD-10-CM | POA: Insufficient documentation

## 2023-01-08 DIAGNOSIS — J45909 Unspecified asthma, uncomplicated: Secondary | ICD-10-CM | POA: Insufficient documentation

## 2023-01-08 NOTE — ED Provider Notes (Signed)
[name]   CSN: [id number] Arrival date & time: 01/08/23  1113     History  Chief Complaint  Patient presents with   Arm Swelling    [name] is a 52 y.o. female.  Patient is a 52 year old female with a history of asthma, immune deficiency disorder, hypothyroidism, thrombophlebitis, DVT in the left upper extremity who was on Eliquis for short time and then was on aspirin and was able to discontinue that 1 month ago who is presenting today with complaints of pain and swelling in her right arm.  Patient states that she was gardening and cut her right index finger about 1 week ago and started noticing it was starting to look infected.  She went to urgent care yesterday and was started on Augmentin but noticed last night pain and swelling around the elbow.  The redness is improved this morning but still has pain and swelling and was concern for possible DVT is on is the way her other 1 started.  The DVT in her left upper extremity was thought to be related to hormone replacement therapy which she discontinued.  She notes there is no issues going on in the left arm today, she overall feels well.  She has no pain or swelling up in her neck or upper arm.  She has had 1 dose of Augmentin so far.  The history is provided by the patient.       Home Medications Prior to Admission medications   Medication Sig Start Date End Date Taking? Authorizing Provider  acetaminophen (TYLENOL) 650 MG CR tablet Take by mouth.    [provider]  albuterol (PROAIR HFA) 108 (90 Base) MCG/ACT inhaler Inhale 2 puffs into the lungs every 6 (six) hours as needed for wheezing or shortness of breath. 11/24/19   Nyoka Cowden, MD  ALPRAZolam (XANAX XR) 0.5 MG 24 hr tablet Take 0.5 mg by mouth in the morning and at bedtime. BID    [provider]  amoxicillin-clavulanate (AUGMENTIN) 875-125 MG tablet Take 1 tablet by mouth every 12  (twelve) hours for 7 days. 01/07/23 01/14/23  Radford Pax, NP  aspirin EC 81 MG tablet Take 81 mg by mouth daily. Swallow whole. Take 2 daily    [provider]  baclofen (LIORESAL) 20 MG tablet Take 10 mg by mouth as needed. 05/23/18   [provider]  bisoprolol (ZEBETA) 10 MG tablet Take 10 mg by mouth daily.     [provider]  buprenorphine (BUTRANS) 15 MCG/HR Place onto the skin once a week.    [provider]  cetirizine (ZYRTEC) 10 MG tablet Take 10 mg by mouth daily.     [provider]  Cholecalciferol (VITAMIN D3 PO) Take 5,000 Int'l Units by mouth daily.    [provider]  citalopram (CELEXA) 40 MG tablet Take 40 mg by mouth daily.    [provider]  cloNIDine (CATAPRES) 0.1 MG tablet TAKE 1/2 TABLET BY MOUTH TWICE A DAY 12/27/22   Cottle, Steva Ready., MD  fluticasone [facility]) 50 MCG/ACT nasal spray     [provider]  gabapentin (NEURONTIN) 600 MG tablet Take 1 tablet (600 mg total) by mouth 3 (three) times daily. 11/27/22   Cottle, Steva Ready., MD  hydrOXYzine (ATARAX/VISTARIL) 25 MG tablet Take 25 mg by mouth as needed for anxiety.  08/19/17   [provider]  Hyoscyamine Sulfate SL 0.125 MG  SUBL Take 0.125 mg by mouth every 4 (four) hours as needed for cramping. 05/23/18   [provider]  Melatonin 10 MG TABS Take 5 mg by mouth at bedtime.    [provider]  Multiple Vitamin (MULTI-VITAMIN) tablet Take by mouth.    [provider]  Probiotic Product (ALIGN PO) Take 1 capsule by mouth as needed.    [provider]  prochlorperazine (COMPAZINE) 10 MG tablet Take 10 mg by mouth every 6 (six) hours as needed for nausea or vomiting.     [provider]  promethazine (PHENERGAN) 25 MG tablet Take 25 mg by mouth every 6 (six) hours as needed for nausea or vomiting.     [provider]  rOPINIRole (REQUIP) 2 MG tablet Take 2 mg by mouth at bedtime.      [provider]  Spacer/Aero-Holding Chambers (AEROCHAMBER MV) inhaler Use with inhaler 07/24/22   Cobb, Ruby Cola, NP  thyroid (ARMOUR) 120 MG tablet Take by mouth. 07/01/13   [provider]  vitamin B-12 (CYANOCOBALAMIN) 500 MCG tablet Take 500 mcg by mouth daily.    [provider]      Allergies    Allegra [fexofenadine hcl], Fexofenadine, Oseltamivir, Tamiflu [oseltamivir phosphate], Vancomycin, Diphenhydramine, Latex, Sulfamethoxazole, Aspirin, Clindamycin/lincomycin, Doxycycline, Other, Diphenhydramine hcl, Morphine, Oseltamivir phosphate, Penicillins, and Sulfonamide derivatives    Review of Systems   Review of Systems  Physical Exam Updated Vital Signs BP (!) 140/72 (BP Location: Left Arm)   Pulse 71   Temp 98.3 F (36.8 C) (Oral)   Resp 18   Ht 5\' 1"  (1.549 m)   Wt 94.3 kg   LMP 12/11/2022   SpO2 96%   BMI 39.30 kg/m  Physical Exam Vitals and nursing Rush reviewed.  HENT:     Head: Normocephalic.  Cardiovascular:     Rate and Rhythm: Normal rate.     Pulses: Normal pulses.  Pulmonary:     Effort: Pulmonary effort is normal.  Musculoskeletal:        General: Tenderness present.       Arms:       Hands:     Cervical back: Normal range of motion.  Neurological:     Mental Status: She is alert. Mental status is at baseline.  Psychiatric:        Mood and Affect: Mood normal.     ED Results / Procedures / Treatments   Labs (all labs ordered are listed, but only abnormal results are displayed) Labs Reviewed - No data to display  EKG None  Radiology US Venous Img Upper Right (DVT Study)  Result Date: 01/08/2023 CLINICAL DATA:  Right upper extremity pain and edema. Evaluate for DVT. EXAM: RIGHT UPPER EXTREMITY VENOUS DOPPLER ULTRASOUND TECHNIQUE: Gray-scale sonography with graded compression, as well as color Doppler and duplex ultrasound were performed to evaluate the upper extremity deep venous system from the level of the  subclavian vein and including the jugular, axillary, basilic, radial, ulnar and upper cephalic vein. Spectral Doppler was utilized to evaluate flow at rest and with distal augmentation maneuvers. COMPARISON:  None Available. FINDINGS: Contralateral Subclavian Vein: Respiratory phasicity is normal and symmetric with the symptomatic side. No evidence of thrombus. Normal compressibility. Internal Jugular Vein: No evidence of thrombus. Normal compressibility, respiratory phasicity and response to augmentation. Subclavian Vein: No evidence of thrombus. Normal compressibility, respiratory phasicity and response to augmentation. Axillary Vein: No evidence of thrombus. Normal compressibility, respiratory phasicity and response to augmentation. Cephalic Vein:  No evidence of thrombus. Normal compressibility, respiratory phasicity and response to augmentation. Basilic Vein: No evidence of thrombus. Normal compressibility, respiratory phasicity and response to augmentation. Brachial Veins: No evidence of thrombus. Normal compressibility, respiratory phasicity and response to augmentation. Radial Veins: No evidence of thrombus. Normal compressibility, respiratory phasicity and response to augmentation. Ulnar Veins: No evidence of thrombus. Normal compressibility, respiratory phasicity and response to augmentation. Other Findings: Sonographic evaluation of patient's palpable area of concern involving the medial distal aspect of the right upper arm, superior to the elbow correlates with a fairly well-defined hypoechoic nodule which measures approximately 0.4 x 0.4 x 0.3 cm. This hypoechoic nodule is separate from the adjacent basilic vein. IMPRESSION: 1. No evidence of DVT or SVT within the right upper extremity. 2. Sonographic evaluation of patient's palpable area of concern involving the medial distal aspect of the right upper arm, superior to the elbow correlates with a punctate (0.4 cm) hypoechoic nodule, indeterminate though  potentially representative of a lymph node, of uncertain though doubtful clinical significance. Electronically Signed   By: Simonne Come M.D.   On: 01/08/2023 13:17    Procedures Procedures    Medications Ordered in ED Medications - No data to display  ED Course/ Medical Decision Making/ A&P                             Medical Decision Making Amount and/or Complexity of Data Reviewed Radiology: ordered and independent interpretation performed. Decision-making details documented in ED Course.   Pt with multiple medical problems and comorbidities and presenting today with a complaint that caries a high risk for morbidity and mortality.  Here today with pain and swelling near the right elbow in the setting of recent finger infection.  Has just initiated antibiotics but has a picture on her phone with significant erythema in the medial elbow region last night.  No significant erythema today but still some swelling and tenderness.  She has a prior history of upper extremity DVT in the left upper extremity.  She is not on any anticoagulation at this time.  She did discontinue hormone therapy which was thought to be the cause of her last DVT.  She has had no recent IVs or instrumentation done to this arm.  She has no ports or other devices on the side of her body.  Suspect most likely lymphadenopathy related to the recent infection but we will do an upper extremity ultrasound to rule out DVT.  1:24 PM I have independently visualized and interpreted pt's images today.  Right upper extremity ultrasound negative for DVT.  Radiology reports no evidence of DVT or SVT in the right upper extremity.  Area in question does appear to have a possible lymph node which would be suggestive of patient's recent infection and starting antibiotics.  No evidence of tenosynovitis at this time.  Will have her continue antibiotics, elevate the arm and follow-up with PCP.  Findings discussed with the patient.  She is  comfortable with this plan.          Final Clinical Impression(s) / ED Diagnoses Final diagnoses:  Lymphadenitis, acute    Rx / DC Orders ED Discharge Orders     None         Gwyneth Sprout, MD 01/08/23 1324

## 2023-01-08 NOTE — ED Notes (Signed)
RN provided AVS using Teachback Method. Patient verbalizes understanding of Discharge Instructions. Opportunity for Questioning and Answers were provided by RN. Patient Discharged from ED ambulatory to Home via Self.  

## 2023-01-08 NOTE — ED Triage Notes (Signed)
Pt via pov from home with right arm swelling today. Pt reports that she had blood clot in left arm in February. She recently had finger injury on right hand and is on Augmentin for infection. Pt alert & oriented, nad noted.

## 2023-01-08 NOTE — Discharge Instructions (Signed)
No evidence of blood clot in the arm today.  You do have a swollen lymph node which is most likely related to the recent infection.  Continue the Augmentin.  You can do warm compresses to the arm and elevate.  Follow-up with your doctor later this week for recheck.

## 2023-01-08 NOTE — ED Notes (Signed)
EDP at the Bedside. 

## 2023-01-08 NOTE — ED Notes (Signed)
Patient returned from US.

## 2023-01-22 ENCOUNTER — Ambulatory Visit (INDEPENDENT_AMBULATORY_CARE_PROVIDER_SITE_OTHER): Payer: Commercial Managed Care - HMO

## 2023-01-22 ENCOUNTER — Encounter: Payer: Self-pay | Admitting: Nurse Practitioner

## 2023-01-22 ENCOUNTER — Ambulatory Visit: Payer: Commercial Managed Care - HMO | Admitting: Nurse Practitioner

## 2023-01-22 VITALS — BP 114/70 | HR 75 | Temp 98.8°F | Ht 61.0 in | Wt 213.8 lb

## 2023-01-22 DIAGNOSIS — J1282 Pneumonia due to coronavirus disease 2019: Secondary | ICD-10-CM

## 2023-01-22 DIAGNOSIS — R0609 Other forms of dyspnea: Secondary | ICD-10-CM

## 2023-01-22 DIAGNOSIS — Z836 Family history of other diseases of the respiratory system: Secondary | ICD-10-CM | POA: Diagnosis not present

## 2023-01-22 DIAGNOSIS — J4530 Mild persistent asthma, uncomplicated: Secondary | ICD-10-CM | POA: Diagnosis not present

## 2023-01-22 DIAGNOSIS — G4733 Obstructive sleep apnea (adult) (pediatric): Secondary | ICD-10-CM | POA: Diagnosis not present

## 2023-01-22 DIAGNOSIS — U071 COVID-19: Secondary | ICD-10-CM

## 2023-01-22 MED ORDER — ARNUITY ELLIPTA 50 MCG/ACT IN AEPB
1.0000 | INHALATION_SPRAY | Freq: Every day | RESPIRATORY_TRACT | 5 refills | Status: AC
Start: 2023-01-22 — End: ?

## 2023-01-22 NOTE — Assessment & Plan Note (Signed)
See above. Action plan in place. 

## 2023-01-22 NOTE — Assessment & Plan Note (Signed)
Very mild OSA. She is currently managed with oral appliance, purchased online. She does she has received benefit from use. Encouraged her to continue monitoring her symptoms. If she has worsening daytime fatigue or sleep quality, could consider trial of CPAP therapy but she would like to refrain from now. Aware of safe driving practices. Encouraged to continue working on healthy weight loss measures.  Patient Instructions  Continue oral appliance at night  Continue working on healthy weight loss measures Let me know if you keep having trouble with your sleep still and we can look at CPAP    Continue Albuterol inhaler 2 puffs every 6 hours as needed for shortness of breath or wheezing. Notify if symptoms persist despite rescue inhaler/neb use. Continue zyrtec 1 tab daily for allergies  Continue flonase nasal spray 2 sprays each nostril daily  Chest x ray today. We will likely obtain a high resolution CT scan of your chest given family history and your breathing  Repeat pulmonary function testing    Follow up in 6-8 weeks with Dr. Marchelle Gearing after PFTs and CT scan, or Rhunette Croft NP if not available. If symptoms do not improve or worsen, please contact office for sooner follow up or seek emergency care.

## 2023-01-22 NOTE — Assessment & Plan Note (Addendum)
April 2021. Recovered well. No evidence of post COVID ILD on cardiac CT of the chest. See above plan.

## 2023-01-22 NOTE — Patient Instructions (Addendum)
Continue oral appliance at night  Continue working on healthy weight loss measures Let me know if you keep having trouble with your sleep still and we can look at CPAP    Continue Albuterol inhaler 2 puffs every 6 hours as needed for shortness of breath or wheezing. Notify if symptoms persist despite rescue inhaler/neb use. Continue zyrtec 1 tab daily for allergies  Continue flonase nasal spray 2 sprays each nostril daily  Chest x ray today. We will likely obtain a high resolution CT scan of your chest given family history and your breathing  Repeat pulmonary function testing    Follow up in 6-8 weeks with Dr. Marchelle Gearing after PFTs and CT scan, or Rhunette Croft NP if not available. If symptoms do not improve or worsen, please contact office for sooner follow up or seek emergency care.

## 2023-01-22 NOTE — Assessment & Plan Note (Signed)
Possibly due to poorly controlled asthma as she is off ICS now? Does not appear to be in acute exacerbation today and lung exam clear. We will restart her on maintenance ICS therapy - rx sent today. She will notify if this is not affordable and we will run a test claims. CXR today to rule out acute process. She does have a familial history of IPF. Given this and her progressive DOE, will obtain HRCT chest and repeat PFTs for monitoring. Encouraged her to remain active in interim.

## 2023-01-22 NOTE — Progress Notes (Signed)
@Patient  ID: [name], female    DOB: 12-10-1970, 52 y.o.   MRN: [id number]  Chief Complaint  Patient presents with   Follow-up    Sob, oral compliance from [facility] pt states she feels better.     Referring provider: Merri Brunette, MD  HPI: 52 year old female, never smoker followed for asthma, COVID-pneumonia April 2021, and mild OSA.  She is a patient of Dr. Jane Canary and last seen in office 07/24/2022 by Sheena Simonis,NP.  Past medical history significant for GERD, IBS, chronic fatigue, HLD, anxiety, obesity.  TEST/EVENTS:  04/03/2020 PFTs: FEV1 83%, ratio 82, FVC 82%.  No significant BD, mid flow reversibility.  DLCO 89%. 06/29/2022 HST: AHI 6.4/h, SPO2 low 81%, average 90%  04/15/2022: OV with Parrett NP for 17-month follow-up.  Breathing has improved.  Trying to be more active.  Does still have some limitations in activity tolerance.  Previously on Trelegy but unable to tolerate due to recurrent thrush.  Did feel like it helped her breathing.  Pulmonary function testing from April 03, 2020 showed normal lung function with mid flow reversibility.  Recommended starting her on Qvar low-dose ICS with spacer to see if this gives her better control and limits potential side effect such as thrush.  She had COVID-pneumonia in April 2021; clinically improved with clear chest x-ray.  Follow-up 3 months  05/03/2022: OV with Danaja Lasota NP for sleep consult. She has been struggling with daytime fatigue for a long time now but worse over the past few years. She wakes in the morning feeling like she barely slept. She has been told that she snores and she occasionally wakes up gasping for air. She also has struggled with RLS over the past few years and is on Requip, which does seem to help. She occasionally wakes up in the morning with a headache. She denies sleep parasomnias/paralysis, drowsy driving, cataplexy. She goes to bed around 12 am. Takes a while to fall asleep, 1-3 hours, so she takes melatonin to help  with this. Her PCP also put her on gabapentin, which has been helping. She was 2-3 times a night, sometimes to use the restroom. She officially gets up around 10 am.  She is up about 10 pounds over the last 2 years.  She did have a former sleep study in 2005 which did not show any evidence of sleep apnea from what she was told.  She has a history of hypertension.  She is a never smoker.  Does not drink any alcohol.  She is single and lives part of the time on her own and the other time with her mother and father.  She is currently self-employed.  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  Her father has a history of sleep apnea and was previously on CPAP.  Her mother, which is also a patient of our's, has IPF.  From a respiratory standpoint, her breathing has been stable since she was seen last in 2021.  She did have a respiratory virus in the beginning of 2023 and had to have prednisone but otherwise has not required any antibiotics or prednisone since.  She uses Qvar prior to exercise; otherwise, does not usually have to use it.  Denies any wheezing, chest congestion, cough.  07/24/2022: OV with Rebecah Dangerfield NP for follow-up after undergoing home sleep study.  She has mild obstructive sleep apnea with AHI 6.4/h.  Continues to have daytime fatigue symptoms.  Feels like she just does not  sleep that well.  She also occasionally wakes up gasping for air at night.  She has a history of RLS and is on Requip, which does help with her symptoms.  Denies any sleep parasomnia/paralysis, drowsy driving.  No history of narcolepsy or cataplexy.  Previous Epworth was 8, unchanged today.  01/22/2023: Today - follow up Patient presents today for follow up. She has been using an oral appliance she purchased online. She does think it's helping with her snoring and sleep. She still wakes up some throughout the night but attributes this to anxiety. She wants to continue to monitor how she feels. May  consider CPAP in the future but she's okay with her current management. Denies any sleep parasomnias, drowsy driving. She saw Dr. Elly Modena a few weeks ago when she brought her mother, who has IPF, to her clinical trial appointment with [facility]. She was talking to him about her own shortness of breath, which has seemed to be worse over the last 6 months or so. She just feels like her activity tolerance is not what it used to be. She was walking with her sister the other week who commented on how loudly she was breathing. She's still able to play tennis; usually uses her albuterol inhaler beforehand. He recommended she have CT scan due to her family history of IPF. She doesn't have any shortness of breath at rest or when she's walking at a steady pace on level ground. She does have an occasional cough at night; occasionally with a small amount of phlegm that is clear. No cough during the day. No wheezing, chills, hemoptysis, chest congestion, leg swelling, orthopnea, PND, palpitations. She is not using an inhaled steroid any more. Had trouble with insurance coverage. Still uses her albuterol, usually prior to exercise.    Allergies  Allergen Reactions   Allegra [Fexofenadine Hcl] Shortness Of Breath and Rash   Fexofenadine Shortness Of Breath, Rash and Other (See Comments)    Shakes, can't sleep, trouble breathing   Oseltamivir Other (See Comments)    Can't breathe, sick to stomach   Tamiflu [Oseltamivir Phosphate] Itching, Nausea And Vomiting and Other (See Comments)    Can't breathe, sick to stomach   Vancomycin Other (See Comments)    Red man's Syndrome.   Diphenhydramine Other (See Comments)    Shakes, can't sleep, trouble breathing   Latex Itching, Dermatitis and Rash   Sulfamethoxazole Rash   Aspirin Nausea And Vomiting   Clindamycin/Lincomycin Other (See Comments)    Reports it gave her cdiff- required a fecal transplant   Doxycycline    Other     NUT ALLERGY   Diphenhydramine Hcl  Palpitations and Rash   Morphine Other (See Comments)    REACTION: insomnia   Oseltamivir Phosphate Itching and Nausea And Vomiting   Penicillins Itching and Swelling    Can tolerate Amoxicillin OK.   Sulfonamide Derivatives Rash    Immunization History  Administered Date(s) Administered   Influenza,inj,Quad PF,6+ Mos 06/16/2019   Influenza-Unspecified 07/02/2012, 06/26/2013, 07/07/2014   Pneumococcal Polysaccharide-23 02/20/2012   Tdap 02/20/2012, 01/07/2023    Past Medical History:  Diagnosis Date   Anxiety    Arrhythmia    heart races when pt in pain   Asthma    Clostridium difficile infection 2014   Epstein Barr virus infection    Fibromyalgia    GERD (gastroesophageal reflux disease)    History of COVID-19    Hypertension    Hypothyroidism    Immune deficiency disorder (HCC)  Interstitial cystitis    Lyme disease    Mold contact confirmed    Toxic Mold syndrome   Mononucleosis 01/2009   Ovarian cyst    Serous Cystadenofibroma-Left ovary   PONV (postoperative nausea and vomiting)    Shortness of breath    on exertion   Systolic murmur    Thrombophlebitis    Thyroid disease     Tobacco History: Social History   Tobacco Use  Smoking Status Never  Smokeless Tobacco Never   Counseling given: Not Answered   Outpatient Medications Prior to Visit  Medication Sig Dispense Refill   acetaminophen (TYLENOL) 650 MG CR tablet Take by mouth.     albuterol (PROAIR HFA) 108 (90 Base) MCG/ACT inhaler Inhale 2 puffs into the lungs every 6 (six) hours as needed for wheezing or shortness of breath. 18 g 3   ALPRAZolam (XANAX XR) 0.5 MG 24 hr tablet Take 0.5 mg by mouth in the morning and at bedtime. BID     aspirin EC 81 MG tablet Take 81 mg by mouth daily. Swallow whole. Take 2 daily     baclofen (LIORESAL) 20 MG tablet Take 10 mg by mouth as needed.     bisoprolol (ZEBETA) 10 MG tablet Take 10 mg by mouth daily.      buprenorphine (BUTRANS) 15 MCG/HR Place onto the  skin once a week.     cetirizine (ZYRTEC) 10 MG tablet Take 10 mg by mouth daily.      Cholecalciferol (VITAMIN D3 PO) Take 5,000 Int'l Units by mouth daily.     citalopram (CELEXA) 40 MG tablet Take 40 mg by mouth daily.     cloNIDine (CATAPRES) 0.1 MG tablet TAKE 1/2 TABLET BY MOUTH TWICE A DAY 30 tablet 2   fluticasone (FLONASE) 50 MCG/ACT nasal spray      gabapentin (NEURONTIN) 600 MG tablet Take 1 tablet (600 mg total) by mouth 3 (three) times daily. 270 tablet 1   hydrOXYzine (ATARAX/VISTARIL) 25 MG tablet Take 25 mg by mouth as needed for anxiety.      Hyoscyamine Sulfate SL 0.125 MG SUBL Take 0.125 mg by mouth every 4 (four) hours as needed for cramping.     Melatonin 10 MG TABS Take 5 mg by mouth at bedtime.     Multiple Vitamin (MULTI-VITAMIN) tablet Take by mouth.     Probiotic Product (ALIGN PO) Take 1 capsule by mouth as needed.     prochlorperazine (COMPAZINE) 10 MG tablet Take 10 mg by mouth every 6 (six) hours as needed for nausea or vomiting.      promethazine (PHENERGAN) 25 MG tablet Take 25 mg by mouth every 6 (six) hours as needed for nausea or vomiting.      rOPINIRole (REQUIP) 2 MG tablet Take 2 mg by mouth at bedtime.      Spacer/Aero-Holding Chambers (AEROCHAMBER MV) inhaler Use with inhaler 1 each 0   thyroid (ARMOUR) 120 MG tablet Take by mouth.     vitamin B-12 (CYANOCOBALAMIN) 500 MCG tablet Take 500 mcg by mouth daily.     No facility-administered medications prior to visit.     Review of Systems:   Constitutional: No weight gain/loss, night sweats, fevers, chills, or lassitude. + daytime fatigue (improved) HEENT: No headaches, difficulty swallowing, tooth/dental problems, or sore throat. No sneezing, itching, ear ache, nasal congestion, or post nasal drip CV:  No chest pain, orthopnea, PND, swelling in lower extremities, anasarca, dizziness, palpitations, syncope Resp: +shortness of breath with exertion (primarily  with exercise); occasional nocturnal cough.  No excess mucus or change in color of mucus. No hemoptysis. No wheezing.  No chest wall deformity GI:  No heartburn, indigestion, abdominal pain, nausea, vomiting, diarrhea, change in bowel habits, loss of appetite, bloody stools.  GU: No dysuria, change in color of urine, urgency or frequency.  No flank pain, no hematuria  MSK:  No joint pain or swelling.  No decreased range of motion.  No back pain. Neuro: No dizziness or lightheadedness.  Psych: No depression. +anxiety (stable; baseline). No SI/HI. Mood stable.     Physical Exam:  BP 114/70   Pulse 75   Temp 98.8 F (37.1 C) (Oral)   Ht 5\' 1"  (1.549 m)   Wt 213 lb 12.8 oz (97 kg)   LMP 12/11/2022   SpO2 97%   BMI 40.40 kg/m   GEN: Pleasant, interactive, well-appearing; obese; in no acute distress. HEENT:  Normocephalic and atraumatic. PERRLA. Sclera white. Nasal turbinates pink, moist and patent bilaterally. No rhinorrhea present. Oropharynx pink and moist, without exudate or edema. No lesions, ulcerations, or postnasal drip. Mallampati II NECK:  Supple w/ fair ROM. No JVD present. Normal carotid impulses w/o bruits. Thyroid symmetrical with no goiter or nodules palpated. No lymphadenopathy.   CV: RRR, no m/r/g, no peripheral edema. Pulses intact, +2 bilaterally. No cyanosis, pallor or clubbing. PULMONARY:  Unlabored, regular breathing. Clear bilaterally A&P w/o wheezes/rales/rhonchi. No accessory muscle use. No dullness to percussion. GI: BS present and normoactive. Soft, non-tender to palpation.  MSK: No erythema, warmth or tenderness.  Neuro: A/Ox3. No focal deficits noted.   Skin: Warm, no lesions or rashe Psych: Normal affect and behavior. Judgement and thought content appropriate.     Lab Results:  CBC    Component Value Date/Time   WBC 9.9 11/07/2022 1410   WBC 10.2 10/13/2022 1657   RBC 4.63 11/07/2022 1410   HGB 13.4 11/07/2022 1410   HGB 12.7 01/23/2020 1202   HGB 13.9 09/09/2013 1102   HCT 39.6 11/07/2022  1410   HCT 39.5 01/23/2020 1202   HCT 40.9 09/09/2013 1102   PLT 289 11/07/2022 1410   PLT 332 01/23/2020 1202   MCV 85.5 11/07/2022 1410   MCV 88 01/23/2020 1202   MCV 89.1 09/09/2013 1102   MCH 28.9 11/07/2022 1410   MCHC 33.8 11/07/2022 1410   RDW 12.3 11/07/2022 1410   RDW 13.5 01/23/2020 1202   RDW 13.6 09/09/2013 1102   LYMPHSABS 2.7 11/07/2022 1410   LYMPHSABS 3.1 01/23/2020 1202   LYMPHSABS 3.2 09/09/2013 1102   MONOABS 0.5 11/07/2022 1410   MONOABS 0.5 09/09/2013 1102   EOSABS 0.4 11/07/2022 1410   EOSABS 0.4 01/23/2020 1202   BASOSABS 0.0 11/07/2022 1410   BASOSABS 0.1 01/23/2020 1202   BASOSABS 0.1 09/09/2013 1102    BMET    Component Value Date/Time   NA 139 11/07/2022 1410   NA 140 01/23/2020 1202   NA 142 09/09/2013 1102   K 3.9 11/07/2022 1410   K 4.3 09/09/2013 1102   CL 103 11/07/2022 1410   CO2 29 11/07/2022 1410   CO2 27 09/09/2013 1102   GLUCOSE 99 11/07/2022 1410   GLUCOSE 95 09/09/2013 1102   BUN 8 11/07/2022 1410   BUN 12 01/23/2020 1202   BUN 11.0 09/09/2013 1102   CREATININE 0.79 11/07/2022 1410   CREATININE 0.9 09/09/2013 1102   CALCIUM 9.1 11/07/2022 1410   CALCIUM 9.9 09/09/2013 1102   GFRNONAA >60 11/07/2022 1410  GFRAA 92 01/23/2020 1202    BNP No results found for: "BNP"   Imaging:  US Venous Img Upper Right (DVT Study)  Result Date: 01/08/2023 CLINICAL DATA:  Right upper extremity pain and edema. Evaluate for DVT. EXAM: RIGHT UPPER EXTREMITY VENOUS DOPPLER ULTRASOUND TECHNIQUE: Gray-scale sonography with graded compression, as well as color Doppler and duplex ultrasound were performed to evaluate the upper extremity deep venous system from the level of the subclavian vein and including the jugular, axillary, basilic, radial, ulnar and upper cephalic vein. Spectral Doppler was utilized to evaluate flow at rest and with distal augmentation maneuvers. COMPARISON:  None Available. FINDINGS: Contralateral Subclavian Vein: Respiratory  phasicity is normal and symmetric with the symptomatic side. No evidence of thrombus. Normal compressibility. Internal Jugular Vein: No evidence of thrombus. Normal compressibility, respiratory phasicity and response to augmentation. Subclavian Vein: No evidence of thrombus. Normal compressibility, respiratory phasicity and response to augmentation. Axillary Vein: No evidence of thrombus. Normal compressibility, respiratory phasicity and response to augmentation. Cephalic Vein: No evidence of thrombus. Normal compressibility, respiratory phasicity and response to augmentation. Basilic Vein: No evidence of thrombus. Normal compressibility, respiratory phasicity and response to augmentation. Brachial Veins: No evidence of thrombus. Normal compressibility, respiratory phasicity and response to augmentation. Radial Veins: No evidence of thrombus. Normal compressibility, respiratory phasicity and response to augmentation. Ulnar Veins: No evidence of thrombus. Normal compressibility, respiratory phasicity and response to augmentation. Other Findings: Sonographic evaluation of patient's palpable area of concern involving the medial distal aspect of the right upper arm, superior to the elbow correlates with a fairly well-defined hypoechoic nodule which measures approximately 0.4 x 0.4 x 0.3 cm. This hypoechoic nodule is separate from the adjacent basilic vein. IMPRESSION: 1. No evidence of DVT or SVT within the right upper extremity. 2. Sonographic evaluation of patient's palpable area of concern involving the medial distal aspect of the right upper arm, superior to the elbow correlates with a punctate (0.4 cm) hypoechoic nodule, indeterminate though potentially representative of a lymph node, of uncertain though doubtful clinical significance. Electronically Signed   By: Simonne Come M.D.   On: 01/08/2023 13:17         Latest Ref Rng & Units 04/02/2020   12:14 PM  PFT Results  FVC-Pre L 2.59   FVC-Predicted Pre % 80    FVC-Post L 2.65   FVC-Predicted Post % 82   Pre FEV1/FVC % % 82   Post FEV1/FCV % % 82   FEV1-Pre L 2.12   FEV1-Predicted Pre % 81   FEV1-Post L 2.17   DLCO uncorrected ml/min/mmHg 17.26   DLCO UNC% % 89   DLCO corrected ml/min/mmHg 17.26   DLCO COR %Predicted % 89   DLVA Predicted % 97   TLC L 3.90   TLC % Predicted % 84   RV % Predicted % 85     No results found for: "NITRICOXIDE"      Assessment & Plan:   Mild obstructive sleep apnea Very mild OSA. She is currently managed with oral appliance, purchased online. She does she has received benefit from use. Encouraged her to continue monitoring her symptoms. If she has worsening daytime fatigue or sleep quality, could consider trial of CPAP therapy but she would like to refrain from now. Aware of safe driving practices. Encouraged to continue working on healthy weight loss measures.  Patient Instructions  Continue oral appliance at night  Continue working on healthy weight loss measures Let me know if you keep having trouble with  your sleep still and we can look at CPAP    Continue Albuterol inhaler 2 puffs every 6 hours as needed for shortness of breath or wheezing. Notify if symptoms persist despite rescue inhaler/neb use. Continue zyrtec 1 tab daily for allergies  Continue flonase nasal spray 2 sprays each nostril daily  Chest x ray today. We will likely obtain a high resolution CT scan of your chest given family history and your breathing  Repeat pulmonary function testing    Follow up in 6-8 weeks with Dr. Marchelle Gearing after PFTs and CT scan, or Rhunette Croft NP if not available. If symptoms do not improve or worsen, please contact office for sooner follow up or seek emergency care.    DOE (dyspnea on exertion) Possibly due to poorly controlled asthma as she is off ICS now? Does not appear to be in acute exacerbation today and lung exam clear. We will restart her on maintenance ICS therapy - rx sent today. She will  notify if this is not affordable and we will run a test claims. CXR today to rule out acute process. She does have a familial history of IPF. Given this and her progressive DOE, will obtain HRCT chest and repeat PFTs for monitoring. Encouraged her to remain active in interim.   Asthma See above. Action plan in place.   Pneumonia due to COVID-19 virus April 2021. Recovered well. No evidence of post COVID ILD on cardiac CT of the chest. See above plan.    I spent 38 minutes of dedicated to the care of this patient on the date of this encounter to include pre-visit review of records, face-to-face time with the patient discussing conditions above, post visit ordering of testing, clinical documentation with the electronic health record, making appropriate referrals as documented, and communicating necessary findings to members of the patients care team.  Noemi Chapel, NP 01/22/2023  Pt aware and understands NP's role.

## 2023-01-30 ENCOUNTER — Ambulatory Visit
Admission: RE | Admit: 2023-01-30 | Discharge: 2023-01-30 | Disposition: A | Discharge status: Home / Self Care | Payer: Commercial Managed Care - HMO | Source: Ambulatory Visit | Attending: Nurse Practitioner | Admitting: Nurse Practitioner

## 2023-01-30 DIAGNOSIS — Z836 Family history of other diseases of the respiratory system: Secondary | ICD-10-CM

## 2023-01-30 DIAGNOSIS — R0609 Other forms of dyspnea: Secondary | ICD-10-CM

## 2023-02-02 ENCOUNTER — Encounter: Payer: Self-pay | Admitting: Obstetrics & Gynecology

## 2023-02-02 ENCOUNTER — Ambulatory Visit: Payer: Commercial Managed Care - HMO | Admitting: Obstetrics & Gynecology

## 2023-02-02 VITALS — BP 110/74 | HR 65

## 2023-02-02 DIAGNOSIS — Z8742 Personal history of other diseases of the female genital tract: Secondary | ICD-10-CM | POA: Diagnosis not present

## 2023-02-02 DIAGNOSIS — N920 Excessive and frequent menstruation with regular cycle: Secondary | ICD-10-CM | POA: Diagnosis not present

## 2023-02-02 DIAGNOSIS — I809 Phlebitis and thrombophlebitis of unspecified site: Secondary | ICD-10-CM

## 2023-02-02 MED ORDER — NORETHINDRONE 0.35 MG PO TABS: 1.0000 | ORAL_TABLET | Freq: Every day | ORAL | 4 refills | Status: DC

## 2023-02-02 NOTE — Progress Notes (Signed)
    [name] 08-14-1971 [id number]        52 y.o.  G0 Single  RP: Management of Menorrhagia  HPI: Heavy menses every month. No BTB. No pelvic pain.  H/O Endometriosis. Virgin.  Superficial VT left forearm while on Junel 1/20.  Seen by [facility], work-up Negative, no CI to Progestins.   OB History  Gravida Para Term Preterm AB Living  0            SAB IAB Ectopic Multiple Live Births               Past medical history,surgical history, problem list, medications, allergies, family history and social history were all reviewed and documented in the EPIC chart.   Directed ROS with pertinent positives and negatives documented in the history of present illness/assessment and plan.  Exam:  Vitals:   02/02/23 1053  BP: 110/74  Pulse: 65  SpO2: 98%   General appearance:  Normal  Gynecologic exam: Deferred   Assessment/Plan:  52 y.o. G0P0   1. Menorrhagia with regular cycle Heavy menses every month. No BTB. No pelvic pain.  H/O Endometriosis. Virgin.  Superficial VT left forearm while on Junel 1/20.  Seen by [facility], work-up Negative, no CI to Progestins.  Declines the Progesterone IUD given that she is a virgin.  Decision to start on the Progestin-only BCPs.  Benefits/risks/usage reviewed.  Prescription sent to pharmacy.  2. History of endometriosis Will control with the Progestin-only pill.  3. Superficial phlebitis Superficial VT left forearm while on Junel 1/20.  Seen by [facility], work-up Negative, no CI to Progestins.   Other orders - norethindrone (MICRONOR) 0.35 MG tablet; Take 1 tablet (0.35 mg total) by mouth daily.   Genia Del MD, 11:25 AM 02/02/2023

## 2023-03-20 ENCOUNTER — Ambulatory Visit: Payer: Commercial Managed Care - HMO | Admitting: Internal Medicine

## 2023-03-26 ENCOUNTER — Other Ambulatory Visit: Payer: Self-pay | Admitting: Psychiatry

## 2023-03-26 DIAGNOSIS — F4001 Agoraphobia with panic disorder: Secondary | ICD-10-CM

## 2023-03-26 DIAGNOSIS — F5105 Insomnia due to other mental disorder: Secondary | ICD-10-CM

## 2023-03-26 DIAGNOSIS — G2581 Restless legs syndrome: Secondary | ICD-10-CM

## 2023-03-26 DIAGNOSIS — F411 Generalized anxiety disorder: Secondary | ICD-10-CM

## 2023-03-29 ENCOUNTER — Ambulatory Visit: Payer: Commercial Managed Care - HMO | Admitting: Psychiatry

## 2023-04-11 ENCOUNTER — Encounter: Payer: Self-pay | Admitting: Psychiatry

## 2023-04-11 ENCOUNTER — Ambulatory Visit (INDEPENDENT_AMBULATORY_CARE_PROVIDER_SITE_OTHER): Payer: Commercial Managed Care - HMO | Admitting: Psychiatry

## 2023-04-11 DIAGNOSIS — F411 Generalized anxiety disorder: Secondary | ICD-10-CM

## 2023-04-11 DIAGNOSIS — F4001 Agoraphobia with panic disorder: Secondary | ICD-10-CM

## 2023-04-11 DIAGNOSIS — G2581 Restless legs syndrome: Secondary | ICD-10-CM | POA: Diagnosis not present

## 2023-04-11 DIAGNOSIS — F5105 Insomnia due to other mental disorder: Secondary | ICD-10-CM

## 2023-04-11 DIAGNOSIS — G4733 Obstructive sleep apnea (adult) (pediatric): Secondary | ICD-10-CM

## 2023-04-11 MED ORDER — CLONAZEPAM 0.5 MG PO TABS
0.5000 mg | ORAL_TABLET | Freq: Two times a day (BID) | ORAL | 0 refills | Status: DC
Start: 2023-04-11 — End: 2024-01-16

## 2023-04-11 NOTE — Progress Notes (Signed)
[name] [id number] 1970/11/02 52 y.o.  Subjective:   Patient ID:  [name] is a 52 y.o. (DOB 12-26-1970) female.  Chief Complaint:  Chief Complaint  Patient presents with   Follow-up   Anxiety    HPI [name] presents to the office today for follow-up of panic disorder, generalized anxiety, and insomnia.  First seen December 02, 2019. Referred by Dr. Merri Brunette.  She was on alprazolam XR 0.5 mg twice daily and citalopram 40 mg daily along with opiates at the time. Buspirone was initiated.  04/21/2020 appointment with the following noted: Covid and hosp for a week 12/26/19.  Got Remdesivir.  Finally recovering.  Still tired and aches. Steroids helped chronic pain from Lyme dz but relapsed off steroids. On opiates [place] still.  Still on Xanax XR 0.5 mg BID, citalopram 40 mg daily.  Off buspirone bc HA and shakey after a week. Still anxious about Covid.  Has to push herself to leave the house.  No full panic but panicky feelings. Still sees therapist Gretchen Short. Settled worker's comp case from 10 years ago. Celexa does best of SSRI she's taken but not controlled. Normal QT interval now. Trouble with sleep sometimes. Plan: She prefers rispieridone 0.5 mg HS for a week then 1 tablet nightly if needed.  06/23/2020 appointment with the following noted: Too foggy and slowed down on risperidone 0.5 mg after a week's trial. So stopped it. Went to Post-Covid clinic yesterday with good antibodies.   Shouldn't worry over covid but still does.  Life in limbo after settled worker's comp. Exercising, pray, writing and therapy with [employer]. Can awaken with panic every 2-3 days.  Distracts herself. Awoke at 2AM with panic with fear of not breathing.  Anxiety driving.   Chronic fatigue and sleepy before and worse after post Covid  Plan:  Retry gabapentin  800 mg tablet 1/2 tablet QID up to 1 tablet QID  08/23/2020 appointment with the following noted: Increase gabapentin to  800 mg QID.  Works good for 2 hours and then seems to wear off.   NO SE.  Asks about time release.  Helped with panicky feelings and pain.   Hx RLS Anxiety is improved but not gone as noted.  When it runs out still awakens with panic but able to go back to sleep.  04/14/21 appt noted: Hydroxyzine for IC. Wants gabapentin ER and needs PA to get it.  Has taken regular gabapentin in it's place.  Regular gabapentin will not keep her asleep and awakens about 5 AM feeling panicky after 5-6 hours of sleep.  Gabapentin helps anxiety, pain and RLS but inadequate duration.  Has never taken gabapentin ER but wants to take it for greater duration and to stop withdrawal sx from the short acting gabapentin. Gabapentin helps ropinirole help RLS but it wears off as noted.  Gabapentin helps pain with better sleep until it wears off. "Tremendous" dental work repeatedly with dental problems causes anxiety.  [place] was causing dental problems.  3 more surgeries and then will be done.  Tooth pain and gum problems. Not sig depressed.  06/23/21 appt noted: PA denied for ER gabapentin bc not on formulary. Taking gabapentin 800 QID instead. Still wakens at night with anxiety and thinking of all she needs to do.  Worrying for ahwile.  Avg 5-7 hours of sleep.  Family interferes some too. Can awaken with hear racing and feeling panicky. Not depressed but anxious. Plan: Trial mirtazapine 30  09/01/2021 appointment with the following noted: Never got mirtazapine. 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  She does the night shifts with mother and then trouble sleeping  after it.  Parents are gettting better but still anxious.  A lot of stress.  Getting a couple of hours nap and then 4-5  hours at night.  Sleep is still irregular.  Some nights EFA.  Still RLS problems several nights per week when first tries to go to sleep about half the nights. No other med changes.   Asked about a girl in the news.    Plan: Gabapentin 800 QID since can't get horizant Continue citalopram 40 Hold mirtazapine 30 Clonidine 0.1 mg start 1/2 tablet at night and gradually increase to 1/2 in the AM and 1 at night for sleep and anxiety and perimenopausal  sweating off label.  12/26/2021 appointment with the following noted: Pending sleep repeat study bc witnessed loud snoring and periods of apnea and excessive daytime drowsiness regardless of sleep quantity. Witnessed in sleep clinic by staff while there with mother in sleep study.  Health issues..  Will talk to Dr. Renne Crigler. When increased clonidine to 0.1 mg HA so reduced to 0.05 mg HS Still sweats but not as heavy and BP better with clonidine. Helps care for M with pulm fibrosis and hip fx.  Stress with that.  Worry over mother.    03/27/22 appt noted:  Administrative px around getting sleep study.   Wants LA gabapentin to see if can sleep later and also bc short acting gabapentin causes diarrhea and hopes LA would control pain better. Sees pain doc. Anxiiety is not severe but varies with pain. Chronic pain associated with severe muscle pain assoc with history of Lyme dz and chronic abdominal pain.  Went to [employer] [facility] [employer] for 6 mos. Daytime drowsiness. Has to avoid caffeine bc heart racing. Needs to nap. CC sleepy, pain, and anxity M is sick and caring for her.  06/14/22 appt noted: Still waiting for sleep study. Still taking clonidine 0.05 mg BID helps anxiety and BP. Continues citalopram, gabapentin 800 mg TID, hydroxyzine for IC, Xanax XR 0.5 mg BID Still on opiate, Butrans Still sleepy and tired ongoing and predates current meds.  Doesn't feel rested.  Had these sx for years with remote negative sleep study. Had shingles in face and ear.  Still a little pain on side of head. Plan no changes yet.  Pending sleep study  10/03/22 TC  Patient is requesting to titrate down on the Gabapentin . She tried to cut in half without advisement, but she  is not having success.   Pt wants to decrease gabapentin due to doing some research and finding that it can contribute to bladder problems ,which she already has.Also she noticed that she is "not as sharp."She tried taking a half tab and it made her shaky,have headaches,and her pain level was out of control.    MD resp:  Yes she can trigger withdrawal symptoms if she drops the dose of gabapentin too much at 1 time.  But it is not a difficult medicine to reduce if it is done gradually.  I sent in a prescription for 600 mg tablet instead of the 800 mg tablet and she takes 1 4 times a day and that transition should be fairly easy but give it about a week for her body to adjust.     11/27/22 appt noted: Psych med: Xanax XR 0.5  BID, gabapentin 600 TID, citalopram 40, clonidine 0.1 HS, hydroxyzine 25 prn, ropinirole 2 HS Had WD with less gabapentin.  At 600 TID.  Was wanting to reduce bc cognitive fog.  Forgetting names and thought gabapentin could be related. Does feel sharper with lower dose. Rx for pain and anxiety and did help anxiety.   Anxiety has crept up some with less gabapentin.   Sleep is pretty good.  Staying up later so can stay asleep.  Used to wake up in panic.   Struggling to lose wt.  Afraid to take Ozempic.  M pulm fiibrosis. Would like to later try reducing gabapentin.  04/11/23 appt noted: Psych med: Xanax XR 0.5 BID, gabapentin 600 TID, citalopram 40, clonidine 0.1 HS, hydroxyzine 25 prn, ropinirole 2 HS PCP Dr. Renne Crigler and then has alternative doctor with low cortisol and worries about it.  Her CRP is still high. Chronic anxiety.  Wonders if lab abnl could cause her ongoing anxiety.  Asks about adrenal fatigue from chronic anxiety.   Still awakens a lot.   Gabapentin 600 TID helped anxiety and sleep more at higher dose but had cog SE.  So reduced to BID.    Feels tired during the day. Therapist Dr. Buena Irish. Takes buprenorphine for all over pain, muscle pain from history Lyme Dz.  [facility]. RLS controlled with med.   Chronically sleepy during the day  Past Psychiatric History: therapist [facility] Dr. Marijo File. Dr. Larene Beach [facility] Past Psychiatric Medication Trials:  Zoloft SE HA and itching,  Paxil wt gain 30# and fatigue,  Fluoxetine NR,  Lexapro  not as good as Celexa Cymbalta SE dryness dental problems, no pain benefit Gabapentin NR pain midrange, Horizant Gabatril for sleep 16 mg   Alprazolam 1 mg BID,  Xanax XR  Buspirone SE HA  Risperidone SE Clonidine 0.1  Trazodone, NR Ambien and Lunesta NR Mirtazapine 30 never taken  Review of Systems:  Review of Systems  Constitutional:  Positive for fatigue.  HENT:  Positive for dental problem.   Respiratory:  Positive for shortness of breath.   Cardiovascular:  Negative for chest pain and palpitations.  Gastrointestinal:  Positive for nausea.  Musculoskeletal:  Positive for myalgias.  Neurological:  Positive for weakness. Negative for dizziness and tremors.       Restless legs  Psychiatric/Behavioral:  Positive for sleep disturbance. The patient is nervous/anxious.     Medications: I have reviewed the patient's current medications.  Current Outpatient Medications  Medication Sig Dispense Refill   acetaminophen (TYLENOL) 650 MG CR tablet Take by mouth.     albuterol (PROAIR HFA) 108 (90 Base) MCG/ACT inhaler Inhale 2 puffs into the lungs every 6 (six) hours as needed for wheezing or shortness of breath. 18 g 3   baclofen (LIORESAL) 20 MG tablet Take 10 mg by mouth as needed.     bisoprolol (ZEBETA) 10 MG tablet Take 10 mg by mouth daily.      buprenorphine (BUTRANS) 15 MCG/HR Place onto the skin once a week.     cetirizine (ZYRTEC) 10 MG tablet Take 10 mg by mouth daily.      Cholecalciferol (VITAMIN D3 PO) Take 5,000 Int'l Units by mouth daily.     citalopram (CELEXA) 40 MG tablet Take 40 mg by mouth daily.     clonazePAM (KLONOPIN) 0.5 MG tablet Take 1 tablet (0.5 mg  total) by mouth 2 (two) times daily. 60 tablet 0   cloNIDine (CATAPRES) 0.1 MG tablet  TAKE 1/2 TABLET TWICE A DAY BY MOUTH 30 tablet 0   fluticasone (FLONASE) 50 MCG/ACT nasal spray      Fluticasone Furoate (ARNUITY ELLIPTA) 50 MCG/ACT AEPB Inhale 1 puff into the lungs daily. 30 each 5   gabapentin (NEURONTIN) 600 MG tablet Take 1 tablet (600 mg total) by mouth 3 (three) times daily. (Patient taking differently: Take 600 mg by mouth 2 (two) times daily.) 270 tablet 1   hydrOXYzine (ATARAX/VISTARIL) 25 MG tablet Take 25 mg by mouth as needed for anxiety.      Hyoscyamine Sulfate SL 0.125 MG SUBL Take 0.125 mg by mouth every 4 (four) hours as needed for cramping.     Melatonin 10 MG TABS Take 5 mg by mouth at bedtime.     Multiple Vitamin (MULTI-VITAMIN) tablet Take by mouth.     norethindrone (MICRONOR) 0.35 MG tablet Take 1 tablet (0.35 mg total) by mouth daily. 84 tablet 4   prochlorperazine (COMPAZINE) 10 MG tablet Take 10 mg by mouth every 6 (six) hours as needed for nausea or vomiting.      promethazine (PHENERGAN) 25 MG tablet Take 25 mg by mouth every 6 (six) hours as needed for nausea or vomiting.      rOPINIRole (REQUIP) 2 MG tablet Take 2 mg by mouth at bedtime.      Spacer/Aero-Holding Chambers (AEROCHAMBER MV) inhaler Use with inhaler 1 each 0   thyroid (ARMOUR) 120 MG tablet Take by mouth.     vitamin B-12 (CYANOCOBALAMIN) 500 MCG tablet Take 500 mcg by mouth daily.     No current facility-administered medications for this visit.    Medication Side Effects: None  Allergies:  Allergies  Allergen Reactions   Allegra [Fexofenadine Hcl] Shortness Of Breath and Rash   Fexofenadine Shortness Of Breath, Rash and Other (See Comments)    Shakes, can't sleep, trouble breathing   Oseltamivir Other (See Comments)    Can't breathe, sick to stomach   Tamiflu [Oseltamivir Phosphate] Itching, Nausea And Vomiting and Other (See Comments)    Can't breathe, sick to stomach   Vancomycin Other  (See Comments)    Red man's Syndrome.   Diphenhydramine Other (See Comments)    Shakes, can't sleep, trouble breathing   Latex Itching, Dermatitis and Rash   Sulfamethoxazole Rash   Aspirin Nausea And Vomiting   Clindamycin/Lincomycin Other (See Comments)    Reports it gave her cdiff- required a fecal transplant   Doxycycline    Other     NUT ALLERGY   Diphenhydramine Hcl Palpitations and Rash   Morphine Other (See Comments)    REACTION: insomnia   Oseltamivir Phosphate Itching and Nausea And Vomiting   Peanut-Containing Drug Products Rash   Penicillins Itching and Swelling    Can tolerate Amoxicillin OK.   Sulfonamide Derivatives Rash    Past Medical History:  Diagnosis Date   Anxiety    Arrhythmia    heart races when pt in pain   Asthma    Clostridium difficile infection 2014   Epstein Barr virus infection    Fibromyalgia    GERD (gastroesophageal reflux disease)    History of COVID-19    Hypertension    Hypothyroidism    Immune deficiency disorder (HCC)    Interstitial cystitis    Lyme disease    Mold contact confirmed    Toxic Mold syndrome   Mononucleosis 01/2009   Ovarian cyst    Serous Cystadenofibroma-Left ovary   PONV (postoperative nausea and vomiting)  Shortness of breath    on exertion   Systolic murmur    Thrombophlebitis    Thyroid disease     Family History  Problem Relation Age of Onset   Rheum arthritis Mother    Hypertension Mother    Diabetes Mother    Pulmonary embolism Mother        x2   Heart disease Mother    Other Mother        factor 5   Pulmonary fibrosis Mother    Hypertension Father    Thyroid disease Father    Breast cancer Maternal Aunt        Age 20   Pancreatic cancer Maternal Grandmother    Heart disease Maternal Grandfather    Breast cancer Paternal Grandmother        Age 74   Asthma Paternal Grandmother     Social History   Socioeconomic History   Marital status: Single    Spouse name: Not on file    Number of children: 0   Years of education: college   Highest education level: Not on file  Occupational History   Occupation: Runner, broadcasting/film/video  Tobacco Use   Smoking status: Never   Smokeless tobacco: Never  Vaping Use   Vaping status: Never Used  Substance and Sexual Activity   Alcohol use: No    Alcohol/week: 0.0 standard drinks of alcohol   Drug use: No   Sexual activity: Never    Partners: Male    Birth control/protection: Abstinence    Comment: never sexually active  Other Topics Concern   Not on file  Social History Narrative   Lives at home alone.   Right-handed.   Caffeine use: 1 cup per day.   Social Determinants of Health   Financial Resource Strain: Not on file  Food Insecurity: No Food Insecurity (11/04/2022)   Hunger Vital Sign    Worried About Running Out of Food in the Last Year: Never true    Ran Out of Food in the Last Year: Never true  Transportation Needs: No Transportation Needs (11/04/2022)   PRAPARE - Administrator, Civil Service (Medical): No    Lack of Transportation (Non-Medical): No  Physical Activity: Not on file  Stress: Not on file  Social Connections: Unknown (01/05/2022)   [facility]   Social Network    Social Network: Not on file  Intimate Partner Violence: Not At Risk (11/04/2022)   Humiliation, Afraid, Rape, and Kick questionnaire    Fear of Current or Ex-Partner: No    Emotionally Abused: No    Physically Abused: No    Sexually Abused: No    Past Medical History, Surgical history, Social history, and Family history were reviewed and updated as appropriate.   Please see review of systems for further details on the patient's review from today.   Objective:   Physical Exam:  There were no vitals taken for this visit.  Physical Exam Constitutional:      General: She is not in acute distress.    Appearance: She is obese.  Musculoskeletal:        General: No deformity.  Neurological:     Mental  Status: She is alert and oriented to person, place, and time.     Coordination: Coordination normal.  Psychiatric:        Attention and Perception: Attention and perception normal. She does not perceive auditory or visual hallucinations.  Mood and Affect: Mood is anxious. Mood is not depressed. Affect is not labile, blunt, tearful or inappropriate.        Speech: Speech normal. Speech is not slurred.        Behavior: Behavior normal. Behavior is not slowed.        Thought Content: Thought content normal. Thought content is not paranoid or delusional. Thought content does not include homicidal or suicidal ideation. Thought content does not include suicidal plan.        Cognition and Memory: Cognition and memory normal.        Judgment: Judgment normal.     Comments: Insight fair Chronic stress and anxiety ongoing including about meds  anxious questions continue     Lab Review:     Component Value Date/Time   NA 139 11/07/2022 1410   NA 140 01/23/2020 1202   NA 142 09/09/2013 1102   K 3.9 11/07/2022 1410   K 4.3 09/09/2013 1102   CL 103 11/07/2022 1410   CO2 29 11/07/2022 1410   CO2 27 09/09/2013 1102   GLUCOSE 99 11/07/2022 1410   GLUCOSE 95 09/09/2013 1102   BUN 8 11/07/2022 1410   BUN 12 01/23/2020 1202   BUN 11.0 09/09/2013 1102   CREATININE 0.79 11/07/2022 1410   CREATININE 0.9 09/09/2013 1102   CALCIUM 9.1 11/07/2022 1410   CALCIUM 9.9 09/09/2013 1102   PROT 7.4 11/07/2022 1410   PROT 7.1 01/23/2020 1202   PROT 7.9 09/09/2013 1102   ALBUMIN 4.1 11/07/2022 1410   ALBUMIN 4.2 01/23/2020 1202   ALBUMIN 3.5 09/09/2013 1102   AST 18 11/07/2022 1410   AST 19 09/09/2013 1102   ALT 14 11/07/2022 1410   ALT 18 09/09/2013 1102   ALKPHOS 60 11/07/2022 1410   ALKPHOS 80 09/09/2013 1102   BILITOT 0.8 11/07/2022 1410   BILITOT 0.53 09/09/2013 1102   GFRNONAA >60 11/07/2022 1410   GFRAA 92 01/23/2020 1202       Component Value Date/Time   WBC 9.9 11/07/2022 1410    WBC 10.2 10/13/2022 1657   RBC 4.63 11/07/2022 1410   HGB 13.4 11/07/2022 1410   HGB 12.7 01/23/2020 1202   HGB 13.9 09/09/2013 1102   HCT 39.6 11/07/2022 1410   HCT 39.5 01/23/2020 1202   HCT 40.9 09/09/2013 1102   PLT 289 11/07/2022 1410   PLT 332 01/23/2020 1202   MCV 85.5 11/07/2022 1410   MCV 88 01/23/2020 1202   MCV 89.1 09/09/2013 1102   MCH 28.9 11/07/2022 1410   MCHC 33.8 11/07/2022 1410   RDW 12.3 11/07/2022 1410   RDW 13.5 01/23/2020 1202   RDW 13.6 09/09/2013 1102   LYMPHSABS 2.7 11/07/2022 1410   LYMPHSABS 3.1 01/23/2020 1202   LYMPHSABS 3.2 09/09/2013 1102   MONOABS 0.5 11/07/2022 1410   MONOABS 0.5 09/09/2013 1102   EOSABS 0.4 11/07/2022 1410   EOSABS 0.4 01/23/2020 1202   BASOSABS 0.0 11/07/2022 1410   BASOSABS 0.1 01/23/2020 1202   BASOSABS 0.1 09/09/2013 1102    No results found for: "POCLITH", "LITHIUM"   No results found for: "PHENYTOIN", "PHENOBARB", "VALPROATE", "CBMZ"   .res Assessment: Plan:    [name] was seen today for follow-up and anxiety.  Diagnoses and all orders for this visit:  Generalized anxiety disorder -     clonazePAM (KLONOPIN) 0.5 MG tablet; Take 1 tablet (0.5 mg total) by mouth 2 (two) times daily.  Panic disorder with agoraphobia -     clonazePAM (KLONOPIN)  0.5 MG tablet; Take 1 tablet (0.5 mg total) by mouth 2 (two) times daily.  Restless legs syndrome  Mild obstructive sleep apnea  Insomnia due to mental condition    Anxiety is not sig better than last time with partial response to current meds:  . Intolerant of buspirone, and risperidone.  hx [facility] at Dr. Carolee Rota office.  Patient is also on opiates; buprenorphine.   Disc risk with Xanax.  We discussed the short-term risks associated with benzodiazepines including sedation and increased fall risk among others.  Discussed long-term side effect risk including dependence, potential withdrawal symptoms, and the potential eventual dose-related risk of dementia.  But  recent studies from 2020 dispute this association between benzodiazepines and dementia risk. Newer studies in 2020 do not support an association with dementia.   Options risperidone off label, switch citalopram to Viibryd, Luvox, off label Trileptal, venlafaxine, Abilify, higher than 40 citalopram but EKG 01/14/20 QTc 512 so cannot increase citalopram. Also hesitant to use TCA for the same reason. She's afraid of change Option switch Xnax XR to clonazepam 0.5 mg BID  Gabapentin 600 BID had to reduce bc cog SE at higher dose DC alprazolam XR 0.5 mg twice daily and  Trial clonzepam 0.5 mg  BID instead.  Continue citalopram 40 Clonidine 0.1 mg 1/2 tablet twice daily prn sweats and anxiety off label.  Mild sleep apnea Rx dental appliance  (NAC) N-Acetylcysteine 2 of the  600 mg capsules daily to help with mild cognitive problems.  It can be combined with a B-complex vitamin as the B-12 and folate which can sometimes enhance the effect.  Consider modafinil if OSA  FU 2 mos  Meredith Staggers, MD, DFAPA   Please see After Visit Summary for patient specific instructions.  Future Appointments  Date Time Provider Department Center  06/06/2023  3:00 PM [facility] None  06/06/2023  4:00 PM Cobb, Ruby Cola, NP [facility] None  08/07/2023  4:00 PM Chrzanowski, Jami B, NP [facility] None    No orders of the defined types were placed in this encounter.    -------------------------------

## 2023-04-18 ENCOUNTER — Encounter: Payer: Self-pay | Admitting: Family Medicine

## 2023-04-19 ENCOUNTER — Other Ambulatory Visit: Payer: Self-pay | Admitting: Psychiatry

## 2023-04-19 DIAGNOSIS — F411 Generalized anxiety disorder: Secondary | ICD-10-CM

## 2023-04-19 DIAGNOSIS — F4001 Agoraphobia with panic disorder: Secondary | ICD-10-CM

## 2023-04-19 DIAGNOSIS — G2581 Restless legs syndrome: Secondary | ICD-10-CM

## 2023-04-19 DIAGNOSIS — F5105 Insomnia due to other mental disorder: Secondary | ICD-10-CM

## 2023-06-06 ENCOUNTER — Ambulatory Visit: Payer: Commercial Managed Care - HMO | Admitting: Nurse Practitioner

## 2023-06-13 ENCOUNTER — Ambulatory Visit: Payer: 59 | Admitting: Psychiatry

## 2023-07-04 ENCOUNTER — Ambulatory Visit: Payer: Commercial Managed Care - HMO | Admitting: Nurse Practitioner

## 2023-07-17 ENCOUNTER — Other Ambulatory Visit: Payer: Self-pay | Admitting: Psychiatry

## 2023-07-17 DIAGNOSIS — F411 Generalized anxiety disorder: Secondary | ICD-10-CM

## 2023-07-17 DIAGNOSIS — F5105 Insomnia due to other mental disorder: Secondary | ICD-10-CM

## 2023-07-17 DIAGNOSIS — F4001 Agoraphobia with panic disorder: Secondary | ICD-10-CM

## 2023-07-17 DIAGNOSIS — G2581 Restless legs syndrome: Secondary | ICD-10-CM

## 2023-07-17 NOTE — Telephone Encounter (Signed)
Pls schedule pt appt

## 2023-08-06 ENCOUNTER — Ambulatory Visit: Payer: Commercial Managed Care - HMO | Admitting: Obstetrics & Gynecology

## 2023-08-07 ENCOUNTER — Ambulatory Visit: Payer: Commercial Managed Care - HMO | Admitting: Radiology

## 2023-08-08 ENCOUNTER — Other Ambulatory Visit: Payer: Self-pay | Admitting: Psychiatry

## 2023-08-08 DIAGNOSIS — G2581 Restless legs syndrome: Secondary | ICD-10-CM

## 2023-08-08 DIAGNOSIS — F5105 Insomnia due to other mental disorder: Secondary | ICD-10-CM

## 2023-08-08 DIAGNOSIS — F411 Generalized anxiety disorder: Secondary | ICD-10-CM

## 2023-08-08 DIAGNOSIS — F4001 Agoraphobia with panic disorder: Secondary | ICD-10-CM

## 2023-09-07 NOTE — Telephone Encounter (Signed)
 Appt made next avail 11/07/23

## 2023-09-10 ENCOUNTER — Ambulatory Visit: Payer: Commercial Managed Care - HMO | Admitting: Nurse Practitioner

## 2023-10-02 ENCOUNTER — Ambulatory Visit: Payer: Commercial Managed Care - HMO | Admitting: Radiology

## 2023-10-12 ENCOUNTER — Emergency Department (HOSPITAL_BASED_OUTPATIENT_CLINIC_OR_DEPARTMENT_OTHER)
Admission: EM | Admit: 2023-10-12 | Discharge: 2023-10-12 | Disposition: A | Discharge status: Home / Self Care | Payer: Commercial Managed Care - HMO | Attending: Emergency Medicine | Admitting: Emergency Medicine

## 2023-10-12 ENCOUNTER — Other Ambulatory Visit: Payer: Self-pay

## 2023-10-12 DIAGNOSIS — Z79899 Other long term (current) drug therapy: Secondary | ICD-10-CM | POA: Diagnosis not present

## 2023-10-12 DIAGNOSIS — Z91040 Latex allergy status: Secondary | ICD-10-CM | POA: Diagnosis not present

## 2023-10-12 DIAGNOSIS — Z20822 Contact with and (suspected) exposure to covid-19: Secondary | ICD-10-CM | POA: Diagnosis not present

## 2023-10-12 DIAGNOSIS — I10 Essential (primary) hypertension: Secondary | ICD-10-CM | POA: Insufficient documentation

## 2023-10-12 DIAGNOSIS — J45909 Unspecified asthma, uncomplicated: Secondary | ICD-10-CM | POA: Diagnosis not present

## 2023-10-12 DIAGNOSIS — J101 Influenza due to other identified influenza virus with other respiratory manifestations: Secondary | ICD-10-CM | POA: Diagnosis not present

## 2023-10-12 DIAGNOSIS — Z91010 Allergy to peanuts: Secondary | ICD-10-CM | POA: Diagnosis not present

## 2023-10-12 DIAGNOSIS — R059 Cough, unspecified: Secondary | ICD-10-CM | POA: Diagnosis present

## 2023-10-12 LAB — RESP PANEL BY RT-PCR (RSV, FLU A&B, COVID)  RVPGX2
Influenza A by PCR: POSITIVE — AB
Influenza B by PCR: NEGATIVE
Resp Syncytial Virus by PCR: NEGATIVE
SARS Coronavirus 2 by RT PCR: NEGATIVE

## 2023-10-12 MED ORDER — ALBUTEROL SULFATE HFA 108 (90 BASE) MCG/ACT IN AERS
2.0000 | INHALATION_SPRAY | RESPIRATORY_TRACT | Status: DC | PRN
  Administered 2023-10-12: 2 via RESPIRATORY_TRACT
  Filled 2023-10-12: qty 6.7

## 2023-10-12 MED ORDER — PREDNISONE 20 MG PO TABS: 40.0000 mg | ORAL_TABLET | Freq: Every day | ORAL | 0 refills | Status: DC

## 2023-10-12 NOTE — ED Triage Notes (Addendum)
 Pt POV from home reporting flu like sx since Monday, sick family members in the home. Also reports wheezing, hx asthma, using inhaler with minimal improvement. Respirations even and unlabored at this time.

## 2023-10-12 NOTE — Discharge Instructions (Addendum)
 Drink plenty fluids and get lots of rest.  Use your inhaler as needed.

## 2023-10-12 NOTE — ED Notes (Signed)

## 2023-10-12 NOTE — ED Provider Notes (Signed)
 [place] EMERGENCY DEPARTMENT AT [facility] Provider Note   CSN: [id number] Arrival date & time: 10/12/23  1531     History  Chief Complaint  Patient presents with   URI    [name] is a 53 y.o. female.  Patient is a 53 year old female with a history of asthma, fibromyalgia, hypertension, thyroid  disease who is presenting today with complaint of 4 days of chest pain, cough, nasal congestion, myalgias, malaise, headaches.  She has been around multiple kids who have had fifths disease last week as well.  She also feels like her asthma has been flared up and she has had more wheezing and shortness of breath.  When she was checking her oxygen at home it was 91%.  The history is provided by the patient.  URI      Home Medications Prior to Admission medications   Medication Sig Start Date End Date Taking? Authorizing Provider  predniSONE  (DELTASONE ) 20 MG tablet Take 2 tablets (40 mg total) by mouth daily. 10/12/23  Yes Meena Barrantes, [place], MD  acetaminophen  (TYLENOL ) 650 MG CR tablet Take by mouth.    [provider]  albuterol  (PROAIR  HFA) 108 (90 Base) MCG/ACT inhaler Inhale 2 puffs into the lungs every 6 (six) hours as needed for wheezing or shortness of breath. 11/24/19   Darlean Ozell NOVAK, MD  baclofen (LIORESAL) 20 MG tablet Take 10 mg by mouth as needed. 05/23/18   [provider]  bisoprolol  (ZEBETA ) 10 MG tablet Take 10 mg by mouth daily.     [provider]  buprenorphine  (BUTRANS ) 15 MCG/HR Place onto the skin once a week.    [provider]  cetirizine (ZYRTEC) 10 MG tablet Take 10 mg by mouth daily.     [provider]  Cholecalciferol (VITAMIN D3 PO) Take 5,000 Int'l Units by mouth daily.    [provider]  citalopram  (CELEXA ) 40 MG tablet Take 40 mg by mouth daily.    [provider]  clonazePAM  (KLONOPIN ) 0.5 MG tablet Take 1 tablet (0.5 mg total) by mouth 2 (two) times daily. 04/11/23   Cottle,  Lorene KANDICE Raddle., MD  cloNIDine  (CATAPRES ) 0.1 MG tablet TAKE 1/2 TABLET TWICE A DAY BY MOUTH 08/08/23   Cottle, Lorene KANDICE Raddle., MD  fluticasone [facility]) 50 MCG/ACT nasal spray     [provider]  Fluticasone Furoate  (ARNUITY ELLIPTA ) 50 MCG/ACT AEPB Inhale 1 puff into the lungs daily. 01/22/23   Cobb, Comer GAILS, NP  gabapentin  (NEURONTIN ) 600 MG tablet Take 1 tablet (600 mg total) by mouth 3 (three) times daily. Patient taking differently: Take 600 mg by mouth 2 (two) times daily. 11/27/22   Cottle, Lorene KANDICE Raddle., MD  hydrOXYzine  (ATARAX /VISTARIL ) 25 MG tablet Take 25 mg by mouth as needed for anxiety.  08/19/17   [provider]  Hyoscyamine Sulfate SL 0.125 MG SUBL Take 0.125 mg by mouth every 4 (four) hours as needed for cramping. 05/23/18   [provider]  Melatonin 10 MG TABS Take 5 mg by mouth at bedtime.    [provider]  Multiple Vitamin (MULTI-VITAMIN) tablet Take by mouth.    [provider]  norethindrone  (MICRONOR ) 0.35 MG tablet Take 1 tablet (0.35 mg total) by mouth daily. 02/02/23   Lavoie, Marie-Lyne, MD  prochlorperazine (COMPAZINE) 10 MG tablet Take 10 mg by mouth every 6 (six) hours as needed for nausea or vomiting.     [provider]  promethazine  (PHENERGAN ) 25 MG tablet  Take 25 mg by mouth every 6 (six) hours as needed for nausea or vomiting.     [provider]  rOPINIRole  (REQUIP ) 2 MG tablet Take 2 mg by mouth at bedtime.     [provider]  Spacer/Aero-Holding Chambers (AEROCHAMBER MV) inhaler Use with inhaler 07/24/22   Cobb, Comer GAILS, NP  thyroid  (ARMOUR) 120 MG tablet Take by mouth. 07/01/13   [provider]  vitamin B-12 (CYANOCOBALAMIN ) 500 MCG tablet Take 500 mcg by mouth daily.    [provider]      Allergies    Allegra [fexofenadine hcl], Fexofenadine, Oseltamivir, Tamiflu [oseltamivir phosphate], Vancomycin, Diphenhydramine , Latex, Sulfamethoxazole, Aspirin ,  Clindamycin /lincomycin, Doxycycline, Other, Diphenhydramine  hcl, Morphine, Oseltamivir phosphate, Peanut-containing drug products, Penicillins, and Sulfonamide derivatives    Review of Systems   Review of Systems  Physical Exam Updated Vital Signs BP (!) 151/95 (BP Location: Right Arm)   Pulse 78   Temp 98.1 F (36.7 C) (Oral)   Resp 16   Ht 5' 1 (1.549 m)   Wt 95.3 kg   LMP 09/10/2023 (Approximate)   SpO2 95%   BMI 39.68 kg/m  Physical Exam Vitals and nursing note reviewed.  Constitutional:      General: She is not in acute distress.    Appearance: She is well-developed.  HENT:     Head: Normocephalic and atraumatic.     Right Ear: Tympanic membrane normal.     Left Ear: Tympanic membrane normal.  Eyes:     Pupils: Pupils are equal, round, and reactive to light.  Cardiovascular:     Rate and Rhythm: Normal rate and regular rhythm.     Heart sounds: Normal heart sounds. No murmur heard.    No friction rub.  Pulmonary:     Effort: Pulmonary effort is normal.     Breath sounds: Normal breath sounds. No wheezing or rales.  Musculoskeletal:        General: No tenderness. Normal range of motion.     Comments: No edema  Skin:    General: Skin is warm and dry.     Findings: No rash.  Neurological:     Mental Status: She is alert and oriented to person, place, and time.     Cranial Nerves: No cranial nerve deficit.  Psychiatric:        Behavior: Behavior normal.     ED Results / Procedures / Treatments   Labs (all labs ordered are listed, but only abnormal results are displayed) Labs Reviewed  RESP PANEL BY RT-PCR (RSV, FLU A&B, COVID)  RVPGX2 - Abnormal; Notable for the following components:      Result Value   Influenza A by PCR POSITIVE (*)    All other components within normal limits    EKG None  Radiology No results found.  Procedures Procedures    Medications Ordered in ED Medications  albuterol  (VENTOLIN  HFA) 108 (90 Base) MCG/ACT inhaler 2 puff  (has no administration in time range)    ED Course/ Medical Decision Making/ A&P                                 Medical Decision Making Risk Prescription drug management.   Pt with symptoms consistent with influenza.  Normal exam here but is febrile.  No signs of breathing difficulty  No signs of strep pharyngitis, otitis or abnormal abdominal findings.   Viral panel was positive for flu  A.  Currently patient has no wheezing and sats are 95% on room air.  She was given a new inhaler as hers was almost out and a prescription for prednisone  to start if she felt like her wheezing was getting worse.  Will continue antipyretica and rest and fluids and return for any further problems.         Final Clinical Impression(s) / ED Diagnoses Final diagnoses:  Influenza A    Rx / DC Orders ED Discharge Orders          Ordered    predniSONE  (DELTASONE ) 20 MG tablet  Daily        10/12/23 1720              Doretha Folks, MD 10/12/23 1723

## 2023-11-07 ENCOUNTER — Other Ambulatory Visit: Payer: Self-pay | Admitting: Psychiatry

## 2023-11-07 ENCOUNTER — Ambulatory Visit: Payer: 59 | Admitting: Psychiatry

## 2023-11-07 DIAGNOSIS — F411 Generalized anxiety disorder: Secondary | ICD-10-CM

## 2023-11-07 DIAGNOSIS — G2581 Restless legs syndrome: Secondary | ICD-10-CM

## 2023-11-07 DIAGNOSIS — F4001 Agoraphobia with panic disorder: Secondary | ICD-10-CM

## 2023-11-07 DIAGNOSIS — F5105 Insomnia due to other mental disorder: Secondary | ICD-10-CM

## 2023-11-08 ENCOUNTER — Ambulatory Visit: Payer: Commercial Managed Care - HMO | Admitting: Radiology

## 2023-12-14 ENCOUNTER — Ambulatory Visit: Payer: Commercial Managed Care - HMO | Admitting: Radiology

## 2023-12-14 ENCOUNTER — Encounter: Payer: Self-pay | Admitting: Radiology

## 2023-12-14 ENCOUNTER — Other Ambulatory Visit (HOSPITAL_COMMUNITY)
Admission: RE | Admit: 2023-12-14 | Discharge: 2023-12-14 | Disposition: A | Discharge status: Home / Self Care | Source: Ambulatory Visit | Attending: Radiology | Admitting: Radiology

## 2023-12-14 VITALS — BP 136/84 | HR 81 | Ht 61.75 in | Wt 210.8 lb

## 2023-12-14 DIAGNOSIS — N920 Excessive and frequent menstruation with regular cycle: Secondary | ICD-10-CM | POA: Diagnosis not present

## 2023-12-14 DIAGNOSIS — Z01419 Encounter for gynecological examination (general) (routine) without abnormal findings: Secondary | ICD-10-CM | POA: Diagnosis present

## 2023-12-14 DIAGNOSIS — Z1331 Encounter for screening for depression: Secondary | ICD-10-CM

## 2023-12-14 MED ORDER — NORETHINDRONE 0.35 MG PO TABS
1.0000 | ORAL_TABLET | Freq: Every day | ORAL | 4 refills | Status: AC
Start: 2023-12-14 — End: ?

## 2023-12-14 NOTE — Progress Notes (Signed)
 [name] 1970/10/16 [id number]   History:  53 y.o. G0 presents for annual exam. No gyn concerns. Doing well on POPs for menorrhagia.  Gynecologic History Patient's last menstrual period was 12/07/2023 (approximate). Period Cycle (Days): 28 Period Duration (Days): 6 Period Pattern: Regular Menstrual Flow: Heavy Menstrual Control: Thin pad, Maxi pad Dysmenorrhea: (!) Severe Dysmenorrhea Symptoms: Cramping Sexually active: never Last Pap: 2021. Results were: normal Last mammogram: 10/2023. Results were: normal  Obstetric History OB History  Gravida Para Term Preterm AB Living  0       SAB IAB Ectopic Multiple Live Births             12/14/2023    1:44 PM 11/04/2022   11:26 AM 01/19/2020    3:22 PM  Depression screen PHQ 2/9  Decreased Interest 0 1 0  Down, Depressed, Hopeless 0 0 0  PHQ - 2 Score 0 1 0     The following portions of the patient's history were reviewed and updated as appropriate: allergies, current medications, past family history, past medical history, past social history, past surgical history, and problem list.  Review of Systems  All other systems reviewed and are negative.   Past medical history, past surgical history, family history and social history were all reviewed and documented in the EPIC chart.  Exam:  Vitals:   12/14/23 1340  BP: 136/84  Pulse: 81  SpO2: 98%  Weight: 210 lb 12.8 oz (95.6 kg)  Height: 5' 1.75" (1.568 m)   Body mass index is 38.87 kg/m.  Physical Exam Vitals and nursing note reviewed. Exam conducted with a chaperone present.  Constitutional:      Appearance: Normal appearance. She is normal weight.  HENT:     Head: Normocephalic and atraumatic.  Neck:     Thyroid: No thyroid mass, thyromegaly or thyroid tenderness.  Cardiovascular:     Rate and Rhythm: Regular rhythm.     Heart sounds: Normal heart sounds.  Pulmonary:     Effort: Pulmonary effort is normal.     Breath sounds: Normal breath sounds.   Chest:  Breasts:    Breasts are symmetrical.     Right: Normal. No inverted nipple, mass, nipple discharge, skin change or tenderness.     Left: Normal. No inverted nipple, mass, nipple discharge, skin change or tenderness.  Abdominal:     General: Abdomen is flat. Bowel sounds are normal.     Palpations: Abdomen is soft.  Genitourinary:    General: Normal vulva.     Vagina: Normal. No vaginal discharge, bleeding or lesions.     Cervix: Normal. No discharge or lesion.     Uterus: Normal. Not enlarged and not tender.      Adnexa: Right adnexa normal and left adnexa normal.       Right: No mass, tenderness or fullness.         Left: No mass, tenderness or fullness.    Lymphadenopathy:     Upper Body:     Right upper body: No axillary adenopathy.     Left upper body: No axillary adenopathy.  Skin:    General: Skin is warm and dry.  Neurological:     Mental Status: She is alert and oriented to person, place, and time.  Psychiatric:        Mood and Affect: Mood normal.        Thought Content: Thought content normal.        Judgment: Judgment normal.  Raynelle Fanning, CMA present for exam  Assessment/Plan:   1. Well woman exam with routine gynecological exam (Primary) - Cytology - PAP( [facility])  2. Menorrhagia with regular cycle - norethindrone (MICRONOR) 0.35 MG tablet; Take 1 tablet (0.35 mg total) by mouth daily.  Dispense: 84 tablet; Refill: 4   Discussed SBE, colonoscopy and DEXA screening as directed/appropriate. Recommend of exercise weekly, including weight bearing exercise. Encouraged the use of seatbelts and sunscreen.  No follow-ups on file.  Arlie Solomons B WHNP-BC 2:38 PM 12/14/2023

## 2023-12-20 ENCOUNTER — Encounter: Payer: Self-pay | Admitting: Nurse Practitioner

## 2023-12-20 LAB — CYTOLOGY - PAP
Adequacy: ABSENT
Comment: NEGATIVE
Diagnosis: NEGATIVE
High risk HPV: NEGATIVE

## 2023-12-27 ENCOUNTER — Emergency Department (HOSPITAL_BASED_OUTPATIENT_CLINIC_OR_DEPARTMENT_OTHER)
Admission: EM | Admit: 2023-12-27 | Discharge: 2023-12-27 | Disposition: A | Discharge status: Home / Self Care | Attending: Emergency Medicine | Admitting: Emergency Medicine

## 2023-12-27 ENCOUNTER — Other Ambulatory Visit: Payer: Self-pay

## 2023-12-27 ENCOUNTER — Emergency Department (HOSPITAL_BASED_OUTPATIENT_CLINIC_OR_DEPARTMENT_OTHER)

## 2023-12-27 DIAGNOSIS — Z7951 Long term (current) use of inhaled steroids: Secondary | ICD-10-CM | POA: Diagnosis not present

## 2023-12-27 DIAGNOSIS — S46912A Strain of unspecified muscle, fascia and tendon at shoulder and upper arm level, left arm, initial encounter: Secondary | ICD-10-CM | POA: Insufficient documentation

## 2023-12-27 DIAGNOSIS — S86112A Strain of other muscle(s) and tendon(s) of posterior muscle group at lower leg level, left leg, initial encounter: Secondary | ICD-10-CM | POA: Diagnosis not present

## 2023-12-27 DIAGNOSIS — X58XXXA Exposure to other specified factors, initial encounter: Secondary | ICD-10-CM | POA: Insufficient documentation

## 2023-12-27 DIAGNOSIS — S4992XA Unspecified injury of left shoulder and upper arm, initial encounter: Secondary | ICD-10-CM | POA: Diagnosis present

## 2023-12-27 DIAGNOSIS — Z79899 Other long term (current) drug therapy: Secondary | ICD-10-CM | POA: Insufficient documentation

## 2023-12-27 DIAGNOSIS — M79622 Pain in left upper arm: Secondary | ICD-10-CM | POA: Diagnosis not present

## 2023-12-27 DIAGNOSIS — Z91010 Allergy to peanuts: Secondary | ICD-10-CM | POA: Insufficient documentation

## 2023-12-27 DIAGNOSIS — T148XXA Other injury of unspecified body region, initial encounter: Secondary | ICD-10-CM

## 2023-12-27 DIAGNOSIS — I10 Essential (primary) hypertension: Secondary | ICD-10-CM | POA: Insufficient documentation

## 2023-12-27 DIAGNOSIS — J45909 Unspecified asthma, uncomplicated: Secondary | ICD-10-CM | POA: Insufficient documentation

## 2023-12-27 DIAGNOSIS — Z91040 Latex allergy status: Secondary | ICD-10-CM | POA: Diagnosis not present

## 2023-12-27 DIAGNOSIS — E039 Hypothyroidism, unspecified: Secondary | ICD-10-CM | POA: Insufficient documentation

## 2023-12-27 LAB — BASIC METABOLIC PANEL WITH GFR
Anion gap: 12 (ref 5–15)
BUN: 14 mg/dL (ref 6–20)
CO2: 26 mmol/L (ref 22–32)
Calcium: 9.7 mg/dL (ref 8.9–10.3)
Chloride: 102 mmol/L (ref 98–111)
Creatinine, Ser: 0.98 mg/dL (ref 0.44–1.00)
GFR, Estimated: >60 mL/min (ref >60)
Glucose, Bld: 93 mg/dL (ref 70–99)
Potassium: 4.1 mmol/L (ref 3.5–5.1)
Sodium: 140 mmol/L (ref 135–145)

## 2023-12-27 LAB — CBC WITH DIFFERENTIAL/PLATELET
Abs Immature Granulocytes: 0.03 10*3/uL (ref 0.00–0.07)
Basophils Absolute: 0 10*3/uL (ref 0.0–0.1)
Basophils Relative: 0 %
Eosinophils Absolute: 0.3 10*3/uL (ref 0.0–0.5)
Eosinophils Relative: 3 %
HCT: 37 % (ref 36.0–46.0)
Hemoglobin: 11.9 g/dL — ABNORMAL LOW (ref 12.0–15.0)
Immature Granulocytes: 0 %
Lymphocytes Relative: 30 %
Lymphs Abs: 3 10*3/uL (ref 0.7–4.0)
MCH: 26.7 pg (ref 26.0–34.0)
MCHC: 32.2 g/dL (ref 30.0–36.0)
MCV: 83.1 fL (ref 80.0–100.0)
Monocytes Absolute: 0.5 10*3/uL (ref 0.1–1.0)
Monocytes Relative: 5 %
Neutro Abs: 6 10*3/uL (ref 1.7–7.7)
Neutrophils Relative %: 62 %
Platelets: 273 10*3/uL (ref 150–400)
RBC: 4.45 MIL/uL (ref 3.87–5.11)
RDW: 13.2 % (ref 11.5–15.5)
WBC: 9.8 10*3/uL (ref 4.0–10.5)
nRBC: 0 % (ref 0.0–0.2)

## 2023-12-27 MED ORDER — KETOROLAC TROMETHAMINE 15 MG/ML IJ SOLN
15.0000 mg | Freq: Once | INTRAMUSCULAR | Status: AC
  Administered 2023-12-27: 15 mg via INTRAMUSCULAR
  Filled 2023-12-27: qty 1

## 2023-12-27 NOTE — ED Triage Notes (Signed)
 Pt via pov from home with left arm pain x 10 days. She reports that she had prior blood clot in this arm; no longer takes thinners. She states that the pain has gotten worse over the last 2 days. Pt denies any sob. Pt alert & oriented, nad noted.

## 2023-12-27 NOTE — ED Provider Notes (Signed)
  EMERGENCY DEPARTMENT AT [facility] Provider Note   CSN: [id number] Arrival date & time: 12/27/23  1619     History  Chief Complaint  Patient presents with   Arm Pain    [name] is a 53 y.o. female with PMHx anxiety, asthma, fibromyalgia, HTN, hypothyroidism, GERD, who presents to ED concerned for left upper arm pain. Patient stating that pain is throbbing and feels similar to a blood clot - patient had a blood clot in her left arm years ago which was actually in the left lower arm. Patient has not recently been on blood thinners. Patient initially denying any type of trauma that could have caused patient's pain, but patient's family member later stating that patient might have strained a muscle while lifting one of their parent's mobility devices. Denies any infectious symptoms today.   Arm Pain       Home Medications Prior to Admission medications   Medication Sig Start Date End Date Taking? Authorizing Provider  acetaminophen  (TYLENOL ) 650 MG CR tablet Take by mouth.    [provider]  albuterol  (PROAIR  HFA) 108 (90 Base) MCG/ACT inhaler Inhale 2 puffs into the lungs every 6 (six) hours as needed for wheezing or shortness of breath. 11/24/19   Diamond Formica, MD  ALPRAZolam  (XANAX  XR) 0.5 MG 24 hr tablet Take 0.5 mg by mouth 2 (two) times daily. 12/05/23   [provider]  baclofen (LIORESAL) 20 MG tablet Take 10 mg by mouth as needed. 05/23/18   [provider]  bisoprolol  (ZEBETA ) 10 MG tablet Take 10 mg by mouth daily.     [provider]  buprenorphine  (BUTRANS ) 15 MCG/HR Place onto the skin once a week.    [provider]  cetirizine (ZYRTEC) 10 MG tablet Take 10 mg by mouth daily.     [provider]  Cholecalciferol (VITAMIN D3 PO) Take 5,000 Int'l Units by mouth daily. With K    [provider]  citalopram  (CELEXA ) 40 MG tablet Take 40 mg by mouth daily.    [provider]   clonazePAM  (KLONOPIN ) 0.5 MG tablet Take 1 tablet (0.5 mg total) by mouth 2 (two) times daily. 04/11/23   Cottle, Kennedy Peabody., MD  cloNIDine  (CATAPRES ) 0.1 MG tablet TAKE 1/2 TABLET TWICE A DAY BY MOUTH 08/08/23   Cottle, Kennedy Peabody., MD  fluticasone [facility]) 50 MCG/ACT nasal spray     [provider]  Fluticasone Furoate  (ARNUITY ELLIPTA ) 50 MCG/ACT AEPB Inhale 1 puff into the lungs daily. Patient not taking: Reported on 12/14/2023 01/22/23   Roetta Clarke, NP  gabapentin  (NEURONTIN ) 600 MG tablet Take 1 tablet (600 mg total) by mouth 3 (three) times daily. Patient taking differently: Take 600 mg by mouth 2 (two) times daily. 11/27/22   Cottle, Kennedy Peabody., MD  hydrOXYzine  (ATARAX /VISTARIL ) 25 MG tablet Take 25 mg by mouth as needed for anxiety.  08/19/17   [provider]  Hyoscyamine Sulfate SL 0.125 MG SUBL Take 0.125 mg by mouth every 4 (four) hours as needed for cramping. 05/23/18   [provider]  imipramine (TOFRANIL) 25 MG tablet Take 1 tablet by mouth at bedtime. 12/03/23 12/02/24  [provider]  Melatonin 10 MG TABS Take 5 mg by mouth at bedtime.    [provider]  Multiple Vitamin (MULTI-VITAMIN) tablet Take by mouth.    [provider]  norethindrone  (MICRONOR ) 0.35 MG tablet Take 1 tablet (0.35 mg total) by  mouth daily. 12/14/23   Chrzanowski, Jami B, NP  oxybutynin (DITROPAN XL) 15 MG 24 hr tablet Take 1 tablet by mouth daily. 12/03/23 12/02/24  [provider]  predniSONE  (DELTASONE ) 20 MG tablet Take 2 tablets (40 mg total) by mouth daily. Patient not taking: Reported on 12/14/2023 10/12/23   Almond Army, MD  prochlorperazine (COMPAZINE) 10 MG tablet Take 10 mg by mouth every 6 (six) hours as needed for nausea or vomiting.     [provider]  progesterone  (PROMETRIUM ) 200 MG capsule Take 200 mg by mouth at bedtime. 11/25/23   [provider]  promethazine  (PHENERGAN ) 25 MG tablet Take 25 mg by mouth  every 6 (six) hours as needed for nausea or vomiting.     [provider]  rOPINIRole  (REQUIP ) 2 MG tablet Take 2 mg by mouth at bedtime.     [provider]  Spacer/Aero-Holding Chambers (AEROCHAMBER MV) inhaler Use with inhaler 07/24/22   Cobb, Mariah Shines, NP  thyroid  (ARMOUR) 120 MG tablet Take by mouth. 07/01/13   [provider]  vitamin B-12 (CYANOCOBALAMIN ) 500 MCG tablet Take 500 mcg by mouth daily.    [provider]  zinc  sulfate, 50mg  elemental zinc , 220 (50 Zn) MG capsule Take by mouth.    [provider]      Allergies    Allegra [fexofenadine hcl], Fexofenadine, Oseltamivir, Tamiflu [oseltamivir phosphate], Vancomycin, Diphenhydramine , Latex, Sulfamethoxazole, Aspirin , Clindamycin /lincomycin, Doxycycline, Other, Diphenhydramine  hcl, Morphine, Oseltamivir phosphate, Peanut-containing drug products, Penicillins, and Sulfonamide derivatives    Review of Systems   Review of Systems  Musculoskeletal:        Arm pain    Physical Exam Updated Vital Signs BP 127/67   Pulse 76   Temp 98.5 F (36.9 C)   Resp 17   Ht 5' 1.75" (1.568 m)   Wt 95.6 kg   LMP 12/07/2023 (Approximate)   SpO2 100%   BMI 38.86 kg/m  Physical Exam Vitals and nursing note reviewed.  Constitutional:      General: She is not in acute distress.    Appearance: She is not ill-appearing or toxic-appearing.  HENT:     Head: Normocephalic and atraumatic.  Eyes:     General: No scleral icterus.       Right eye: No discharge.        Left eye: No discharge.     Conjunctiva/sclera: Conjunctivae normal.  Cardiovascular:     Rate and Rhythm: Normal rate.  Pulmonary:     Effort: Pulmonary effort is normal.  Abdominal:     General: Abdomen is flat.  Musculoskeletal:     Comments: Proximal bicep tender to palpation. No overlying erythema, bruising, or increased warmth. +2 radial pulse. Brisk capillary refill.  Sensation to light touch intact.  Area nontense.  Right shoulder ROM intact.  Skin:    General: Skin is warm and dry.  Neurological:     General: No focal deficit present.     Mental Status: She is alert. Mental status is at baseline.  Psychiatric:        Mood and Affect: Mood normal.        Behavior: Behavior normal.     ED Results / Procedures / Treatments   Labs (all labs ordered are listed, but only abnormal results are displayed) Labs Reviewed  CBC WITH DIFFERENTIAL/PLATELET - Abnormal; Notable for the following components:      Result Value   Hemoglobin 11.9 (*)    All other components within  normal limits  BASIC METABOLIC PANEL WITH GFR    EKG None  Radiology US  Venous Img Upper Left (DVT Study) Result Date: 12/27/2023 CLINICAL DATA:  Left arm pain for 10 days. EXAM: LEFT UPPER EXTREMITY VENOUS DOPPLER ULTRASOUND TECHNIQUE: Gray-scale sonography with graded compression, as well as color Doppler and duplex ultrasound were performed to evaluate the upper extremity deep venous system from the level of the subclavian vein and including the jugular, axillary, basilic, radial, ulnar and upper cephalic vein. Spectral Doppler was utilized to evaluate flow at rest and with distal augmentation maneuvers. COMPARISON:  October 25, 2022 FINDINGS: Contralateral Subclavian Vein: Respiratory phasicity is normal and symmetric with the symptomatic side. No evidence of thrombus. Normal compressibility. Internal Jugular Vein: No evidence of thrombus. Normal compressibility, respiratory phasicity and response to augmentation. Subclavian Vein: No evidence of thrombus. Normal compressibility, respiratory phasicity and response to augmentation. Axillary Vein: No evidence of thrombus. Normal compressibility, respiratory phasicity and response to augmentation. Cephalic Vein: No evidence of thrombus. Normal compressibility, respiratory phasicity and response to augmentation. Basilic Vein: No evidence of thrombus. Normal compressibility, respiratory  phasicity and response to augmentation. Brachial Veins: No evidence of thrombus. Normal compressibility, respiratory phasicity and response to augmentation. Radial Veins: No evidence of thrombus. Normal compressibility, respiratory phasicity and response to augmentation. Ulnar Veins: No evidence of thrombus. Normal compressibility, respiratory phasicity and response to augmentation. Venous Reflux:  None visualized. Other Findings:  None visualized. IMPRESSION: No evidence of DVT within the LEFT upper extremity. Electronically Signed   By: Virgle Grime M.D.   On: 12/27/2023 19:27    Procedures Procedures    Medications Ordered in ED Medications  ketorolac  (TORADOL ) 15 MG/ML injection 15 mg (15 mg Intramuscular Given 12/27/23 2030)    ED Course/ Medical Decision Making/ A&P                                 Medical Decision Making Amount and/or Complexity of Data Reviewed Labs: ordered.  Risk Prescription drug management.   This patient presents to the ED for concern of left upper arm pain, this involves an extensive number of treatment options, and is a complaint that carries with it a high risk of complications and morbidity.  The differential diagnosis includes hemarthrosis, gout, septic joint, fracture, tendonitis, muscle strain, bursitis, compartment syndrome, DVT   Co morbidities that complicate the patient evaluation  anxiety, asthma, fibromyalgia, HTN, hypothyroidism, GERD   Additional history obtained:  Dr. Schuyler Custard PCP   Problem List / ED Course / Critical interventions / Medication management  Patient presents to ED concern for left upper arm pain.  Patient stating that it feels similar to a blood clot that she has had in this arm in the past.  Patient not currently on blood thinners.  Family member stating that pain could be from patient lifting her parents mobility chairs recently.  Physical exam with tenderness to palpation of proximal bicep.  Rest of physical exam  reassuring.  Patient afebrile with stable vitals. I Ordered, and personally interpreted labs.  BMP unremarkable.  CBC with mild anemia with hemoglobin at 11.9.  No leukocytosis. I ordered imaging studies including UE US . I independently visualized and interpreted imaging. I agree with the radiologist interpretation of no acute process. Shared results with patient.  Answered all questions. It appears that patient's symptoms are due to a muscle strain.  Will provide patient with a shoulder sling so her arm  can rest appropriately.  Offered patient shot of Toradol  which she agreed to take.  Recommended following up with orthopedics.  Patient stating that she has her own orthopedic provider that she would like to use.  Patient agrees to call them tomorrow morning for follow-up appointment. I have reviewed the patients home medicines and have made adjustments as needed The patient has been appropriately medically screened and/or stabilized in the ED. I have low suspicion for any other emergent medical condition which would require further screening, evaluation or treatment in the ED or require inpatient management. At time of discharge the patient is hemodynamically stable and in no acute distress. I have discussed work-up results and diagnosis with patient and answered all questions. Patient is agreeable with discharge plan. We discussed strict return precautions for returning to the emergency department and they verbalized understanding.      Social Determinants of Health:  none         Final Clinical Impression(s) / ED Diagnoses Final diagnoses:  Muscle strain    Rx / DC Orders ED Discharge Orders     None         Don Fritter 12/27/23 2121    Onetha Bile, MD 12/28/23 1504

## 2023-12-27 NOTE — ED Provider Triage Note (Signed)
 Emergency Medicine Provider Triage Evaluation Note  [name] , a 53 y.o. female  was evaluated in triage.  Pt complains of left arm pain.  Review of Systems  Positive:  Negative:   Physical Exam  BP (!) 149/106 (BP Location: Right Arm)   Pulse 76   Temp 98.5 F (36.9 C)   Resp 17   Ht 5' 1.75" (1.568 m)   Wt 95.6 kg   LMP 12/07/2023 (Approximate)   SpO2 99%   BMI 38.86 kg/m  Gen:   Awake, no distress   Resp:  Normal effort  MSK:   Moves extremities without difficulty  Other:    Medical Decision Making  Medically screening exam initiated at 4:42 PM.  Appropriate orders placed.  [name] was informed that the remainder of the evaluation will be completed by another provider, this initial triage assessment does not replace that evaluation, and the importance of remaining in the ED until their evaluation is complete.  Left upper arm pain feels similar to prior left arm DVT. Patient no longer on blood thinners. No trauma to left arm.   [name], [place] 12/27/23 [phone]

## 2023-12-27 NOTE — Discharge Instructions (Addendum)
 It was a pleasure caring for you today.  Please follow-up with your primary care provider and orthopedic provider.  Seek emergency care if experiencing any new or worsening symptoms.  Alternating between 650 mg Tylenol and 400 mg Advil: The best way to alternate taking Acetaminophen (example Tylenol) and Ibuprofen (example Advil/Motrin) is to take them 3 hours apart. For example, if you take ibuprofen at 6 am you can then take Tylenol at 9 am. You can continue this regimen throughout the day, making sure you do not exceed the recommended maximum dose for each drug.

## 2024-01-02 ENCOUNTER — Ambulatory Visit (INDEPENDENT_AMBULATORY_CARE_PROVIDER_SITE_OTHER): Admitting: Sports Medicine

## 2024-01-02 ENCOUNTER — Encounter: Payer: Self-pay | Admitting: Physician Assistant

## 2024-01-02 ENCOUNTER — Encounter: Payer: Self-pay | Admitting: Sports Medicine

## 2024-01-02 ENCOUNTER — Other Ambulatory Visit: Payer: Self-pay

## 2024-01-02 ENCOUNTER — Ambulatory Visit (INDEPENDENT_AMBULATORY_CARE_PROVIDER_SITE_OTHER): Admitting: Physician Assistant

## 2024-01-02 DIAGNOSIS — G8929 Other chronic pain: Secondary | ICD-10-CM

## 2024-01-02 DIAGNOSIS — M25512 Pain in left shoulder: Secondary | ICD-10-CM

## 2024-01-02 MED ORDER — METHYLPREDNISOLONE ACETATE 40 MG/ML IJ SUSP
80.0000 mg | INTRAMUSCULAR | Status: AC | PRN
Start: 2024-01-02 — End: 2024-01-02
  Administered 2024-01-02: 80 mg via INTRA_ARTICULAR

## 2024-01-02 MED ORDER — LIDOCAINE HCL 1 % IJ SOLN
2.0000 mL | INTRAMUSCULAR | Status: AC | PRN
Start: 2024-01-02 — End: 2024-01-02
  Administered 2024-01-02: 2 mL

## 2024-01-02 MED ORDER — BUPIVACAINE HCL 0.25 % IJ SOLN
2.0000 mL | INTRAMUSCULAR | Status: AC | PRN
Start: 2024-01-02 — End: 2024-01-02
  Administered 2024-01-02: 2 mL via INTRA_ARTICULAR

## 2024-01-02 NOTE — Progress Notes (Signed)
   Procedure Note  Patient: [name]             Date of Birth: 12/25/1970           MRN: [id number]             Visit Date: 01/02/2024  Procedures: Visit Diagnoses:  1. Chronic left shoulder pain    Large Joint Inj: L glenohumeral on 01/02/2024 11:44 AM Indications: pain Details: 22 G 3.5 in needle, ultrasound-guided posterior approach Medications: 2 mL lidocaine  1 %; 2 mL bupivacaine  0.25 %; 80 mg methylPREDNISolone  acetate 40 MG/ML Outcome: tolerated well, no immediate complications  US -guided glenohumeral joint injection, left shoulder After discussion on risks/benefits/indications, informed verbal consent was obtained. A timeout was then performed. The patient was positioned lying lateral recumbent on examination table. The patient's shoulder was prepped with betadine and multiple alcohol swabs and utilizing ultrasound guidance, the patient's glenohumeral joint was identified on ultrasound. Using ultrasound guidance a 22-gauge, 3.5 inch needle with a mixture of 2:2:2 cc's lidocaine :bupivicaine:depomedrol was directed from a lateral to medial direction via in-plane technique into the glenohumeral joint with visualization of appropriate spread of injectate into the joint. Patient tolerated the procedure well without immediate complications.      Procedure, treatment alternatives, risks and benefits explained, specific risks discussed. Consent was given by the patient. Immediately prior to procedure a time out was called to verify the correct patient, procedure, equipment, support staff and site/side marked as required. Patient was prepped and draped in the usual sterile fashion.     - patient tolerated procedure well, discussed post-injection protocol - follow-up with Lyndol Santee, Dr. Christiane Cowing as indicated; I am happy to see them as needed  Shauna Del, DO Primary Care Sports Medicine Physician  [facility]  This note was dictated using Dragon  naturally speaking software and may contain errors in syntax, spelling, or content which have not been identified prior to signing this note.

## 2024-01-02 NOTE — Progress Notes (Signed)
 Office Visit Note   Patient: [name]           Date of Birth: July 17, 1971           MRN: [id number] Visit Date: 01/02/2024              Requested by: Imelda Man, MD [address] [place],  [place] [address] PCP: Imelda Man, MD   Assessment & Plan: Visit Diagnoses:  1. Chronic left shoulder pain     Plan: Impression is left shoulder proximal biceps tendinitis.  We have discussed referral to Dr. Vaughn Georges for ultrasound-guided glenohumeral cortisone injection.  She is agreeable to this plan.  She will follow-up as needed.  Follow-Up Instructions: Return if symptoms worsen or fail to improve.   Orders:  No orders of the defined types were placed in this encounter.  No orders of the defined types were placed in this encounter.     Procedures: No procedures performed   Clinical Data: No additional findings.   Subjective: Chief Complaint  Patient presents with   Left Upper Arm - Pain    HPI patient is a very pleasant 53 year old female who comes in today with left anterior shoulder pain.  Symptoms began about 2 to 3 weeks ago and is progressively worsened.  She initially thought this was a DVT as she has had a previous DVT to this area with similar symptoms.  She was seen at [facility] where ultrasound was obtained.  This was negative for DVT.  She is here today for further evaluation treatment recommendation.  Majority of her pain is to the proximal biceps and radiates distally.  Pain is described as a constant throb worse with any lifting.  She has tried ibuprofen and Tylenol  without significant relief.  Review of Systems as detailed in HPI.  All others reviewed and are negative.   Objective: Vital Signs: LMP 12/07/2023 (Approximate)   Physical Exam well-developed well-nourished female in no acute distress.  Alert and oriented x 3.  Ortho Exam left shoulder exam: Full active range of motion in all planes.  She does have tenderness to the  bicipital groove.  Positive speeds test.  Pain with resisted elbow flexion.  She has neurovascular intact distally.  Specialty Comments:  No specialty comments available.  Imaging: No new imaging   PMFS History: Patient Active Problem List   Diagnosis Date Noted   Interstitial cystitis 07/31/2022   Mild obstructive sleep apnea 07/24/2022   Excessive daytime sleepiness 05/03/2022   History of COVID-19 01/20/2020   Brain fog 01/20/2020   Pneumonia due to COVID-19 virus 12/26/2019   Upper airway cough syndrome vs VCD vs asthma 11/12/2019   Personal history of endometriosis 11/30/2016   Obesity 07/03/2014   DOE (dyspnea on exertion) 07/02/2014   Clostridium difficile infection    Gastroparesis 06/15/2009   INSOMNIA 05/12/2009   EPIGASTRIC PAIN 04/15/2009   INFECTIOUS MONONUCLEOSIS 03/10/2009   TACHYCARDIA 03/10/2009   ADENOMATOUS COLONIC POLYP 12/05/2007   FUNGAL DERMATITIS 11/30/2006   HYPERLIPIDEMIA 11/30/2006   PANIC DISORDER 11/30/2006   Asthma 11/30/2006   GERD 11/30/2006   IRRITABLE BOWEL SYNDROME 11/30/2006   INTERSTITIAL CYSTITIS 11/30/2006   MYOFASCIAL PAIN SYNDROME 11/30/2006   CHRONIC FATIGUE SYNDROME 11/30/2006   HEADACHE 11/30/2006   Past Medical History:  Diagnosis Date   Anxiety    Arrhythmia    heart races when pt in pain   Asthma    Clostridium difficile infection 2014   Epstein Barr virus  infection    Fibromyalgia    GERD (gastroesophageal reflux disease)    History of COVID-19    Hypertension    Hypothyroidism    Immune deficiency disorder (HCC)    Interstitial cystitis    Lyme disease    Mold contact confirmed    Toxic Mold syndrome   Mononucleosis 01/2009   Ovarian cyst    Serous Cystadenofibroma-Left ovary   PONV (postoperative nausea and vomiting)    Shortness of breath    on exertion   Systolic murmur    Thrombophlebitis    Thyroid  disease     Family History  Problem Relation Age of Onset   Rheum arthritis Mother     Hypertension Mother    Diabetes Mother    Pulmonary embolism Mother        x2   Heart disease Mother    Other Mother        factor 5   Pulmonary fibrosis Mother    Atrial fibrillation Mother    Hypertension Father    Thyroid  disease Father    Breast cancer Maternal Aunt        Age 64   Pancreatic cancer Maternal Grandmother    Heart disease Maternal Grandfather    Breast cancer Paternal Grandmother        Age 73   Asthma Paternal Grandmother     Past Surgical History:  Procedure Laterality Date   BLADDER SURGERY     CHOLECYSTECTOMY     FECAL TRANSPLANT     Groin cyst     LAPAROSCOPY Right 06/12/2013   Procedure: LAPAROSCOPY OPERATIVE  fulgeration of endometriosis, right salpingooophorectomy;  Surgeon: Davia Erps, MD;  Location: [facility];  Service: Gynecology;  Laterality: Right;   Nose cyst     o shot     OOPHORECTOMY Right 06/12/2013   Procedure: OOPHORECTOMY;POSS RIGHT SALPING OOPHORECTOMY;  Surgeon: Davia Erps, MD;  Location: [facility];  Service: Gynecology;  Laterality: Right;   PELVIC LAPAROSCOPY     DL Ovarian cystectomy-Left   TONSILLECTOMY  2009   Social History   Occupational History   Occupation: Runner, broadcasting/film/video  Tobacco Use   Smoking status: Never    Passive exposure: Never   Smokeless tobacco: Never  Vaping Use   Vaping status: Never Used  Substance and Sexual Activity   Alcohol use: No    Alcohol/week: 0.0 standard drinks of alcohol   Drug use: No   Sexual activity: Never    Partners: Male    Birth control/protection: Abstinence    Comment: never sexually active, menarche 53yo

## 2024-01-04 ENCOUNTER — Other Ambulatory Visit: Payer: Self-pay | Admitting: Psychiatry

## 2024-01-04 DIAGNOSIS — F4001 Agoraphobia with panic disorder: Secondary | ICD-10-CM

## 2024-01-04 DIAGNOSIS — F411 Generalized anxiety disorder: Secondary | ICD-10-CM

## 2024-01-16 ENCOUNTER — Encounter: Payer: Self-pay | Admitting: Psychiatry

## 2024-01-16 ENCOUNTER — Ambulatory Visit (INDEPENDENT_AMBULATORY_CARE_PROVIDER_SITE_OTHER): Admitting: Psychiatry

## 2024-01-16 DIAGNOSIS — G4733 Obstructive sleep apnea (adult) (pediatric): Secondary | ICD-10-CM

## 2024-01-16 DIAGNOSIS — G2581 Restless legs syndrome: Secondary | ICD-10-CM | POA: Diagnosis not present

## 2024-01-16 DIAGNOSIS — F411 Generalized anxiety disorder: Secondary | ICD-10-CM | POA: Diagnosis not present

## 2024-01-16 DIAGNOSIS — F4001 Agoraphobia with panic disorder: Secondary | ICD-10-CM

## 2024-01-16 DIAGNOSIS — F5105 Insomnia due to other mental disorder: Secondary | ICD-10-CM

## 2024-01-16 MED ORDER — CLONIDINE HCL 0.1 MG PO TABS
ORAL_TABLET | ORAL | 1 refills | Status: AC
Start: 2024-01-16 — End: ?

## 2024-01-16 MED ORDER — GABAPENTIN 600 MG PO TABS: 600.0000 mg | ORAL_TABLET | Freq: Two times a day (BID) | ORAL | 0 refills | Status: DC

## 2024-01-16 NOTE — Progress Notes (Signed)
 [name] [id number] [birth year] 53 y.o.  Subjective:   Patient ID:  [name] is a 53 y.o. (DOB 06-12-1971) female.  Chief Complaint:  Chief Complaint  Patient presents with   Follow-up   Anxiety   Sleeping Problem   Stress    Health issues    HPI [name] presents to the office today for follow-up of panic disorder, generalized anxiety, and insomnia.  First seen December 02, 2019. Referred by Dr. Imelda Man.  She was on alprazolam  XR 0.5 mg twice daily and citalopram  40 mg daily along with opiates at the time. Buspirone  was initiated.  04/21/2020 appointment with the following noted: Covid and hosp for a week 12/26/19.  Got Remdesivir .  Finally recovering.  Still tired and aches. Steroids helped chronic pain from Lyme dz but relapsed off steroids. On opiates [place]  still.  Still on Xanax  XR 0.5 mg BID, citalopram  40 mg daily.  Off buspirone  bc HA and shakey after a week. Still anxious about Covid.  Has to push herself to leave the house.  No full panic but panicky feelings. Still sees therapist [name]. Settled worker's comp case from 10 years ago. Celexa  does best of SSRI she's taken but not controlled. Normal QT interval now. Trouble with sleep sometimes. Plan: She prefers rispieridone 0.5 mg HS for a week then 1 tablet nightly if needed.  06/23/2020 appointment with the following noted: Too foggy and slowed down on risperidone  0.5 mg after a week's trial. So stopped it. Went to Post-Covid clinic yesterday with good antibodies.   Shouldn't worry over covid but still does.  Life in limbo after settled worker's comp. Exercising, pray, writing and therapy with [name]. Can awaken with panic every 2-3 days.  Distracts herself. Awoke at 2AM with panic with fear of not breathing.  Anxiety driving.   Chronic fatigue and sleepy before and worse after post Covid  Plan:  Retry gabapentin   800 mg tablet 1/2 tablet QID up to 1 tablet QID  08/23/2020 appointment  with the following noted: Increase gabapentin  to 800 mg QID.  Works good for 2 hours and then seems to wear off.   NO SE.  Asks about time release.  Helped with panicky feelings and pain.   Hx RLS Anxiety is improved but not gone as noted.  When it runs out still awakens with panic but able to go back to sleep.  04/14/21 appt noted: Hydroxyzine  for IC. Wants gabapentin  ER and needs PA to get it.  Has taken regular gabapentin  in it's place.  Regular gabapentin  will not keep her asleep and awakens about 5 AM feeling panicky after 5-6 hours of sleep.  Gabapentin  helps anxiety, pain and RLS but inadequate duration.  Has never taken gabapentin  ER but wants to take it for greater duration and to stop withdrawal sx from the short acting gabapentin . Gabapentin  helps ropinirole  help RLS but it wears off as noted.  Gabapentin  helps pain with better sleep until it wears off. "Tremendous" dental work repeatedly with dental problems causes anxiety.  [place] was causing dental problems.  3 more surgeries and then will be done.  Tooth pain and gum problems. Not sig depressed.  06/23/21 appt noted: PA denied for ER gabapentin  bc not on formulary. Taking gabapentin  800 QID instead. Still wakens at night with anxiety and thinking of all she needs to do.  Worrying for ahwile.  Avg 5-7 hours of sleep.  Family interferes some too. Can awaken with hear racing  and feeling panicky. Not depressed but anxious. Plan: Trial mirtazapine 30  09/01/2021 appointment with the following noted: Never got mirtazapine. 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  She does the night shifts with mother and then trouble sleeping  after it.  Parents are gettting better but still anxious.  A lot of stress.  Getting a couple of hours nap and then 4-5  hours at night.  Sleep is still irregular.  Some nights EFA.  Still RLS problems several nights per week when first tries to go to sleep about half the nights. No other  med changes.   Asked about a girl in the news.   Plan: Gabapentin  800 QID since can't get horizant  Continue citalopram  40 Hold mirtazapine 30 Clonidine  0.1 mg start 1/2 tablet at night and gradually increase to 1/2 in the AM and 1 at night for sleep and anxiety and perimenopausal  sweating off label.  12/26/2021 appointment with the following noted: Pending sleep repeat study bc witnessed loud snoring and periods of apnea and excessive daytime drowsiness regardless of sleep quantity. Witnessed in sleep clinic by staff while there with mother in sleep study.  Health issues..  Will talk to Dr. Schuyler Custard. When increased clonidine  to 0.1 mg HA so reduced to 0.05 mg HS Still sweats but not as heavy and BP better with clonidine . Helps care for M with pulm fibrosis and hip fx.  Stress with that.  Worry over mother.    03/27/22 appt noted:  Administrative px around getting sleep study.   Wants LA gabapentin  to see if can sleep later and also bc short acting gabapentin  causes diarrhea and hopes LA would control pain better. Sees pain doc. Anxiiety is not severe but varies with pain. Chronic pain associated with severe muscle pain assoc with history of Lyme dz and chronic abdominal pain.  Went to [employer] [facility] [employer] for 6 mos. Daytime drowsiness. Has to avoid caffeine bc heart racing. Needs to nap. CC sleepy, pain, and anxity M is sick and caring for her.  06/14/22 appt noted: Still waiting for sleep study. Still taking clonidine  0.05 mg BID helps anxiety and BP. Continues citalopram , gabapentin  800 mg TID, hydroxyzine  for IC, Xanax  XR 0.5 mg BID Still on opiate, Butrans  Still sleepy and tired ongoing and predates current meds.  Doesn't feel rested.  Had these sx for years with remote negative sleep study. Had shingles in face and ear.  Still a little pain on side of head. Plan no changes yet.  Pending sleep study  10/03/22 TC  Patient is requesting to titrate down on the Gabapentin  . She  tried to cut in half without advisement, but she is not having success.   Pt wants to decrease gabapentin  due to doing some research and finding that it can contribute to bladder problems ,which she already has.Also she noticed that she is "not as sharp."She tried taking a half tab and it made her shaky,have headaches,and her pain level was out of control.    MD resp:  Yes she can trigger withdrawal symptoms if she drops the dose of gabapentin  too much at 1 time.  But it is not a difficult medicine to reduce if it is done gradually.  I sent in a prescription for 600 mg tablet instead of the 800 mg tablet and she takes 1 4 times a day and that transition should be fairly easy but give it about a week for her body to adjust.  11/27/22 appt noted: Psych med: Xanax  XR 0.5 BID, gabapentin  600 TID, citalopram  40, clonidine  0.1 HS, hydroxyzine  25 prn, ropinirole  2 HS Had WD with less gabapentin .  At 600 TID.  Was wanting to reduce bc cognitive fog.  Forgetting names and thought gabapentin  could be related. Does feel sharper with lower dose. Rx for pain and anxiety and did help anxiety.   Anxiety has crept up some with less gabapentin .   Sleep is pretty good.  Staying up later so can stay asleep.  Used to wake up in panic.   Struggling to lose wt.  Afraid to take Ozempic.  M pulm fiibrosis. Would like to later try reducing gabapentin .  04/11/23 appt noted: Psych med: Xanax  XR 0.5 BID, gabapentin  600 TID, citalopram  40, clonidine  0.1 HS, hydroxyzine  25 prn, ropinirole  2 HS PCP Dr. Schuyler Custard and then has alternative doctor with low cortisol and worries about it.  Her CRP is still high. Chronic anxiety.  Wonders if lab abnl could cause her ongoing anxiety.  Asks about adrenal fatigue from chronic anxiety.   Still awakens a lot.   Gabapentin  600 TID helped anxiety and sleep more at higher dose but had cog SE.  So reduced to BID.    Feels tired during the day. Therapist [name]. Takes buprenorphine  for  all over pain, muscle pain from history Lyme Dz. [facility]. RLS controlled with med.   Chronically sleepy during the day  01/16/24 appt noted: Psych med: Xanax  XR 0.5 BID &  citalopram  40, per Dr. Schuyler Custard, gabapentin  600 TID,  clonidine  0.05 HS, hydroxyzine  25 prn, ropinirole  2 HS M in hosp with CP and worrying about her.  Been up there taking care of her.  M thinks she's dying.   Anxiety overall is pretty good.  Health issues with bladder px.  Health issues will increase anxiety but did pretty well getting  through it.  No SE. RLS controlled  with med. Sleep variable.  Nonrestorative sleep.   Seeing alternative med doctor.   Some chronic medical worry over cortisol.   Taking a supplement.  But under more stress now with mother.   No med changes desired.    Past Psychiatric History: therapist [name] Remote [name]. [name] [facility] Past Psychiatric Medication Trials:  Zoloft SE HA and itching,  Paxil wt gain 30# and fatigue,  Fluoxetine NR,  Lexapro  not as good as Celexa  Cymbalta SE dryness dental problems, no pain benefit Gabapentin  NR pain midrange, Horizant  Gabatril for sleep 16 mg   Alprazolam  1 mg BID,  Xanax  XR Clonazepam  0.5 mg BID failed  Buspirone  SE HA  Risperidone  SE Clonidine  0.1  Trazodone, NR Ambien and Lunesta NR Mirtazapine 30 never taken  Review of Systems:  Review of Systems  Constitutional:  Positive for fatigue.  HENT:  Positive for dental problem.   Respiratory:  Positive for shortness of breath.   Cardiovascular:  Negative for chest pain and palpitations.  Musculoskeletal:  Positive for myalgias.  Neurological:  Positive for weakness. Negative for dizziness and tremors.       Restless legs  Psychiatric/Behavioral:  Positive for sleep disturbance. The patient is nervous/anxious.     Medications: I have reviewed the patient's current medications.  Current Outpatient Medications  Medication Sig Dispense Refill    ALPRAZolam  (XANAX  XR) 0.5 MG 24 hr tablet Take 0.5 mg by mouth 2 (two) times daily.     baclofen (LIORESAL) 20 MG tablet Take 10 mg by  mouth as needed.     buprenorphine  (BUTRANS ) 15 MCG/HR Place onto the skin once a week.     citalopram  (CELEXA ) 40 MG tablet Take 40 mg by mouth daily.     hydrOXYzine  (ATARAX /VISTARIL ) 25 MG tablet Take 25 mg by mouth as needed for anxiety.      Hyoscyamine Sulfate SL 0.125 MG SUBL Take 0.125 mg by mouth every 4 (four) hours as needed for cramping.     norethindrone  (MICRONOR ) 0.35 MG tablet Take 1 tablet (0.35 mg total) by mouth daily. 84 tablet 4   oxybutynin (DITROPAN XL) 15 MG 24 hr tablet Take 1 tablet by mouth daily.     progesterone  (PROMETRIUM ) 200 MG capsule Take 200 mg by mouth at bedtime.     promethazine  (PHENERGAN ) 25 MG tablet Take 25 mg by mouth every 6 (six) hours as needed for nausea or vomiting.      rOPINIRole  (REQUIP ) 2 MG tablet Take 2 mg by mouth at bedtime.      thyroid  (ARMOUR) 120 MG tablet Take by mouth.     vitamin B-12 (CYANOCOBALAMIN ) 500 MCG tablet Take 500 mcg by mouth daily.     acetaminophen  (TYLENOL ) 650 MG CR tablet Take by mouth.     albuterol  (PROAIR  HFA) 108 (90 Base) MCG/ACT inhaler Inhale 2 puffs into the lungs every 6 (six) hours as needed for wheezing or shortness of breath. 18 g 3   bisoprolol  (ZEBETA ) 10 MG tablet Take 10 mg by mouth daily.      cetirizine (ZYRTEC) 10 MG tablet Take 10 mg by mouth daily.      Cholecalciferol (VITAMIN D3 PO) Take 5,000 Int'l Units by mouth daily. With K     cloNIDine  (CATAPRES ) 0.1 MG tablet TAKE 1/2 TABLET TWICE A DAY BY MOUTH 90 tablet 1   fluticasone (FLONASE) 50 MCG/ACT nasal spray      Fluticasone Furoate  (ARNUITY ELLIPTA ) 50 MCG/ACT AEPB Inhale 1 puff into the lungs daily. (Patient not taking: Reported on 12/14/2023) 30 each 5   gabapentin  (NEURONTIN ) 600 MG tablet Take 1 tablet (600 mg total) by mouth 2 (two) times daily. 180 tablet 0   imipramine (TOFRANIL) 25 MG tablet Take 1  tablet by mouth at bedtime. (Patient not taking: Reported on 01/16/2024)     Melatonin 10 MG TABS Take 5 mg by mouth at bedtime.     Multiple Vitamin (MULTI-VITAMIN) tablet Take by mouth.     predniSONE  (DELTASONE ) 20 MG tablet Take 2 tablets (40 mg total) by mouth daily. (Patient not taking: Reported on 01/16/2024) 10 tablet 0   prochlorperazine (COMPAZINE) 10 MG tablet Take 10 mg by mouth every 6 (six) hours as needed for nausea or vomiting.  (Patient not taking: Reported on 01/16/2024)     Spacer/Aero-Holding Chambers (AEROCHAMBER MV) inhaler Use with inhaler 1 each 0   zinc  sulfate, 50mg  elemental zinc , 220 (50 Zn) MG capsule Take by mouth.     No current facility-administered medications for this visit.    Medication Side Effects: None  Allergies:  Allergies  Allergen Reactions   Allegra [Fexofenadine Hcl] Shortness Of Breath and Rash   Fexofenadine Shortness Of Breath, Rash and Other (See Comments)    Shakes, can't sleep, trouble breathing   Oseltamivir Other (See Comments)    Can't breathe, sick to stomach   Tamiflu [Oseltamivir Phosphate] Itching, Nausea And Vomiting and Other (See Comments)    Can't breathe, sick to stomach   Vancomycin Other (See Comments)  Red man's Syndrome.   Diphenhydramine  Other (See Comments)    Shakes, can't sleep, trouble breathing   Latex Itching, Dermatitis and Rash   Sulfamethoxazole Rash   Aspirin  Nausea And Vomiting   Clindamycin /Lincomycin Other (See Comments)    Reports it gave her cdiff- required a fecal transplant   Doxycycline    Other     NUT ALLERGY   Diphenhydramine  Hcl Palpitations and Rash   Morphine Other (See Comments)    REACTION: insomnia   Oseltamivir Phosphate Itching and Nausea And Vomiting   Peanut-Containing Drug Products Rash   Penicillins Itching and Swelling    Can tolerate Amoxicillin  OK.   Sulfonamide Derivatives Rash    Past Medical History:  Diagnosis Date   Anxiety    Arrhythmia    heart races when pt  in pain   Asthma    Clostridium difficile infection 2014   Epstein Barr virus infection    Fibromyalgia    GERD (gastroesophageal reflux disease)    History of COVID-19    Hypertension    Hypothyroidism    Immune deficiency disorder (HCC)    Interstitial cystitis    Lyme disease    Mold contact confirmed    Toxic Mold syndrome   Mononucleosis 01/2009   Ovarian cyst    Serous Cystadenofibroma-Left ovary   PONV (postoperative nausea and vomiting)    Shortness of breath    on exertion   Systolic murmur    Thrombophlebitis    Thyroid  disease     Family History  Problem Relation Age of Onset   Rheum arthritis Mother    Hypertension Mother    Diabetes Mother    Pulmonary embolism Mother        x2   Heart disease Mother    Other Mother        factor 5   Pulmonary fibrosis Mother    Atrial fibrillation Mother    Hypertension Father    Thyroid  disease Father    Breast cancer Maternal Aunt        Age 87   Pancreatic cancer Maternal Grandmother    Heart disease Maternal Grandfather    Breast cancer Paternal Grandmother        Age 95   Asthma Paternal Grandmother     Social History   Socioeconomic History   Marital status: Single    Spouse name: Not on file   Number of children: 0   Years of education: college   Highest education level: Not on file  Occupational History   Occupation: Runner, broadcasting/film/video  Tobacco Use   Smoking status: Never    Passive exposure: Never   Smokeless tobacco: Never  Vaping Use   Vaping status: Never Used  Substance and Sexual Activity   Alcohol use: No    Alcohol/week: 0.0 standard drinks of alcohol   Drug use: No   Sexual activity: Never    Partners: Male    Birth control/protection: Abstinence    Comment: never sexually active, menarche 53yo  Other Topics Concern   Not on file  Social History Narrative   Lives at home alone.   Right-handed.   Caffeine use: 1 cup per day.   Social Drivers of Corporate investment banker Strain: Not  on file  Food Insecurity: Low Risk  (12/03/2023)   Received from [facility]   Hunger Vital Sign    Worried About Running Out of Food in the Last Year: Never true    Ran Out of  Food in the Last Year: Never true  Transportation Needs: No Transportation Needs (12/03/2023)   Received from [facility]    In the past 12 months, has lack of reliable transportation kept you from medical appointments, meetings, work or from getting things needed for daily living? : No  Physical Activity: Not on file  Stress: Not on file  Social Connections: Unknown (01/05/2022)   [facility]   Social Network    Social Network: Not on file  Intimate Partner Violence: Not At Risk (11/04/2022)   Humiliation, Afraid, Rape, and Kick questionnaire    Fear of Current or Ex-Partner: No    Emotionally Abused: No    Physically Abused: No    Sexually Abused: No    Past Medical History, Surgical history, Social history, and Family history were reviewed and updated as appropriate.   Please see review of systems for further details on the patient's review from today.   Objective:   Physical Exam:  There were no vitals taken for this visit.  Physical Exam Constitutional:      General: She is not in acute distress.    Appearance: She is obese.  Musculoskeletal:        General: No deformity.  Neurological:     Mental Status: She is alert and oriented to person, place, and time.     Coordination: Coordination normal.  Psychiatric:        Attention and Perception: Attention and perception normal. She does not perceive auditory or visual hallucinations.        Mood and Affect: Mood is anxious. Mood is not depressed. Affect is not blunt, tearful or inappropriate.        Speech: Speech normal. Speech is not slurred.        Behavior: Behavior normal. Behavior is not slowed.        Thought Content: Thought content normal. Thought content is not paranoid or delusional.  Thought content does not include homicidal or suicidal ideation. Thought content does not include suicidal plan.        Cognition and Memory: Cognition and memory normal.        Judgment: Judgment normal.     Comments: Insight fair Chronic stress and anxiety ongoing including about meds  anxious questions continue     Lab Review:     Component Value Date/Time   NA 140 12/27/2023 1643   NA 140 01/23/2020 1202   NA 142 09/09/2013 1102   K 4.1 12/27/2023 1643   K 4.3 09/09/2013 1102   CL 102 12/27/2023 1643   CO2 26 12/27/2023 1643   CO2 27 09/09/2013 1102   GLUCOSE 93 12/27/2023 1643   GLUCOSE 95 09/09/2013 1102   BUN 14 12/27/2023 1643   BUN 12 01/23/2020 1202   BUN 11.0 09/09/2013 1102   CREATININE 0.98 12/27/2023 1643   CREATININE 0.79 11/07/2022 1410   CREATININE 0.9 09/09/2013 1102   CALCIUM  9.7 12/27/2023 1643   CALCIUM  9.9 09/09/2013 1102   PROT 7.4 11/07/2022 1410   PROT 7.1 01/23/2020 1202   PROT 7.9 09/09/2013 1102   ALBUMIN 4.1 11/07/2022 1410   ALBUMIN 4.2 01/23/2020 1202   ALBUMIN 3.5 09/09/2013 1102   AST 18 11/07/2022 1410   AST 19 09/09/2013 1102   ALT 14 11/07/2022 1410   ALT 18 09/09/2013 1102   ALKPHOS 60 11/07/2022 1410   ALKPHOS 80 09/09/2013 1102   BILITOT 0.8 11/07/2022 1410   BILITOT  0.53 09/09/2013 1102   GFRNONAA >60 12/27/2023 1643   GFRNONAA >60 11/07/2022 1410   GFRAA 92 01/23/2020 1202       Component Value Date/Time   WBC 9.8 12/27/2023 1643   RBC 4.45 12/27/2023 1643   HGB 11.9 (L) 12/27/2023 1643   HGB 13.4 11/07/2022 1410   HGB 12.7 01/23/2020 1202   HGB 13.9 09/09/2013 1102   HCT 37.0 12/27/2023 1643   HCT 39.5 01/23/2020 1202   HCT 40.9 09/09/2013 1102   PLT 273 12/27/2023 1643   PLT 289 11/07/2022 1410   PLT 332 01/23/2020 1202   MCV 83.1 12/27/2023 1643   MCV 88 01/23/2020 1202   MCV 89.1 09/09/2013 1102   MCH 26.7 12/27/2023 1643   MCHC 32.2 12/27/2023 1643   RDW 13.2 12/27/2023 1643   RDW 13.5 01/23/2020 1202    RDW 13.6 09/09/2013 1102   LYMPHSABS 3.0 12/27/2023 1643   LYMPHSABS 3.1 01/23/2020 1202   LYMPHSABS 3.2 09/09/2013 1102   MONOABS 0.5 12/27/2023 1643   MONOABS 0.5 09/09/2013 1102   EOSABS 0.3 12/27/2023 1643   EOSABS 0.4 01/23/2020 1202   BASOSABS 0.0 12/27/2023 1643   BASOSABS 0.1 01/23/2020 1202   BASOSABS 0.1 09/09/2013 1102    No results found for: "POCLITH", "LITHIUM"   No results found for: "PHENYTOIN", "PHENOBARB", "VALPROATE", "CBMZ"   .res Assessment: Plan:    Leida was seen today for follow-up, anxiety, sleeping problem and stress.  Diagnoses and all orders for this visit:  Generalized anxiety disorder -     cloNIDine  (CATAPRES ) 0.1 MG tablet; TAKE 1/2 TABLET TWICE A DAY BY MOUTH -     gabapentin  (NEURONTIN ) 600 MG tablet; Take 1 tablet (600 mg total) by mouth 2 (two) times daily.  Panic disorder with agoraphobia -     cloNIDine  (CATAPRES ) 0.1 MG tablet; TAKE 1/2 TABLET TWICE A DAY BY MOUTH -     gabapentin  (NEURONTIN ) 600 MG tablet; Take 1 tablet (600 mg total) by mouth 2 (two) times daily.  Restless legs syndrome -     cloNIDine  (CATAPRES ) 0.1 MG tablet; TAKE 1/2 TABLET TWICE A DAY BY MOUTH  Mild obstructive sleep apnea  Insomnia due to mental condition -     cloNIDine  (CATAPRES ) 0.1 MG tablet; TAKE 1/2 TABLET TWICE A DAY BY MOUTH   Anxiety is not sig better than last time with partial response to current meds:  . Intolerant of buspirone , and risperidone .  hx [facility] at Dr. Estrella Hench office.  Patient is also on opiates; buprenorphine .   Disc risk with Xanax .  We discussed the short-term risks associated with benzodiazepines including sedation and increased fall risk among others.  Discussed long-term side effect risk including dependence, potential withdrawal symptoms, and the potential eventual dose-related risk of dementia.  But recent studies from 2020 dispute this association between benzodiazepines and dementia risk. Newer studies in 2020 do not  support an association with dementia.   Options risperidone  off label, switch citalopram  to Viibryd, Luvox, off label Trileptal, venlafaxine, Abilify, higher than 40 citalopram  but EKG 01/14/20 QTc 512 so cannot increase citalopram . Also hesitant to use TCA for the same reason. She's afraid of change Option switch Xnax XR   Gabapentin  600 BID had to reduce bc cog SE at higher dose Would like to try lower dose at next RF, 400 mg BID when needed.  She'll let us  know. Disc WD  continue alprazolam  XR 0.5 mg twice daily BC Trial clonzepam 0.5 mg  BID instead  failed.  Continue citalopram  40 per Dr. Schuyler Custard Clonidine  0.1 mg 1/2 tablet twice daily prn sweats and anxiety off label.  Mild sleep apnea Rx dental appliance  (NAC) N-Acetylcysteine 2 of the  600 mg capsules daily to help with mild cognitive problems.  It can be combined with a B-complex vitamin as the B-12 and folate which can sometimes enhance the effect.  Consider modafinil if OSA  FU 6 mos  Nori Beat, MD, DFAPA   Please see After Visit Summary for patient specific instructions.  No future appointments.   No orders of the defined types were placed in this encounter.    -------------------------------

## 2024-01-31 ENCOUNTER — Other Ambulatory Visit: Payer: Self-pay | Admitting: Psychiatry

## 2024-01-31 DIAGNOSIS — F4001 Agoraphobia with panic disorder: Secondary | ICD-10-CM

## 2024-01-31 DIAGNOSIS — F411 Generalized anxiety disorder: Secondary | ICD-10-CM

## 2024-07-07 ENCOUNTER — Encounter: Payer: Self-pay | Admitting: Radiology

## 2024-07-24 ENCOUNTER — Ambulatory Visit: Admitting: Psychiatry

## 2024-07-24 ENCOUNTER — Encounter: Payer: Self-pay | Admitting: Psychiatry

## 2024-07-24 DIAGNOSIS — G2581 Restless legs syndrome: Secondary | ICD-10-CM

## 2024-07-24 DIAGNOSIS — G4733 Obstructive sleep apnea (adult) (pediatric): Secondary | ICD-10-CM | POA: Diagnosis not present

## 2024-07-24 DIAGNOSIS — F4001 Agoraphobia with panic disorder: Secondary | ICD-10-CM

## 2024-07-24 DIAGNOSIS — F5105 Insomnia due to other mental disorder: Secondary | ICD-10-CM

## 2024-07-24 DIAGNOSIS — F411 Generalized anxiety disorder: Secondary | ICD-10-CM

## 2024-07-24 MED ORDER — GABAPENTIN 400 MG PO TABS: 400.0000 mg | ORAL_TABLET | Freq: Three times a day (TID) | ORAL | 1 refills | Status: DC

## 2024-07-24 NOTE — Patient Instructions (Signed)
 Reduce gabapentin  at night to 400 mg and continue 600 mg in the AM for 1 week,  Then reduce gabapentin  to 400 mg twice daily.

## 2024-07-24 NOTE — Progress Notes (Signed)
 [name] [id number] [birth year] 53 y.o.  Subjective:   Patient ID:  [name] is a 53 y.o. (DOB Jan 14, 1971) female.  Chief Complaint:  Chief Complaint  Patient presents with   Follow-up    HPI [name] presents to the office today for follow-up of panic disorder, generalized anxiety, and insomnia.  First seen December 02, 2019. Referred by Dr. Ryan Hives.  She was on alprazolam  XR 0.5 mg twice daily and citalopram  40 mg daily along with opiates at the time. Buspirone  was initiated.  04/21/2020 appointment with the following noted: Covid and hosp for a week 12/26/19.  Got Remdesivir .  Finally recovering.  Still tired and aches. Steroids helped chronic pain from Lyme dz but relapsed off steroids. On opiates Bel[place]  still.  Still on Xanax  XR 0.5 mg BID, citalopram  40 mg daily.  Off buspirone  bc HA and shakey after a week. Still anxious about Covid.  Has to push herself to leave the house.  No full panic but panicky feelings. Still sees therapist Octaviano Monarch. Settled worker's comp case from 10 years ago. Celexa  does best of SSRI she's taken but not controlled. Normal QT interval now. Trouble with sleep sometimes. Plan: She prefers rispieridone 0.5 mg HS for a week then 1 tablet nightly if needed.  06/23/2020 appointment with the following noted: Too foggy and slowed down on risperidone  0.5 mg after a week's trial. So stopped it. Went to Post-Covid clinic yesterday with good antibodies.   Shouldn't worry over covid but still does.  Life in limbo after settled worker's comp. Exercising, pray, writing and therapy with [employer]. Can awaken with panic every 2-3 days.  Distracts herself. Awoke at 2AM with panic with fear of not breathing.  Anxiety driving.   Chronic fatigue and sleepy before and worse after post Covid  Plan:  Retry gabapentin   800 mg tablet 1/2 tablet QID up to 1 tablet QID  08/23/2020 appointment with the following noted: Increase gabapentin  to 800 mg QID.   Works good for 2 hours and then seems to wear off.   NO SE.  Asks about time release.  Helped with panicky feelings and pain.   Hx RLS Anxiety is improved but not gone as noted.  When it runs out still awakens with panic but able to go back to sleep.  04/14/21 appt noted: Hydroxyzine  for IC. Wants gabapentin  ER and needs PA to get it.  Has taken regular gabapentin  in it's place.  Regular gabapentin  will not keep her asleep and awakens about 5 AM feeling panicky after 5-6 hours of sleep.  Gabapentin  helps anxiety, pain and RLS but inadequate duration.  Has never taken gabapentin  ER but wants to take it for greater duration and to stop withdrawal sx from the short acting gabapentin . Gabapentin  helps ropinirole  help RLS but it wears off as noted.  Gabapentin  helps pain with better sleep until it wears off. Tremendous dental work repeatedly with dental problems causes anxiety.  [place] was causing dental problems.  3 more surgeries and then will be done.  Tooth pain and gum problems. Not sig depressed.  06/23/21 appt noted: PA denied for ER gabapentin  bc not on formulary. Taking gabapentin  800 QID instead. Still wakens at night with anxiety and thinking of all she needs to do.  Worrying for ahwile.  Avg 5-7 hours of sleep.  Family interferes some too. Can awaken with hear racing and feeling panicky. Not depressed but anxious. Plan: Trial mirtazapine 30  09/01/2021 appointment with  the following noted: Never got mirtazapine. 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  She does the night shifts with mother and then trouble sleeping  after it.  Parents are gettting better but still anxious.  A lot of stress.  Getting a couple of hours nap and then 4-5  hours at night.  Sleep is still irregular.  Some nights EFA.  Still RLS problems several nights per week when first tries to go to sleep about half the nights. No other med changes.   Asked about a girl in the news.   Plan:  Gabapentin  800 QID since can't get horizant  Continue citalopram  40 Hold mirtazapine 30 Clonidine  0.1 mg start 1/2 tablet at night and gradually increase to 1/2 in the AM and 1 at night for sleep and anxiety and perimenopausal  sweating off label.  12/26/2021 appointment with the following noted: Pending sleep repeat study bc witnessed loud snoring and periods of apnea and excessive daytime drowsiness regardless of sleep quantity. Witnessed in sleep clinic by staff while there with mother in sleep study.  Health issues..  Will talk to Dr. Clarice. When increased clonidine  to 0.1 mg HA so reduced to 0.05 mg HS Still sweats but not as heavy and BP better with clonidine . Helps care for M with pulm fibrosis and hip fx.  Stress with that.  Worry over mother.    03/27/22 appt noted:  Administrative px around getting sleep study.   Wants LA gabapentin  to see if can sleep later and also bc short acting gabapentin  causes diarrhea and hopes LA would control pain better. Sees pain doc. Anxiiety is not severe but varies with pain. Chronic pain associated with severe muscle pain assoc with history of Lyme dz and chronic abdominal pain.  Went to [employer] [facility] [employer] for 6 mos. Daytime drowsiness. Has to avoid caffeine bc heart racing. Needs to nap. CC sleepy, pain, and anxity M is sick and caring for her.  06/14/22 appt noted: Still waiting for sleep study. Still taking clonidine  0.05 mg BID helps anxiety and BP. Continues citalopram , gabapentin  800 mg TID, hydroxyzine  for IC, Xanax  XR 0.5 mg BID Still on opiate, Butrans  Still sleepy and tired ongoing and predates current meds.  Doesn't feel rested.  Had these sx for years with remote negative sleep study. Had shingles in face and ear.  Still a little pain on side of head. Plan no changes yet.  Pending sleep study  10/03/22 TC  Patient is requesting to titrate down on the Gabapentin  . She tried to cut in half without advisement, but she is not  having success.   Pt wants to decrease gabapentin  due to doing some research and finding that it can contribute to bladder problems ,which she already has.Also she noticed that she is not as sharp.She tried taking a half tab and it made her shaky,have headaches,and her pain level was out of control.    MD resp:  Yes she can trigger withdrawal symptoms if she drops the dose of gabapentin  too much at 1 time.  But it is not a difficult medicine to reduce if it is done gradually.  I sent in a prescription for 600 mg tablet instead of the 800 mg tablet and she takes 1 4 times a day and that transition should be fairly easy but give it about a week for her body to adjust.     11/27/22 appt noted: Psych med: Xanax  XR 0.5 BID, gabapentin  600  TID, citalopram  40, clonidine  0.1 HS, hydroxyzine  25 prn, ropinirole  2 HS Had WD with less gabapentin .  At 600 TID.  Was wanting to reduce bc cognitive fog.  Forgetting names and thought gabapentin  could be related. Does feel sharper with lower dose. Rx for pain and anxiety and did help anxiety.   Anxiety has crept up some with less gabapentin .   Sleep is pretty good.  Staying up later so can stay asleep.  Used to wake up in panic.   Struggling to lose wt.  Afraid to take Ozempic.  M pulm fiibrosis. Would like to later try reducing gabapentin .  04/11/23 appt noted: Psych med: Xanax  XR 0.5 BID, gabapentin  600 TID, citalopram  40, clonidine  0.1 HS, hydroxyzine  25 prn, ropinirole  2 HS PCP Dr. Clarice and then has alternative doctor with low cortisol and worries about it.  Her CRP is still high. Chronic anxiety.  Wonders if lab abnl could cause her ongoing anxiety.  Asks about adrenal fatigue from chronic anxiety.   Still awakens a lot.   Gabapentin  600 TID helped anxiety and sleep more at higher dose but had cog SE.  So reduced to BID.    Feels tired during the day. Therapist Dr. Bob Mylan. Takes buprenorphine  for all over pain, muscle pain from history Lyme Dz.  [facility]. RLS controlled with med.   Chronically sleepy during the day  01/16/24 appt noted: Psych med: Xanax  XR 0.5 BID &  citalopram  40, per Dr. Clarice, gabapentin  600 TID,  clonidine  0.05 HS, hydroxyzine  25 prn, ropinirole  2 HS M in hosp with CP and worrying about her.  Been up there taking care of her.  M thinks she's dying.   Anxiety overall is pretty good.  Health issues with bladder px.  Health issues will increase anxiety but did pretty well getting  through it.  No SE. RLS controlled  with med. Sleep variable.  Nonrestorative sleep.   Seeing alternative med doctor.   Some chronic medical worry over cortisol.   Taking a supplement.  But under more stress now with mother.   No med changes desired.    07/24/24 appt noted:  Psych med: Xanax  XR 0.5 BID &  citalopram  40, per Dr. Clarice, gabapentin  600 TID,  clonidine  0.5 HS, hydroxyzine  25 prn, ropinirole  2 HS Just had PE with Dr. Clarice.   She wonders if having SE Baclofen or gabapentin .  Much worse bladder leakage and wonders if it was med.  Anxiety is ok overall.  No changes desiered other than wants to try reducing gabapentin  to see if bladder leakage is better.  Caring for M at times and she is not doing well, immobile.  M pulmonary fibrosis.  F helps but live on edge keeping mother alive.  Past Psychiatric History: therapist [facility] Dr. Dansie. Dr. Jerrell Meeter [facility] Past Psychiatric Medication Trials:  Zoloft SE HA and itching,  Paxil wt gain 30# and fatigue,  Fluoxetine NR,  Lexapro  not as good as Celexa  Cymbalta SE dryness dental problems, no pain benefit Gabapentin  NR pain midrange, Horizant  Gabatril for sleep 16 mg   Alprazolam  1 mg BID,  Xanax  XR Clonazepam  0.5 mg BID failed  Buspirone  SE HA  Risperidone  SE Clonidine  0.1  Trazodone, NR Ambien and Lunesta NR Mirtazapine 30 never taken  Review of Systems:  Review of Systems  Constitutional:  Positive for fatigue.  HENT:  Positive for  dental problem.   Respiratory:  Positive for shortness of breath.  Cardiovascular:  Negative for chest pain and palpitations.  Genitourinary:  Positive for enuresis.  Musculoskeletal:  Positive for myalgias.  Neurological:  Positive for weakness. Negative for dizziness and tremors.       Restless legs  Psychiatric/Behavioral:  Positive for sleep disturbance. The patient is nervous/anxious.     Medications: I have reviewed the patient's current medications.  Current Outpatient Medications  Medication Sig Dispense Refill   acetaminophen  (TYLENOL ) 650 MG CR tablet Take by mouth.     albuterol  (PROAIR  HFA) 108 (90 Base) MCG/ACT inhaler Inhale 2 puffs into the lungs every 6 (six) hours as needed for wheezing or shortness of breath. 18 g 3   ALPRAZolam  (XANAX  XR) 0.5 MG 24 hr tablet Take 0.5 mg by mouth 2 (two) times daily.     baclofen (LIORESAL) 20 MG tablet Take 10 mg by mouth as needed.     bisoprolol  (ZEBETA ) 10 MG tablet Take 10 mg by mouth daily.      buprenorphine  (BUTRANS ) 15 MCG/HR Place onto the skin once a week.     cetirizine (ZYRTEC) 10 MG tablet Take 10 mg by mouth daily.      Cholecalciferol (VITAMIN D3 PO) Take 5,000 Int'l Units by mouth daily. With K     citalopram  (CELEXA ) 40 MG tablet Take 40 mg by mouth daily.     cloNIDine  (CATAPRES ) 0.1 MG tablet TAKE 1/2 TABLET TWICE A DAY BY MOUTH 90 tablet 1   fluticasone (FLONASE) 50 MCG/ACT nasal spray      Fluticasone Furoate  (ARNUITY ELLIPTA ) 50 MCG/ACT AEPB Inhale 1 puff into the lungs daily. 30 each 5   hydrOXYzine  (ATARAX /VISTARIL ) 25 MG tablet Take 25 mg by mouth as needed for anxiety.      Hyoscyamine Sulfate SL 0.125 MG SUBL Take 0.125 mg by mouth every 4 (four) hours as needed for cramping.     Melatonin 10 MG TABS Take 5 mg by mouth at bedtime.     Multiple Vitamin (MULTI-VITAMIN) tablet Take by mouth.     norethindrone  (MICRONOR ) 0.35 MG tablet Take 1 tablet (0.35 mg total) by mouth daily. 84 tablet 4   oxybutynin  (DITROPAN XL) 15 MG 24 hr tablet Take 1 tablet by mouth daily.     progesterone  (PROMETRIUM ) 200 MG capsule Take 200 mg by mouth at bedtime.     promethazine  (PHENERGAN ) 25 MG tablet Take 25 mg by mouth every 6 (six) hours as needed for nausea or vomiting.      rOPINIRole  (REQUIP ) 2 MG tablet Take 2 mg by mouth at bedtime.      Spacer/Aero-Holding Chambers (AEROCHAMBER MV) inhaler Use with inhaler 1 each 0   thyroid  (ARMOUR) 120 MG tablet Take by mouth.     vitamin B-12 (CYANOCOBALAMIN ) 500 MCG tablet Take 500 mcg by mouth daily.     zinc  sulfate, 50mg  elemental zinc , 220 (50 Zn) MG capsule Take by mouth.     gabapentin  (NEURONTIN ) 400 MG tablet Take 1 tablet (400 mg total) by mouth 3 (three) times daily. 90 tablet 1   prochlorperazine (COMPAZINE) 10 MG tablet Take 10 mg by mouth every 6 (six) hours as needed for nausea or vomiting.  (Patient not taking: Reported on 07/24/2024)     No current facility-administered medications for this visit.    Medication Side Effects: None  Allergies:  Allergies  Allergen Reactions   Allegra [Fexofenadine Hcl] Shortness Of Breath and Rash   Fexofenadine Shortness Of Breath, Rash and Other (See Comments)  Shakes, can't sleep, trouble breathing   Oseltamivir Other (See Comments)    Can't breathe, sick to stomach   Tamiflu [Oseltamivir Phosphate] Itching, Nausea And Vomiting and Other (See Comments)    Can't breathe, sick to stomach   Vancomycin Other (See Comments)    Red man's Syndrome.   Diphenhydramine  Other (See Comments)    Shakes, can't sleep, trouble breathing   Latex Itching, Dermatitis and Rash   Sulfamethoxazole Rash   Aspirin  Nausea And Vomiting   Clindamycin /Lincomycin Other (See Comments)    Reports it gave her cdiff- required a fecal transplant   Doxycycline    Other     NUT ALLERGY   Diphenhydramine  Hcl Palpitations and Rash   Morphine Other (See Comments)    REACTION: insomnia   Oseltamivir Phosphate Itching and Nausea And  Vomiting   Peanut-Containing Drug Products Rash   Penicillins Itching and Swelling    Can tolerate Amoxicillin  OK.   Sulfonamide Derivatives Rash    Past Medical History:  Diagnosis Date   Anxiety    Arrhythmia    heart races when pt in pain   Asthma    Clostridium difficile infection 2014   Epstein Barr virus infection    Fibromyalgia    GERD (gastroesophageal reflux disease)    History of COVID-19    Hypertension    Hypothyroidism    Immune deficiency disorder    Interstitial cystitis    Lyme disease    Mold contact confirmed    Toxic Mold syndrome   Mononucleosis 01/2009   Ovarian cyst    Serous Cystadenofibroma-Left ovary   PONV (postoperative nausea and vomiting)    Shortness of breath    on exertion   Systolic murmur    Thrombophlebitis    Thyroid  disease     Family History  Problem Relation Age of Onset   Rheum arthritis Mother    Hypertension Mother    Diabetes Mother    Pulmonary embolism Mother        x2   Heart disease Mother    Other Mother        factor 5   Pulmonary fibrosis Mother    Atrial fibrillation Mother    Hypertension Father    Thyroid  disease Father    Breast cancer Maternal Aunt        Age 68   Pancreatic cancer Maternal Grandmother    Heart disease Maternal Grandfather    Breast cancer Paternal Grandmother        Age 80   Asthma Paternal Grandmother     Social History   Socioeconomic History   Marital status: Single    Spouse name: Not on file   Number of children: 0   Years of education: college   Highest education level: Not on file  Occupational History   Occupation: Runner, Broadcasting/film/video  Tobacco Use   Smoking status: Never    Passive exposure: Never   Smokeless tobacco: Never  Vaping Use   Vaping status: Never Used  Substance and Sexual Activity   Alcohol use: No    Alcohol/week: 0.0 standard drinks of alcohol   Drug use: No   Sexual activity: Never    Partners: Male    Birth control/protection: Abstinence    Comment:  never sexually active, menarche 53yo  Other Topics Concern   Not on file  Social History Narrative   Lives at home alone.   Right-handed.   Caffeine use: 1 cup per day.   Social Drivers of  Health   Financial Resource Strain: Not on file  Food Insecurity: Low Risk  (12/03/2023)   Received from [facility]   Hunger Vital Sign    Within the past 12 months, the food you bought just didn't last and you didn't have money to get more. : Never true    Within the past 12 months, you worried that your food would run out before you got money to buy more: Never true  Transportation Needs: No Transportation Needs (12/03/2023)   Received from [facility]    In the past 12 months, has lack of reliable transportation kept you from medical appointments, meetings, work or from getting things needed for daily living? : No  Physical Activity: Not on file  Stress: Not on file  Social Connections: Not on file  Intimate Partner Violence: Not At Risk (11/04/2022)   Humiliation, Afraid, Rape, and Kick questionnaire    Fear of Current or Ex-Partner: No    Emotionally Abused: No    Physically Abused: No    Sexually Abused: No    Past Medical History, Surgical history, Social history, and Family history were reviewed and updated as appropriate.   Please see review of systems for further details on the patient's review from today.   Objective:   Physical Exam:  There were no vitals taken for this visit.  Physical Exam Constitutional:      General: She is not in acute distress.    Appearance: She is obese.  Musculoskeletal:        General: No deformity.  Neurological:     Mental Status: She is alert and oriented to person, place, and time.     Coordination: Coordination normal.  Psychiatric:        Attention and Perception: Attention and perception normal. She does not perceive auditory or visual hallucinations.        Mood and Affect: Mood is anxious. Mood is not depressed.  Affect is not blunt, tearful or inappropriate.        Speech: Speech normal. Speech is not slurred.        Behavior: Behavior normal. Behavior is not slowed.        Thought Content: Thought content normal. Thought content is not paranoid or delusional. Thought content does not include homicidal or suicidal ideation. Thought content does not include suicidal plan.        Cognition and Memory: Cognition and memory normal.        Judgment: Judgment normal.     Comments: Insight fair Chronic stress and anxiety ongoing including about meds  anxious      Lab Review:     Component Value Date/Time   NA 140 12/27/2023 1643   NA 140 01/23/2020 1202   NA 142 09/09/2013 1102   K 4.1 12/27/2023 1643   K 4.3 09/09/2013 1102   CL 102 12/27/2023 1643   CO2 26 12/27/2023 1643   CO2 27 09/09/2013 1102   GLUCOSE 93 12/27/2023 1643   GLUCOSE 95 09/09/2013 1102   BUN 14 12/27/2023 1643   BUN 12 01/23/2020 1202   BUN 11.0 09/09/2013 1102   CREATININE 0.98 12/27/2023 1643   CREATININE 0.79 11/07/2022 1410   CREATININE 0.9 09/09/2013 1102   CALCIUM  9.7 12/27/2023 1643   CALCIUM  9.9 09/09/2013 1102   PROT 7.4 11/07/2022 1410   PROT 7.1 01/23/2020 1202   PROT 7.9 09/09/2013 1102   ALBUMIN 4.1 11/07/2022 1410   ALBUMIN 4.2 01/23/2020 1202  ALBUMIN 3.5 09/09/2013 1102   AST 18 11/07/2022 1410   AST 19 09/09/2013 1102   ALT 14 11/07/2022 1410   ALT 18 09/09/2013 1102   ALKPHOS 60 11/07/2022 1410   ALKPHOS 80 09/09/2013 1102   BILITOT 0.8 11/07/2022 1410   BILITOT 0.53 09/09/2013 1102   GFRNONAA >60 12/27/2023 1643   GFRNONAA >60 11/07/2022 1410   GFRAA 92 01/23/2020 1202       Component Value Date/Time   WBC 9.8 12/27/2023 1643   RBC 4.45 12/27/2023 1643   HGB 11.9 (L) 12/27/2023 1643   HGB 13.4 11/07/2022 1410   HGB 12.7 01/23/2020 1202   HGB 13.9 09/09/2013 1102   HCT 37.0 12/27/2023 1643   HCT 39.5 01/23/2020 1202   HCT 40.9 09/09/2013 1102   PLT 273 12/27/2023 1643   PLT 289  11/07/2022 1410   PLT 332 01/23/2020 1202   MCV 83.1 12/27/2023 1643   MCV 88 01/23/2020 1202   MCV 89.1 09/09/2013 1102   MCH 26.7 12/27/2023 1643   MCHC 32.2 12/27/2023 1643   RDW 13.2 12/27/2023 1643   RDW 13.5 01/23/2020 1202   RDW 13.6 09/09/2013 1102   LYMPHSABS 3.0 12/27/2023 1643   LYMPHSABS 3.1 01/23/2020 1202   LYMPHSABS 3.2 09/09/2013 1102   MONOABS 0.5 12/27/2023 1643   MONOABS 0.5 09/09/2013 1102   EOSABS 0.3 12/27/2023 1643   EOSABS 0.4 01/23/2020 1202   BASOSABS 0.0 12/27/2023 1643   BASOSABS 0.1 01/23/2020 1202   BASOSABS 0.1 09/09/2013 1102    No results found for: POCLITH, LITHIUM   No results found for: PHENYTOIN, PHENOBARB, VALPROATE, CBMZ   .res Assessment: Plan:    Colie was seen today for follow-up.  Diagnoses and all orders for this visit:  Generalized anxiety disorder -     gabapentin  (NEURONTIN ) 400 MG tablet; Take 1 tablet (400 mg total) by mouth 3 (three) times daily.  Panic disorder with agoraphobia -     gabapentin  (NEURONTIN ) 400 MG tablet; Take 1 tablet (400 mg total) by mouth 3 (three) times daily.  Restless legs syndrome  Mild obstructive sleep apnea  Insomnia due to mental condition    Anxiety is better than last time with partial response to current meds:  . Intolerant of buspirone , and risperidone .  hx [facility] at Dr. Coni office.  Patient is also on opiates; buprenorphine .   Disc risk with Xanax .  We discussed the short-term risks associated with benzodiazepines including sedation and increased fall risk among others.  Discussed long-term side effect risk including dependence, potential withdrawal symptoms, and the potential eventual dose-related risk of dementia.  But recent studies from 2020 dispute this association between benzodiazepines and dementia risk. Newer studies in 2020 do not support an association with dementia.   Options risperidone  off label, switch citalopram  to Viibryd, Luvox, off label  Trileptal, venlafaxine, Abilify, higher than 40 citalopram  but EKG 01/14/20 QTc 512 so cannot increase citalopram . Also hesitant to use TCA for the same reason. She's afraid of change Option switch Xnax XR   Supportive therapy caretaking mother.  Reduce Gabapentin  400 mg TID when needed to see if less b ladder leakage is better.  She'll let us  know. Disc WD  continue alprazolam  XR 0.5 mg twice daily BC Trial clonzepam 0.5 mg  BID instead failed.  Continue citalopram  40 per Dr. Clarice Clonidine  0.1 mg 1/2 tablet twice daily prn sweats and anxiety off label.  Mild sleep apnea Rx dental appliance  (NAC) N-Acetylcysteine 2 of the  600 mg capsules daily to help with mild cognitive problems.  It can be combined with a B-complex vitamin as the B-12 and folate which can sometimes enhance the effect.  Consider modafinil if OSA  FU 3 mos  Lorene Macintosh, MD, DFAPA   Please see After Visit Summary for patient specific instructions.  No future appointments.   No orders of the defined types were placed in this encounter.    -------------------------------

## 2024-09-26 ENCOUNTER — Other Ambulatory Visit: Payer: Self-pay | Admitting: Psychiatry

## 2024-09-26 DIAGNOSIS — F4001 Agoraphobia with panic disorder: Secondary | ICD-10-CM

## 2024-09-26 DIAGNOSIS — F411 Generalized anxiety disorder: Secondary | ICD-10-CM

## 2024-09-26 NOTE — Telephone Encounter (Signed)
 Reduce gabapentin  at night to 400 mg and continue 600 mg in the AM for 1 week,  Then reduce gabapentin  to 400 mg twice daily.

## 2024-10-27 ENCOUNTER — Ambulatory Visit: Admitting: Psychiatry
